# Patient Record
Sex: Male | Born: 1945 | Race: White | Hispanic: No | Smoking: Former smoker
Health system: Southern US, Community
[De-identification: ages and names within clinical notes are randomized; demographics above are authoritative.]

## PROBLEM LIST (undated history)

## (undated) DIAGNOSIS — I252 Old myocardial infarction: Secondary | ICD-10-CM

## (undated) DIAGNOSIS — M109 Gout, unspecified: Secondary | ICD-10-CM

## (undated) DIAGNOSIS — I639 Cerebral infarction, unspecified: Secondary | ICD-10-CM

## (undated) DIAGNOSIS — E785 Hyperlipidemia, unspecified: Secondary | ICD-10-CM

## (undated) DIAGNOSIS — G4730 Sleep apnea, unspecified: Secondary | ICD-10-CM

## (undated) DIAGNOSIS — N400 Enlarged prostate without lower urinary tract symptoms: Secondary | ICD-10-CM

## (undated) DIAGNOSIS — R0600 Dyspnea, unspecified: Secondary | ICD-10-CM

## (undated) DIAGNOSIS — F32A Depression, unspecified: Secondary | ICD-10-CM

## (undated) DIAGNOSIS — E538 Deficiency of other specified B group vitamins: Secondary | ICD-10-CM

## (undated) DIAGNOSIS — I509 Heart failure, unspecified: Secondary | ICD-10-CM

## (undated) DIAGNOSIS — F419 Anxiety disorder, unspecified: Secondary | ICD-10-CM

## (undated) DIAGNOSIS — I10 Essential (primary) hypertension: Secondary | ICD-10-CM

## (undated) DIAGNOSIS — C801 Malignant (primary) neoplasm, unspecified: Secondary | ICD-10-CM

## (undated) DIAGNOSIS — R361 Hematospermia: Secondary | ICD-10-CM

## (undated) DIAGNOSIS — N189 Chronic kidney disease, unspecified: Secondary | ICD-10-CM

## (undated) DIAGNOSIS — K219 Gastro-esophageal reflux disease without esophagitis: Secondary | ICD-10-CM

## (undated) DIAGNOSIS — I4891 Unspecified atrial fibrillation: Secondary | ICD-10-CM

## (undated) DIAGNOSIS — F329 Major depressive disorder, single episode, unspecified: Secondary | ICD-10-CM

## (undated) HISTORY — PX: ANGIOPLASTY: SHX39

## (undated) HISTORY — DX: Cerebral infarction, unspecified: I63.9

## (undated) HISTORY — DX: Malignant (primary) neoplasm, unspecified: C80.1

## (undated) HISTORY — DX: Hematospermia: R36.1

## (undated) HISTORY — PX: BACK SURGERY: SHX140

## (undated) HISTORY — DX: Chronic kidney disease, unspecified: N18.9

## (undated) HISTORY — PX: CHOLECYSTECTOMY: SHX55

## (undated) HISTORY — DX: Essential (primary) hypertension: I10

## (undated) HISTORY — DX: Anxiety disorder, unspecified: F41.9

## (undated) HISTORY — DX: Gout, unspecified: M10.9

## (undated) HISTORY — DX: Gastro-esophageal reflux disease without esophagitis: K21.9

## (undated) HISTORY — DX: Deficiency of other specified B group vitamins: E53.8

## (undated) HISTORY — DX: Benign prostatic hyperplasia without lower urinary tract symptoms: N40.0

## (undated) HISTORY — DX: Major depressive disorder, single episode, unspecified: F32.9

## (undated) HISTORY — DX: Depression, unspecified: F32.A

## (undated) HISTORY — DX: Unspecified atrial fibrillation: I48.91

## (undated) HISTORY — DX: Hyperlipidemia, unspecified: E78.5

## (undated) HISTORY — DX: Old myocardial infarction: I25.2

## (undated) HISTORY — DX: Sleep apnea, unspecified: G47.30

---

## 1991-12-21 DIAGNOSIS — I252 Old myocardial infarction: Secondary | ICD-10-CM

## 1991-12-21 HISTORY — DX: Old myocardial infarction: I25.2

## 2012-01-13 DIAGNOSIS — R361 Hematospermia: Secondary | ICD-10-CM

## 2012-01-13 HISTORY — DX: Hematospermia: R36.1

## 2015-02-05 HISTORY — PX: CORONARY ARTERY BYPASS GRAFT: SHX141

## 2015-02-05 HISTORY — PX: SAPHENOUS VEIN GRAFT RESECTION: SHX2374

## 2015-02-09 LAB — CBC AND DIFFERENTIAL
HCT: 24 % — AB (ref 41–53)
HEMOGLOBIN: 7.9 g/dL — AB (ref 13.5–17.5)
Platelets: 110 10*3/uL — AB (ref 150–399)
WBC: 4.4 10^3/mL

## 2015-02-09 LAB — BASIC METABOLIC PANEL
BUN: 53 mg/dL — AB (ref 4–21)
Creatinine: 1.8 mg/dL — AB (ref 0.6–1.3)
Potassium: 4.2 mmol/L (ref 3.4–5.3)
Sodium: 140 mmol/L (ref 137–147)

## 2015-02-12 LAB — BASIC METABOLIC PANEL
BUN: 40 mg/dL — AB (ref 4–21)
CREATININE: 1.7 mg/dL — AB (ref 0.6–1.3)
Potassium: 3.9 mmol/L (ref 3.4–5.3)
SODIUM: 140 mmol/L (ref 137–147)

## 2015-02-12 LAB — CBC AND DIFFERENTIAL
HCT: 28 % — AB (ref 41–53)
Hemoglobin: 9.1 g/dL — AB (ref 13.5–17.5)
Platelets: 139 10*3/uL — AB (ref 150–399)
WBC: 5.2 10*3/mL

## 2015-02-12 LAB — POCT INR: INR: 1.1 (ref <=1.1)

## 2015-02-13 ENCOUNTER — Non-Acute Institutional Stay (SKILLED_NURSING_FACILITY): Payer: Medicare Other | Admitting: Internal Medicine

## 2015-02-13 DIAGNOSIS — I25708 Atherosclerosis of coronary artery bypass graft(s), unspecified, with other forms of angina pectoris: Secondary | ICD-10-CM | POA: Diagnosis not present

## 2015-02-13 DIAGNOSIS — I10 Essential (primary) hypertension: Secondary | ICD-10-CM

## 2015-02-13 DIAGNOSIS — N401 Enlarged prostate with lower urinary tract symptoms: Secondary | ICD-10-CM

## 2015-02-13 DIAGNOSIS — I214 Non-ST elevation (NSTEMI) myocardial infarction: Secondary | ICD-10-CM

## 2015-02-13 DIAGNOSIS — R338 Other retention of urine: Secondary | ICD-10-CM

## 2015-02-13 DIAGNOSIS — M1A00X0 Idiopathic chronic gout, unspecified site, without tophus (tophi): Secondary | ICD-10-CM | POA: Diagnosis not present

## 2015-02-13 DIAGNOSIS — E538 Deficiency of other specified B group vitamins: Secondary | ICD-10-CM | POA: Diagnosis not present

## 2015-02-13 DIAGNOSIS — I480 Paroxysmal atrial fibrillation: Secondary | ICD-10-CM | POA: Diagnosis not present

## 2015-02-13 DIAGNOSIS — Z85528 Personal history of other malignant neoplasm of kidney: Secondary | ICD-10-CM

## 2015-02-13 DIAGNOSIS — Z8673 Personal history of transient ischemic attack (TIA), and cerebral infarction without residual deficits: Secondary | ICD-10-CM | POA: Diagnosis not present

## 2015-02-13 DIAGNOSIS — N2889 Other specified disorders of kidney and ureter: Secondary | ICD-10-CM

## 2015-02-13 DIAGNOSIS — K219 Gastro-esophageal reflux disease without esophagitis: Secondary | ICD-10-CM

## 2015-02-13 DIAGNOSIS — F3340 Major depressive disorder, recurrent, in remission, unspecified: Secondary | ICD-10-CM | POA: Diagnosis not present

## 2015-02-13 DIAGNOSIS — N189 Chronic kidney disease, unspecified: Secondary | ICD-10-CM

## 2015-02-13 NOTE — Progress Notes (Signed)
Patient ID: Alexander Austin, male   DOB: 1946-08-13, 69 y.o.   MRN: [id number]    HISTORY AND PHYSICAL  Location:  [place] of Service: SNF (31)   No emergency contact information on file.  Advanced Directive information  DNR; COMFORT  Chief Complaint  Patient presents with  . New Admit To SNF    CAD s/p 3 vessel CABG, NSTEMI, hx CVA, CKD, hx right RCC s/p partial nephrectomy, B12 deficiency, PAF, urinary retention    HPI:  69 yo male seen today as a new admission for above. hospital records reviewed. He had a NSTEMI and left heart cath done which showed occlusion at proximal LAD, OM lesion and occluded RCA with occluded SVG RCA graft. He underwent a 3 vessel CABG. He has a prior hx partial right nephrectomy due to [place] and experienced urinary retention this admission. Foley cath inserted and he was told that he needed to f/u with urology after d/c.  Today, he has no c/o. Denies CP, SOB or palpitations. No new swelling in extremities. No d/c from sternal incision. He has began PT/OT and is tolerating tx. Appetite is excellent and he is sleeping well.  No nursing issues.  He has a hx CVA  and afib and is taking warfarin along with amiodarone, lipitor, and lisinopril. Edema is controlled with furosemide.  He takes B12 for hx B12 deficiency.  Depression is controlled on sertraline.  BPH is controlled on flomax    Past Medical History  Diagnosis Date  . Depression   . Anxiety   . Hypertension   . Gout   . A-fib   . GERD (gastroesophageal reflux disease)   . Hyperlipidemia   . Chronic kidney disease   . Cancer     Kidney  . CVA (cerebral infarction)   . Stroke     Past Surgical History  Procedure Laterality Date  . Coronary artery bypass graft  02/05/2015  . Back surgery    . Angioplasty      X 8   . Saphenous vein graft resection  02/05/15   Family History  Problem Relation Age of Onset  . Hypertension Mother   . Heart disease Mother    . Stroke Mother   . Hypertension Father      No care team member to display  History   Social History  . Marital Status: Married    Spouse Name: N/A  . Number of Children: N/A  . Years of Education: N/A   Social History Main Topics  . Smoking status: Former Smoker    Quit date: 07/24/1982  . Smokeless tobacco: Not on file  . Alcohol Use: No  . Drug Use: No  . Sexual Activity: No   Other Topics Concern  . Not on file   Past medical, surgical, family and social history reviewed   There is no immunization history on file for this patient.  Allergies  Allergen Reactions  . Imdur [Isosorbide Dinitrate] Other (See Comments)    Cardiovascular Arrest    Medications: Patient's Medications  New Prescriptions   No medications on file  Previous Medications   ALLOPURINOL (ZYLOPRIM) 100 MG TABLET    Take 100 mg by mouth daily.   AMIODARONE (PACERONE) 400 MG TABLET    Take 200 mg by mouth 2 (two) times daily. [name]   AMITRIPTYLINE (ELAVIL) 25 MG TABLET    Take 25 mg by mouth at bedtime.   AMLODIPINE (NORVASC) 10 MG  TABLET    Take 10 mg by mouth daily.   ASPIRIN 325 MG EC TABLET    Take 325 mg by mouth daily.   ATORVASTATIN (LIPITOR) 80 MG TABLET    Take 80 mg by mouth daily.   COLCHICINE 0.6 MG TABLET    Take 2 tablets once. Take 1 tablet 1 hour later, do not take more than 3 tablets in 3 days.   FUROSEMIDE (LASIX) 20 MG TABLET    Take 20 mg by mouth daily.   LISINOPRIL (PRINIVIL,ZESTRIL) 2.5 MG TABLET    Take 2.5 mg by mouth daily.   LOPERAMIDE (IMODIUM) 2 MG CAPSULE    Take 2 mg by mouth 3 (three) times daily.   METOPROLOL TARTRATE (LOPRESSOR) 25 MG TABLET    Take 1.5 tablets(37.5 mg total) by mouth every 6 hours.   PRIMIDONE (MYSOLINE) 50 MG TABLET    Take 100 mg by mouth 2 (two) times daily.   RANITIDINE (ZANTAC) 150 MG TABLET    Take 150 mg by mouth daily.   SACCHAROMYCES BOULARDII (FLORASTOR) 250 MG CAPSULE    Take 250 mg by mouth 2 (two) times daily.   SENNA-DOCUSATE  (SENOKOT-S) 8.6-50 MG PER TABLET    Take 2 tablets by mouth at bedtime as needed for mild constipation.   SERTRALINE (ZOLOFT) 100 MG TABLET    Take 150 mg by mouth daily.   TAMSULOSIN (FLOMAX) 0.4 MG CAPS CAPSULE    Take 0.8 mg by mouth daily after supper.   VITAMIN B-12 (CYANOCOBALAMIN) 500 MCG TABLET    Take 500 mcg by mouth daily.   WARFARIN (COUMADIN) 5 MG TABLET    Take 5 mg by mouth daily at 6 PM. Adjust dose to as directed to keep INR 2-3.  Modified Medications   No medications on file  Discontinued Medications   No medications on file    Review of Systems  Constitutional: Negative for chills, activity change and fatigue.  HENT: Negative for sore throat and trouble swallowing.   Eyes: Negative for visual disturbance.  Respiratory: Negative for cough, chest tightness and shortness of breath.   Cardiovascular: Negative for chest pain, palpitations and leg swelling.  Gastrointestinal: Negative for nausea, vomiting, abdominal pain and blood in stool.  Genitourinary: Negative for urgency, frequency and difficulty urinating.  Musculoskeletal: Negative for arthralgias and gait problem.  Skin: Negative for rash.  Neurological: Negative for weakness and headaches.  Psychiatric/Behavioral: Positive for dysphoric mood. Negative for confusion and sleep disturbance. The patient is not nervous/anxious.     Filed Vitals:   02/13/15 1636  BP: 145/58  Pulse: 71  Temp: 97.8 F (36.6 C)  SpO2: 97%   There is no height or weight on file to calculate BMI.  Physical Exam  Constitutional: He is oriented to person, place, and time. He appears well-developed and well-nourished.  Awake and alert in NAD  HENT:  Mouth/Throat: Oropharynx is clear and moist.  Eyes: Pupils are equal, round, and reactive to light. No scleral icterus.  Neck: Neck supple.  Cardiovascular: Normal rate, regular rhythm, normal heart sounds and intact distal pulses.  Exam reveals no gallop and no friction rub.   No  murmur heard. No carotid bruit b/l; no distal LE swelling   Pulmonary/Chest: Effort normal and breath sounds normal. He has no wheezes. He has no rales. He exhibits no tenderness.  ACW incision intact without secondary signs of infection   Abdominal: Soft. Bowel sounds are normal. He exhibits no distension, no abdominal bruit,  no pulsatile midline mass and no mass. There is no tenderness. There is no rebound and no guarding.  Genitourinary:  Foley cath intact with min particulates but otherwise yellow and DTG. No gross blood  Lymphadenopathy:    He has no cervical adenopathy.  Neurological: He is alert and oriented to person, place, and time. He has normal reflexes.  Skin: Skin is warm and dry. No rash noted.  Psychiatric: He has a normal mood and affect. His behavior is normal. Judgment and thought content normal.     Labs reviewed: CBC Latest Ref Rng 02/12/2015 02/09/2015  WBC - 5.2 4.4  Hemoglobin 13.5 - 17.5 g/dL 9.1(A) 7.9(A)  Hematocrit 41 - 53 % 28(A) 24(A)  Platelets 150 - 399 K/L 139(A) 110(A)    BMP Latest Ref Rng 02/12/2015 02/09/2015  BUN 4 - 21 mg/dL 40(A) 53(A)  Creatinine 0.6 - 1.3 mg/dL 1.7(A) 1.8(A)  Sodium 137 - 147 mmol/L 140 140  Potassium 3.4 - 5.3 mmol/L 3.9 4.2    INR 1.09  Mg 2.5   Hospital records reviewed- on 2/17th he was taken to the OR for sternotomy redo with 3 vessel CABG. Post op complicated by afib with RVR which resolved after amiodarone bolus and lopressor. HR returned to NSR. Peak Cr 3.01 but was 1.69 at d/c. Urinary retention of >500cc by bladder scan and foley cath inserted. He was d/c'd on flomax  Assessment/Plan   ICD-9-CM ICD-10-CM   1. Urinary retention due to benign prostatic hyperplasia - foley intact; on flomax 600.01 N28.89    788.29 R33.8   2. 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 - on ASA, lisinopril, metoprolol, lipitor 414.05 I25.708    413.9     s/p CABG x  3 vessel  3. NSTEMI (non-ST elevated myocardial infarction) - resolved 410.70 I21.4   4. PAF (paroxysmal atrial fibrillation) - rate controlled on metoprolol and amiodarone, coumadin for anticoagulation 427.31 I48.0   5. CKD (chronic kidney disease), unspecified stage - stable 585.9 N18.9   6. History of renal cell cancer V10.52 Z85.528    s/p partial nephrectomy  7. Vitamin B12 deficiency on oral supplement 266.2 E53.8   8. Major depressive disorder, recurrent, in remission - stable on sertraline 296.35 F33.40   9. History of stroke - on statin, ASA and coumadin V12.54 Z86.73   10. Gastroesophageal reflux disease without esophagitis - stable on zantac 530.81 K21.9   11. Essential hypertension, benign - controlled on amlodipine, lisinopril, metoprolol, furosemide 401.1 I10   12. Idiopathic chronic gout without tophus, unspecified site - stable on allopurinol and prn cochicine 274.02 [id number].[id number]     --Urology eval for foley cath, BPH  --f/u with cardiology and CT sx  --continue current medications  --follow PT/INR. Next draw today. GOAL INR 2-3. --PT/OT/ST as indicated  --GOAL: short term rehab and d/c home when medically appropriate. Communicated with pt and nursing.   Corey Laski S. Perlie Gold  [facility] [address] [place], [place] [address] [phone] Office (Wednesdays and Fridays 8 AM - 5 PM) [phone] Cell (Monday-Friday 8 AM - 5 PM)

## 2015-02-14 ENCOUNTER — Encounter: Payer: Self-pay | Admitting: *Deleted

## 2015-03-06 DIAGNOSIS — M1A9XX0 Chronic gout, unspecified, without tophus (tophi): Secondary | ICD-10-CM | POA: Insufficient documentation

## 2015-03-06 DIAGNOSIS — E538 Deficiency of other specified B group vitamins: Secondary | ICD-10-CM | POA: Insufficient documentation

## 2015-03-06 DIAGNOSIS — M109 Gout, unspecified: Secondary | ICD-10-CM | POA: Insufficient documentation

## 2015-03-06 DIAGNOSIS — I25708 Atherosclerosis of coronary artery bypass graft(s), unspecified, with other forms of angina pectoris: Secondary | ICD-10-CM | POA: Insufficient documentation

## 2015-03-06 DIAGNOSIS — Z8673 Personal history of transient ischemic attack (TIA), and cerebral infarction without residual deficits: Secondary | ICD-10-CM | POA: Insufficient documentation

## 2015-03-06 DIAGNOSIS — Z85528 Personal history of other malignant neoplasm of kidney: Secondary | ICD-10-CM | POA: Insufficient documentation

## 2015-03-06 DIAGNOSIS — F3340 Major depressive disorder, recurrent, in remission, unspecified: Secondary | ICD-10-CM | POA: Insufficient documentation

## 2015-03-06 DIAGNOSIS — I10 Essential (primary) hypertension: Secondary | ICD-10-CM | POA: Insufficient documentation

## 2015-03-06 DIAGNOSIS — R339 Retention of urine, unspecified: Secondary | ICD-10-CM | POA: Insufficient documentation

## 2015-03-06 DIAGNOSIS — I214 Non-ST elevation (NSTEMI) myocardial infarction: Secondary | ICD-10-CM | POA: Insufficient documentation

## 2015-03-06 DIAGNOSIS — N189 Chronic kidney disease, unspecified: Secondary | ICD-10-CM | POA: Insufficient documentation

## 2015-03-06 DIAGNOSIS — K219 Gastro-esophageal reflux disease without esophagitis: Secondary | ICD-10-CM | POA: Insufficient documentation

## 2015-03-06 DIAGNOSIS — I480 Paroxysmal atrial fibrillation: Secondary | ICD-10-CM | POA: Insufficient documentation

## 2015-03-11 ENCOUNTER — Encounter: Payer: Self-pay | Admitting: *Deleted

## 2015-03-13 ENCOUNTER — Non-Acute Institutional Stay (SKILLED_NURSING_FACILITY): Payer: Medicare Other | Admitting: Adult Health

## 2015-03-13 DIAGNOSIS — M1A00X0 Idiopathic chronic gout, unspecified site, without tophus (tophi): Secondary | ICD-10-CM | POA: Diagnosis not present

## 2015-03-13 DIAGNOSIS — Z7901 Long term (current) use of anticoagulants: Secondary | ICD-10-CM | POA: Diagnosis not present

## 2015-03-13 DIAGNOSIS — I10 Essential (primary) hypertension: Secondary | ICD-10-CM

## 2015-03-13 DIAGNOSIS — I25708 Atherosclerosis of coronary artery bypass graft(s), unspecified, with other forms of angina pectoris: Secondary | ICD-10-CM | POA: Diagnosis not present

## 2015-03-13 DIAGNOSIS — R251 Tremor, unspecified: Secondary | ICD-10-CM

## 2015-03-13 DIAGNOSIS — N400 Enlarged prostate without lower urinary tract symptoms: Secondary | ICD-10-CM | POA: Diagnosis not present

## 2015-03-13 DIAGNOSIS — R609 Edema, unspecified: Secondary | ICD-10-CM | POA: Diagnosis not present

## 2015-03-13 DIAGNOSIS — F3340 Major depressive disorder, recurrent, in remission, unspecified: Secondary | ICD-10-CM | POA: Diagnosis not present

## 2015-03-13 DIAGNOSIS — I480 Paroxysmal atrial fibrillation: Secondary | ICD-10-CM

## 2015-03-13 DIAGNOSIS — K219 Gastro-esophageal reflux disease without esophagitis: Secondary | ICD-10-CM

## 2015-03-19 ENCOUNTER — Encounter: Payer: Self-pay | Admitting: Internal Medicine

## 2015-03-20 ENCOUNTER — Non-Acute Institutional Stay (SKILLED_NURSING_FACILITY): Payer: Medicare Other | Admitting: Adult Health

## 2015-03-20 DIAGNOSIS — I214 Non-ST elevation (NSTEMI) myocardial infarction: Secondary | ICD-10-CM

## 2015-03-20 DIAGNOSIS — I25708 Atherosclerosis of coronary artery bypass graft(s), unspecified, with other forms of angina pectoris: Secondary | ICD-10-CM

## 2015-03-20 DIAGNOSIS — I10 Essential (primary) hypertension: Secondary | ICD-10-CM | POA: Diagnosis not present

## 2015-03-20 DIAGNOSIS — N400 Enlarged prostate without lower urinary tract symptoms: Secondary | ICD-10-CM | POA: Diagnosis not present

## 2015-03-23 ENCOUNTER — Other Ambulatory Visit: Admit: 2015-03-23 | Disposition: A | Discharge status: Home / Self Care | Payer: Self-pay

## 2015-03-23 LAB — CLOSTRIDIUM DIFFICILE(ARMC)

## 2015-04-16 DIAGNOSIS — Z7901 Long term (current) use of anticoagulants: Secondary | ICD-10-CM | POA: Insufficient documentation

## 2015-04-16 DIAGNOSIS — R609 Edema, unspecified: Secondary | ICD-10-CM | POA: Insufficient documentation

## 2015-04-16 DIAGNOSIS — N400 Enlarged prostate without lower urinary tract symptoms: Secondary | ICD-10-CM | POA: Insufficient documentation

## 2015-04-16 DIAGNOSIS — R251 Tremor, unspecified: Secondary | ICD-10-CM | POA: Insufficient documentation

## 2015-04-16 NOTE — Progress Notes (Signed)
Patient ID: Alexander Austin, male   DOB: 06/01/1946, 69 y.o.   MRN: [id number]  [facility]     Allergies  Allergen Reactions  . Imdur [Isosorbide Dinitrate] Other (See Comments)    Cardiovascular Arrest       Chief Complaint  Patient presents with  . Medical Management of Chronic Issues    HPI:  He is a long term resident of this facility being seen for the management of his chronic illnesses. He status is stable overall he is not voicing any concerns or complaints at this time. There are no nursing concerns at this time.    Past Medical History  Diagnosis Date  . Depression   . Anxiety   . Hypertension   . Gout   . A-fib   . GERD (gastroesophageal reflux disease)   . Hyperlipidemia   . Chronic kidney disease   . Cancer     Kidney  . CVA (cerebral infarction)   . Stroke   . History of myocardial infarction 1993  . Benign prostate hyperplasia   . Vitamin B 12 deficiency   . Hematospermia 01/13/2012  . Sleep apnea     Past Surgical History  Procedure Laterality Date  . Coronary artery bypass graft  02/05/2015  . Back surgery    . Angioplasty      X 8   . Saphenous vein graft resection  02/05/15    VITAL SIGNS BP 144/75 mmHg  Pulse 62  Ht 6\' 1"  (1.854 m)  Wt 215 lb (97.523 kg)  BMI 28.37 kg/m2   Outpatient Encounter Prescriptions as of 03/13/2015  Medication Sig  . allopurinol (ZYLOPRIM) 100 MG tablet Take 100 mg by mouth daily.  [name] amiodarone (PACERONE) 400 MG tablet Take 200 mg by mouth 2 (two) times daily. .  . amitriptyline (ELAVIL) 25 MG tablet Take 25 mg by mouth at bedtime.  [name] amLODipine (NORVASC) 10 MG tablet Take 10 mg by mouth daily.  [name] aspirin 325 MG EC tablet Take 325 mg by mouth daily.  [name] atorvastatin (LIPITOR) 80 MG tablet Take 80 mg by mouth daily.  . colchicine 0.6 MG tablet Take 2 tablets once. Take 1 tablet 1 hour later, do not take more than 3 tablets in 3 days.  . furosemide (LASIX) 20 MG tablet Take 20 mg by mouth daily.  [name] lisinopril  (PRINIVIL,ZESTRIL) 2.5 MG tablet Take 2.5 mg by mouth daily.  [name] loperamide (IMODIUM) 2 MG capsule Take 2 mg by mouth 3 (three) times daily.  . metoprolol tartrate (LOPRESSOR) 25 MG tablet Take 1.5 tablets(37.5 mg total) by mouth every 6 hours.  . primidone (MYSOLINE) 50 MG tablet Take 100 mg by mouth 2 (two) times daily.  . ranitidine (ZANTAC) 150 MG tablet Take 150 mg by mouth daily.  [name] saccharomyces boulardii (FLORASTOR) 250 MG capsule Take 250 mg by mouth 2 (two) times daily.  [name] senna-docusate (SENOKOT-S) 8.6-50 MG per tablet Take 2 tablets by mouth at bedtime as needed for mild constipation.  . sertraline (ZOLOFT) 100 MG tablet Take 150 mg by mouth daily.  . tamsulosin (FLOMAX) 0.4 MG CAPS capsule Take 0.8 mg by mouth daily after supper.  . vitamin B-12 (CYANOCOBALAMIN) 500 MCG tablet Take 500 mcg by mouth daily.  [name] warfarin (COUMADIN) 5 MG tablet Take 6 mg by mouth daily at 6 PM. Adjust dose to as directed to keep INR 2-3.     SIGNIFICANT DIAGNOSTIC EXAMS    Review of Systems  Constitutional: Negative for  malaise/fatigue.  Respiratory: Negative for cough and shortness of breath.   Cardiovascular: Negative for chest pain, palpitations and leg swelling.  Gastrointestinal: Negative for heartburn, abdominal pain and constipation.  Genitourinary:       His foley has been removed   Musculoskeletal: Negative for myalgias and joint pain.  Skin: Negative.   Psychiatric/Behavioral: Negative for depression. The patient is not nervous/anxious.      Physical Exam  Constitutional: He is oriented to person, place, and time. He appears well-developed and well-nourished. No distress.  Neck: Neck supple. No JVD present. No thyromegaly present.  Cardiovascular: Normal rate, regular rhythm and intact distal pulses.   Respiratory: Effort normal and breath sounds normal. No respiratory distress.  GI: Soft. Bowel sounds are normal. He exhibits no distension. There is no tenderness.    Musculoskeletal: He exhibits no edema.  Is able to move extremities   Neurological: He is alert and oriented to person, place, and time.  Skin: Skin is warm and dry. He is not diaphoretic.       ASSESSMENT/ PLAN:  1. Afib: his heart rate is stable; is on amiodarone 200 mg daily for rate control; is on lopressor 37.5 mg every 6 hours for rate control;  And asa 325 mg daily   2. Anticoagulation management: for his inr of 3.7 will hold coumadin for 2 days then begin coumadin 5 mg daily and will check inr on 03-17-15.   3. Hypertension: will continue norvasc 10 mg daily; lopressor 37.5 mg every 6 hours; lisinopril 2.5 mg daily   4. Dyslipidemia: will continue lipitor 80 mg daily  5. Gout: no recent flares; will continue allopurinol 100 mg daily; and has colchicine 0.6 mg 2 tabs then 1 tab one hour later as needed  6. Gerd: will continue zantac 150 mg daily does take imodium 2 mg prior to meals   7. Depression: is stable at this time; will continue zoloft 150 mg daily   8. BPH: his foley has removed is voiding; will continue flomax 0.8 mg daily  9. Edema: will continue lasix 20 mg daily   10. Tremor: will continue primidone 100 mg twice daily   11. CAD: no complaints of chest pain present; is status post MI; will continue asa 325 mg daily     Time spent with patient 45 minutes.     Ok Edwards NP [facility]  Contact [phone] Monday through Friday 8am- 5pm  After hours call [phone]

## 2015-05-15 NOTE — Progress Notes (Signed)
Patient ID: YURIY BROSEY, male   DOB: 1945-12-30, 69 y.o.   MRN: [id number]  [facility]     Allergies  Allergen Reactions  . Imdur [Isosorbide Dinitrate] Other (See Comments)    Cardiovascular Arrest       Chief Complaint  Patient presents with  . Discharge Note    HPI:  He is being discharged to another snf. He will not need any home health; or dme. He will not require any prescriptions to be written. He will be followed medically by the medical staff at the facility.   Past Medical History  Diagnosis Date  . Depression   . Anxiety   . Hypertension   . Gout   . A-fib   . GERD (gastroesophageal reflux disease)   . Hyperlipidemia   . Chronic kidney disease   . Cancer     Kidney  . CVA (cerebral infarction)   . Stroke   . History of myocardial infarction 1993  . Benign prostate hyperplasia   . Vitamin B 12 deficiency   . Hematospermia 01/13/2012  . Sleep apnea     Past Surgical History  Procedure Laterality Date  . Coronary artery bypass graft  02/05/2015  . Back surgery    . Angioplasty      X 8   . Saphenous vein graft resection  02/05/15    VITAL SIGNS BP 130/72 mmHg  Pulse 60  Ht 6\' 1"  (1.854 m)  Wt 212 lb (96.163 kg)  BMI 27.98 kg/m2  SpO2 98%   Outpatient Encounter Prescriptions as of 03/20/2015  Medication Sig  . allopurinol (ZYLOPRIM) 100 MG tablet Take 100 mg by mouth daily.  [name] amiodarone (PACERONE) 400 MG tablet Take 200 mg by mouth 2 (two) times daily. .  . amitriptyline (ELAVIL) 25 MG tablet Take 25 mg by mouth at bedtime.  [name] amLODipine (NORVASC) 10 MG tablet Take 10 mg by mouth daily.  [name] aspirin 325 MG EC tablet Take 325 mg by mouth daily.  [name] atorvastatin (LIPITOR) 80 MG tablet Take 80 mg by mouth daily.  . colchicine 0.6 MG tablet Take 2 tablets once. Take 1 tablet 1 hour later, do not take more than 3 tablets in 3 days.  . furosemide (LASIX) 20 MG tablet Take 20 mg by mouth daily.  [name] lisinopril (PRINIVIL,ZESTRIL) 2.5 MG tablet Take 2.5  mg by mouth daily.  [name] loperamide (IMODIUM) 2 MG capsule Take 2 mg by mouth 3 (three) times daily.  . metoprolol tartrate (LOPRESSOR) 25 MG tablet Take 1.5 tablets(37.5 mg total) by mouth every 6 hours.  . primidone (MYSOLINE) 50 MG tablet Take 100 mg by mouth 2 (two) times daily.  . ranitidine (ZANTAC) 150 MG tablet Take 150 mg by mouth daily.  [name] saccharomyces boulardii (FLORASTOR) 250 MG capsule Take 250 mg by mouth 2 (two) times daily.  [name] senna-docusate (SENOKOT-S) 8.6-50 MG per tablet Take 2 tablets by mouth at bedtime as needed for mild constipation.  . sertraline (ZOLOFT) 100 MG tablet Take 150 mg by mouth daily.  . tamsulosin (FLOMAX) 0.4 MG CAPS capsule Take 0.8 mg by mouth daily after supper.  . vitamin B-12 (CYANOCOBALAMIN) 500 MCG tablet Take 500 mcg by mouth daily.  [name] warfarin (COUMADIN) 5 MG tablet Take 5 mg by mouth daily at 6 PM. Adjust dose to as directed to keep INR 2-3.      SIGNIFICANT DIAGNOSTIC EXAMS        ROS Constitutional: Negative for malaise/fatigue.  Respiratory: Negative  for cough and shortness of breath.   Cardiovascular: Negative for chest pain, palpitations and leg swelling.  Gastrointestinal: Negative for heartburn, abdominal pain and constipation.  Musculoskeletal: Negative for myalgias and joint pain.  Skin: Negative.   Psychiatric/Behavioral: Negative for depression. The patient is not nervous/anxious.      Physical Exam Constitutional: He is oriented to person, place, and time. He appears well-developed and well-nourished. No distress.  Neck: Neck supple. No JVD present. No thyromegaly present.  Cardiovascular: Normal rate, regular rhythm and intact distal pulses.   Respiratory: Effort normal and breath sounds normal. No respiratory distress.  GI: Soft. Bowel sounds are normal. He exhibits no distension. There is no tenderness.  Musculoskeletal: He exhibits no edema.  Is able to move extremities   Neurological: He is alert and oriented to  person, place, and time.  Skin: Skin is warm and dry. He is not diaphoretic.      ASSESSMENT/ PLAN:  Will discharge him to snf; he will not need home health; will not need dme. He will not require any prescriptions to be written.   Time spent with patient 15 minutes.    Ok Edwards NP [facility]  Contact [phone] Monday through Friday 8am- 5pm  After hours call [phone]

## 2018-06-28 ENCOUNTER — Telehealth: Payer: Self-pay

## 2018-06-28 NOTE — Telephone Encounter (Signed)
Pt appears on [facility] Quality Report for [facility].  I called the pt and left a message asking for a call back to [phone] confirm their PCP.  VDM (DD)

## 2018-10-23 ENCOUNTER — Telehealth: Payer: Self-pay

## 2018-10-23 NOTE — Telephone Encounter (Signed)
error 

## 2020-03-07 ENCOUNTER — Other Ambulatory Visit: Payer: Self-pay

## 2020-03-07 ENCOUNTER — Encounter (HOSPITAL_BASED_OUTPATIENT_CLINIC_OR_DEPARTMENT_OTHER): Payer: Self-pay | Admitting: Emergency Medicine

## 2020-03-07 ENCOUNTER — Emergency Department (HOSPITAL_BASED_OUTPATIENT_CLINIC_OR_DEPARTMENT_OTHER): Payer: No Typology Code available for payment source

## 2020-03-07 ENCOUNTER — Inpatient Hospital Stay (HOSPITAL_BASED_OUTPATIENT_CLINIC_OR_DEPARTMENT_OTHER)
Admission: EM | Admit: 2020-03-07 | Discharge: 2020-03-10 | DRG: 554 | Disposition: A | Discharge status: Home / Self Care | Payer: No Typology Code available for payment source | Attending: Internal Medicine | Admitting: Internal Medicine

## 2020-03-07 DIAGNOSIS — N289 Disorder of kidney and ureter, unspecified: Secondary | ICD-10-CM

## 2020-03-07 DIAGNOSIS — N400 Enlarged prostate without lower urinary tract symptoms: Secondary | ICD-10-CM | POA: Diagnosis present

## 2020-03-07 DIAGNOSIS — R251 Tremor, unspecified: Secondary | ICD-10-CM | POA: Diagnosis not present

## 2020-03-07 DIAGNOSIS — Z6830 Body mass index (BMI) 30.0-30.9, adult: Secondary | ICD-10-CM

## 2020-03-07 DIAGNOSIS — M10272 Drug-induced gout, left ankle and foot: Secondary | ICD-10-CM | POA: Diagnosis not present

## 2020-03-07 DIAGNOSIS — I255 Ischemic cardiomyopathy: Secondary | ICD-10-CM | POA: Diagnosis present

## 2020-03-07 DIAGNOSIS — N189 Chronic kidney disease, unspecified: Secondary | ICD-10-CM

## 2020-03-07 DIAGNOSIS — N179 Acute kidney failure, unspecified: Secondary | ICD-10-CM | POA: Diagnosis present

## 2020-03-07 DIAGNOSIS — R531 Weakness: Secondary | ICD-10-CM

## 2020-03-07 DIAGNOSIS — E538 Deficiency of other specified B group vitamins: Secondary | ICD-10-CM | POA: Diagnosis not present

## 2020-03-07 DIAGNOSIS — I25810 Atherosclerosis of coronary artery bypass graft(s) without angina pectoris: Secondary | ICD-10-CM | POA: Diagnosis not present

## 2020-03-07 DIAGNOSIS — I252 Old myocardial infarction: Secondary | ICD-10-CM

## 2020-03-07 DIAGNOSIS — E669 Obesity, unspecified: Secondary | ICD-10-CM | POA: Diagnosis not present

## 2020-03-07 DIAGNOSIS — G4733 Obstructive sleep apnea (adult) (pediatric): Secondary | ICD-10-CM | POA: Diagnosis not present

## 2020-03-07 DIAGNOSIS — N184 Chronic kidney disease, stage 4 (severe): Secondary | ICD-10-CM | POA: Diagnosis present

## 2020-03-07 DIAGNOSIS — Z7902 Long term (current) use of antithrombotics/antiplatelets: Secondary | ICD-10-CM

## 2020-03-07 DIAGNOSIS — M7732 Calcaneal spur, left foot: Secondary | ICD-10-CM | POA: Diagnosis present

## 2020-03-07 DIAGNOSIS — R0602 Shortness of breath: Secondary | ICD-10-CM | POA: Diagnosis not present

## 2020-03-07 DIAGNOSIS — N1831 Chronic kidney disease, stage 3a: Secondary | ICD-10-CM

## 2020-03-07 DIAGNOSIS — F3340 Major depressive disorder, recurrent, in remission, unspecified: Secondary | ICD-10-CM | POA: Diagnosis not present

## 2020-03-07 DIAGNOSIS — K219 Gastro-esophageal reflux disease without esophagitis: Secondary | ICD-10-CM | POA: Diagnosis present

## 2020-03-07 DIAGNOSIS — M10072 Idiopathic gout, left ankle and foot: Secondary | ICD-10-CM | POA: Diagnosis not present

## 2020-03-07 DIAGNOSIS — E869 Volume depletion, unspecified: Secondary | ICD-10-CM | POA: Diagnosis present

## 2020-03-07 DIAGNOSIS — I480 Paroxysmal atrial fibrillation: Secondary | ICD-10-CM | POA: Diagnosis not present

## 2020-03-07 DIAGNOSIS — I10 Essential (primary) hypertension: Secondary | ICD-10-CM | POA: Diagnosis present

## 2020-03-07 DIAGNOSIS — Z20822 Contact with and (suspected) exposure to covid-19: Secondary | ICD-10-CM | POA: Diagnosis present

## 2020-03-07 DIAGNOSIS — Z8673 Personal history of transient ischemic attack (TIA), and cerebral infarction without residual deficits: Secondary | ICD-10-CM

## 2020-03-07 DIAGNOSIS — E785 Hyperlipidemia, unspecified: Secondary | ICD-10-CM | POA: Diagnosis not present

## 2020-03-07 DIAGNOSIS — M109 Gout, unspecified: Principal | ICD-10-CM | POA: Diagnosis present

## 2020-03-07 DIAGNOSIS — I129 Hypertensive chronic kidney disease with stage 1 through stage 4 chronic kidney disease, or unspecified chronic kidney disease: Secondary | ICD-10-CM | POA: Diagnosis not present

## 2020-03-07 DIAGNOSIS — Z789 Other specified health status: Secondary | ICD-10-CM

## 2020-03-07 DIAGNOSIS — Z7901 Long term (current) use of anticoagulants: Secondary | ICD-10-CM

## 2020-03-07 DIAGNOSIS — Z8249 Family history of ischemic heart disease and other diseases of the circulatory system: Secondary | ICD-10-CM

## 2020-03-07 DIAGNOSIS — Z823 Family history of stroke: Secondary | ICD-10-CM

## 2020-03-07 DIAGNOSIS — Z85528 Personal history of other malignant neoplasm of kidney: Secondary | ICD-10-CM | POA: Diagnosis not present

## 2020-03-07 DIAGNOSIS — E66811 Obesity, class 1: Secondary | ICD-10-CM

## 2020-03-07 DIAGNOSIS — I2510 Atherosclerotic heart disease of native coronary artery without angina pectoris: Secondary | ICD-10-CM | POA: Diagnosis present

## 2020-03-07 DIAGNOSIS — M79673 Pain in unspecified foot: Secondary | ICD-10-CM

## 2020-03-07 DIAGNOSIS — Z888 Allergy status to other drugs, medicaments and biological substances status: Secondary | ICD-10-CM

## 2020-03-07 DIAGNOSIS — I25708 Atherosclerosis of coronary artery bypass graft(s), unspecified, with other forms of angina pectoris: Secondary | ICD-10-CM

## 2020-03-07 DIAGNOSIS — F419 Anxiety disorder, unspecified: Secondary | ICD-10-CM | POA: Diagnosis present

## 2020-03-07 DIAGNOSIS — Z87891 Personal history of nicotine dependence: Secondary | ICD-10-CM

## 2020-03-07 LAB — CBC WITH DIFFERENTIAL/PLATELET
Abs Immature Granulocytes: 0.02 10*3/uL (ref 0.00–0.07)
Basophils Absolute: 0 10*3/uL (ref 0.0–0.1)
Basophils Relative: 0 %
Eosinophils Absolute: 0.1 10*3/uL (ref 0.0–0.5)
Eosinophils Relative: 1 %
HCT: 47.3 % (ref 39.0–52.0)
Hemoglobin: 15.7 g/dL (ref 13.0–17.0)
Immature Granulocytes: 0 %
Lymphocytes Relative: 11 %
Lymphs Abs: 0.6 10*3/uL — ABNORMAL LOW (ref 0.7–4.0)
MCH: 29 pg (ref 26.0–34.0)
MCHC: 33.2 g/dL (ref 30.0–36.0)
MCV: 87.4 fL (ref 80.0–100.0)
Monocytes Absolute: 0.3 10*3/uL (ref 0.1–1.0)
Monocytes Relative: 6 %
Neutro Abs: 4.4 10*3/uL (ref 1.7–7.7)
Neutrophils Relative %: 82 %
Platelets: 150 10*3/uL (ref 150–400)
RBC: 5.41 MIL/uL (ref 4.22–5.81)
RDW: 15.8 % — ABNORMAL HIGH (ref 11.5–15.5)
WBC: 5.4 10*3/uL (ref 4.0–10.5)
nRBC: 0 % (ref 0.0–0.2)

## 2020-03-07 LAB — URINALYSIS, MICROSCOPIC (REFLEX)

## 2020-03-07 LAB — BASIC METABOLIC PANEL
Anion gap: 10 (ref 5–15)
BUN: 34 mg/dL — ABNORMAL HIGH (ref 8–23)
CO2: 23 mmol/L (ref 22–32)
Calcium: 8.9 mg/dL (ref 8.9–10.3)
Chloride: 104 mmol/L (ref 98–111)
Creatinine, Ser: 2.17 mg/dL — ABNORMAL HIGH (ref 0.61–1.24)
GFR calc Af Amer: 34 mL/min — ABNORMAL LOW (ref >60)
GFR calc non Af Amer: 29 mL/min — ABNORMAL LOW (ref >60)
Glucose, Bld: 118 mg/dL — ABNORMAL HIGH (ref 70–99)
Potassium: 4.3 mmol/L (ref 3.5–5.1)
Sodium: 137 mmol/L (ref 135–145)

## 2020-03-07 LAB — URINALYSIS, ROUTINE W REFLEX MICROSCOPIC
Bilirubin Urine: NEGATIVE
Glucose, UA: NEGATIVE mg/dL
Ketones, ur: NEGATIVE mg/dL
Leukocytes,Ua: NEGATIVE
Nitrite: NEGATIVE
Protein, ur: NEGATIVE mg/dL
Specific Gravity, Urine: 1.025 (ref 1.005–1.030)
pH: 5.5 (ref 5.0–8.0)

## 2020-03-07 LAB — SARS CORONAVIRUS 2 (TAT 6-24 HRS): SARS Coronavirus 2: NEGATIVE

## 2020-03-07 LAB — BRAIN NATRIURETIC PEPTIDE: B Natriuretic Peptide: 106.1 pg/mL — ABNORMAL HIGH (ref 0.0–100.0)

## 2020-03-07 MED ORDER — ENOXAPARIN SODIUM 40 MG/0.4ML ~~LOC~~ SOLN: 40.0000 mg | SUBCUTANEOUS | Status: DC

## 2020-03-07 MED ORDER — METHYLPREDNISOLONE SODIUM SUCC 125 MG IJ SOLR
125.0000 mg | Freq: Once | INTRAMUSCULAR | Status: AC
  Administered 2020-03-07: 125 mg via INTRAVENOUS
  Filled 2020-03-07: qty 2

## 2020-03-07 MED ORDER — PRIMIDONE 50 MG PO TABS
50.0000 mg | ORAL_TABLET | Freq: Two times a day (BID) | ORAL | Status: DC
  Administered 2020-03-08 – 2020-03-10 (×6): 50 mg via ORAL
  Filled 2020-03-07 (×6): qty 1

## 2020-03-07 MED ORDER — ACETAMINOPHEN 325 MG PO TABS
650.0000 mg | ORAL_TABLET | Freq: Four times a day (QID) | ORAL | Status: DC | PRN
  Administered 2020-03-08: 650 mg via ORAL
  Filled 2020-03-07: qty 2

## 2020-03-07 MED ORDER — CLOPIDOGREL BISULFATE 75 MG PO TABS
75.0000 mg | ORAL_TABLET | Freq: Every day | ORAL | Status: DC
  Administered 2020-03-07 – 2020-03-10 (×4): 75 mg via ORAL
  Filled 2020-03-07 (×4): qty 1

## 2020-03-07 MED ORDER — SERTRALINE HCL 25 MG PO TABS
25.0000 mg | ORAL_TABLET | Freq: Every day | ORAL | Status: DC
  Administered 2020-03-08 – 2020-03-09 (×3): 25 mg via ORAL
  Filled 2020-03-07 (×3): qty 1

## 2020-03-07 MED ORDER — CALCITRIOL 0.25 MCG PO CAPS
0.2500 ug | ORAL_CAPSULE | Freq: Every day | ORAL | Status: DC
  Administered 2020-03-08 – 2020-03-10 (×3): 0.25 ug via ORAL
  Filled 2020-03-07 (×3): qty 1

## 2020-03-07 MED ORDER — ATORVASTATIN CALCIUM 40 MG PO TABS
80.0000 mg | ORAL_TABLET | Freq: Every day | ORAL | Status: DC
Start: 2020-03-07 — End: 2020-03-10
  Administered 2020-03-07 – 2020-03-09 (×4): 80 mg via ORAL
  Filled 2020-03-07 (×4): qty 2

## 2020-03-07 MED ORDER — CARVEDILOL 25 MG PO TABS
25.0000 mg | ORAL_TABLET | Freq: Two times a day (BID) | ORAL | Status: DC
  Administered 2020-03-07 – 2020-03-10 (×7): 25 mg via ORAL
  Filled 2020-03-07 (×4): qty 1
  Filled 2020-03-07: qty 4
  Filled 2020-03-07 (×2): qty 1

## 2020-03-07 MED ORDER — HYDROCODONE-ACETAMINOPHEN 5-325 MG PO TABS: 1.0000 | ORAL_TABLET | ORAL | Status: DC | PRN

## 2020-03-07 MED ORDER — AMLODIPINE BESYLATE 10 MG PO TABS
10.0000 mg | ORAL_TABLET | Freq: Every day | ORAL | Status: DC
  Administered 2020-03-07 – 2020-03-10 (×4): 10 mg via ORAL
  Filled 2020-03-07: qty 1
  Filled 2020-03-07: qty 2
  Filled 2020-03-07 (×2): qty 1

## 2020-03-07 MED ORDER — HYDRALAZINE HCL 10 MG PO TABS
10.0000 mg | ORAL_TABLET | Freq: Three times a day (TID) | ORAL | Status: DC
  Administered 2020-03-07 – 2020-03-10 (×10): 10 mg via ORAL
  Filled 2020-03-07 (×14): qty 1

## 2020-03-07 MED ORDER — ONDANSETRON HCL 4 MG PO TABS: 4.0000 mg | ORAL_TABLET | Freq: Four times a day (QID) | ORAL | Status: DC | PRN

## 2020-03-07 MED ORDER — PANTOPRAZOLE SODIUM 40 MG PO TBEC
40.0000 mg | DELAYED_RELEASE_TABLET | Freq: Every day | ORAL | Status: DC
  Administered 2020-03-07 – 2020-03-10 (×4): 40 mg via ORAL
  Filled 2020-03-07 (×4): qty 1

## 2020-03-07 MED ORDER — ONDANSETRON HCL 4 MG/2ML IJ SOLN: 4.0000 mg | Freq: Four times a day (QID) | INTRAMUSCULAR | Status: DC | PRN

## 2020-03-07 MED ORDER — ACETAMINOPHEN 650 MG RE SUPP: 650.0000 mg | Freq: Four times a day (QID) | RECTAL | Status: DC | PRN

## 2020-03-07 MED ORDER — PRIMIDONE 50 MG PO TABS
50.0000 mg | ORAL_TABLET | Freq: Every day | ORAL | Status: DC
  Filled 2020-03-07: qty 1

## 2020-03-07 MED ORDER — APIXABAN 5 MG PO TABS
5.0000 mg | ORAL_TABLET | Freq: Every day | ORAL | Status: DC
  Administered 2020-03-07: 5 mg via ORAL
  Filled 2020-03-07: qty 2

## 2020-03-07 MED ORDER — APIXABAN 5 MG PO TABS
5.0000 mg | ORAL_TABLET | Freq: Two times a day (BID) | ORAL | Status: DC
  Administered 2020-03-08 – 2020-03-10 (×6): 5 mg via ORAL
  Filled 2020-03-07 (×7): qty 1

## 2020-03-07 NOTE — ED Triage Notes (Signed)
Patient arrived via EMS c/o shortness of breath, fatigue and gout in left foot. Patient endorses exertional dyspnea, fatigue since covid vaccination on 02/27/20. Patient is AO x 4, increased RR, unable to bear weight on left foot.

## 2020-03-07 NOTE — Progress Notes (Signed)
Alexander Hansen, MD paged regarding the pt's arrival to room 1318.

## 2020-03-07 NOTE — ED Provider Notes (Signed)
[facility] Provider Note: Georgena Spurling, MD, FACEP  CSN: [id number] MRN: [id number] ARRIVAL: 03/07/20 at [place]: [place] [facility]   HISTORY OF PRESENT ILLNESS  03/07/20 3:22 AM Alexander Austin is a 74 y.o. male who received his second Covid vaccine on 02/27/2020.  He had some expected post vaccination reactions (sore arm, body aches) that have resolved but he has had persistent generalized weakness and dyspnea since.  His dyspnea is worse with exertion and is moderate to severe at times.  He is also having a gout flare involving most of his left foot.  He rates his pain is a 10 out of 10 and he is unable to bear weight on it.  He describes the pain as sharp and throbbing and it is associated with swelling and mild erythema.  He denies fever or chills.   Past Medical History:  Diagnosis Date  . A-fib ([place])   . Anxiety   . Benign prostate hyperplasia   . Cancer ([place])    Kidney  . Chronic kidney disease   . CVA (cerebral infarction)   . Depression   . GERD (gastroesophageal reflux disease)   . Gout   . Hematospermia 01/13/2012  . History of myocardial infarction 1993  . Hyperlipidemia   . Hypertension   . Sleep apnea   . Stroke ([place])   . Vitamin B 12 deficiency     Past Surgical History:  Procedure Laterality Date  . ANGIOPLASTY     X 8   . BACK SURGERY    . 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  . SAPHENOUS VEIN GRAFT RESECTION  02/05/15    Family History  Problem Relation Age of Onset  . Hypertension Mother   . Heart disease Mother   . Stroke Mother   . Hypertension Father     Social History   Tobacco Use  . Smoking status: Former Smoker    Quit date: 07/24/1982    Years since quitting: 37.6  . Smokeless tobacco: Never Used  Substance Use Topics  . Alcohol use: No    Alcohol/week: 0.0 standard drinks  . Drug use: No    Prior to Admission medications   Medication Sig Start Date End Date Taking? Authorizing  Provider  allopurinol (ZYLOPRIM) 100 MG tablet Take 100 mg by mouth daily.    [provider]  amiodarone (PACERONE) 400 MG tablet Take 200 mg by mouth 2 (two) times daily. . 02/11/15 02/18/19  [provider]  amitriptyline (ELAVIL) 25 MG tablet Take 25 mg by mouth at bedtime.    [provider]  amLODipine (NORVASC) 10 MG tablet Take 10 mg by mouth daily.    [provider]  aspirin 325 MG EC tablet Take 325 mg by mouth daily.    [provider]  atorvastatin (LIPITOR) 80 MG tablet Take 80 mg by mouth daily.    [provider]  colchicine 0.6 MG tablet Take 2 tablets once. Take 1 tablet 1 hour later, do not take more than 3 tablets in 3 days.    [provider]  furosemide (LASIX) 20 MG tablet Take 20 mg by mouth daily.    [provider]  lisinopril (PRINIVIL,ZESTRIL) 2.5 MG tablet Take 2.5 mg by mouth daily.    [provider]  loperamide (IMODIUM) 2 MG capsule Take 2 mg by mouth 3 (three) times daily.    [provider]  metoprolol tartrate (LOPRESSOR)  25 MG tablet Take 1.5 tablets(37.5 mg total) by mouth every 6 hours.    [provider]  primidone (MYSOLINE) 50 MG tablet Take 100 mg by mouth 2 (two) times daily.    [provider]  ranitidine (ZANTAC) 150 MG tablet Take 150 mg by mouth daily.    [provider]  saccharomyces boulardii (FLORASTOR) 250 MG capsule Take 250 mg by mouth 2 (two) times daily.    [provider]  senna-docusate (SENOKOT-S) 8.6-50 MG per tablet Take 2 tablets by mouth at bedtime as needed for mild constipation.    [provider]  sertraline (ZOLOFT) 100 MG tablet Take 150 mg by mouth daily.    [provider]  tamsulosin (FLOMAX) 0.4 MG CAPS capsule Take 0.8 mg by mouth daily after supper.    [provider]  vitamin B-12 (CYANOCOBALAMIN) 500 MCG tablet Take 500 mcg by mouth daily.    [provider]   warfarin (COUMADIN) 5 MG tablet Take 5 mg by mouth daily at 6 PM. Adjust dose to as directed to keep INR 2-3.    [provider]    Allergies Imdur [isosorbide dinitrate]   REVIEW OF SYSTEMS  Negative except as noted here or in the History of Present Illness.   PHYSICAL EXAMINATION  Initial Vital Signs Blood pressure 114/65, pulse 71, temperature 98.2 F (36.8 C), temperature source Oral, resp. rate (!) 26, height 6\' 2"  (1.88 m), weight 108.9 kg, SpO2 96 %.  Examination General: Well-developed, well-nourished male in no acute distress; appearance consistent with age of record HENT: normocephalic; atraumatic Eyes: pupils equal, round and reactive to light; extraocular muscles intact Neck: supple Heart: regular rate and rhythm  Lungs: clear to auscultation bilaterally; tachypnea Abdomen: soft; nondistended; nontender; bowel sounds present Extremities: No deformity; pulses normal; tenderness, swelling and decreased mobility of left foot:    Neurologic: Awake, alert and oriented; motor function intact in all extremities and symmetric; no facial droop Skin: Warm and dry Psychiatric: Normal mood and affect   RESULTS  Summary of this visit's results, reviewed and interpreted by myself:   EKG Interpretation  Date/Time:  Friday March 07 2020 03:59:24 EDT Ventricular Rate:  72 PR Interval:    QRS Duration: 149 QT Interval:  464 QTC Calculation: 508 R Axis:   -36 Text Interpretation: ATRIAL PACED RHYTHM No previous ECGs available Confirmed by Shanon Rosser [phone]) on 03/07/2020 4:24:57 AM      Laboratory Studies: Results for orders placed or performed during the hospital encounter of 03/07/20 (from the past 24 hour(s))  CBC with Differential/Platelet     Status: Abnormal   Collection Time: 03/07/20  4:03 AM  Result Value Ref Range   WBC 5.4 4.0 - 10.5 K/uL   RBC 5.41 4.22 - 5.81 MIL/uL   Hemoglobin 15.7 13.0 - 17.0 g/dL   HCT 47.3 39.0 - 52.0 %   MCV 87.4 80.0  - 100.0 fL   MCH 29.0 26.0 - 34.0 pg   MCHC 33.2 30.0 - 36.0 g/dL   RDW 15.8 (H) 11.5 - 15.5 %   Platelets 150 150 - 400 K/uL   nRBC 0.0 0.0 - 0.2 %   Neutrophils Relative % 82 %   Neutro Abs 4.4 1.7 - 7.7 K/uL   Lymphocytes Relative 11 %   Lymphs Abs 0.6 (L) 0.7 - 4.0 K/uL   Monocytes Relative 6 %   Monocytes Absolute 0.3 0.1 - 1.0 K/uL   Eosinophils Relative 1 %  Eosinophils Absolute 0.1 0.0 - 0.5 K/uL   Basophils Relative 0 %   Basophils Absolute 0.0 0.0 - 0.1 K/uL   Immature Granulocytes 0 %   Abs Immature Granulocytes 0.02 0.00 - 0.07 K/uL  Basic metabolic panel     Status: Abnormal   Collection Time: 03/07/20  4:03 AM  Result Value Ref Range   Sodium 137 135 - 145 mmol/L   Potassium 4.3 3.5 - 5.1 mmol/L   Chloride 104 98 - 111 mmol/L   CO2 23 22 - 32 mmol/L   Glucose, Bld 118 (H) 70 - 99 mg/dL   BUN 34 (H) 8 - 23 mg/dL   Creatinine, Ser 2.17 (H) 0.61 - 1.24 mg/dL   Calcium 8.9 8.9 - 10.3 mg/dL   GFR calc non Af Amer 29 (L) >60 mL/min   GFR calc Af Amer 34 (L) >60 mL/min   Anion gap 10 5 - 15  Brain natriuretic peptide     Status: Abnormal   Collection Time: 03/07/20  4:03 AM  Result Value Ref Range   B Natriuretic Peptide 106.1 (H) 0.0 - 100.0 pg/mL   Imaging Studies: DG Chest Port 1 View  Result Date: 03/07/2020 CLINICAL DATA:  Shortness of breath, fatigue, gout in the left foot EXAM: PORTABLE CHEST 1 VIEW COMPARISON:  Radiograph May 10, 2019, January 26, 2019 FINDINGS: Stable appearance of the scattered calcified granulomata including a larger granuloma in the left lower lung measuring up to 1.4 cm in size. Some streaky opacities in the lung bases favor atelectasis given low volumes. No consolidation, features of edema, pneumothorax, or effusion. Cardiomegaly is similar to prior. Postsurgical changes related to prior CABG including intact and aligned sternotomy wires and multiple surgical clips projecting over the mediastinum. Pacer/defibrillator pack overlies the  left chest wall with leads at the right atrium, apex and coronary sinus. No acute osseous or soft tissue abnormality. IMPRESSION: 1. Low lung volumes with streaky opacities in the lung bases favoring atelectasis. 2. Stable cardiomegaly. 3. Stable appearance of calcified granulomata. Electronically Signed   By: Lovena Le M.D.   On: 03/07/2020 03:48    ED COURSE and MDM  Nursing notes, initial and subsequent vitals signs, including pulse oximetry, reviewed and interpreted by myself.  Vitals:   03/07/20 0310 03/07/20 0318 03/07/20 0319  BP:  114/65   Pulse:  71   Resp:  (!) 26   Temp:  98.2 F (36.8 C)   TempSrc:  Oral   SpO2: 92% 96%   Weight:   108.9 kg  Height:   6\' 2"  (1.88 m)   Medications  methylPREDNISolone sodium succinate (SOLU-MEDROL) 125 mg/2 mL injection 125 mg (125 mg Intravenous Given 03/07/20 0414)   5:06 AM The cause of the patient's dyspnea and weakness is not immediately evident.  He is not hypoxic even on room air.  He is not in overt pulmonary edema.  His BNP is within normal limits.  He is also unable to perform activities of daily living due to his acute gout flare.  For example, he arrived soiled because he was unable to get to the bathroom.  We will have him admitted for further evaluation.  His renal function has worsened from 01/30/2020 when his BUN and creatinine were 22/1.74.  Dr. Myna Hidalgo accepts for admission to hospitalist service.   PROCEDURES  Procedures   ED DIAGNOSES     ICD-10-CM   1. Generalized weakness  R53.1   2. SOB (shortness of breath)  R06.02 DG  Chest Port 1 View    DG Chest Port 1 View  3. Acute on chronic renal insufficiency  N28.9    N18.9   4. Acute idiopathic gout of left foot  M10.072   5. Inability to perform activities of daily living  Z78.9        Shanon Rosser, MD 03/07/20 [phone]

## 2020-03-07 NOTE — H&P (Addendum)
History and Physical    Alexander Austin  LOV:[id number]  DOB: 23-Jun-1946  DOA: 03/07/2020 PCP: Patient, No Pcp Per   Patient coming from: home alone  Chief Complaint: pain in left foot- direct admit from [facility]  HPI: Alexander Austin is a 74 y.o. male with medical history of A-fib, renal cell cancer, 9/12 s/p right partial nephrectomy, CKD 4 with Cr between 1.5-1.75, MI, ischemic cardiomyopathy with an AICD (4 stents, 8 angioplasties, CABG re-do), MI, HTN, OSA, CVA, BPH, GERD, anxiety, low B12.  He states his left foot has hurt for 2 wks now. He was [facility] on a [facility] but it has not helped. He has not been able to ambulate for 3 days due to the ongoing pain and swelling and was found covered in his own excrement and bought to the ED. He further complains of exertional dyspnea and fatigue since obtaining his COVID vaccine on 02/27/20. No cough and no fevers. He has been taking all of his medications appropriately.  His has not eaten much solid food in 3 days but his landlord has been bringing him water so he has drank some water.   ED Course:  At [facility] center> RR in 20s, BP114/65,  BUN/ Cr 34/2.17  Review of Systems:  All other systems reviewed and apart from HPI, are negative.  Past Medical History:  Diagnosis Date  . A-fib ([place])   . Anxiety   . Benign prostate hyperplasia   . Cancer ([place])    Kidney  . Chronic kidney disease   . CVA (cerebral infarction)   . Depression   . GERD (gastroesophageal reflux disease)   . Gout   . Hematospermia 01/13/2012  . History of myocardial infarction 1993  . Hyperlipidemia   . Hypertension   . Sleep apnea   . Stroke ([place])   . Vitamin B 12 deficiency     Past Surgical History:  Procedure Laterality Date  . ANGIOPLASTY     X 8   . BACK SURGERY    . 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  . SAPHENOUS VEIN GRAFT RESECTION  02/05/15    Social History:   reports that he quit smoking about 37 years ago. He has never  used smokeless tobacco. He reports that he does not drink alcohol or use drugs.  Allergies  Allergen Reactions  . Imdur [Isosorbide Dinitrate] Other (See Comments)    Cardiovascular Arrest    Family History  Problem Relation Age of Onset  . Hypertension Mother   . Heart disease Mother   . Stroke Mother   . Hypertension Father      Prior to Admission medications   Medication Sig Start Date End Date Taking? Authorizing Provider  carvedilol (COREG) 25 MG tablet Take by mouth in the morning and at bedtime.  05/05/15  Yes [provider]  ferrous sulfate 325 (65 FE) MG tablet Take 325 mg by mouth every Monday, Wednesday, and Friday.  10/18/18  Yes [provider]  furosemide (LASIX) 40 MG tablet Take by mouth 2 (two) times daily.  04/11/17  Yes [provider]  Vitamin D, Ergocalciferol, (DRISDOL) 1.25 MG (50000 UNIT) CAPS capsule Take 50,000 Units by mouth every 7 (seven) days.   Yes [provider]  allopurinol (ZYLOPRIM) 100 MG tablet Take 250 mg by mouth daily.     [provider]  amLODipine (NORVASC) 10 MG tablet Take 10 mg by mouth daily.     [provider]  apixaban (ELIQUIS) 5 MG TABS tablet Take by mouth 2 (two) times daily.     [provider]  atorvastatin (LIPITOR) 80 MG tablet Take by mouth daily at 6 PM.     [provider]  calcitRIOL (ROCALTROL) 0.25 MCG capsule Take by mouth daily.     [provider]  clopidogrel (PLAVIX) 75 MG tablet Take by mouth daily.     [provider]  Docusate Sodium (DSS) 100 MG CAPS Take 100 mg by mouth daily.     [provider]  hydrALAZINE (APRESOLINE) 10 MG tablet Take 10 mg by mouth 3 (three) times daily.     [provider]  ketotifen (ZADITOR) 0.025 % ophthalmic solution Place 1 drop into both eyes 2 times daily.    [provider]  nitroGLYCERIN (NITROSTAT) 0.4 MG SL tablet Place 0.4 mg under the tongue every 5 (five)  minutes as needed.     [provider]  pantoprazole (PROTONIX) 40 MG tablet Take 40 mg by mouth daily. Take before morning meds to protect stomach    [provider]  potassium chloride SA (KLOR-CON) 20 MEQ tablet Take by mouth with breakfast, with lunch, and with evening meal.     [provider]  primidone (MYSOLINE) 50 MG tablet Take 100 mg by mouth in the morning and at bedtime.     [provider]  sertraline (ZOLOFT) 50 MG tablet Take 25 mg by mouth at bedtime.     [provider]    Physical Exam: Wt Readings from Last 3 Encounters:  03/07/20 108.9 kg  03/20/15 96.2 kg  03/13/15 97.5 kg   Vitals:   03/07/20 0902 03/07/20 1100 03/07/20 1216 03/07/20 1338  BP: (!) 168/80 (!) 151/97 (!) 148/88 (!) 147/72  Pulse: 71 80 82 73  Resp: 19 (!) 22 (!) 24 17  Temp:  97.7 F (36.5 C)  (!) 97.5 F (36.4 C)  TempSrc:  Oral  Oral  SpO2: 94% 96% 98% 95%  Weight:      Height:          Constitutional:  Calm & comfortable Eyes: PERRLA, lids and conjunctivae normal ENT:  Mucous membranes are moist.  Pharynx clear of exudate   Normal dentition.  Neck: Supple, no masses  Respiratory:  Clear to auscultation bilaterally  Normal respiratory effort.  Cardiovascular:  S1 & S2 heard, regular rate and rhythm No Murmurs Abdomen:  Non distended, morbidly obese No tenderness, No masses Bowel sounds normal Extremities:  No clubbing / cyanosis Left foot is erythematous swollen and tender specifically in the dorsum of the foot and the ankle   Skin:  No rashes, lesions or ulcers Neurologic:  AAO x 3 CN 2-12 grossly intact Sensation intact Strength 5/5 in all 4 extremities Psychiatric:  Normal Mood and affect    Labs on Admission: I have personally reviewed following labs and imaging studies  CBC: Recent Labs  Lab 03/07/20 0403  WBC 5.4  NEUTROABS 4.4  HGB 15.7  HCT 47.3  MCV 87.4  PLT 696   Basic Metabolic Panel: Recent  Labs  Lab 03/07/20 0403  NA 137  K 4.3  CL 104  CO2 23  GLUCOSE 118*  BUN 34*  CREATININE 2.17*  CALCIUM 8.9   GFR: Estimated Creatinine Clearance: 39.2 mL/min (A) (by C-G formula based on SCr of 2.17 mg/dL (H)). Liver Function Tests: No results for input(s): AST, ALT, ALKPHOS, BILITOT, PROT, ALBUMIN in the last  168 hours. No results for input(s): LIPASE, AMYLASE in the last 168 hours. No results for input(s): AMMONIA in the last 168 hours. Coagulation Profile: No results for input(s): INR, PROTIME in the last 168 hours. Cardiac Enzymes: No results for input(s): CKTOTAL, CKMB, CKMBINDEX, TROPONINI in the last 168 hours. BNP (last 3 results) No results for input(s): PROBNP in the last 8760 hours. HbA1C: No results for input(s): HGBA1C in the last 72 hours. CBG: No results for input(s): GLUCAP in the last 168 hours. Lipid Profile: No results for input(s): CHOL, HDL, LDLCALC, TRIG, CHOLHDL, LDLDIRECT in the last 72 hours. Thyroid Function Tests: No results for input(s): TSH, T4TOTAL, FREET4, T3FREE, THYROIDAB in the last 72 hours. Anemia Panel: No results for input(s): VITAMINB12, FOLATE, FERRITIN, TIBC, IRON, RETICCTPCT in the last 72 hours. Urine analysis:    Component Value Date/Time   COLORURINE YELLOW 03/07/2020 0845   APPEARANCEUR CLEAR 03/07/2020 0845   LABSPEC 1.025 03/07/2020 0845   PHURINE 5.5 03/07/2020 0845   GLUCOSEU NEGATIVE 03/07/2020 0845   HGBUR SMALL (A) 03/07/2020 0845   BILIRUBINUR NEGATIVE 03/07/2020 0845   KETONESUR NEGATIVE 03/07/2020 0845   PROTEINUR NEGATIVE 03/07/2020 0845   NITRITE NEGATIVE 03/07/2020 0845   LEUKOCYTESUR NEGATIVE 03/07/2020 0845   Sepsis Labs: @LABRCNTIP (procalcitonin:4,lacticidven:4) )No results found for this or any previous visit (from the past 240 hour(s)).   Radiological Exams on Admission: DG Chest Port 1 View  Result Date: 03/07/2020 CLINICAL DATA:  Shortness of breath, fatigue, gout in the left foot EXAM:  PORTABLE CHEST 1 VIEW COMPARISON:  Radiograph May 10, 2019, January 26, 2019 FINDINGS: Stable appearance of the scattered calcified granulomata including a larger granuloma in the left lower lung measuring up to 1.4 cm in size. Some streaky opacities in the lung bases favor atelectasis given low volumes. No consolidation, features of edema, pneumothorax, or effusion. Cardiomegaly is similar to prior. Postsurgical changes related to prior CABG including intact and aligned sternotomy wires and multiple surgical clips projecting over the mediastinum. Pacer/defibrillator pack overlies the left chest wall with leads at the right atrium, apex and coronary sinus. No acute osseous or soft tissue abnormality. IMPRESSION: 1. Low lung volumes with streaky opacities in the lung bases favoring atelectasis. 2. Stable cardiomegaly. 3. Stable appearance of calcified granulomata. Electronically Signed   By: Lovena Le M.D.   On: 03/07/2020 03:48    EKG: Independently reviewed. 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   DVT prophylaxis: Eliquis  Code Status: Do not intubate  Family Communication:    Disposition Plan: from home- will need a PT eval before sending home  Consults called: none  Admission status: observation    Debbe Odea MD [facility] Pager: [email or web address] Password [email or web address] 7PM-7AM, please contact night-coverage   03/07/2020, 2:53 PM

## 2020-03-08 ENCOUNTER — Observation Stay (HOSPITAL_COMMUNITY): Payer: No Typology Code available for payment source

## 2020-03-08 DIAGNOSIS — I25810 Atherosclerosis of coronary artery bypass graft(s) without angina pectoris: Secondary | ICD-10-CM | POA: Diagnosis present

## 2020-03-08 DIAGNOSIS — N184 Chronic kidney disease, stage 4 (severe): Secondary | ICD-10-CM | POA: Diagnosis present

## 2020-03-08 DIAGNOSIS — R251 Tremor, unspecified: Secondary | ICD-10-CM | POA: Diagnosis present

## 2020-03-08 DIAGNOSIS — E669 Obesity, unspecified: Secondary | ICD-10-CM | POA: Diagnosis present

## 2020-03-08 DIAGNOSIS — E869 Volume depletion, unspecified: Secondary | ICD-10-CM | POA: Diagnosis present

## 2020-03-08 DIAGNOSIS — M7732 Calcaneal spur, left foot: Secondary | ICD-10-CM | POA: Diagnosis present

## 2020-03-08 DIAGNOSIS — E538 Deficiency of other specified B group vitamins: Secondary | ICD-10-CM | POA: Diagnosis present

## 2020-03-08 DIAGNOSIS — I255 Ischemic cardiomyopathy: Secondary | ICD-10-CM | POA: Diagnosis present

## 2020-03-08 DIAGNOSIS — K219 Gastro-esophageal reflux disease without esophagitis: Secondary | ICD-10-CM | POA: Diagnosis present

## 2020-03-08 DIAGNOSIS — N179 Acute kidney failure, unspecified: Secondary | ICD-10-CM | POA: Diagnosis present

## 2020-03-08 DIAGNOSIS — I2510 Atherosclerotic heart disease of native coronary artery without angina pectoris: Secondary | ICD-10-CM | POA: Diagnosis present

## 2020-03-08 DIAGNOSIS — I480 Paroxysmal atrial fibrillation: Secondary | ICD-10-CM | POA: Diagnosis present

## 2020-03-08 DIAGNOSIS — M10272 Drug-induced gout, left ankle and foot: Secondary | ICD-10-CM | POA: Diagnosis not present

## 2020-03-08 DIAGNOSIS — R0602 Shortness of breath: Secondary | ICD-10-CM | POA: Diagnosis present

## 2020-03-08 DIAGNOSIS — E785 Hyperlipidemia, unspecified: Secondary | ICD-10-CM | POA: Diagnosis present

## 2020-03-08 DIAGNOSIS — F419 Anxiety disorder, unspecified: Secondary | ICD-10-CM | POA: Diagnosis present

## 2020-03-08 DIAGNOSIS — Z8673 Personal history of transient ischemic attack (TIA), and cerebral infarction without residual deficits: Secondary | ICD-10-CM | POA: Diagnosis not present

## 2020-03-08 DIAGNOSIS — N400 Enlarged prostate without lower urinary tract symptoms: Secondary | ICD-10-CM | POA: Diagnosis present

## 2020-03-08 DIAGNOSIS — I129 Hypertensive chronic kidney disease with stage 1 through stage 4 chronic kidney disease, or unspecified chronic kidney disease: Secondary | ICD-10-CM | POA: Diagnosis present

## 2020-03-08 DIAGNOSIS — Z20822 Contact with and (suspected) exposure to covid-19: Secondary | ICD-10-CM | POA: Diagnosis present

## 2020-03-08 DIAGNOSIS — Z6830 Body mass index (BMI) 30.0-30.9, adult: Secondary | ICD-10-CM | POA: Diagnosis not present

## 2020-03-08 DIAGNOSIS — F3340 Major depressive disorder, recurrent, in remission, unspecified: Secondary | ICD-10-CM | POA: Diagnosis present

## 2020-03-08 DIAGNOSIS — M109 Gout, unspecified: Secondary | ICD-10-CM | POA: Diagnosis present

## 2020-03-08 DIAGNOSIS — Z85528 Personal history of other malignant neoplasm of kidney: Secondary | ICD-10-CM | POA: Diagnosis not present

## 2020-03-08 DIAGNOSIS — G4733 Obstructive sleep apnea (adult) (pediatric): Secondary | ICD-10-CM | POA: Diagnosis present

## 2020-03-08 DIAGNOSIS — I252 Old myocardial infarction: Secondary | ICD-10-CM | POA: Diagnosis not present

## 2020-03-08 LAB — CBC
HCT: 36.3 % — ABNORMAL LOW (ref 39.0–52.0)
Hemoglobin: 11.9 g/dL — ABNORMAL LOW (ref 13.0–17.0)
MCH: 29 pg (ref 26.0–34.0)
MCHC: 32.8 g/dL (ref 30.0–36.0)
MCV: 88.3 fL (ref 80.0–100.0)
Platelets: 185 10*3/uL (ref 150–400)
RBC: 4.11 MIL/uL — ABNORMAL LOW (ref 4.22–5.81)
RDW: 15.3 % (ref 11.5–15.5)
WBC: 7.2 10*3/uL (ref 4.0–10.5)
nRBC: 0 % (ref 0.0–0.2)

## 2020-03-08 LAB — BASIC METABOLIC PANEL
Anion gap: 12 (ref 5–15)
BUN: 49 mg/dL — ABNORMAL HIGH (ref 8–23)
CO2: 21 mmol/L — ABNORMAL LOW (ref 22–32)
Calcium: 9 mg/dL (ref 8.9–10.3)
Chloride: 107 mmol/L (ref 98–111)
Creatinine, Ser: 2.18 mg/dL — ABNORMAL HIGH (ref 0.61–1.24)
GFR calc Af Amer: 33 mL/min — ABNORMAL LOW (ref >60)
GFR calc non Af Amer: 29 mL/min — ABNORMAL LOW (ref >60)
Glucose, Bld: 141 mg/dL — ABNORMAL HIGH (ref 70–99)
Potassium: 3.9 mmol/L (ref 3.5–5.1)
Sodium: 140 mmol/L (ref 135–145)

## 2020-03-08 LAB — URIC ACID: Uric Acid, Serum: 9.9 mg/dL — ABNORMAL HIGH (ref 3.7–8.6)

## 2020-03-08 MED ORDER — METHYLPREDNISOLONE SODIUM SUCC 40 MG IJ SOLR
40.0000 mg | INTRAMUSCULAR | Status: DC
  Administered 2020-03-08 – 2020-03-10 (×3): 40 mg via INTRAVENOUS
  Filled 2020-03-08 (×3): qty 1

## 2020-03-08 NOTE — Progress Notes (Signed)
PROGRESS NOTE  [name] XNT:[id number] DOB: [birth year] DOA: 03/07/2020 PCP: Patient, No Pcp Per  HPI/Recap of past 24 hours:   Alexander Austin is a 74 y.o. male with medical history of paroxysmal A-fib on Eliquis, renal cell cancer, 9/12 s/p right partial nephrectomy, CKD 4 with Cr between 1.5-1.75, MI, ischemic cardiomyopathy with an AICD (4 stents, 8 angioplasties, CABG re-do), MI, HTN, OSA, CVA, BPH, GERD, anxiety, low B12.  Presented with complaints of left foot pain of 2 weeks duration.  States he knows it is gout.- had plan for injection of left foot at the [facility].  He is on allopurinol and reports compliance with his medications.  Has significant pain in his left foot making it difficult to bear weight.  TRH was asked to admit.  Was started on IV Solu-Medrol.  On exam left dorsal portion of his left foot is edematous and very tender to palpation.  No erythema or warmth noted to suggest cellulitis.  Will obtain a uric acid level and continue to treat with IV steroids.  03/08/20: Seen and examined.  Reports significant pain in his left foot.  Pain is 9 out of 10.  Pain management in place.  Will obtain an x-ray and uric acid level.   Assessment/Plan: Principal Problem:   Acute gout Active Problems:   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 (HCC)   PAF (paroxysmal atrial fibrillation) (HCC)   History of renal cell cancer   Vitamin B12 deficiency   Major depressive disorder, recurrent, in remission ([place])   Essential hypertension, benign   Tremor   BPH (benign prostatic hyperplasia)   CKD (chronic kidney disease) stage 4, GFR 15-29 ml/min (HCC)   Obesity, Class III, BMI 40-49.9 (morbid obesity) ([place])  Severe left foot pain with suspected gout Patient had plans for steroid injection to his left foot at the [facility] Will obtain a uric acid level and x-ray of his left foot to rule out other etiologies like  cellulitis prior to possible joint aspiration by ortho. Continue IV Solu-Medrol Continue pain management We will consult orthopedic surgery for possible joint aspiration to look for crystals or other possible etiologies  Possible AKI on CKD 3B Per medical record last creatinine was 1.7 (2016) Presented with creatinine 2.9 with GFR of 29. Avoid nephrotoxins, dehydration or hypotension Monitor urine output and repeat BMP in the morning  History of renal cancer Follows with oncology  Paroxysmal A. fib, rate controlled Continue Coreg for rate control Continue Eliquis for secondary CVA prevention  Coronary artery disease status post CABG Continue home cardiac medication  History of gout Reports compliance with his allopurinol  Essential hypertension Blood pressure is at goal Continue home medications  Chronic depression/anxiety Continue Zoloft  Hyperlipidemia Continue Lipitor  History of CVA Continue Plavix and statin   DVT prophylaxis: Eliquis  Code Status: Do not intubate  Family Communication:   Will call family if okay with the patient.   Disposition Plan:  Patient is from home.  Anticipate discharge to home in the next 24 to 48 hours once orthopedic surgery signs off and symptoms have improved.  Barrier to discharge: Persistent symptomatology and possible procedure.  Consults called:  Orthopedic surgery    Objective: Vitals:   03/07/20 1735 03/07/20 2207 03/08/20 0434 03/08/20 1007  BP: (!) 150/53 (!) 153/67 (!) 131/51 (!) 148/70  Pulse: 73 71 (!) 55 61  Resp:  18 18 16  Temp:  98.1 F (36.7 C) 97.7 F (36.5 C) 97.8 F (36.6 C)  TempSrc:  Oral Oral Oral  SpO2:  93% 95% 95%  Weight:      Height:        Intake/Output Summary (Last 24 hours) at 03/08/2020 1210 Last data filed at 03/08/2020 1000 Gross per 24 hour  Intake 720 ml  Output 250 ml  Net 470 ml   Filed Weights   03/07/20 0319  Weight: 108.9 kg    Exam:  . General: 74 y.o. year-old  male well developed well nourished in no acute distress.  Alert and oriented x3. . Cardiovascular: Regular rate and rhythm with no rubs or gallops.  No thyromegaly or JVD noted.   [name] Respiratory: Clear to auscultation with no wheezes or rales. Good inspiratory effort. . Abdomen: Soft nontender nondistended with normal bowel sounds x4 quadrants. . Musculoskeletal: Left metatarsal dorsal portion of foot is edematous and tender with minimal touch. . Psychiatry: Mood is appropriate for condition and setting   Data Reviewed: CBC: Recent Labs  Lab 03/07/20 0403 03/08/20 0359  WBC 5.4 7.2  NEUTROABS 4.4  --   HGB 15.7 11.9*  HCT 47.3 36.3*  MCV 87.4 88.3  PLT 150 762   Basic Metabolic Panel: Recent Labs  Lab 03/07/20 0403 03/08/20 0359  NA 137 140  K 4.3 3.9  CL 104 107  CO2 23 21*  GLUCOSE 118* 141*  BUN 34* 49*  CREATININE 2.17* 2.18*  CALCIUM 8.9 9.0   GFR: Estimated Creatinine Clearance: 39.1 mL/min (A) (by C-G formula based on SCr of 2.18 mg/dL (H)). Liver Function Tests: No results for input(s): AST, ALT, ALKPHOS, BILITOT, PROT, ALBUMIN in the last 168 hours. No results for input(s): LIPASE, AMYLASE in the last 168 hours. No results for input(s): AMMONIA in the last 168 hours. Coagulation Profile: No results for input(s): INR, PROTIME in the last 168 hours. Cardiac Enzymes: No results for input(s): CKTOTAL, CKMB, CKMBINDEX, TROPONINI in the last 168 hours. BNP (last 3 results) No results for input(s): PROBNP in the last 8760 hours. HbA1C: No results for input(s): HGBA1C in the last 72 hours. CBG: No results for input(s): GLUCAP in the last 168 hours. Lipid Profile: No results for input(s): CHOL, HDL, LDLCALC, TRIG, CHOLHDL, LDLDIRECT in the last 72 hours. Thyroid Function Tests: No results for input(s): TSH, T4TOTAL, FREET4, T3FREE, THYROIDAB in the last 72 hours. Anemia Panel: No results for input(s): VITAMINB12, FOLATE, FERRITIN, TIBC, IRON, RETICCTPCT in the  last 72 hours. Urine analysis:    Component Value Date/Time   COLORURINE YELLOW 03/07/2020 0845   APPEARANCEUR CLEAR 03/07/2020 0845   LABSPEC 1.025 03/07/2020 0845   PHURINE 5.5 03/07/2020 0845   GLUCOSEU NEGATIVE 03/07/2020 0845   HGBUR SMALL (A) 03/07/2020 0845   BILIRUBINUR NEGATIVE 03/07/2020 0845   KETONESUR NEGATIVE 03/07/2020 0845   PROTEINUR NEGATIVE 03/07/2020 0845   NITRITE NEGATIVE 03/07/2020 0845   LEUKOCYTESUR NEGATIVE 03/07/2020 0845   Sepsis Labs: @LABRCNTIP (procalcitonin:4,lacticidven:4)  ) Recent Results (from the past 240 hour(s))  SARS CORONAVIRUS 2 (TAT 6-24 HRS) Nasopharyngeal Nasopharyngeal Swab     Status: None   Collection Time: 03/07/20  4:03 AM   Specimen: Nasopharyngeal Swab  Result Value Ref Range Status   SARS Coronavirus 2 NEGATIVE NEGATIVE Final    Comment: (NOTE) SARS-CoV-2 target nucleic acids are NOT DETECTED. The SARS-CoV-2 RNA is generally detectable in upper and lower respiratory specimens during the acute phase of infection. Negative results do not preclude  SARS-CoV-2 infection, do not rule out co-infections with other pathogens, and should not be used as the sole basis for treatment or other patient management decisions. Negative results must be combined with clinical observations, patient history, and epidemiological information. The expected result is Negative. Fact Sheet for Patients: SugarRoll.be Fact Sheet for Healthcare Providers: https://www.woods-mathews.com/ This test is not yet approved or cleared by the [place] FDA and  has been authorized for detection and/or diagnosis of SARS-CoV-2 by FDA under an Emergency Use Authorization (EUA). This EUA will remain  in effect (meaning this test can be used) for the duration of the COVID-19 declaration under Section 56 4(b)(1) of the Act, 21 U.S.C. section 360bbb-3(b)(1), unless the authorization is terminated or revoked  sooner. Performed at [facility], [place] [address] [place], [place] [address]       Studies: No results found.  Scheduled Meds: . amLODipine  10 mg Oral Daily  . apixaban  5 mg Oral BID  . atorvastatin  80 mg Oral q1800  . calcitRIOL  0.25 mcg Oral Daily  . carvedilol  25 mg Oral BID WC  . clopidogrel  75 mg Oral Daily  . hydrALAZINE  10 mg Oral TID  . methylPREDNISolone (SOLU-MEDROL) injection  40 mg Intravenous Q24H  . pantoprazole  40 mg Oral Daily  . primidone  50 mg Oral BID  . sertraline  25 mg Oral QHS    Continuous Infusions:   LOS: 0 days     Kayleen Memos, MD [facility] Pager [phone]  If 7PM-7AM, please contact night-coverage [email or web address] Password [email or web address] 03/08/2020, 12:10 PM

## 2020-03-09 LAB — BASIC METABOLIC PANEL
Anion gap: 10 (ref 5–15)
BUN: 54 mg/dL — ABNORMAL HIGH (ref 8–23)
CO2: 24 mmol/L (ref 22–32)
Calcium: 8.7 mg/dL — ABNORMAL LOW (ref 8.9–10.3)
Chloride: 110 mmol/L (ref 98–111)
Creatinine, Ser: 2.12 mg/dL — ABNORMAL HIGH (ref 0.61–1.24)
GFR calc Af Amer: 34 mL/min — ABNORMAL LOW (ref >60)
GFR calc non Af Amer: 30 mL/min — ABNORMAL LOW (ref >60)
Glucose, Bld: 104 mg/dL — ABNORMAL HIGH (ref 70–99)
Potassium: 4.2 mmol/L (ref 3.5–5.1)
Sodium: 144 mmol/L (ref 135–145)

## 2020-03-09 MED ORDER — KETOTIFEN FUMARATE 0.025 % OP SOLN
1.0000 [drp] | Freq: Two times a day (BID) | OPHTHALMIC | Status: DC
  Filled 2020-03-09: qty 5

## 2020-03-09 MED ORDER — LACTATED RINGERS IV SOLN: INTRAVENOUS | Status: DC

## 2020-03-09 MED ORDER — ALLOPURINOL 300 MG PO TABS
300.0000 mg | ORAL_TABLET | Freq: Every day | ORAL | Status: DC
  Administered 2020-03-09 – 2020-03-10 (×2): 300 mg via ORAL
  Filled 2020-03-09 (×2): qty 1

## 2020-03-09 MED ORDER — FERROUS SULFATE 325 (65 FE) MG PO TABS
325.0000 mg | ORAL_TABLET | ORAL | Status: DC
  Administered 2020-03-10: 325 mg via ORAL
  Filled 2020-03-09: qty 1

## 2020-03-09 NOTE — Plan of Care (Signed)

## 2020-03-09 NOTE — Progress Notes (Signed)
PROGRESS NOTE  Alexander Austin HLK:[id number] DOB: 05/21/1946 DOA: 03/07/2020 PCP: Patient, No Pcp Per  HPI/Recap of past 24 hours:   Alexander Austin is a 74 y.o. male with medical history of paroxysmal A-fib on Eliquis, renal cell cancer, 9/12 s/p right partial nephrectomy, CKD 4 with Cr between 1.5-1.75, MI, ischemic cardiomyopathy with an AICD (4 stents, 8 angioplasties, CABG re-do), MI, HTN, OSA, CVA, BPH, GERD, anxiety, low B12.  Presented with complaints of left foot pain of 2 weeks duration.  States he knows it is gout.- had plan for injection of left foot at the [facility].  He is on allopurinol and reports compliance with his medications.  Has significant pain in his left foot making it difficult to bear weight.  TRH was asked to admit.  Was started on IV Solu-Medrol.  On exam left dorsal portion of his left foot is edematous and very tender to palpation.  No erythema or warmth noted to suggest cellulitis.  Xray Left foot no evidence of cellulitis.  Uric acid 9.9.  3/21: Left foot pain improved after starting IV solumedrol.  PT OT to assess to see if can safely bare weight.     Assessment/Plan: Principal Problem:   Acute gout Active Problems:   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 (HCC)   PAF (paroxysmal atrial fibrillation) (HCC)   History of renal cell cancer   Vitamin B12 deficiency   Major depressive disorder, recurrent, in remission ([place])   Essential hypertension, benign   Tremor   BPH (benign prostatic hyperplasia)   CKD (chronic kidney disease) stage 4, GFR 15-29 ml/min (HCC)   Obesity, Class III, BMI 40-49.9 (morbid obesity) ([place])  Severe left foot pain Likely acute gout flare Patient missed appt; had plans for steroid injection to his left foot at the [facility] No evidence of cellulitis on left foot x-ray. Uric acid 9.9 Responded well to IV fluid Medrol. Will work with PT OT today and plan for DC  tomorrow  AKI on CKD 3B Per medical record last creatinine was 1.7 (2016) Presented with creatinine 2.17 with GFR of 29. Mild improvement.  Not taking enough oral intake.   Will hydrate with IV fluid, LR 75 cc/h. Continue to avoid nephrotoxins, dehydration and hypotension. Repeat BMP in the morning and continue to monitor urine output  History of renal cancer Follows with oncology  Paroxysmal A. fib, rate controlled Continue Coreg for rate control Continue Eliquis for secondary CVA prevention  Coronary artery disease status post CABG Continue home cardiac medication  History of gout Reports compliance with his allopurinol  Essential hypertension Blood pressure is at goal Continue home medications  Chronic depression/anxiety Continue Zoloft  Hyperlipidemia Continue Lipitor  History of CVA Continue Plavix and statin   DVT prophylaxis: Eliquis  Code Status: Do not intubate  Family Communication:   Will call family if okay with the patient.   Disposition Plan:  Patient is from home.  Anticipate discharge to home likely tomorrow 03/10/2020 once symptoms have improved.  Barrier to discharge: Persistent symptomatology.  Consults called:  curbsided with orthopedic surgery    Objective: Vitals:   03/08/20 2055 03/09/20 0639 03/09/20 1110 03/09/20 1112  BP: (!) 147/72 (!) 151/78 126/70 126/70  Pulse: 60 62    Resp: 18 18    Temp: 98 F (36.7 C) 97.6 F (36.4 C)    TempSrc: Oral Oral    SpO2: 98% 96%  Weight:      Height:        Intake/Output Summary (Last 24 hours) at 03/09/2020 1326 Last data filed at 03/09/2020 0930 Gross per 24 hour  Intake 630 ml  Output 1350 ml  Net -720 ml   Filed Weights   03/07/20 0319  Weight: 108.9 kg    Exam:  . General: 74 y.o. year-old male obese in no acute distress.  Alert and oriented x3.   . Cardiovascular: Regular rate and rhythm no rubs or gallops.   [name] Respiratory: Clear auscultation no wheezes no rales.   .   Abdomen: Soft nontender normal bowel sounds present.   . Musculoskeletal: Left metatarsal dorsal portion of the foot is edematous and tender with minimal touch. . Psychiatry: Mood is appropriate for condition and setting.  Data Reviewed: CBC: Recent Labs  Lab 03/07/20 0403 03/08/20 0359  WBC 5.4 7.2  NEUTROABS 4.4  --   HGB 15.7 11.9*  HCT 47.3 36.3*  MCV 87.4 88.3  PLT 150 409   Basic Metabolic Panel: Recent Labs  Lab 03/07/20 0403 03/08/20 0359 03/09/20 0657  NA 137 140 144  K 4.3 3.9 4.2  CL 104 107 110  CO2 23 21* 24  GLUCOSE 118* 141* 104*  BUN 34* 49* 54*  CREATININE 2.17* 2.18* 2.12*  CALCIUM 8.9 9.0 8.7*   GFR: Estimated Creatinine Clearance: 40.2 mL/min (A) (by C-G formula based on SCr of 2.12 mg/dL (H)). Liver Function Tests: No results for input(s): AST, ALT, ALKPHOS, BILITOT, PROT, ALBUMIN in the last 168 hours. No results for input(s): LIPASE, AMYLASE in the last 168 hours. No results for input(s): AMMONIA in the last 168 hours. Coagulation Profile: No results for input(s): INR, PROTIME in the last 168 hours. Cardiac Enzymes: No results for input(s): CKTOTAL, CKMB, CKMBINDEX, TROPONINI in the last 168 hours. BNP (last 3 results) No results for input(s): PROBNP in the last 8760 hours. HbA1C: No results for input(s): HGBA1C in the last 72 hours. CBG: No results for input(s): GLUCAP in the last 168 hours. Lipid Profile: No results for input(s): CHOL, HDL, LDLCALC, TRIG, CHOLHDL, LDLDIRECT in the last 72 hours. Thyroid Function Tests: No results for input(s): TSH, T4TOTAL, FREET4, T3FREE, THYROIDAB in the last 72 hours. Anemia Panel: No results for input(s): VITAMINB12, FOLATE, FERRITIN, TIBC, IRON, RETICCTPCT in the last 72 hours. Urine analysis:    Component Value Date/Time   COLORURINE YELLOW 03/07/2020 0845   APPEARANCEUR CLEAR 03/07/2020 0845   LABSPEC 1.025 03/07/2020 0845   PHURINE 5.5 03/07/2020 0845   GLUCOSEU NEGATIVE 03/07/2020 0845    HGBUR SMALL (A) 03/07/2020 0845   BILIRUBINUR NEGATIVE 03/07/2020 0845   KETONESUR NEGATIVE 03/07/2020 0845   PROTEINUR NEGATIVE 03/07/2020 0845   NITRITE NEGATIVE 03/07/2020 0845   LEUKOCYTESUR NEGATIVE 03/07/2020 0845   Sepsis Labs: @LABRCNTIP (procalcitonin:4,lacticidven:4)  ) Recent Results (from the past 240 hour(s))  SARS CORONAVIRUS 2 (TAT 6-24 HRS) Nasopharyngeal Nasopharyngeal Swab     Status: None   Collection Time: 03/07/20  4:03 AM   Specimen: Nasopharyngeal Swab  Result Value Ref Range Status   SARS Coronavirus 2 NEGATIVE NEGATIVE Final    Comment: (NOTE) SARS-CoV-2 target nucleic acids are NOT DETECTED. The SARS-CoV-2 RNA is generally detectable in upper and lower respiratory specimens during the acute phase of infection. Negative results do not preclude SARS-CoV-2 infection, do not rule out co-infections with other pathogens, and should not be used as the sole basis for treatment or other patient management decisions. Negative results  must be combined with clinical observations, patient history, and epidemiological information. The expected result is Negative. Fact Sheet for Patients: SugarRoll.be Fact Sheet for Healthcare Providers: https://www.[place]-mathews.com/ This test is not yet approved or cleared by the [place] FDA and  has been authorized for detection and/or diagnosis of SARS-CoV-2 by FDA under an Emergency Use Authorization (EUA). This EUA will remain  in effect (meaning this test can be used) for the duration of the COVID-19 declaration under Section 56 4(b)(1) of the Act, 21 U.S.C. section 360bbb-3(b)(1), unless the authorization is terminated or revoked sooner. Performed at [facility], [place] [address] [place], [place] [address]       Studies: No results found.  Scheduled Meds: . amLODipine  10 mg Oral Daily  . apixaban  5 mg Oral BID  . atorvastatin  80 mg Oral q1800  . calcitRIOL   0.25 mcg Oral Daily  . carvedilol  25 mg Oral BID WC  . clopidogrel  75 mg Oral Daily  . hydrALAZINE  10 mg Oral TID  . methylPREDNISolone (SOLU-MEDROL) injection  40 mg Intravenous Q24H  . pantoprazole  40 mg Oral Daily  . primidone  50 mg Oral BID  . sertraline  25 mg Oral QHS    Continuous Infusions:   LOS: 1 day     Kayleen Memos, MD [facility] Pager [phone]  If 7PM-7AM, please contact night-coverage [email or web address] Password [facility] 03/09/2020, 1:26 PM

## 2020-03-10 LAB — BASIC METABOLIC PANEL
Anion gap: 11 (ref 5–15)
BUN: 51 mg/dL — ABNORMAL HIGH (ref 8–23)
CO2: 22 mmol/L (ref 22–32)
Calcium: 8.9 mg/dL (ref 8.9–10.3)
Chloride: 110 mmol/L (ref 98–111)
Creatinine, Ser: 1.78 mg/dL — ABNORMAL HIGH (ref 0.61–1.24)
GFR calc Af Amer: 43 mL/min — ABNORMAL LOW (ref >60)
GFR calc non Af Amer: 37 mL/min — ABNORMAL LOW (ref >60)
Glucose, Bld: 126 mg/dL — ABNORMAL HIGH (ref 70–99)
Potassium: 4.5 mmol/L (ref 3.5–5.1)
Sodium: 143 mmol/L (ref 135–145)

## 2020-03-10 MED ORDER — PREDNISONE 5 MG PO TABS: 10.0000 mg | ORAL_TABLET | Freq: Two times a day (BID) | ORAL | 0 refills | Status: DC

## 2020-03-10 MED ORDER — PREDNISONE 5 MG PO TABS: 10.0000 mg | ORAL_TABLET | Freq: Two times a day (BID) | ORAL | Status: DC

## 2020-03-10 NOTE — Discharge Summary (Signed)
Discharge Summary  Alexander Austin TZG:[id number] DOB: 07-Sep-1946  PCP: Patient, No Pcp Per  Admit date: 03/07/2020 Discharge date: 03/10/2020  Time spent: 35 minutes  Recommendations for Outpatient Follow-up:  1. Follow-up with your primary care provider in 1 week 2. Follow-up with your cardiologist in 1 week 3. Take your medications as prescribed.  Discharge Diagnoses:  Active Hospital Problems   Diagnosis Date Noted  . Acute gout 03/07/2020  . CKD (chronic kidney disease) stage 4, GFR 15-29 ml/min (HCC) 03/07/2020  . Obesity, Class III, BMI 40-49.9 (morbid obesity) ([place]) 03/07/2020  . BPH (benign prostatic hyperplasia) 04/16/2015  . Tremor 04/16/2015  . PAF (paroxysmal atrial fibrillation) ([place]) 03/06/2015  . History of renal cell cancer 03/06/2015  . 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 Vitamin B12 deficiency 03/06/2015  . Essential hypertension, benign 03/06/2015  . Major depressive disorder, recurrent, in remission ([place]) 03/06/2015    Resolved Hospital Problems  No resolved problems to display.    Discharge Condition: Stable  Diet recommendation: Resume previous diet.  Vitals:   03/10/20 0857 03/10/20 1007  BP: (!) 166/84 (!) 145/81  Pulse: 60 62  Resp:    Temp:    SpO2:      History of present illness:  Alexander Austin a 73 y.o.malewith medical history ofparoxysmal A-fib on Eliquis, renal cell cancer s/p right partial nephrectomy, CKD IIIB, MI, CAD status post CABG, ischemic cardiomyopathy with an AICD, HTN, OSA, CVA, BPH, GERD, chronic anxiety/depression.  Presented with complaints of left foot pain of 2 weeks duration.  States he knows it is gout.- had plan for steroid injection of left foot at the [facility].  He is on allopurinol and reports compliance with his medications.  Significant pain in his left foot making it difficult to bear weight.  TRH was asked to admit.   Was started on IV Solu-Medrol with improvement of symptomatology.    X-ray of the left foot done on 03/08/2020 showed no evidence of cellulitis.  No acute abnormality seen in the left foot.  Moderate posterior calcaneal spurring.   Uric acid 9.9 on 03/08/2020.  Vital signs unremarkable.  Lab studies significant for elevated creatinine with AKI likely prerenal secondary to intravascular volume depletion.  Prior to admission p.o. Lasix was held and patient was given gentle IV fluid hydration with improvement of creatinine level from 2.18 to 1.78.    03/10/20: Seen and examined at his bedside.  No acute events overnight.  States he feels much better today.  Left foot pain is improved 3 out of 10 from nearly 9 out of 10, two days ago.  Ambulated with occupational therapy.  On the day of discharge, the patient was hemodynamically stable.  He will need to follow-up with his primary care provider and his cardiologist posthospitalization.  His Lasix is being held due to hyperuricemia.  Defer to his cardiologist to restart.  Patient states he gets his medical treatments at the [facility]:  Principal Problem:   Acute gout Active Problems:   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 (HCC)   PAF (paroxysmal atrial fibrillation) (HCC)   History of renal cell cancer   Vitamin B12 deficiency   Major depressive disorder, recurrent, in remission ([place])   Essential hypertension, benign   Tremor   BPH (benign prostatic hyperplasia)  CKD (chronic kidney disease) stage 4, GFR 15-29 ml/min (HCC)   Obesity, Class III, BMI 40-49.9 (morbid obesity) (HCC)  Acute gout flare No evidence of cellulitis on left foot x-ray. Moderate posterior calcaneal spurring on x-ray. Uric acid 9.9 Hold off Lasix due to hyperuricemia Received gentle IV fluid hydration LR at 50 cc/hr Received IV solumedrol x 3 days with good response Continue  Prednisone with taper X 9 days Continue GI prophylaxis with PPI Follow up with your PCP in 1 week Follow up with your cardiologist in 1 week to decide when to restart your po Lasix that is being held.  AKI on CKD 3B Per medical record last creatinine was 1.7 (2016) Presented with creatinine 2.18 with GFR of 29. Creatinine improved down to 1.78 with gentle IV fluid hydration LR at 50 cc/h. 1.3 L urine output recorded in the last 24 hours. Continue to avoid nephrotoxins P.o. Lasix on hold Follow-up with your primary care provider.  History of renal cancer Follow-up with your oncologist  Paroxysmal A. fib, rate controlled Continue your cardiac medications P.o. Lasix on hold due to hyperuricemia Follow-up with your cardiologist in 1 week. On Eliquis for secondary CVA prevention.  Coronary artery disease status post CABG Continue home cardiac medications  History of CVA Continue Plavix and Lipitor  History of gout Reports compliance with his allopurinol Follow-up with your PCP  Essential hypertension Blood pressure is at goal Continue home medications  Chronic depression/anxiety Continue Zoloft  Hyperlipidemia Continue Lipitor    DVT prophylaxis:Eliquis Code Status:Do not intubate   Consults called: curbsided with orthopedic surgery    Discharge Exam: BP (!) 145/81   Pulse 62   Temp (!) 97.4 F (36.3 C) (Oral)   Resp 18   Ht 6\' 2"  (1.88 m)   Wt 108.9 kg   SpO2 93%   BMI 30.81 kg/m  . General: 74 y.o. year-old male well developed well nourished in no acute distress.  Alert and oriented x3. . Cardiovascular: Regular rate and rhythm with no rubs or gallops.  No thyromegaly or JVD noted.   [name] Respiratory: Clear to auscultation with no wheezes or rales. Good inspiratory effort. . Abdomen: Soft nontender nondistended with normal bowel sounds x4 quadrants. . Musculoskeletal: No lower extremity edema bilaterally. [name] Psychiatry: Mood is  appropriate for condition and setting  Discharge Instructions You were cared for by a hospitalist during your hospital stay. If you have any questions about your discharge medications or the care you received while you were in the hospital after you are discharged, you can call the unit and asked to speak with the hospitalist on call if the hospitalist that took care of you is not available. Once you are discharged, your primary care physician will handle any further medical issues. Please note that NO REFILLS for any discharge medications will be authorized once you are discharged, as it is imperative that you return to your primary care physician (or establish a relationship with a primary care physician if you do not have one) for your aftercare needs so that they can reassess your need for medications and monitor your lab values.   Allergies as of 03/10/2020      Reactions   Isosorbide Dinitrate Other (See Comments), Anaphylaxis   Cardiovascular Arrest Cardiovascular Arrest Can take Sublingual Nitro   Isosorbide Nitrate Anaphylaxis   Other reaction(s): Cardiovascular Arrest (ALLERGY/intolerance) Cardiovascular Arrest Can take Sublingual Nitro. Takes sublingual nitroglycerin at home with no problems Cardiovascular Arrest Can take Sublingual Nitro.  Shellfish-derived Products Other (See Comments)   Patient states shellfish triggers his gout      Medication List    STOP taking these medications   furosemide 40 MG tablet Commonly known as: LASIX   potassium chloride SA 20 MEQ tablet Commonly known as: KLOR-CON     TAKE these medications   allopurinol 100 MG tablet Commonly known as: ZYLOPRIM Take 250 mg by mouth daily.   amLODipine 10 MG tablet Commonly known as: NORVASC Take 10 mg by mouth daily.   apixaban 5 MG Tabs tablet Commonly known as: ELIQUIS Take by mouth 2 (two) times daily.   atorvastatin 80 MG tablet Commonly known as: LIPITOR Take by mouth daily at 6  PM.   calcitRIOL 0.25 MCG capsule Commonly known as: ROCALTROL Take 0.25 mcg by mouth daily.   carvedilol 25 MG tablet Commonly known as: COREG Take 25 mg by mouth in the morning and at bedtime.   clopidogrel 75 MG tablet Commonly known as: PLAVIX Take 75 mg by mouth daily.   DSS 100 MG Caps Take 200 mg by mouth daily.   ferrous sulfate 325 (65 FE) MG tablet Take 325 mg by mouth every Monday, Wednesday, and Friday.   hydrALAZINE 10 MG tablet Commonly known as: APRESOLINE Take 10 mg by mouth 3 (three) times daily.   ketotifen 0.025 % ophthalmic solution Commonly known as: ZADITOR Place 1 drop into both eyes 2 times daily.   nitroGLYCERIN 0.4 MG SL tablet Commonly known as: NITROSTAT Place 0.4 mg under the tongue every 5 (five) minutes as needed for chest pain.   pantoprazole 40 MG tablet Commonly known as: PROTONIX Take 40 mg by mouth daily. Take before morning meds to protect stomach   predniSONE 5 MG tablet Commonly known as: DELTASONE Take 2 tablets (10 mg total) by mouth 2 (two) times daily with a meal. Take 10 mg BID x 3 days Take 10 mg once a day x 3 days Take  5 mg once a day x 3 days then stop   primidone 50 MG tablet Commonly known as: MYSOLINE Take 100 mg by mouth in the morning and at bedtime.   sertraline 50 MG tablet Commonly known as: ZOLOFT Take 25 mg by mouth at bedtime.   Vitamin D (Ergocalciferol) 1.25 MG (50000 UNIT) Caps capsule Commonly known as: DRISDOL Take 50,000 Units by mouth every 7 (seven) days.      Allergies  Allergen Reactions  . Isosorbide Dinitrate Other (See Comments) and Anaphylaxis    Cardiovascular Arrest Cardiovascular Arrest Can take Sublingual Nitro  . Isosorbide Nitrate Anaphylaxis    Other reaction(s): Cardiovascular Arrest (ALLERGY/intolerance) Cardiovascular Arrest Can take Sublingual Nitro. Takes sublingual nitroglycerin at home with no problems Cardiovascular Arrest Can take Sublingual Nitro.   .  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      The results of significant diagnostics from this hospitalization (including imaging, microbiology, ancillary and laboratory) are listed below for reference.    Significant Diagnostic Studies: DG Chest Port 1 View  Result Date: 03/07/2020 CLINICAL DATA:  Shortness of breath, fatigue, gout in the left foot EXAM: PORTABLE CHEST 1 VIEW COMPARISON:  Radiograph May 10, 2019, January 26, 2019 FINDINGS: Stable appearance of the scattered calcified granulomata including a larger granuloma in the left lower lung measuring up to 1.4 cm in size. Some streaky opacities in the lung bases favor atelectasis given low volumes. No consolidation, features of edema, pneumothorax, or effusion.  Cardiomegaly is similar to prior. Postsurgical changes related to prior CABG including intact and aligned sternotomy wires and multiple surgical clips projecting over the mediastinum. Pacer/defibrillator pack overlies the left chest wall with leads at the right atrium, apex and coronary sinus. No acute osseous or soft tissue abnormality. IMPRESSION: 1. Low lung volumes with streaky opacities in the lung bases favoring atelectasis. 2. Stable cardiomegaly. 3. Stable appearance of calcified granulomata. Electronically Signed   By: Lovena Le M.D.   On: 03/07/2020 03:48   DG Foot 2 Views Left  Result Date: 03/08/2020 CLINICAL DATA:  Acute left foot pain. EXAM: LEFT FOOT - 2 VIEW COMPARISON:  None. FINDINGS: There is no evidence of fracture or dislocation. There is no evidence of arthropathy. Moderate posterior calcaneal spurring is noted. Soft tissues are unremarkable. IMPRESSION: Moderate posterior calcaneal spurring. No acute abnormality seen in the left foot. Electronically Signed   By: Marijo Conception M.D.   On: 03/08/2020 12:53    Microbiology: Recent Results (from the past 240 hour(s))  SARS CORONAVIRUS 2 (TAT 6-24 HRS)  Nasopharyngeal Nasopharyngeal Swab     Status: None   Collection Time: 03/07/20  4:03 AM   Specimen: Nasopharyngeal Swab  Result Value Ref Range Status   SARS Coronavirus 2 NEGATIVE NEGATIVE Final    Comment: (NOTE) SARS-CoV-2 target nucleic acids are NOT DETECTED. The SARS-CoV-2 RNA is generally detectable in upper and lower respiratory specimens during the acute phase of infection. Negative results do not preclude SARS-CoV-2 infection, do not rule out co-infections with other pathogens, and should not be used as the sole basis for treatment or other patient management decisions. Negative results must be combined with clinical observations, patient history, and epidemiological information. The expected result is Negative. Fact Sheet for Patients: SugarRoll.be Fact Sheet for Healthcare Providers: https://www.woods-mathews.com/ This test is not yet approved or cleared by the [place] FDA and  has been authorized for detection and/or diagnosis of SARS-CoV-2 by FDA under an Emergency Use Authorization (EUA). This EUA will remain  in effect (meaning this test can be used) for the duration of the COVID-19 declaration under Section 56 4(b)(1) of the Act, 21 U.S.C. section 360bbb-3(b)(1), unless the authorization is terminated or revoked sooner. Performed at [facility], [place] [address] [place], [place] [address]      Labs: Basic Metabolic Panel: Recent Labs  Lab 03/07/20 0403 03/08/20 0359 03/09/20 0657 03/10/20 0434  NA 137 140 144 143  K 4.3 3.9 4.2 4.5  CL 104 107 110 110  CO2 23 21* 24 22  GLUCOSE 118* 141* 104* 126*  BUN 34* 49* 54* 51*  CREATININE 2.17* 2.18* 2.12* 1.78*  CALCIUM 8.9 9.0 8.7* 8.9   Liver Function Tests: No results for input(s): AST, ALT, ALKPHOS, BILITOT, PROT, ALBUMIN in the last 168 hours. No results for input(s): LIPASE, AMYLASE in the last 168 hours. No results for input(s): AMMONIA in the last  168 hours. CBC: Recent Labs  Lab 03/07/20 0403 03/08/20 0359  WBC 5.4 7.2  NEUTROABS 4.4  --   HGB 15.7 11.9*  HCT 47.3 36.3*  MCV 87.4 88.3  PLT 150 185   Cardiac Enzymes: No results for input(s): CKTOTAL, CKMB, CKMBINDEX, TROPONINI in the last 168 hours. BNP: BNP (last 3 results) Recent Labs    03/07/20 0403  BNP 106.1*    ProBNP (last 3 results) No results for input(s): PROBNP in the last 8760 hours.  CBG: No results for input(s): GLUCAP in the last  168 hours.     Signed:  Kayleen Memos, MD [facility] 03/10/2020, 10:08 AM

## 2020-03-10 NOTE — Evaluation (Signed)
Physical Therapy Evaluation Patient Details Name: Alexander Austin MRN: [id number] DOB: 1946/06/03 Today's Date: 03/10/2020   History of Present Illness  74 year old male admitted with acute GOUT; PMH includes paroxysmal A FIB, renal cell cancer, s/p partial nephrectomy 9/12- CKD 4, AICD, CABG redo, MI, HTN, CVA, BPH, GERD, and anxiety.  Clinical Impression  Patient was in [facility] chair when PT arrived. He was receptive to PT but presents as anxious and "guarded"- kept arms crossed throughout the major part of the session. He has an aide 5 days a week, 3 hours each day. He plans to have her assist him to a local part to practice more ambulation as he generally has been household ambulator. Lives alone in apt with 2 "small outside steps" with no handrails. Walk in shower with grab bars, and standard height commode. Has 3 SPCs, 2 RW and a rollator, as well as a scooter. Landlord and friends help with transport to appts- and he orders his groceries on line. Can drive short distances, but is limited. General weakness, but close to baseline. Instructed in HEP and printed sheet was issued following good return demo.    Follow Up Recommendations No PT follow up    Equipment Recommendations       Recommendations for Other Services       Precautions / Restrictions Precautions Precautions: Fall Precaution Comments: He is being discharged today and has several ADs at home, has been in [facility] in the past- esp post CVA several years ago Restrictions Weight Bearing Restrictions: No      Mobility  Bed Mobility Overal bed mobility: Modified Independent             General bed mobility comments: no physical assist for bed mobility  Transfers Overall transfer level: Needs assistance Equipment used: Rolling walker (2 wheeled) Transfers: Sit to/from Stand Sit to Stand: Supervision;Min guard         General transfer comment: Supervision for initial part of sit<>stand - min guard if more than one  attempt  Ambulation/Gait Ambulation/Gait assistance: Supervision;Min guard Gait Distance (Feet): 12 Feet Assistive device: Rolling walker (2 wheeled) Gait Pattern/deviations: Shuffle;Wide base of support   Gait velocity interpretation: 1.31 - 2.62 ft/sec, indicative of limited community ambulator General Gait Details: knees slightly flexed  Stairs            Wheelchair Mobility    Modified Rankin (Stroke Patients Only)       Balance Overall balance assessment: Needs assistance Sitting-balance support: Feet unsupported;Bilateral upper extremity supported Sitting balance-Leahy Scale: Good     Standing balance support: Single extremity supported Standing balance-Leahy Scale: Fair Standing balance comment: No loss of balance in standing with RW                             Pertinent Vitals/Pain Pain Assessment: 0-10 Pain Score: 2  Pain Location: L foot- improving overall- from GOUT Pain Descriptors / Indicators: Aching;Pressure Pain Intervention(s): Monitored during session    Home Living Family/patient expects to be discharged to:: Private residence Living Arrangements: Alone Available Help at Discharge: Friend(s);Personal care attendant;Available PRN/intermittently Type of Home: Apartment Home Access: Level entry     Home Layout: One level Home Equipment: Walker - 4 wheels;Grab bars - tub/shower;Shower seat;Cane - single point Additional Comments: Has 3  canes, 2 RWs and a scooter    Prior Function Level of Independence: Needs assistance   Gait / Transfers Assistance Needed: Ambulates  primarily in the apt, but plans to ask aide to take him across the street to a part for additional walking on good weather days  ADL's / Homemaking Assistance Needed: Has an aide 5 days per week, 3 hours daily  Comments: He states that he orders his groceries and the landlord and others are helpful re taking him to appts     Hand Dominance   Dominant Hand:  Right    Extremity/Trunk Assessment   Upper Extremity Assessment Upper Extremity Assessment: Generalized weakness    Lower Extremity Assessment Lower Extremity Assessment: Generalized weakness    Cervical / Trunk Assessment Cervical / Trunk Assessment: Normal  Communication   Communication: No difficulties  Cognition Arousal/Alertness: Awake/alert Behavior During Therapy: WFL for tasks assessed/performed Overall Cognitive Status: Within Functional Limits for tasks assessed                                 General Comments: Pleasant and cooperative- receptive to PT- does present as anxious. Kept arms crossed the majority of the session.      General Comments General comments (skin integrity, edema, etc.): Remind of proper use of RW as he is a falls risk    Exercises General Exercises - Lower Extremity Ankle Circles/Pumps: Seated;AROM;10 reps;Both Quad Sets: AROM;Seated;10 reps;Both Gluteal Sets: AROM;Seated;10 reps Short Arc Quad: AROM;Seated;10 reps Heel Slides: AROM;Seated;10 reps Hip ABduction/ADduction: AROM;Seated;10 reps Other Exercises Other Exercises: Instructed UE/LE ex for HEP and was issued printed sheet for reference- commentary and pictures - UE included shoulder flexion and abduction.   Assessment/Plan    PT Assessment Patent does not need any further PT services  PT Problem List         PT Treatment Interventions      PT Goals (Current goals can be found in the Care Plan section)  Acute Rehab PT Goals Patient Stated Goal: Want to get home as soon as possible- am being discharged today PT Goal Formulation: All assessment and education complete, DC therapy    Frequency  One time visit   Barriers to discharge        Co-evaluation               AM-PAC PT "6 Clicks" Mobility  Outcome Measure Help needed turning from your back to your side while in a flat bed without using bedrails?: None Help needed moving from lying on your  back to sitting on the side of a flat bed without using bedrails?: None Help needed moving to and from a bed to a chair (including a wheelchair)?: None Help needed standing up from a chair using your arms (e.g., wheelchair or bedside chair)?: A Little Help needed to walk in hospital room?: A Little Help needed climbing 3-5 steps with a railing? : A Little 6 Click Score: 21    End of Session   Activity Tolerance: Patient tolerated treatment well Patient left: in chair;with call bell/phone within reach   PT Visit Diagnosis: Muscle weakness (generalized) (M62.81)    Time: 1010-1040 PT Time Calculation (min) (ACUTE ONLY): 30 min   Charges:   PT Evaluation $PT Eval Moderate Complexity: 1 Mod PT Treatments $Therapeutic Exercise: 8-22 mins        Rollen Sox, PT # [phone] CGV cell  Casandra Doffing 03/10/2020, 10:54 AM

## 2020-03-10 NOTE — Evaluation (Signed)
Occupational Therapy Evaluation Patient Details Name: Alexander Austin MRN: [id number] DOB: Apr 25, 1946 Today's Date: 03/10/2020    History of Present Illness 74 year old male admitted with acute GOUT; PMH includes paroxysmal A FIB, renal cell cancer, s/p partial nephrectomy 9/12- CKD 4, AICD, CABG redo, MI, HTN, CVA, BPH, GERD, and anxiety.   Clinical Impression   Patient is a 74 year old male that lives in apartment alone, no steps to enter. Patient reports his ex-wife and her husband live upstairs, they along with his landlord check on him regularly. Patient also has personal care attendant 5x week for 3 hrs to assist with bathing, IADLs, transportation. Patient states he is modified independent with dressing, toilet transfers, ambulation using cane or rollator depending on the day. Currently patient is min guard for functional transfers with x1 posterior loss of balance in standing during LB dressing. Min cues for safety navigating with walker in room, patient states he isn't familiar with this type, is used to his rollator. Will continue to follow acutely to maximize patient safety and independence with self care, patient declines [facility] services as he has had them in the past and does not feel it necessary at this time.    Follow Up Recommendations  No OT follow up    Equipment Recommendations  None recommended by OT       Precautions / Restrictions Precautions Precautions: Fall Restrictions Weight Bearing Restrictions: No      Mobility Bed Mobility Overal bed mobility: Modified Independent             General bed mobility comments: increased time and HOB elevated, no physical assist  Transfers Overall transfer level: Needs assistance Equipment used: Rolling walker (2 wheeled) Transfers: Sit to/from Stand Sit to Stand: Min guard         General transfer comment: min guard for steadying assist, patient require x2 attempts for first sit to stand during dressing. demonstrates  adequate body mechanics when sitting into recliner    Balance Overall balance assessment: Needs assistance Sitting-balance support: No upper extremity supported;Feet supported Sitting balance-Leahy Scale: Good     Standing balance support: Bilateral upper extremity supported;During functional activity Standing balance-Leahy Scale: Poor Standing balance comment: loss of balance in standing without external support                           ADL either performed or assessed with clinical judgement   ADL Overall ADL's : Needs assistance/impaired Eating/Feeding: Independent   Grooming: Wash/dry face;Wash/dry hands;Set up;Sitting   Upper Body Bathing: Set up;Sitting   Lower Body Bathing: Minimal assistance;Sit to/from stand;Sitting/lateral leans   Upper Body Dressing : Set up;Sitting   Lower Body Dressing: Min guard;Sit to/from stand Lower Body Dressing Details (indicate cue type and reason): pt able to don brief and pajama bottoms in sitting supervision, min guard in standing due to x1 posterior loss of balance pulling up over hips. provide walker for increased stability Toilet Transfer: Min guard;Ambulation;Regular Toilet;RW;Cueing for safety Toilet Transfer Details (indicate cue type and reason): simulated with ambulation to recliner, min guard for mild unsteadiness and min cues for safety with managing walker around obstacles in the room Toileting- Clothing Manipulation and Hygiene: Min guard;Sitting/lateral lean;Sit to/from stand       Functional mobility during ADLs: Min guard;Rolling walker;Cueing for safety                    Pertinent Vitals/Pain Pain Assessment: 0-10  Pain Score: 3  Pain Location: L foot Pain Descriptors / Indicators: Aching Pain Intervention(s): Monitored during session     Hand Dominance Right   Extremity/Trunk Assessment Upper Extremity Assessment Upper Extremity Assessment: Generalized weakness   Lower Extremity  Assessment Lower Extremity Assessment: Defer to PT evaluation       Communication Communication Communication: No difficulties   Cognition Arousal/Alertness: Awake/alert Behavior During Therapy: WFL for tasks assessed/performed Overall Cognitive Status: Within Functional Limits for tasks assessed                                     General Comments  educate patient on using walker upon returning home to maximize safety/minimize fall risk as he hasn't been as mobile past few weeks, patient verbalize understanding.            Home Living Family/patient expects to be discharged to:: Private residence Living Arrangements: Alone Available Help at Discharge: Friend(s);Personal care attendant;Available PRN/intermittently Type of Home: Apartment Home Access: Level entry     Home Layout: One level     Bathroom Shower/Tub: Occupational psychologist: Standard Bathroom Accessibility: Yes How Accessible: Accessible via walker Home Equipment: Walker - 4 wheels;Grab bars - tub/shower;Shower seat;Cane - single point          Prior Functioning/Environment Level of Independence: Needs assistance  Gait / Transfers Assistance Needed: ambulates with cane of rollator depending on the day ADL's / Homemaking Assistance Needed: has care attendent 5x week for 3 hrs to assist with bathing, IADLs   Comments: patient reports his land lord and his ex-wife and her husband all check on him frequently        OT Problem List: Decreased activity tolerance;Impaired balance (sitting and/or standing);Decreased safety awareness;Pain      OT Treatment/Interventions: Self-care/ADL training;DME and/or AE instruction;Therapeutic activities;Patient/family education;Balance training    OT Goals(Current goals can be found in the care plan section) Acute Rehab OT Goals Patient Stated Goal: "to see my dogs" OT Goal Formulation: With patient Time For Goal Achievement:  03/24/20 Potential to Achieve Goals: Good  OT Frequency: Min 2X/week    AM-PAC OT "6 Clicks" Daily Activity     Outcome Measure Help from another person eating meals?: None Help from another person taking care of personal grooming?: A Little Help from another person toileting, which includes using toliet, bedpan, or urinal?: A Little Help from another person bathing (including washing, rinsing, drying)?: A Lot Help from another person to put on and taking off regular upper body clothing?: A Little Help from another person to put on and taking off regular lower body clothing?: A Little 6 Click Score: 18   End of Session Equipment Utilized During Treatment: Rolling walker  Activity Tolerance: Patient tolerated treatment well Patient left: in chair;with call bell/phone within reach  OT Visit Diagnosis: Unsteadiness on feet (R26.81);Pain Pain - Right/Left: Left Pain - part of body: Ankle and joints of foot                Time: 7858-8502 OT Time Calculation (min): 23 min Charges:  OT General Charges $OT Visit: 1 Visit OT Evaluation $OT Eval Moderate Complexity: 1 Mod OT Treatments $Self Care/Home Management : 8-22 mins  Shon Millet OT OT office: [place] 03/10/2020, 10:42 AM

## 2020-03-10 NOTE — Progress Notes (Signed)
Instructions were reviewed with patient. All questions were answered. Patient was transported to main entrance by wheelchair. ° °

## 2020-03-10 NOTE — Discharge Instructions (Signed)
Low-Purine Eating Plan A low-purine eating plan involves making food choices to limit your intake of purine. Purine is a kind of uric acid. Too much uric acid in your blood can cause certain conditions, such as gout and kidney stones. Eating a low-purine diet can help control these conditions. What are tips for following this plan? Reading food labels   Avoid foods with saturated or Trans fat.  Check the ingredient list of grains-based foods, such as bread and cereal, to make sure that they contain whole grains.  Check the ingredient list of sauces or soups to make sure they do not contain meat or fish.  When choosing soft drinks, check the ingredient list to make sure they do not contain high-fructose corn syrup. Shopping  Buy plenty of fresh fruits and vegetables.  Avoid buying canned or fresh fish.  Buy dairy products labeled as low-fat or nonfat.  Avoid buying premade or processed foods. These foods are often high in fat, salt (sodium), and added sugar. Cooking  Use olive oil instead of butter when cooking. Oils like olive oil, canola oil, and sunflower oil contain healthy fats. Meal planning  Learn which foods do or do not affect you. If you find out that a food tends to cause your gout symptoms to flare up, avoid eating that food. You can enjoy foods that do not cause problems. If you have any questions about a food item, talk with your dietitian or health care provider.  Limit foods high in fat, especially saturated fat. Fat makes it harder for your body to get rid of uric acid.  Choose foods that are lower in fat and are lean sources of protein. General guidelines  Limit alcohol intake to no more than 1 drink a day for nonpregnant women and 2 drinks a day for men. One drink equals 12 oz of beer, 5 oz of wine, or 1 oz of hard liquor. Alcohol can affect the way your body gets rid of uric acid.  Drink plenty of water to keep your urine clear or pale yellow. Fluids can help  remove uric acid from your body.  If directed by your health care provider, take a vitamin C supplement.  Work with your health care provider and dietitian to develop a plan to achieve or maintain a healthy weight. Losing weight can help reduce uric acid in your blood. What foods are recommended? The items listed may not be a complete list. Talk with your dietitian about what dietary choices are best for you. Foods low in purines Foods low in purines do not need to be limited. These include:  All fruits.  All low-purine vegetables, pickles, and olives.  Breads, pasta, rice, cornbread, and popcorn. Cake and other baked goods.  All dairy foods.  Eggs, nuts, and nut butters.  Spices and condiments, such as salt, herbs, and vinegar.  Plant oils, butter, and margarine.  Water, sugar-free soft drinks, tea, coffee, and cocoa.  Vegetable-based soups, broths, sauces, and gravies. Foods moderate in purines Foods moderate in purines should be limited to the amounts listed.   cup of asparagus, cauliflower, spinach, mushrooms, or green peas, each day.  2/3 cup uncooked oatmeal, each day.   cup dry wheat bran or wheat germ, each day.  2-3 ounces of meat or poultry, each day.  4-6 ounces of shellfish, such as crab, lobster, oysters, or shrimp, each day.  1 cup cooked beans, peas, or lentils, each day.  Soup, broths, or bouillon made from meat or   fish. Limit these foods as much as possible. What foods are not recommended? The items listed may not be a complete list. Talk with your dietitian about what dietary choices are best for you. Limit your intake of foods high in purines, including:  Beer and other alcohol.  Meat-based gravy or sauce.  Canned or fresh fish, such as: ? Anchovies, sardines, herring, and tuna. ? Mussels and scallops. ? Codfish, trout, and haddock.  Berniece Salines.  Organ meats, such as: ? Liver or kidney. ? Tripe. ? Sweetbreads (thymus gland or  pancreas).  Wild Clinical biochemist.  Yeast or yeast extract supplements.  Drinks sweetened with high-fructose corn syrup. Summary  Eating a low-purine diet can help control conditions caused by too much uric acid in the body, such as gout or kidney stones.  Choose low-purine foods, limit alcohol, and limit foods high in fat.  You will learn over time which foods do or do not affect you. If you find out that a food tends to cause your gout symptoms to flare up, avoid eating that food. This information is not intended to replace advice given to you by your health care provider. Make sure you discuss any questions you have with your health care provider. Document Revised: 11/18/2017 Document Reviewed: 01/19/2017 Elsevier Patient Education  [place]. Gout  Gout is painful swelling of your joints. Gout is a type of arthritis. It is caused by having too much uric acid in your body. Uric acid is a chemical that is made when your body breaks down substances called purines. If your body has too much uric acid, sharp crystals can form and build up in your joints. This causes pain and swelling. Gout attacks can happen quickly and be very painful (acute gout). Over time, the attacks can affect more joints and happen more often (chronic gout). What are the causes?  Too much uric acid in your blood. This can happen because: ? Your kidneys do not remove enough uric acid from your blood. ? Your body makes too much uric acid. ? You eat too many foods that are high in purines. These foods include organ meats, some seafood, and beer.  Trauma or stress. What increases the risk?  Having a family history of gout.  Being male and middle-aged.  Being male and having gone through menopause.  Being very overweight (obese).  Drinking alcohol, especially beer.  Not having enough water in the body (being dehydrated).  Losing weight too quickly.  Having an organ transplant.  Having lead  poisoning.  Taking certain medicines.  Having kidney disease.  Having a skin condition called psoriasis. What are the signs or symptoms? An attack of acute gout usually happens in just one joint. The most common place is the big toe. Attacks often start at night. Other joints that may be affected include joints of the feet, ankle, knee, fingers, wrist, or elbow. Symptoms of an attack may include:  Very bad pain.  Warmth.  Swelling.  Stiffness.  Shiny, red, or purple skin.  Tenderness. The affected joint may be very painful to touch.  Chills and fever. Chronic gout may cause symptoms more often. More joints may be involved. You may also have white or yellow lumps (tophi) on your hands or feet or in other areas near your joints. How is this treated?  Treatment for this condition has two phases: treating an acute attack and preventing future attacks.  Acute gout treatment may include: ? NSAIDs. ? Steroids. These are  taken by mouth or injected into a joint. ? Colchicine. This medicine relieves pain and swelling. It can be given by mouth or through an IV tube.  Preventive treatment may include: ? Taking small doses of NSAIDs or colchicine daily. ? Using a medicine that reduces uric acid levels in your blood. ? Making changes to your diet. You may need to see a food expert (dietitian) about what to eat and drink to prevent gout. Follow these instructions at home: During a gout attack   If told, put ice on the painful area: ? Put ice in a plastic bag. ? Place a towel between your skin and the bag. ? Leave the ice on for 20 minutes, 2-3 times a day.  Raise (elevate) the painful joint above the level of your heart as often as you can.  Rest the joint as much as possible. If the joint is in your leg, you may be given crutches.  Follow instructions from your doctor about what you cannot eat or drink. Avoiding future gout attacks  Eat a low-purine diet. Avoid foods and drinks  such as: ? Liver. ? Kidney. ? Anchovies. ? Asparagus. ? Herring. ? Mushrooms. ? Mussels. ? Beer.  Stay at a healthy weight. If you want to lose weight, talk with your doctor. Do not lose weight too fast.  Start or continue an exercise plan as told by your doctor. Eating and drinking  Drink enough fluids to keep your pee (urine) pale yellow.  If you drink alcohol: ? Limit how much you use to:  0-1 drink a day for women.  0-2 drinks a day for men. ? Be aware of how much alcohol is in your drink. In the [place] one drink equals one 12 oz bottle of beer (355 mL), one 5 oz glass of wine (148 mL), or one 1 oz glass of hard liquor (44 mL). General instructions  Take over-the-counter and prescription medicines only as told by your doctor.  Do not drive or use heavy machinery while taking prescription pain medicine.  Return to your normal activities as told by your doctor. Ask your doctor what activities are safe for you.  Keep all follow-up visits as told by your doctor. This is important. Contact a doctor if:  You have another gout attack.  You still have symptoms of a gout attack after 10 days of treatment.  You have problems (side effects) because of your medicines.  You have chills or a fever.  You have burning pain when you pee (urinate).  You have pain in your lower back or belly. Get help right away if:  You have very bad pain.  Your pain cannot be controlled.  You cannot pee. Summary  Gout is painful swelling of the joints.  The most common site of pain is the big toe, but it can affect other joints.  Medicines and avoiding some foods can help to prevent and treat gout attacks. This information is not intended to replace advice given to you by your health care provider. Make sure you discuss any questions you have with your health care provider. Document Revised: 06/28/2018 Document Reviewed: 06/28/2018 Elsevier Patient Education  [place].

## 2020-03-28 ENCOUNTER — Encounter (HOSPITAL_COMMUNITY): Payer: Self-pay | Admitting: Internal Medicine

## 2020-03-28 ENCOUNTER — Emergency Department (HOSPITAL_COMMUNITY): Payer: Medicare Other

## 2020-03-28 ENCOUNTER — Other Ambulatory Visit: Payer: Self-pay

## 2020-03-28 ENCOUNTER — Inpatient Hospital Stay (HOSPITAL_COMMUNITY)
Admission: EM | Admit: 2020-03-28 | Discharge: 2020-04-04 | DRG: 291 | Disposition: A | Discharge status: Home Health | Payer: Medicare Other | Attending: Internal Medicine | Admitting: Internal Medicine

## 2020-03-28 DIAGNOSIS — E538 Deficiency of other specified B group vitamins: Secondary | ICD-10-CM | POA: Diagnosis present

## 2020-03-28 DIAGNOSIS — Z951 Presence of aortocoronary bypass graft: Secondary | ICD-10-CM | POA: Diagnosis not present

## 2020-03-28 DIAGNOSIS — J9601 Acute respiratory failure with hypoxia: Secondary | ICD-10-CM | POA: Diagnosis not present

## 2020-03-28 DIAGNOSIS — I252 Old myocardial infarction: Secondary | ICD-10-CM

## 2020-03-28 DIAGNOSIS — G4730 Sleep apnea, unspecified: Secondary | ICD-10-CM | POA: Diagnosis present

## 2020-03-28 DIAGNOSIS — Z95810 Presence of automatic (implantable) cardiac defibrillator: Secondary | ICD-10-CM

## 2020-03-28 DIAGNOSIS — Z87891 Personal history of nicotine dependence: Secondary | ICD-10-CM

## 2020-03-28 DIAGNOSIS — M10072 Idiopathic gout, left ankle and foot: Secondary | ICD-10-CM | POA: Diagnosis not present

## 2020-03-28 DIAGNOSIS — I255 Ischemic cardiomyopathy: Secondary | ICD-10-CM | POA: Diagnosis present

## 2020-03-28 DIAGNOSIS — Z955 Presence of coronary angioplasty implant and graft: Secondary | ICD-10-CM

## 2020-03-28 DIAGNOSIS — I480 Paroxysmal atrial fibrillation: Secondary | ICD-10-CM | POA: Diagnosis present

## 2020-03-28 DIAGNOSIS — Z7902 Long term (current) use of antithrombotics/antiplatelets: Secondary | ICD-10-CM

## 2020-03-28 DIAGNOSIS — Z91013 Allergy to seafood: Secondary | ICD-10-CM

## 2020-03-28 DIAGNOSIS — Z8249 Family history of ischemic heart disease and other diseases of the circulatory system: Secondary | ICD-10-CM

## 2020-03-28 DIAGNOSIS — K219 Gastro-esophageal reflux disease without esophagitis: Secondary | ICD-10-CM | POA: Diagnosis present

## 2020-03-28 DIAGNOSIS — M109 Gout, unspecified: Secondary | ICD-10-CM | POA: Diagnosis present

## 2020-03-28 DIAGNOSIS — Z823 Family history of stroke: Secondary | ICD-10-CM

## 2020-03-28 DIAGNOSIS — I10 Essential (primary) hypertension: Secondary | ICD-10-CM | POA: Diagnosis not present

## 2020-03-28 DIAGNOSIS — R29898 Other symptoms and signs involving the musculoskeletal system: Secondary | ICD-10-CM

## 2020-03-28 DIAGNOSIS — I2510 Atherosclerotic heart disease of native coronary artery without angina pectoris: Secondary | ICD-10-CM | POA: Diagnosis present

## 2020-03-28 DIAGNOSIS — F329 Major depressive disorder, single episode, unspecified: Secondary | ICD-10-CM | POA: Diagnosis present

## 2020-03-28 DIAGNOSIS — Z8673 Personal history of transient ischemic attack (TIA), and cerebral infarction without residual deficits: Secondary | ICD-10-CM

## 2020-03-28 DIAGNOSIS — Z6831 Body mass index (BMI) 31.0-31.9, adult: Secondary | ICD-10-CM

## 2020-03-28 DIAGNOSIS — I5023 Acute on chronic systolic (congestive) heart failure: Secondary | ICD-10-CM | POA: Diagnosis present

## 2020-03-28 DIAGNOSIS — R0602 Shortness of breath: Secondary | ICD-10-CM | POA: Diagnosis not present

## 2020-03-28 DIAGNOSIS — I5043 Acute on chronic combined systolic (congestive) and diastolic (congestive) heart failure: Secondary | ICD-10-CM | POA: Diagnosis present

## 2020-03-28 DIAGNOSIS — M1A9XX0 Chronic gout, unspecified, without tophus (tophi): Secondary | ICD-10-CM | POA: Diagnosis present

## 2020-03-28 DIAGNOSIS — N400 Enlarged prostate without lower urinary tract symptoms: Secondary | ICD-10-CM | POA: Diagnosis present

## 2020-03-28 DIAGNOSIS — N1831 Chronic kidney disease, stage 3a: Secondary | ICD-10-CM | POA: Diagnosis present

## 2020-03-28 DIAGNOSIS — Z7901 Long term (current) use of anticoagulants: Secondary | ICD-10-CM | POA: Diagnosis not present

## 2020-03-28 DIAGNOSIS — F419 Anxiety disorder, unspecified: Secondary | ICD-10-CM | POA: Diagnosis present

## 2020-03-28 DIAGNOSIS — Z85528 Personal history of other malignant neoplasm of kidney: Secondary | ICD-10-CM | POA: Diagnosis not present

## 2020-03-28 DIAGNOSIS — Z20822 Contact with and (suspected) exposure to covid-19: Secondary | ICD-10-CM | POA: Diagnosis present

## 2020-03-28 DIAGNOSIS — Z888 Allergy status to other drugs, medicaments and biological substances status: Secondary | ICD-10-CM

## 2020-03-28 DIAGNOSIS — N184 Chronic kidney disease, stage 4 (severe): Secondary | ICD-10-CM | POA: Diagnosis present

## 2020-03-28 DIAGNOSIS — I472 Ventricular tachycardia: Secondary | ICD-10-CM | POA: Diagnosis not present

## 2020-03-28 DIAGNOSIS — E669 Obesity, unspecified: Secondary | ICD-10-CM | POA: Diagnosis present

## 2020-03-28 DIAGNOSIS — I5032 Chronic diastolic (congestive) heart failure: Secondary | ICD-10-CM | POA: Diagnosis present

## 2020-03-28 DIAGNOSIS — E785 Hyperlipidemia, unspecified: Secondary | ICD-10-CM | POA: Diagnosis present

## 2020-03-28 DIAGNOSIS — M79672 Pain in left foot: Secondary | ICD-10-CM

## 2020-03-28 DIAGNOSIS — I130 Hypertensive heart and chronic kidney disease with heart failure and stage 1 through stage 4 chronic kidney disease, or unspecified chronic kidney disease: Principal | ICD-10-CM | POA: Diagnosis present

## 2020-03-28 HISTORY — DX: Dyspnea, unspecified: R06.00

## 2020-03-28 HISTORY — DX: Other symptoms and signs involving the musculoskeletal system: R29.898

## 2020-03-28 HISTORY — DX: Heart failure, unspecified: I50.9

## 2020-03-28 LAB — CBC WITH DIFFERENTIAL/PLATELET
Abs Immature Granulocytes: 0.02 10*3/uL (ref 0.00–0.07)
Basophils Absolute: 0 10*3/uL (ref 0.0–0.1)
Basophils Relative: 0 %
Eosinophils Absolute: 0.1 10*3/uL (ref 0.0–0.5)
Eosinophils Relative: 2 %
HCT: 40.9 % (ref 39.0–52.0)
Hemoglobin: 13.2 g/dL (ref 13.0–17.0)
Immature Granulocytes: 0 %
Lymphocytes Relative: 10 %
Lymphs Abs: 0.5 10*3/uL — ABNORMAL LOW (ref 0.7–4.0)
MCH: 28.5 pg (ref 26.0–34.0)
MCHC: 32.3 g/dL (ref 30.0–36.0)
MCV: 88.3 fL (ref 80.0–100.0)
Monocytes Absolute: 0.3 10*3/uL (ref 0.1–1.0)
Monocytes Relative: 6 %
Neutro Abs: 3.7 10*3/uL (ref 1.7–7.7)
Neutrophils Relative %: 82 %
Platelets: 127 10*3/uL — ABNORMAL LOW (ref 150–400)
RBC: 4.63 MIL/uL (ref 4.22–5.81)
RDW: 16.1 % — ABNORMAL HIGH (ref 11.5–15.5)
WBC: 4.5 10*3/uL (ref 4.0–10.5)
nRBC: 0 % (ref 0.0–0.2)

## 2020-03-28 LAB — BRAIN NATRIURETIC PEPTIDE: B Natriuretic Peptide: 274 pg/mL — ABNORMAL HIGH (ref 0.0–100.0)

## 2020-03-28 LAB — BASIC METABOLIC PANEL
Anion gap: 12 (ref 5–15)
BUN: 11 mg/dL (ref 8–23)
CO2: 22 mmol/L (ref 22–32)
Calcium: 9.1 mg/dL (ref 8.9–10.3)
Chloride: 106 mmol/L (ref 98–111)
Creatinine, Ser: 1.43 mg/dL — ABNORMAL HIGH (ref 0.61–1.24)
GFR calc Af Amer: 56 mL/min — ABNORMAL LOW (ref >60)
GFR calc non Af Amer: 48 mL/min — ABNORMAL LOW (ref >60)
Glucose, Bld: 95 mg/dL (ref 70–99)
Potassium: 3.9 mmol/L (ref 3.5–5.1)
Sodium: 140 mmol/L (ref 135–145)

## 2020-03-28 LAB — TROPONIN I (HIGH SENSITIVITY)
Troponin I (High Sensitivity): 19 ng/L — ABNORMAL HIGH (ref <18)
Troponin I (High Sensitivity): 20 ng/L — ABNORMAL HIGH (ref <18)

## 2020-03-28 LAB — D-DIMER, QUANTITATIVE: D-Dimer, Quant: 0.49 ug/mL-FEU (ref 0.00–0.50)

## 2020-03-28 LAB — SARS CORONAVIRUS 2 (TAT 6-24 HRS): SARS Coronavirus 2: NEGATIVE

## 2020-03-28 MED ORDER — SODIUM CHLORIDE 0.9 % IV SOLN: 250.0000 mL | INTRAVENOUS | Status: DC | PRN

## 2020-03-28 MED ORDER — PANTOPRAZOLE SODIUM 40 MG PO TBEC
40.0000 mg | DELAYED_RELEASE_TABLET | Freq: Every day | ORAL | Status: DC
  Administered 2020-03-28 – 2020-04-04 (×8): 40 mg via ORAL
  Filled 2020-03-28 (×8): qty 1

## 2020-03-28 MED ORDER — POLYETHYLENE GLYCOL 3350 17 G PO PACK: 17.0000 g | PACK | Freq: Every day | ORAL | Status: DC | PRN

## 2020-03-28 MED ORDER — PRIMIDONE 50 MG PO TABS
100.0000 mg | ORAL_TABLET | Freq: Two times a day (BID) | ORAL | Status: DC
  Administered 2020-03-28 – 2020-04-04 (×14): 100 mg via ORAL
  Filled 2020-03-28 (×14): qty 2

## 2020-03-28 MED ORDER — ALLOPURINOL 300 MG PO TABS
250.0000 mg | ORAL_TABLET | Freq: Every day | ORAL | Status: DC
  Administered 2020-03-29 – 2020-04-04 (×7): 250 mg via ORAL
  Filled 2020-03-28 (×8): qty 1

## 2020-03-28 MED ORDER — FUROSEMIDE 10 MG/ML IJ SOLN
40.0000 mg | Freq: Two times a day (BID) | INTRAMUSCULAR | Status: DC
  Administered 2020-03-28 – 2020-03-31 (×6): 40 mg via INTRAVENOUS
  Filled 2020-03-28 (×6): qty 4

## 2020-03-28 MED ORDER — SODIUM CHLORIDE 0.9% FLUSH: 3.0000 mL | INTRAVENOUS | Status: DC | PRN

## 2020-03-28 MED ORDER — CARVEDILOL 25 MG PO TABS
25.0000 mg | ORAL_TABLET | Freq: Two times a day (BID) | ORAL | Status: DC
  Administered 2020-03-28 – 2020-04-04 (×14): 25 mg via ORAL
  Filled 2020-03-28 (×14): qty 1

## 2020-03-28 MED ORDER — ACETAMINOPHEN 650 MG RE SUPP: 650.0000 mg | Freq: Four times a day (QID) | RECTAL | Status: DC | PRN

## 2020-03-28 MED ORDER — BISACODYL 5 MG PO TBEC: 5.0000 mg | DELAYED_RELEASE_TABLET | Freq: Every day | ORAL | Status: DC | PRN

## 2020-03-28 MED ORDER — SODIUM CHLORIDE 0.9 % IV SOLN: INTRAVENOUS | Status: DC

## 2020-03-28 MED ORDER — ZOLPIDEM TARTRATE 5 MG PO TABS
5.0000 mg | ORAL_TABLET | Freq: Every evening | ORAL | Status: DC | PRN
  Administered 2020-04-02: 21:00:00 5 mg via ORAL
  Filled 2020-03-28: qty 1

## 2020-03-28 MED ORDER — FUROSEMIDE 10 MG/ML IJ SOLN
40.0000 mg | Freq: Once | INTRAMUSCULAR | Status: AC
  Administered 2020-03-28: 40 mg via INTRAVENOUS
  Filled 2020-03-28: qty 4

## 2020-03-28 MED ORDER — CLOPIDOGREL BISULFATE 75 MG PO TABS
75.0000 mg | ORAL_TABLET | Freq: Every day | ORAL | Status: DC
  Administered 2020-03-28 – 2020-04-04 (×8): 75 mg via ORAL
  Filled 2020-03-28 (×8): qty 1

## 2020-03-28 MED ORDER — HYDRALAZINE HCL 10 MG PO TABS
10.0000 mg | ORAL_TABLET | Freq: Three times a day (TID) | ORAL | Status: DC
  Administered 2020-03-28 – 2020-04-02 (×17): 10 mg via ORAL
  Filled 2020-03-28 (×19): qty 1

## 2020-03-28 MED ORDER — ONDANSETRON HCL 4 MG PO TABS: 4.0000 mg | ORAL_TABLET | Freq: Four times a day (QID) | ORAL | Status: DC | PRN

## 2020-03-28 MED ORDER — ACETAMINOPHEN 325 MG PO TABS
650.0000 mg | ORAL_TABLET | Freq: Four times a day (QID) | ORAL | Status: DC | PRN
  Filled 2020-03-28: qty 2

## 2020-03-28 MED ORDER — ASPIRIN EC 81 MG PO TBEC
81.0000 mg | DELAYED_RELEASE_TABLET | Freq: Every day | ORAL | Status: DC
  Administered 2020-03-28 – 2020-04-04 (×8): 81 mg via ORAL
  Filled 2020-03-28 (×8): qty 1

## 2020-03-28 MED ORDER — CALCITRIOL 0.25 MCG PO CAPS
0.2500 ug | ORAL_CAPSULE | Freq: Every day | ORAL | Status: DC
  Administered 2020-03-28 – 2020-04-04 (×8): 0.25 ug via ORAL
  Filled 2020-03-28 (×9): qty 1

## 2020-03-28 MED ORDER — DOCUSATE SODIUM 100 MG PO CAPS
100.0000 mg | ORAL_CAPSULE | Freq: Two times a day (BID) | ORAL | Status: DC
  Administered 2020-03-28 – 2020-04-03 (×12): 100 mg via ORAL
  Filled 2020-03-28 (×14): qty 1

## 2020-03-28 MED ORDER — ONDANSETRON HCL 4 MG/2ML IJ SOLN: 4.0000 mg | Freq: Four times a day (QID) | INTRAMUSCULAR | Status: DC | PRN

## 2020-03-28 MED ORDER — HYDRALAZINE HCL 20 MG/ML IJ SOLN: 5.0000 mg | INTRAMUSCULAR | Status: DC | PRN

## 2020-03-28 MED ORDER — SERTRALINE HCL 50 MG PO TABS
25.0000 mg | ORAL_TABLET | Freq: Every day | ORAL | Status: DC
  Administered 2020-03-28 – 2020-04-03 (×7): 25 mg via ORAL
  Filled 2020-03-28 (×8): qty 1

## 2020-03-28 MED ORDER — KETOTIFEN FUMARATE 0.025 % OP SOLN
1.0000 [drp] | Freq: Two times a day (BID) | OPHTHALMIC | Status: DC
  Administered 2020-03-28 – 2020-04-02 (×7): 1 [drp] via OPHTHALMIC
  Filled 2020-03-28 (×2): qty 5

## 2020-03-28 MED ORDER — SODIUM CHLORIDE 0.9% FLUSH
3.0000 mL | Freq: Two times a day (BID) | INTRAVENOUS | Status: DC
  Administered 2020-03-28 – 2020-04-04 (×13): 3 mL via INTRAVENOUS

## 2020-03-28 MED ORDER — APIXABAN 5 MG PO TABS
5.0000 mg | ORAL_TABLET | Freq: Two times a day (BID) | ORAL | Status: DC
  Administered 2020-03-28 – 2020-04-04 (×14): 5 mg via ORAL
  Filled 2020-03-28 (×15): qty 1

## 2020-03-28 MED ORDER — MORPHINE SULFATE (PF) 4 MG/ML IV SOLN
4.0000 mg | Freq: Once | INTRAVENOUS | Status: AC
  Administered 2020-03-28: 15:00:00 4 mg via INTRAVENOUS
  Filled 2020-03-28: qty 1

## 2020-03-28 MED ORDER — ATORVASTATIN CALCIUM 80 MG PO TABS
80.0000 mg | ORAL_TABLET | Freq: Every day | ORAL | Status: DC
  Administered 2020-03-28 – 2020-04-03 (×7): 80 mg via ORAL
  Filled 2020-03-28 (×7): qty 1

## 2020-03-28 MED ORDER — HYDROCODONE-ACETAMINOPHEN 5-325 MG PO TABS
1.0000 | ORAL_TABLET | ORAL | Status: DC | PRN
  Administered 2020-03-28: 17:00:00 2 via ORAL
  Administered 2020-03-29: 1 via ORAL
  Administered 2020-03-30 – 2020-04-02 (×4): 2 via ORAL
  Filled 2020-03-28 (×2): qty 2
  Filled 2020-03-28: qty 1
  Filled 2020-03-28 (×3): qty 2

## 2020-03-28 MED ORDER — MORPHINE SULFATE (PF) 2 MG/ML IV SOLN: 2.0000 mg | INTRAVENOUS | Status: DC | PRN

## 2020-03-28 NOTE — Plan of Care (Signed)
  Problem: Education: Goal: Knowledge of General Education information will improve Description: Including pain rating scale, medication(s)/side effects and non-pharmacologic comfort measures Outcome: Progressing  Patient verbalizes relationship between swelling in legs and shortness of breath.  Problem: Activity: Goal: Risk for activity intolerance will decrease Outcome: Progressing  Patient states no dyspnea at rest.

## 2020-03-28 NOTE — ED Provider Notes (Signed)
[facility] EMERGENCY DEPARTMENT Provider Note   CSN: [id number] Arrival date & time: 03/28/20  1213     History Chief Complaint  Patient presents with  . Shortness of Breath    Alexander Austin is a 74 y.o. male.  74 year old male with history of multiple medical problems including ischemic cardiomyopathy, A. fib, CKD who presents with increased shortness of breath x2 days.  Does have a history of ischemic cardiomyopathy and has had some intermittent chest discomfort as well but seems to be worse with coughing.  Cough has been productive of white sputum.  Denies any fever or chills.  Some increased lower extremity edema.  Some pain to his right knee which is atraumatic and not associate with fever or chills.  No vomiting or diarrhea.  States compliance with his home medications.  Has had similar symptoms in the past associated with CHF.  Called EMS and was transported here and placed on 2 L of oxygen for his comfort        Past Medical History:  Diagnosis Date  . A-fib ([place])   . Anxiety   . Benign prostate hyperplasia   . Cancer ([place])    Kidney  . Chronic kidney disease   . CVA (cerebral infarction)   . Depression   . GERD (gastroesophageal reflux disease)   . Gout   . Hematospermia 01/13/2012  . History of myocardial infarction 1993  . Hyperlipidemia   . Hypertension   . Sleep apnea   . Stroke ([place])   . Vitamin B 12 deficiency     Patient Active Problem List   Diagnosis Date Noted  . Acute gout 03/07/2020  . CKD (chronic kidney disease) stage 4, GFR 15-29 ml/min (HCC) 03/07/2020  . Obesity, Class III, BMI 40-49.9 (morbid obesity) ([place]) 03/07/2020  . Tremor 04/16/2015  . BPH (benign prostatic hyperplasia) 04/16/2015  . Chronic anticoagulation 04/16/2015  . Edema 04/16/2015  . Urinary retention 03/06/2015  . 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 ([place]) 03/06/2015  . NSTEMI (non-ST  elevated myocardial infarction) ([place]) 03/06/2015  . PAF (paroxysmal atrial fibrillation) ([place]) 03/06/2015  . History of renal cell cancer 03/06/2015  . Vitamin B12 deficiency 03/06/2015  . Major depressive disorder, recurrent, in remission ([place]) 03/06/2015  . History of stroke 03/06/2015  . GERD (gastroesophageal reflux disease) 03/06/2015  . Essential hypertension, benign 03/06/2015  . Chronic gout 03/06/2015    Past Surgical History:  Procedure Laterality Date  . ANGIOPLASTY     X 8   . BACK SURGERY    . 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  . SAPHENOUS VEIN GRAFT RESECTION  02/05/15       Family History  Problem Relation Age of Onset  . Hypertension Mother   . Heart disease Mother   . Stroke Mother   . Hypertension Father     Social History   Tobacco Use  . Smoking status: Former Smoker    Quit date: 07/24/1982    Years since quitting: 37.7  . Smokeless tobacco: Never Used  Substance Use Topics  . Alcohol use: No    Alcohol/week: 0.0 standard drinks  . Drug use: No    Home Medications Prior to Admission medications   Medication Sig Start Date End Date Taking? Authorizing Provider  allopurinol (ZYLOPRIM) 100 MG tablet Take 250 mg by mouth daily.     [provider]  amLODipine (NORVASC) 10 MG tablet Take  10 mg by mouth daily.     [provider]  apixaban (ELIQUIS) 5 MG TABS tablet Take by mouth 2 (two) times daily.     [provider]  atorvastatin (LIPITOR) 80 MG tablet Take by mouth daily at 6 PM.     [provider]  calcitRIOL (ROCALTROL) 0.25 MCG capsule Take 0.25 mcg by mouth daily.     [provider]  carvedilol (COREG) 25 MG tablet Take 25 mg by mouth in the morning and at bedtime.  05/05/15   [provider]  clopidogrel (PLAVIX) 75 MG tablet Take 75 mg by mouth daily.     [provider]  Docusate Sodium (DSS) 100 MG CAPS Take 200 mg by mouth daily.     [provider]  ferrous  sulfate 325 (65 FE) MG tablet Take 325 mg by mouth every Monday, Wednesday, and Friday.  10/18/18   [provider]  hydrALAZINE (APRESOLINE) 10 MG tablet Take 10 mg by mouth 3 (three) times daily.     [provider]  ketotifen (ZADITOR) 0.025 % ophthalmic solution Place 1 drop into both eyes 2 times daily.    [provider]  nitroGLYCERIN (NITROSTAT) 0.4 MG SL tablet Place 0.4 mg under the tongue every 5 (five) minutes as needed for chest pain.     [provider]  pantoprazole (PROTONIX) 40 MG tablet Take 40 mg by mouth daily. Take before morning meds to protect stomach    [provider]  predniSONE (DELTASONE) 5 MG tablet Take 2 tablets (10 mg total) by mouth 2 (two) times daily with a meal. Take 10 mg BID x 3 days. Then take 10 mg once a day x 3 days. Then take  5 mg once a day x 3 days then stop 03/10/20   Kayleen Memos, DO  primidone (MYSOLINE) 50 MG tablet Take 100 mg by mouth in the morning and at bedtime.     [provider]  sertraline (ZOLOFT) 50 MG tablet Take 25 mg by mouth at bedtime.     [provider]  Vitamin D, Ergocalciferol, (DRISDOL) 1.25 MG (50000 UNIT) CAPS capsule Take 50,000 Units by mouth every 7 (seven) days.    [provider]    Allergies    Isosorbide dinitrate, Isosorbide nitrate, and Shellfish-derived products  Review of Systems   Review of Systems  All other systems reviewed and are negative.   Physical Exam Updated Vital Signs BP (!) 194/88 (BP Location: Left Arm)   Pulse 77   Temp 98.1 F (36.7 C) (Oral)   Resp (!) 23   SpO2 99%   Physical Exam Vitals and nursing note reviewed.  Constitutional:      General: He is not in acute distress.    Appearance: Normal appearance. He is well-developed. He is not toxic-appearing.  HENT:     Head: Normocephalic and atraumatic.  Eyes:     General: Lids are normal.     Conjunctiva/sclera: Conjunctivae normal.     Pupils: Pupils are  equal, round, and reactive to light.  Neck:     Thyroid: No thyroid mass.     Trachea: No tracheal deviation.  Cardiovascular:     Rate and Rhythm: Normal rate and regular rhythm.     Heart sounds: Normal heart sounds. No murmur. No gallop.   Pulmonary:     Effort: Pulmonary effort is normal. No respiratory distress.     Breath sounds: No stridor. Examination of  the right-lower field reveals decreased breath sounds and rales. Examination of the left-lower field reveals decreased breath sounds and rales. Decreased breath sounds and rales present. No wheezing or rhonchi.  Abdominal:     General: Bowel sounds are normal. There is no distension.     Palpations: Abdomen is soft.     Tenderness: There is no abdominal tenderness. There is no rebound.  Musculoskeletal:        General: No tenderness.     Cervical back: Normal range of motion and neck supple.     Left knee: No deformity, effusion or erythema. Decreased range of motion.     Comments: 3+ bilateral lower extremity pitting edema  Skin:    General: Skin is warm and dry.     Findings: No abrasion or rash.  Neurological:     Mental Status: He is alert and oriented to person, place, and time.     GCS: GCS eye subscore is 4. GCS verbal subscore is 5. GCS motor subscore is 6.     Cranial Nerves: No cranial nerve deficit.     Sensory: No sensory deficit.  Psychiatric:        Speech: Speech normal.        Behavior: Behavior normal.     ED Results / Procedures / Treatments   Labs (all labs ordered are listed, but only abnormal results are displayed) Labs Reviewed  CBC WITH DIFFERENTIAL/PLATELET  BRAIN NATRIURETIC PEPTIDE  BASIC METABOLIC PANEL    EKG EKG Interpretation  Date/Time:  Friday March 28 2020 12:23:03 EDT Ventricular Rate:  80 PR Interval:    QRS Duration: 141 QT Interval:  452 QTC Calculation: 522 R Axis:   2 Text Interpretation: Sinus rhythm Short PR interval Nonspecific intraventricular conduction delay  Probable inferior infarct, old Confirmed by Lacretia Leigh ([id number]) on 03/28/2020 12:31:53 PM   Radiology No results found.  Procedures Procedures (including critical care time)  Medications Ordered in ED Medications  0.9 %  sodium chloride infusion (has no administration in time range)    ED Course  I have reviewed the triage vital signs and the nursing notes.  Pertinent labs & imaging results that were available during my care of the patient were reviewed by me and considered in my medical decision making (see chart for details).    MDM Rules/Calculators/A&P                      Patient to be admitted for treatment of ongoing dyspnea Final Clinical Impression(s) / ED Diagnoses Final diagnoses:  None    Rx / DC Orders ED Discharge Orders    None       Lacretia Leigh, MD 04/01/20 [phone]

## 2020-03-28 NOTE — ED Triage Notes (Signed)
Pt arrives by EMS with c/o of SOB since this morining. Pt reports some chest pain earlier but has since resolved. Pt reports pace maker by [employer].   200/88 HR 75 RR 20 96% 2 liters for comfort

## 2020-03-28 NOTE — H&P (Addendum)
History and Physical    Alexander Austin DGU:[id number] DOB: [birth year] DOA: 03/28/2020  PCP: [facility] Consultants:  Ola Spurr - cardiology; [place] - nephrology Patient coming from:  Home - lives alone; NOK: Alexander Austin and Alexander Austin, landlords, [phone]  Chief Complaint: SOB  HPI: Alexander Austin is a 74 y.o. male with medical history significant of afib, on Eliquis; renal cell cancer, 9/12 s/p right partial nephrectomy; stage 4 CKD; CAD s/p CABG;  ischemic cardiomyopathy with an AICD; HTN; OSA; and CVA presenting with SOB.  He was last admitted from 3/20-22 for acute gout.  He reports that there was something wrong with his left knee, he couldn't put pressure on it.  He also couldn't breathe.  So he called 911.  He is not on O2 at home but he does have chronic SOB.  SOB started a couple of days ago and has been worsening.  +cough, slightly worse than baseline.  Has L foot gout with chronic swelling.  +substernal CP this AM, resolved spontaneously.  No orthopnea.  +PND.    ED Course: Likely CHF - increasing SOB for a few days.  Not regularly on Lasix.  Some increased LE edema.  BNP mildly elevated.  Normal D-dimer.  Review of Systems: As per HPI; otherwise review of systems reviewed and negative.   Ambulatory Status:  Ambulates with a cane  COVID Vaccine Status:   Complete  Past Medical History:  Diagnosis Date  . A-fib ([place])   . Anxiety   . Benign prostate hyperplasia   . Cancer ([place])    Kidney  . Chronic kidney disease   . Congestive heart failure (CHF) ([place])   . CVA (cerebral infarction)   . Depression   . GERD (gastroesophageal reflux disease)   . Gout   . Hematospermia 01/13/2012  . History of myocardial infarction 1993  . Hyperlipidemia   . Hypertension   . Sleep apnea   . Stroke ([place])   . Vitamin B 12 deficiency     Past Surgical History:  Procedure Laterality Date  . ANGIOPLASTY     X 8   . BACK SURGERY    . 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  . SAPHENOUS  VEIN GRAFT RESECTION  02/05/15    Social History   Socioeconomic History  . Marital status: Married    Spouse name: Not on file  . Number of children: Not on file  . Years of education: Not on file  . Highest education level: Not on file  Occupational History  . Occupation: retired  Tobacco Use  . Smoking status: Former Smoker    Quit date: 07/24/1982    Years since quitting: 37.7  . Smokeless tobacco: Never Used  Substance and Sexual Activity  . Alcohol use: No    Alcohol/week: 0.0 standard drinks  . Drug use: No  . Sexual activity: Never  Other Topics Concern  . Not on file  Social History Narrative  . Not on file   Social Determinants of Health   Financial Resource Strain:   . Difficulty of Paying Living Expenses:   Food Insecurity:   . Worried About Charity fundraiser in the Last Year:   . Arboriculturist in the Last Year:   Transportation Needs:   . Film/video editor (Medical):   [name] Lack of Transportation (Non-Medical):   Physical Activity:   . Days of Exercise per Week:   . Minutes of Exercise per Session:   Stress:   .  Feeling of Stress :   Social Connections:   . Frequency of Communication with Friends and Family:   . Frequency of Social Gatherings with Friends and Family:   . Attends Religious Services:   . Active Member of Clubs or Organizations:   . Attends Archivist Meetings:   [name] Marital Status:   Intimate Partner Violence:   . Fear of Current or Ex-Partner:   . Emotionally Abused:   [name] Physically Abused:   . Sexually Abused:     Allergies  Allergen Reactions  . Isosorbide Dinitrate Other (See Comments) and Anaphylaxis    Cardiovascular Arrest Cardiovascular Arrest Can take Sublingual Nitro  . Isosorbide Nitrate Anaphylaxis    Other reaction(s): Cardiovascular Arrest (ALLERGY/intolerance) Cardiovascular Arrest Can take Sublingual Nitro. Takes sublingual nitroglycerin at home with no problems Cardiovascular Arrest Can take  Sublingual Nitro.   . 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    Family History  Problem Relation Age of Onset  . Hypertension Mother   . Heart disease Mother   . Stroke Mother   . Hypertension Father     Prior to Admission medications   Medication Sig Start Date End Date Taking? Authorizing Provider  allopurinol (ZYLOPRIM) 100 MG tablet Take 250 mg by mouth daily.    Yes [provider]  amLODipine (NORVASC) 10 MG tablet Take 10 mg by mouth daily.    Yes [provider]  apixaban (ELIQUIS) 5 MG TABS tablet Take by mouth 2 (two) times daily.    Yes [provider]  atorvastatin (LIPITOR) 80 MG tablet Take by mouth daily at 6 PM.    Yes [provider]  calcitRIOL (ROCALTROL) 0.25 MCG capsule Take 0.25 mcg by mouth daily.    Yes [provider]  carvedilol (COREG) 25 MG tablet Take 25 mg by mouth in the morning and at bedtime.  05/05/15  Yes [provider]  clopidogrel (PLAVIX) 75 MG tablet Take 75 mg by mouth daily.    Yes [provider]  Docusate Sodium (DSS) 100 MG CAPS Take 200 mg by mouth daily as needed (constipation).    Yes [provider]  ferrous sulfate 325 (65 FE) MG tablet Take 325 mg by mouth every Monday, Wednesday, and Friday.  10/18/18  Yes [provider]  hydrALAZINE (APRESOLINE) 10 MG tablet Take 10 mg by mouth 3 (three) times daily.    Yes [provider]  ketotifen (ZADITOR) 0.025 % ophthalmic solution Place 1 drop into both eyes 2 (two) times daily.    Yes [provider]  nitroGLYCERIN (NITROSTAT) 0.4 MG SL tablet Place 0.4 mg under the tongue every 5 (five) minutes as needed for chest pain.    Yes [provider]  pantoprazole (PROTONIX) 40 MG tablet Take 40 mg by mouth daily. Take before morning meds to protect stomach   Yes [provider]  primidone (MYSOLINE) 50 MG tablet Take 100 mg by  mouth in the morning and at bedtime.    Yes [provider]  sertraline (ZOLOFT) 50 MG tablet Take 25 mg by mouth at bedtime.    Yes [provider]  Vitamin D, Ergocalciferol, (DRISDOL) 1.25 MG (50000 UNIT) CAPS capsule Take 50,000 Units by mouth every 7 (seven) days. Sunday   Yes [provider]  predniSONE (DELTASONE) 5 MG tablet Take 2 tablets (10 mg total) by mouth 2 (two) times daily with a meal. Take 10 mg BID  x 3 days. Then take 10 mg once a day x 3 days. Then take  5 mg once a day x 3 days then stop Patient not taking: Reported on 03/28/2020 03/10/20   Kayleen Memos, DO    Physical Exam: Vitals:   03/28/20 1220 03/28/20 1223 03/28/20 1443 03/28/20 1553  BP:  (!) 194/88 (!) 196/86 (!) 182/60  Pulse:  77 72 68  Resp:  (!) 23 (!) 29 (!) 28  Temp:  98.1 F (36.7 C)    TempSrc:  Oral    SpO2: 96% 99% 98% 93%     . General:  Appears calm and comfortable and is NAD, on [place] O2 . Eyes:  PERRL, EOMI, normal lids, iris . ENT:  grossly normal hearing, lips & tongue, mmm; edentulous . Neck:  no LAD, masses or thyromegaly . Cardiovascular:  RRR, no m/r/g. 2+ LE edema.  [name] Respiratory:   CTA bilaterally with no wheezes/rales/rhonchi.  Mildly increased respiratory effort. . Abdomen:  soft, NT, ND, NABS . Back:   normal alignment, no CVAT . Skin:  no rash or induration seen on limited exam . Musculoskeletal:  grossly normal tone BUE/BLE, good ROM, no bony abnormality . Psychiatric:  grossly normal mood and affect, speech fluent and appropriate, AOx3 . Neurologic:  CN 2-12 grossly intact, moves all extremities in coordinated fashion    Radiological Exams on Admission: DG Chest Port 1 View  Result Date: 03/28/2020 CLINICAL DATA:  Shortness of breath since this morning. EXAM: PORTABLE CHEST 1 VIEW COMPARISON:  Radiographs 03/07/2020 and 05/10/2019. CT 08/13/2016. FINDINGS: 1243 hours. Mild patient rotation to the right. The heart size and mediastinal contours are  stable status post median sternotomy and CABG. Left subclavian AICD leads are unchanged. The lungs are clear. There is no pleural effusion or pneumothorax. No acute osseous findings are evident. Telemetry leads overlie the chest. IMPRESSION: Stable postoperative chest. No active cardiopulmonary process. Electronically Signed   By: Richardean Sale M.D.   On: 03/28/2020 12:55    EKG: Independently reviewed.  NSR with rate 80; IVCD; nonspecific ST changes with no evidence of acute ischemia   Labs on Admission: I have personally reviewed the available labs and imaging studies at the time of the admission.  Pertinent labs:   BUN 11/Creatinine 1.43/GFR 48 - improved from prior BNP 274.0; 106.1 on 3/19 Platelets 127 D-dimer 0.49 COVID pending   Assessment/Plan Principal Problem:   Acute on chronic systolic CHF (congestive heart failure) (HCC) Active Problems:   PAF (paroxysmal atrial fibrillation) (HCC)   Essential hypertension, benign   Chronic gout   CKD (chronic kidney disease) stage 4, GFR 15-29 ml/min (HCC)   Obesity (BMI 30.0-34.9)   Acute on chronic CHF exacerbation with h/o CAD -Patient with known h/o chronic systolic CHF presenting with worsening SOB and edema -Last echo appears to have been in 2019 with EF 40-45%, improved from prior -CXR without obvious pulmonary edema -Mildly elevated BNP, doubled from a month ago -He appears to have acute decompensated CHF -This is likely related to his recent discontinuation of Lasix (due to acute gout admission) -Will admit to telemetry for reintroduction of Lasix and ongoing monitoring -Will request echocardiogram -Continue Plavix -Hold ACE due to renal dysfunction -Continue Coreg -CHF order set utilized; may need CHF team consult but will hold until Echo results are available -Was given Lasix 40 mg x 1 in ER and will repeat with 40 mg IV BID -Continue [place] O2 for now -Normal kidney function at  this time, will follow -Repeat EKG in  AM -He did have substernal, nonexertional CP that resolved spontaneously earlier today; will trend troponin  HTN -Hold Norvasc in the setting of edema -Continue Coreg, hydralazine -Will also add prn hydralazine  HLD -Continue Lipitor -Check lipids  Obesity -BMI 30.8 -Weight loss should be encouraged  Chronic gout with recent flare -Recent Uric acid 9.9 -Off Lasix due to hyperuricemia -Continue Allopurinol -Will need close monitoring since restarting Lasix  CKD 3B -Baseline creatinine is 1.5-1.7 -Current creatinine is better than usual baseline -Continue to avoid nephrotoxins -Follow BMP while on Lasix  Paroxysmal A. fib -Rate controlled with AICD in place -Continue Coreg -Continue Eliquis  History of CVA -Continue Plavix and Lipitor -Continue primidone for tremor  Chronic depression/anxiety -Continue Zoloft   Note: This patient has been tested and is pending for the novel coronavirus COVID-19.  DVT prophylaxis: Eliquis Code Status:  DNI - confirmed with patient Family Communication: None present; I attempted to reach his landlord at the time of admission but had to leave a message Disposition Plan:  Home once clinically improved Consults called: TOC team/PT Admission status: Admit - It is my clinical opinion that admission to INPATIENT is reasonable and necessary because this patient will require at least 2 midnights in the hospital to treat this condition based on the medical complexity of the problems presented.  Given the aforementioned information, the predictability of an adverse outcome is felt to be significant.    Karmen Bongo MD [facility]   How to contact the [facility] Attending or Consulting provider [place] or covering provider during after hours [place], for this patient?  1. Check the care team in [facility] and look for a) attending/consulting TR[facility] provider listed and b) the [facility] team listed 2. Log into [email or web address] and use [place]'s  universal password to access. If you do not have the password, please contact the hospital operator. 3. Locate the [facility] provider you are looking for under [facility] and page to a number that you can be directly reached. 4. If you still have difficulty reaching the provider, please page the [facility] (Director on Call) for the Hospitalists listed on amion for assistance.   03/28/2020, 4:24 PM

## 2020-03-29 ENCOUNTER — Inpatient Hospital Stay (HOSPITAL_COMMUNITY): Payer: Medicare Other

## 2020-03-29 DIAGNOSIS — I5023 Acute on chronic systolic (congestive) heart failure: Secondary | ICD-10-CM

## 2020-03-29 LAB — CBC WITH DIFFERENTIAL/PLATELET
Abs Immature Granulocytes: 0.01 10*3/uL (ref 0.00–0.07)
Basophils Absolute: 0 10*3/uL (ref 0.0–0.1)
Basophils Relative: 0 %
Eosinophils Absolute: 0.1 10*3/uL (ref 0.0–0.5)
Eosinophils Relative: 2 %
HCT: 35.5 % — ABNORMAL LOW (ref 39.0–52.0)
Hemoglobin: 11.5 g/dL — ABNORMAL LOW (ref 13.0–17.0)
Immature Granulocytes: 0 %
Lymphocytes Relative: 12 %
Lymphs Abs: 0.5 10*3/uL — ABNORMAL LOW (ref 0.7–4.0)
MCH: 28.5 pg (ref 26.0–34.0)
MCHC: 32.4 g/dL (ref 30.0–36.0)
MCV: 87.9 fL (ref 80.0–100.0)
Monocytes Absolute: 0.2 10*3/uL (ref 0.1–1.0)
Monocytes Relative: 5 %
Neutro Abs: 3.4 10*3/uL (ref 1.7–7.7)
Neutrophils Relative %: 81 %
Platelets: 111 10*3/uL — ABNORMAL LOW (ref 150–400)
RBC: 4.04 MIL/uL — ABNORMAL LOW (ref 4.22–5.81)
RDW: 16.3 % — ABNORMAL HIGH (ref 11.5–15.5)
WBC: 4.2 10*3/uL (ref 4.0–10.5)
nRBC: 0 % (ref 0.0–0.2)

## 2020-03-29 LAB — LIPID PANEL
Cholesterol: 119 mg/dL (ref 0–200)
HDL: 32 mg/dL — ABNORMAL LOW (ref >40)
LDL Cholesterol: 68 mg/dL (ref 0–99)
Total CHOL/HDL Ratio: 3.7 RATIO
Triglycerides: 94 mg/dL (ref <150)
VLDL: 19 mg/dL (ref 0–40)

## 2020-03-29 LAB — BASIC METABOLIC PANEL
Anion gap: 11 (ref 5–15)
BUN: 16 mg/dL (ref 8–23)
CO2: 25 mmol/L (ref 22–32)
Calcium: 8.6 mg/dL — ABNORMAL LOW (ref 8.9–10.3)
Chloride: 104 mmol/L (ref 98–111)
Creatinine, Ser: 1.69 mg/dL — ABNORMAL HIGH (ref 0.61–1.24)
GFR calc Af Amer: 45 mL/min — ABNORMAL LOW (ref >60)
GFR calc non Af Amer: 39 mL/min — ABNORMAL LOW (ref >60)
Glucose, Bld: 98 mg/dL (ref 70–99)
Potassium: 3.4 mmol/L — ABNORMAL LOW (ref 3.5–5.1)
Sodium: 140 mmol/L (ref 135–145)

## 2020-03-29 LAB — ECHOCARDIOGRAM COMPLETE
Height: 74 in
Weight: 3912 oz

## 2020-03-29 MED ORDER — POTASSIUM CHLORIDE CRYS ER 20 MEQ PO TBCR
40.0000 meq | EXTENDED_RELEASE_TABLET | Freq: Once | ORAL | Status: AC
  Administered 2020-03-29: 15:00:00 40 meq via ORAL
  Filled 2020-03-29: qty 2

## 2020-03-29 NOTE — Progress Notes (Signed)
PT Cancellation Note  Patient Details Name: Alexander Austin MRN: [id number] DOB: 05/13/1946   Cancelled Treatment:    Reason Eval/Treat Not Completed: Patient declined, no reason specified.  I went to see pt - introduced myself as PT and pt laughed.  He said he doesn't need PT. He lives alone - has 3 canes and 2 RW. He has landlord to check on him /help if needed. He said no history of falls.  He said once he gets fluid off then he will be back to normal. He said left knee not hurting.  He asked me not to check back on him.  Will check MD note but otherwise sign off per pt request.   Loyal Buba 03/29/2020, 5:36 PM

## 2020-03-29 NOTE — Progress Notes (Signed)
PROGRESS NOTE  Alexander Austin  OEV:[id number] DOB: 10-03-1946  DOA: 03/28/2020 PCP: Patient, No Pcp Per   Brief Narrative:  Alexander Austin is a 74 y.o. male with medical history significant of afib, on Eliquis; renal cell cancer, 9/12 s/p right partial nephrectomy; stage 4 CKD; CAD s/p CABG;  ischemic cardiomyopathy with an AICD; HTN; OSA; and CVA presenting with progressively worsening SOB associated with cough, PND  and substernal chest pain. ED evaluation revealed normal D-dimer with mild elevated B-type natriuretic peptide acute on chronic CHF   Assessment & Plan:   Principal Problem:   Acute on chronic systolic CHF (congestive heart failure) (HCC) Active Problems:   PAF (paroxysmal atrial fibrillation) (HCC)   Essential hypertension, benign   Chronic gout   CKD (chronic kidney disease) stage 4, GFR 15-29 ml/min (HCC)   Obesity (BMI 30.0-34.9)   Acute on chronic CHF Recent discontinuation of diuretic due to acute gout No acute airspace disease BNP, mildly elevated, doubled from a month ago 2D echo 03/29/2020-EF 82-99%, grade 2 diastolic dysfunction ACE inhibitor held on admission due to acute renal dysfunction-monitor and resume when able Continue Coreg Continue diuretics with monitor electrolytes and renal function Trend troponin Continue O2 supplementation  Chronic kidney disease type III Baseline creatinine 1.5-1.7 Avoid nephrotoxins and monitor creatinine for renal function  Paroxysmal A. fib -Rate controlled with AICD in place -Continue Coreg -Continue Eliquis  History of CVA -Continue Plavix and Lipitor -Continue primidone for tremor  Chronic depression/anxiety -Continue Zoloft   HTN -Held Norvasc in the setting of edema -Continue Coreg, hydralazine -Continue prn hydralazine    Obesity -BMI 30.8 -Weight loss should be encouraged  Chronic gout with recent flare -Recent Uric acid 9.9 -Off Lasix due to hyperuricemia -Continue Allopurinol -Will  need close monitoring since restarting Lasix   DVT prophylaxis: Eliquis Code Status: DNI Family Communication:  Disposition Plan: Home   Consultants:     Procedures:     Antimicrobials:     Subjective: Denies chest pain, breathing better.  Earlier episode of 5 beats of nonsustained V. tach noted  Objective:  Vitals:   03/29/20 0505 03/29/20 0739 03/29/20 0832 03/29/20 1130  BP: (!) 151/67 (!) 154/64 (!) 137/54 140/71  Pulse: 67 74 61 62  Resp: 18 20  20   Temp: 98.6 F (37 C) 98.8 F (37.1 C)  99.3 F (37.4 C)  TempSrc: Oral Oral  Oral  SpO2: 92% 93%  98%  Weight: 110.9 kg     Height:        Intake/Output Summary (Last 24 hours) at 03/29/2020 1421 Last data filed at 03/29/2020 1247 Gross per 24 hour  Intake 510 ml  Output 1300 ml  Net -790 ml   Filed Weights   03/28/20 1739 03/29/20 0505  Weight: 112.1 kg 110.9 kg    Examination:  General exam: Comfortable, no acute distress Respiratory system: Mildly diminished at the bases. Respiratory effort normal. Cardiovascular system: S1 & S2 heard, RRR. No JVD, murmurs, rubs, gallops or clicks. 2+ bipedal  edema. Gastrointestinal system: Abdomen is nondistended, soft and nontender. No organomegaly or masses felt. Normal bowel sounds heard. Central nervous system: Alert and oriented. No focal neurological deficits. Extremities: Symmetric 5 x 5 power. Skin: No rashes, lesions or ulcers Psychiatry: Judgement and insight appear normal. Mood & affect appropriate.     Data Reviewed: I have personally reviewed following labs and imaging studies  CBC: Recent Labs  Lab 03/28/20 1235 03/29/20 0413  WBC 4.5 4.2  NEUTROABS 3.7 3.4  HGB 13.2 11.5*  HCT 40.9 35.5*  MCV 88.3 87.9  PLT 127* 974*   Basic Metabolic Panel: Recent Labs  Lab 03/28/20 1235 03/29/20 0413  NA 140 140  K 3.9 3.4*  CL 106 104  CO2 22 25  GLUCOSE 95 98  BUN 11 16  CREATININE 1.43* 1.69*  CALCIUM 9.1 8.6*   GFR: Estimated  Creatinine Clearance: 50.8 mL/min (A) (by C-G formula based on SCr of 1.69 mg/dL (H)). Liver Function Tests: No results for input(s): AST, ALT, ALKPHOS, BILITOT, PROT, ALBUMIN in the last 168 hours. No results for input(s): LIPASE, AMYLASE in the last 168 hours. No results for input(s): AMMONIA in the last 168 hours. Coagulation Profile: No results for input(s): INR, PROTIME in the last 168 hours. Cardiac Enzymes: No results for input(s): CKTOTAL, CKMB, CKMBINDEX, TROPONINI in the last 168 hours. BNP (last 3 results) No results for input(s): PROBNP in the last 8760 hours. HbA1C: No results for input(s): HGBA1C in the last 72 hours. CBG: No results for input(s): GLUCAP in the last 168 hours. Lipid Profile: Recent Labs    03/29/20 0413  CHOL 119  HDL 32*  LDLCALC 68  TRIG 94  CHOLHDL 3.7   Thyroid Function Tests: No results for input(s): TSH, T4TOTAL, FREET4, T3FREE, THYROIDAB in the last 72 hours. Anemia Panel: No results for input(s): VITAMINB12, FOLATE, FERRITIN, TIBC, IRON, RETICCTPCT in the last 72 hours.  Sepsis Labs: Recent Labs  Lab 03/28/20 1235 03/29/20 0413  WBC 4.5 4.2    Recent Results (from the past 240 hour(s))  SARS CORONAVIRUS 2 (TAT 6-24 HRS) Nasopharyngeal Nasopharyngeal Swab     Status: None   Collection Time: 03/28/20 12:34 PM   Specimen: Nasopharyngeal Swab  Result Value Ref Range Status   SARS Coronavirus 2 NEGATIVE NEGATIVE Final    Comment: (NOTE) SARS-CoV-2 target nucleic acids are NOT DETECTED. The SARS-CoV-2 RNA is generally detectable in upper and lower respiratory specimens during the acute phase of infection. Negative results do not preclude SARS-CoV-2 infection, do not rule out co-infections with other pathogens, and should not be used as the sole basis for treatment or other patient management decisions. Negative results must be combined with clinical observations, patient history, and epidemiological information. The expected result  is Negative. Fact Sheet for Patients: SugarRoll.be Fact Sheet for Healthcare Providers: https://www.woods-mathews.com/ This test is not yet approved or cleared by the [place] FDA and  has been authorized for detection and/or diagnosis of SARS-CoV-2 by FDA under an Emergency Use Authorization (EUA). This EUA will remain  in effect (meaning this test can be used) for the duration of the COVID-19 declaration under Section 56 4(b)(1) of the Act, 21 U.S.C. section 360bbb-3(b)(1), unless the authorization is terminated or revoked sooner. Performed at [facility], [place] [address], [place], [place] [address]          Radiology Studies: DG Chest Port 1 View  Result Date: 03/28/2020 CLINICAL DATA:  Shortness of breath since this morning. EXAM: PORTABLE CHEST 1 VIEW COMPARISON:  Radiographs 03/07/2020 and 05/10/2019. CT 08/13/2016. FINDINGS: 1243 hours. Mild patient rotation to the right. The heart size and mediastinal contours are stable status post median sternotomy and CABG. Left subclavian AICD leads are unchanged. The lungs are clear. There is no pleural effusion or pneumothorax. No acute osseous findings are evident. Telemetry leads overlie the chest. IMPRESSION: Stable postoperative chest. No active cardiopulmonary process. Electronically Signed   By: Caryl Comes.D.  On: 03/28/2020 12:55   ECHOCARDIOGRAM COMPLETE  Result Date: 03/29/2020    ECHOCARDIOGRAM REPORT   Patient Name:   Alexander Austin Date of Exam: 03/29/2020 Medical Rec #:  [id number]    Height:       74.0 in Accession #:    [id number]   Weight:       244.5 lb Date of Birth:  [birth year]    BSA:          2.367 m Patient Age:    31 years     BP:           154/64 mmHg Patient Gender: M            HR:           65 bpm. Exam Location:  Inpatient Procedure: 2D Echo, Cardiac Doppler and Color Doppler Indications:    I50.23 Acute on chronic systolic (congestive) heart failure  History:         Patient has no prior history of Echocardiogram examinations.                 Previous Myocardial Infarction, Pacemaker, Stroke,                 Arrythmias:Atrial Fibrillation, Signs/Symptoms:Dyspnea; Risk                 Factors:Hypertension, Dyslipidemia, Sleep Apnea and GERD. CKD.  Sonographer:    Jonelle Sidle Dance Referring Phys: [place]  1. Left ventricular ejection fraction, by estimation, is 50 to 55%. The left ventricle has low normal function. Left ventricular endocardial border not optimally defined to evaluate regional wall motion. There is mild concentric left ventricular hypertrophy. Left ventricular diastolic parameters are consistent with Grade II diastolic dysfunction (pseudonormalization). Elevated left ventricular end-diastolic pressure.  2. Right ventricular systolic function is normal. The right ventricular size is normal. There is normal pulmonary artery systolic pressure.  3. Left atrial size was moderately dilated.  4. The mitral valve is normal in structure. Trivial mitral valve regurgitation. No evidence of mitral stenosis.  5. The aortic valve is normal in structure. Aortic valve regurgitation is mild. No aortic stenosis is present.  6. The inferior vena cava is normal in size with greater than 50% respiratory variability, suggesting right atrial pressure of 3 mmHg. FINDINGS  Left Ventricle: Left ventricular ejection fraction, by estimation, is 50 to 55%. The left ventricle has low normal function. Left ventricular endocardial border not optimally defined to evaluate regional wall motion. The left ventricular internal cavity  size was normal in size. There is mild concentric left ventricular hypertrophy. Left ventricular diastolic parameters are consistent with Grade II diastolic dysfunction (pseudonormalization). Elevated left ventricular end-diastolic pressure. Right Ventricle: The right ventricular size is normal. No increase in right ventricular wall thickness.  Right ventricular systolic function is normal. There is normal pulmonary artery systolic pressure. The tricuspid regurgitant velocity is 2.06 m/s, and  with an assumed right atrial pressure of 3 mmHg, the estimated right ventricular systolic pressure is 25.6 mmHg. Left Atrium: Left atrial size was moderately dilated. Right Atrium: Right atrial size was normal in size. Pericardium: There is no evidence of pericardial effusion. Mitral Valve: The mitral valve is normal in structure. Normal mobility of the mitral valve leaflets. Trivial mitral valve regurgitation. No evidence of mitral valve stenosis. Tricuspid Valve: The tricuspid valve is normal in structure. Tricuspid valve regurgitation is not demonstrated. No evidence of tricuspid stenosis. Aortic Valve: The aortic valve is normal in  structure. Aortic valve regurgitation is mild. Aortic regurgitation PHT measures 546 msec. No aortic stenosis is present. Pulmonic Valve: The pulmonic valve was normal in structure. Pulmonic valve regurgitation is trivial. No evidence of pulmonic stenosis. Aorta: The aortic root is normal in size and structure. Venous: The inferior vena cava is normal in size with greater than 50% respiratory variability, suggesting right atrial pressure of 3 mmHg. IAS/Shunts: No atrial level shunt detected by color flow Doppler.  LEFT VENTRICLE PLAX 2D LVIDd:         5.06 cm  Diastology LVIDs:         3.96 cm  LV e' lateral:   5.98 cm/s LV PW:         1.28 cm  LV E/e' lateral: 14.8 LV IVS:        1.26 cm  LV e' medial:    4.90 cm/s LVOT diam:     2.10 cm  LV E/e' medial:  18.1 LV SV:         80 LV SV Index:   34 LVOT Area:     3.46 cm  RIGHT VENTRICLE            IVC RV Basal diam:  2.92 cm    IVC diam: 1.64 cm RV S prime:     5.98 cm/s TAPSE (M-mode): 2.2 cm LEFT ATRIUM              Index       RIGHT ATRIUM           Index LA diam:        4.00 cm  1.69 cm/m  RA Area:     17.80 cm LA Vol (A2C):   89.7 ml  37.89 ml/m RA Volume:   48.10 ml  20.32  ml/m LA Vol (A4C):   101.0 ml 42.67 ml/m LA Biplane Vol: 102.0 ml 43.09 ml/m  AORTIC VALVE LVOT Vmax:   111.00 cm/s LVOT Vmean:  69.000 cm/s LVOT VTI:    0.232 m AI PHT:      546 msec  AORTA Ao Root diam: 4.00 cm Ao Asc diam:  3.50 cm MITRAL VALVE               TRICUSPID VALVE MV Area (PHT): 3.12 cm    TR Peak grad:   17.0 mmHg MV Decel Time: 243 msec    TR Vmax:        206.00 cm/s MV E velocity: 88.80 cm/s MV A velocity: 75.70 cm/s  SHUNTS MV E/A ratio:  1.17        Systemic VTI:  0.23 m                            Systemic Diam: 2.10 cm Fransico Him MD Electronically signed by Fransico Him MD Signature Date/Time: 03/29/2020/1:05:18 PM    Final         Scheduled Meds: . allopurinol  250 mg Oral Daily  . apixaban  5 mg Oral BID  . aspirin EC  81 mg Oral Daily  . atorvastatin  80 mg Oral q1800  . calcitRIOL  0.25 mcg Oral Daily  . carvedilol  25 mg Oral BID WC  . clopidogrel  75 mg Oral Daily  . docusate sodium  100 mg Oral BID  . furosemide  40 mg Intravenous Q12H  . hydrALAZINE  10 mg Oral TID  . ketotifen  1 drop Both Eyes BID  .  pantoprazole  40 mg Oral Daily  . primidone  100 mg Oral BID  . sertraline  25 mg Oral QHS  . sodium chloride flush  3 mL Intravenous Q12H   Continuous Infusions: . sodium chloride       LOS: 1 day    Time spent: [place], MD [facility] Pager [phone]  If 7PM-7AM, please contact night-coverage [email or web address] Password [email or web address] 03/29/2020, 2:21 PM

## 2020-03-29 NOTE — Progress Notes (Signed)
  Echocardiogram 2D Echocardiogram has been performed.  Ike Maragh G Dinh [place] 03/29/2020, 11:24 AM

## 2020-03-29 NOTE — Plan of Care (Signed)

## 2020-03-30 DIAGNOSIS — I5023 Acute on chronic systolic (congestive) heart failure: Secondary | ICD-10-CM | POA: Diagnosis not present

## 2020-03-30 LAB — BASIC METABOLIC PANEL
Anion gap: 9 (ref 5–15)
BUN: 21 mg/dL (ref 8–23)
CO2: 28 mmol/L (ref 22–32)
Calcium: 8.4 mg/dL — ABNORMAL LOW (ref 8.9–10.3)
Chloride: 104 mmol/L (ref 98–111)
Creatinine, Ser: 1.98 mg/dL — ABNORMAL HIGH (ref 0.61–1.24)
GFR calc Af Amer: 37 mL/min — ABNORMAL LOW (ref >60)
GFR calc non Af Amer: 32 mL/min — ABNORMAL LOW (ref >60)
Glucose, Bld: 95 mg/dL (ref 70–99)
Potassium: 3.5 mmol/L (ref 3.5–5.1)
Sodium: 141 mmol/L (ref 135–145)

## 2020-03-30 NOTE — Progress Notes (Signed)
PROGRESS NOTE  BRACK SHADDOCK  WIO:[id number] DOB: 03-11-1946  DOA: 03/28/2020 PCP: Patient, No Pcp Per   Brief Narrative:  ADNAN VANVOORHIS is a 74 y.o. male with medical history significant of afib, on Eliquis; renal cell cancer, 9/12 s/p right partial nephrectomy; stage 4 CKD; CAD s/p CABG;  ischemic cardiomyopathy with an AICD; HTN; OSA; and CVA presenting with progressively worsening SOB associated with cough, PND  and substernal chest pain. ED evaluation revealed normal D-dimer with mild elevated B-type natriuretic peptide acute on chronic CHF   Assessment & Plan:   Principal Problem:   Acute on chronic systolic CHF (congestive heart failure) (HCC) Active Problems:   PAF (paroxysmal atrial fibrillation) (HCC)   Essential hypertension, benign   Chronic gout   CKD (chronic kidney disease) stage 4, GFR 15-29 ml/min (HCC)   Obesity (BMI 30.0-34.9)   Acute on chronic CHF Recent discontinuation of diuretic due to acute gout No acute airspace disease BNP, mildly elevated, doubled from a month ago 2D echo 03/29/2020-EF 42-68%, grade 2 diastolic dysfunction ACE inhibitor held on admission due to acute renal dysfunction-monitor and resume when able Continue Coreg Continue diuretics with monitor electrolytes and renal function Trend troponin Continue O2 supplementation  Chronic kidney disease type III Baseline creatinine 1.5-1.7 Avoid nephrotoxins and monitor creatinine for renal function  Paroxysmal A. fib -Rate controlled with AICD in place -Continue Coreg -Continue Eliquis  History of CVA -Continue Plavix and Lipitor -Continue primidone for tremor  Chronic depression/anxiety -Continue Zoloft   HTN -Held Norvasc in the setting of edema -Continue Coreg, hydralazine -Continue prn hydralazine    Obesity -BMI 30.8 -Weight loss should be encouraged  Chronic gout with recent flare -Recent Uric acid 9.9 -Off Lasix due to hyperuricemia -Continue Allopurinol -Will  need close monitoring since restarting Lasix   DVT prophylaxis: Eliquis Code Status: DNI Family Communication:  Disposition Plan: Home   Consultants:     Procedures:     Antimicrobials:     Subjective: Denies any fever or chills.  No acute events reported overnight  Objective:  Vitals:   03/29/20 2121 03/30/20 0508 03/30/20 1021 03/30/20 1312  BP: (!) 143/65 139/75 (!) 145/62 (!) 161/64  Pulse:  (!) 58  62  Resp:  18  20  Temp:  98.1 F (36.7 C)    TempSrc:  Oral    SpO2:  99%  98%  Weight:  106.5 kg    Height:        Intake/Output Summary (Last 24 hours) at 03/30/2020 1335 Last data filed at 03/30/2020 1100 Gross per 24 hour  Intake 220 ml  Output 1290 ml  Net -1070 ml   Filed Weights   03/28/20 1739 03/29/20 0505 03/30/20 0508  Weight: 112.1 kg 110.9 kg 106.5 kg    Examination:  General exam: Comfortable, no acute distress Respiratory system: Mildly diminished at the bases. Respiratory effort normal. Cardiovascular system: S1 & S2 heard, RRR. No JVD, murmurs, rubs, gallops or clicks. 2+ bipedal  edema. Gastrointestinal system: Abdomen is nondistended, soft and nontender. No organomegaly or masses felt. Normal bowel sounds heard. Central nervous system: Alert and oriented. No focal neurological deficits. Extremities: Symmetric 5 x 5 power. Skin: No rashes, lesions or ulcers Psychiatry: Judgement and insight appear normal. Mood & affect appropriate.     Data Reviewed: I have personally reviewed following labs and imaging studies  CBC: Recent Labs  Lab 03/28/20 1235 03/29/20 0413  WBC 4.5 4.2  NEUTROABS 3.7 3.4  HGB 13.2  11.5*  HCT 40.9 35.5*  MCV 88.3 87.9  PLT 127* 233*   Basic Metabolic Panel: Recent Labs  Lab 03/28/20 1235 03/29/20 0413 03/30/20 0423  NA 140 140 141  K 3.9 3.4* 3.5  CL 106 104 104  CO2 22 25 28   GLUCOSE 95 98 95  BUN 11 16 21   CREATININE 1.43* 1.69* 1.98*  CALCIUM 9.1 8.6* 8.4*   GFR: Estimated Creatinine  Clearance: 42.6 mL/min (A) (by C-G formula based on SCr of 1.98 mg/dL (H)). Liver Function Tests: No results for input(s): AST, ALT, ALKPHOS, BILITOT, PROT, ALBUMIN in the last 168 hours. No results for input(s): LIPASE, AMYLASE in the last 168 hours. No results for input(s): AMMONIA in the last 168 hours. Coagulation Profile: No results for input(s): INR, PROTIME in the last 168 hours. Cardiac Enzymes: No results for input(s): CKTOTAL, CKMB, CKMBINDEX, TROPONINI in the last 168 hours. BNP (last 3 results) No results for input(s): PROBNP in the last 8760 hours. HbA1C: No results for input(s): HGBA1C in the last 72 hours. CBG: No results for input(s): GLUCAP in the last 168 hours. Lipid Profile: Recent Labs    03/29/20 0413  CHOL 119  HDL 32*  LDLCALC 68  TRIG 94  CHOLHDL 3.7   Thyroid Function Tests: No results for input(s): TSH, T4TOTAL, FREET4, T3FREE, THYROIDAB in the last 72 hours. Anemia Panel: No results for input(s): VITAMINB12, FOLATE, FERRITIN, TIBC, IRON, RETICCTPCT in the last 72 hours.  Sepsis Labs: Recent Labs  Lab 03/28/20 1235 03/29/20 0413  WBC 4.5 4.2    Recent Results (from the past 240 hour(s))  SARS CORONAVIRUS 2 (TAT 6-24 HRS) Nasopharyngeal Nasopharyngeal Swab     Status: None   Collection Time: 03/28/20 12:34 PM   Specimen: Nasopharyngeal Swab  Result Value Ref Range Status   SARS Coronavirus 2 NEGATIVE NEGATIVE Final    Comment: (NOTE) SARS-CoV-2 target nucleic acids are NOT DETECTED. The SARS-CoV-2 RNA is generally detectable in upper and lower respiratory specimens during the acute phase of infection. Negative results do not preclude SARS-CoV-2 infection, do not rule out co-infections with other pathogens, and should not be used as the sole basis for treatment or other patient management decisions. Negative results must be combined with clinical observations, patient history, and epidemiological information. The expected result is  Negative. Fact Sheet for Patients: SugarRoll.be Fact Sheet for Healthcare Providers: https://www.woods-mathews.com/ This test is not yet approved or cleared by the [place] FDA and  has been authorized for detection and/or diagnosis of SARS-CoV-2 by FDA under an Emergency Use Authorization (EUA). This EUA will remain  in effect (meaning this test can be used) for the duration of the COVID-19 declaration under Section 56 4(b)(1) of the Act, 21 U.S.C. section 360bbb-3(b)(1), unless the authorization is terminated or revoked sooner. Performed at [facility], [place] [address] [place], [place] [address]          Radiology Studies: ECHOCARDIOGRAM COMPLETE  Result Date: 03/29/2020    ECHOCARDIOGRAM REPORT   Patient Name:   TERREL NESHEIWAT Date of Exam: 03/29/2020 Medical Rec #:  [id number]    Height:       74.0 in Accession #:    [id number]   Weight:       244.5 lb Date of Birth:  1946/05/31    BSA:          2.367 m Patient Age:    17 years     BP:  154/64 mmHg Patient Gender: M            HR:           65 bpm. Exam Location:  Inpatient Procedure: 2D Echo, Cardiac Doppler and Color Doppler Indications:    I50.23 Acute on chronic systolic (congestive) heart failure  History:        Patient has no prior history of Echocardiogram examinations.                 Previous Myocardial Infarction, Pacemaker, Stroke,                 Arrythmias:Atrial Fibrillation, Signs/Symptoms:Dyspnea; Risk                 Factors:Hypertension, Dyslipidemia, Sleep Apnea and GERD. CKD.  Sonographer:    Jonelle Sidle Dance Referring Phys: [place]  1. Left ventricular ejection fraction, by estimation, is 50 to 55%. The left ventricle has low normal function. Left ventricular endocardial border not optimally defined to evaluate regional wall motion. There is mild concentric left ventricular hypertrophy. Left ventricular diastolic parameters are consistent  with Grade II diastolic dysfunction (pseudonormalization). Elevated left ventricular end-diastolic pressure.  2. Right ventricular systolic function is normal. The right ventricular size is normal. There is normal pulmonary artery systolic pressure.  3. Left atrial size was moderately dilated.  4. The mitral valve is normal in structure. Trivial mitral valve regurgitation. No evidence of mitral stenosis.  5. The aortic valve is normal in structure. Aortic valve regurgitation is mild. No aortic stenosis is present.  6. The inferior vena cava is normal in size with greater than 50% respiratory variability, suggesting right atrial pressure of 3 mmHg. FINDINGS  Left Ventricle: Left ventricular ejection fraction, by estimation, is 50 to 55%. The left ventricle has low normal function. Left ventricular endocardial border not optimally defined to evaluate regional wall motion. The left ventricular internal cavity  size was normal in size. There is mild concentric left ventricular hypertrophy. Left ventricular diastolic parameters are consistent with Grade II diastolic dysfunction (pseudonormalization). Elevated left ventricular end-diastolic pressure. Right Ventricle: The right ventricular size is normal. No increase in right ventricular wall thickness. Right ventricular systolic function is normal. There is normal pulmonary artery systolic pressure. The tricuspid regurgitant velocity is 2.06 m/s, and  with an assumed right atrial pressure of 3 mmHg, the estimated right ventricular systolic pressure is 85.6 mmHg. Left Atrium: Left atrial size was moderately dilated. Right Atrium: Right atrial size was normal in size. Pericardium: There is no evidence of pericardial effusion. Mitral Valve: The mitral valve is normal in structure. Normal mobility of the mitral valve leaflets. Trivial mitral valve regurgitation. No evidence of mitral valve stenosis. Tricuspid Valve: The tricuspid valve is normal in structure. Tricuspid valve  regurgitation is not demonstrated. No evidence of tricuspid stenosis. Aortic Valve: The aortic valve is normal in structure. Aortic valve regurgitation is mild. Aortic regurgitation PHT measures 546 msec. No aortic stenosis is present. Pulmonic Valve: The pulmonic valve was normal in structure. Pulmonic valve regurgitation is trivial. No evidence of pulmonic stenosis. Aorta: The aortic root is normal in size and structure. Venous: The inferior vena cava is normal in size with greater than 50% respiratory variability, suggesting right atrial pressure of 3 mmHg. IAS/Shunts: No atrial level shunt detected by color flow Doppler.  LEFT VENTRICLE PLAX 2D LVIDd:         5.06 cm  Diastology LVIDs:  3.96 cm  LV e' lateral:   5.98 cm/s LV PW:         1.28 cm  LV E/e' lateral: 14.8 LV IVS:        1.26 cm  LV e' medial:    4.90 cm/s LVOT diam:     2.10 cm  LV E/e' medial:  18.1 LV SV:         80 LV SV Index:   34 LVOT Area:     3.46 cm  RIGHT VENTRICLE            IVC RV Basal diam:  2.92 cm    IVC diam: 1.64 cm RV S prime:     5.98 cm/s TAPSE (M-mode): 2.2 cm LEFT ATRIUM              Index       RIGHT ATRIUM           Index LA diam:        4.00 cm  1.69 cm/m  RA Area:     17.80 cm LA Vol (A2C):   89.7 ml  37.89 ml/m RA Volume:   48.10 ml  20.32 ml/m LA Vol (A4C):   101.0 ml 42.67 ml/m LA Biplane Vol: 102.0 ml 43.09 ml/m  AORTIC VALVE LVOT Vmax:   111.00 cm/s LVOT Vmean:  69.000 cm/s LVOT VTI:    0.232 m AI PHT:      546 msec  AORTA Ao Root diam: 4.00 cm Ao Asc diam:  3.50 cm MITRAL VALVE               TRICUSPID VALVE MV Area (PHT): 3.12 cm    TR Peak grad:   17.0 mmHg MV Decel Time: 243 msec    TR Vmax:        206.00 cm/s MV E velocity: 88.80 cm/s MV A velocity: 75.70 cm/s  SHUNTS MV E/A ratio:  1.17        Systemic VTI:  0.23 m                            Systemic Diam: 2.10 cm Fransico Him MD Electronically signed by Fransico Him MD Signature Date/Time: 03/29/2020/1:05:18 PM    Final         Scheduled  Meds: . allopurinol  250 mg Oral Daily  . apixaban  5 mg Oral BID  . aspirin EC  81 mg Oral Daily  . atorvastatin  80 mg Oral q1800  . calcitRIOL  0.25 mcg Oral Daily  . carvedilol  25 mg Oral BID WC  . clopidogrel  75 mg Oral Daily  . docusate sodium  100 mg Oral BID  . furosemide  40 mg Intravenous Q12H  . hydrALAZINE  10 mg Oral TID  . ketotifen  1 drop Both Eyes BID  . pantoprazole  40 mg Oral Daily  . primidone  100 mg Oral BID  . sertraline  25 mg Oral QHS  . sodium chloride flush  3 mL Intravenous Q12H   Continuous Infusions: . sodium chloride       LOS: 2 days    Time spent: [place], MD [facility] Pager [phone]  If 7PM-7AM, please contact night-coverage [email or web address] Password [facility] 03/30/2020, 1:35 PM

## 2020-03-31 DIAGNOSIS — I10 Essential (primary) hypertension: Secondary | ICD-10-CM | POA: Diagnosis not present

## 2020-03-31 DIAGNOSIS — J9601 Acute respiratory failure with hypoxia: Secondary | ICD-10-CM | POA: Diagnosis not present

## 2020-03-31 DIAGNOSIS — I5023 Acute on chronic systolic (congestive) heart failure: Secondary | ICD-10-CM | POA: Diagnosis not present

## 2020-03-31 DIAGNOSIS — N184 Chronic kidney disease, stage 4 (severe): Secondary | ICD-10-CM | POA: Diagnosis not present

## 2020-03-31 LAB — BASIC METABOLIC PANEL
Anion gap: 8 (ref 5–15)
BUN: 22 mg/dL (ref 8–23)
CO2: 28 mmol/L (ref 22–32)
Calcium: 8.3 mg/dL — ABNORMAL LOW (ref 8.9–10.3)
Chloride: 104 mmol/L (ref 98–111)
Creatinine, Ser: 1.9 mg/dL — ABNORMAL HIGH (ref 0.61–1.24)
GFR calc Af Amer: 39 mL/min — ABNORMAL LOW (ref >60)
GFR calc non Af Amer: 34 mL/min — ABNORMAL LOW (ref >60)
Glucose, Bld: 91 mg/dL (ref 70–99)
Potassium: 3.2 mmol/L — ABNORMAL LOW (ref 3.5–5.1)
Sodium: 140 mmol/L (ref 135–145)

## 2020-03-31 MED ORDER — POTASSIUM CHLORIDE CRYS ER 20 MEQ PO TBCR
20.0000 meq | EXTENDED_RELEASE_TABLET | Freq: Two times a day (BID) | ORAL | Status: DC
  Administered 2020-03-31 – 2020-04-02 (×5): 20 meq via ORAL
  Filled 2020-03-31 (×6): qty 1

## 2020-03-31 MED ORDER — FUROSEMIDE 10 MG/ML IJ SOLN
40.0000 mg | Freq: Once | INTRAMUSCULAR | Status: AC
  Administered 2020-03-31: 12:00:00 40 mg via INTRAVENOUS
  Filled 2020-03-31: qty 4

## 2020-03-31 MED ORDER — PREDNISONE 50 MG PO TABS
50.0000 mg | ORAL_TABLET | Freq: Every day | ORAL | Status: DC
  Administered 2020-03-31 – 2020-04-04 (×5): 50 mg via ORAL
  Filled 2020-03-31 (×5): qty 1

## 2020-03-31 MED ORDER — FUROSEMIDE 10 MG/ML IJ SOLN
40.0000 mg | Freq: Two times a day (BID) | INTRAMUSCULAR | Status: AC
  Administered 2020-03-31 (×2): 40 mg via INTRAVENOUS
  Filled 2020-03-31 (×2): qty 4

## 2020-03-31 MED ORDER — FUROSEMIDE 20 MG PO TABS
20.0000 mg | ORAL_TABLET | Freq: Every day | ORAL | Status: DC
  Administered 2020-04-01 – 2020-04-04 (×4): 20 mg via ORAL
  Filled 2020-03-31 (×4): qty 1

## 2020-03-31 NOTE — TOC Progression Note (Addendum)
Transition of Care [facility]) - Progression Note    Patient Details  Name: Alexander Austin MRN: [id number] Date of Birth: 18-May-1946  Transition of [facility] CM/SW Contact  Zenon Mayo, RN Phone Number: 03/31/2020, 10:23 AM  Clinical Narrative:    NCM spoke with patient, he states he lives alone in an apartment, in the basement area of his landlord's home, he has an aide 5 days /week for 3 hrs who cleans, fixes his meals, does his laundry and helps him with bathing.  He states he also has an [facility] that helps him with medications. He said the RN is with [facility] for HHRN,HHPT, will need resumption orders.  He has 3 walkers, 2 rollators, and a scooter at home.  NCM wii check with [facility] to see if he is active.  He states he is a [place] patient and goes to the [facility].        Expected Discharge Plan and Services                                                 Social Determinants of Health (SDOH) Interventions    Readmission Risk Interventions No flowsheet data found.

## 2020-03-31 NOTE — Progress Notes (Addendum)
[facility] PROGRESS NOTE    Progress Note  JLON BETKER  XLK:[id number] DOB: [birth year] DOA: 03/28/2020 PCP: Patient, No Pcp Per     Brief Narrative:   JAMARCUS LADUKE is an 74 y.o. male significant for atrial fibrillation on Eliquis, renal cell carcinoma, status post right partial nephrectomy on 08/22/2019, chronic kidney disease stage III ischemic cardiomyopathy with an AICD obstructive sleep apnea CVA comes in with progressively worsening shortness of breath cough and substernal chest pain.  Assessment/Plan:   Acute respiratory failure with hypoxia due to acute on chronic diastolic CHF (congestive heart failure) ([place]): Initially was requiring 2 L of oxygen to keep saturations greater than 92%. His diuretics was recently discontinued due to an acute gout flare. Cardiac biomarkers have remained basically flat.   Continue Coreg we will start her on IV diuretic therapy, he appears fluid overloaded on physical exam we will continue IV diuretic therapy for 1 additional day. At home he is currently on Lasix 20 mg daily.    Chronic kidney disease stage IIIa: With a baseline creatinine around 1.7-2.0, today 1.9.  Paroxysmal atrial fibrillation: Rate control with an AICD in place, continue Coreg and Eliquis.  History of CVA: Continue Lipitor and Plavix.  Chronic depression/anxiety: Continue Zoloft.  Essential hypertension: Held due to lower extremity edema, she was continued on Coreg and hydralazine continue IV hydralazine.  Acute gout flare: With a recent uric acid of 9.9, he was started on allopurinol we will start him on steroids   DVT prophylaxis: Eliquis Family Communication:none Status is: Inpatient  Remains inpatient appropriate because:Hemodynamically unstable   Dispo: The patient is from: Home              Anticipated d/c is to: SNF              Anticipated d/c date is: 2 days              Patient currently is not medically stable to d/c.         Code  Status:     Code Status Orders  (From admission, onward)           Start     Ordered   03/28/20 1559  Limited resuscitation (code)  Continuous    Question Answer Comment  In the event of cardiac or respiratory ARREST: Initiate Code Blue, Call Rapid Response Yes   In the event of cardiac or respiratory ARREST: Perform CPR Yes   In the event of cardiac or respiratory ARREST: Perform Intubation/Mechanical Ventilation No   In the event of cardiac or respiratory ARREST: Use NIPPV/BiPAp only if indicated Yes   In the event of cardiac or respiratory ARREST: Administer ACLS medications if indicated Yes   In the event of cardiac or respiratory ARREST: Perform Defibrillation or Cardioversion if indicated Yes      03/28/20 1559           Code Status History     Date Active Date Inactive Code Status Order ID Comments User Context   03/07/2020 1420 03/10/2020 1701 Partial Code [id number]  Debbe Odea, MD Inpatient   Advance Care Planning Activity      Advance Directive Documentation      Most Recent Value  Type of Advance Directive  Healthcare Power of Attorney, Living will  Pre-existing out of facility DNR order (yellow form or pink MOST form)    "MOST" Form in Place?           IV Access:  Peripheral IV   Procedures and diagnostic studies:   ECHOCARDIOGRAM COMPLETE  Result Date: 03/29/2020    ECHOCARDIOGRAM REPORT   Patient Name:   CHARLTON BOULE Date of Exam: 03/29/2020 Medical Rec #:  [id number]    Height:       74.0 in Accession #:    [id number]   Weight:       244.5 lb Date of Birth:  22-Jul-1946    BSA:          2.367 m Patient Age:    76 years     BP:           154/64 mmHg Patient Gender: M            HR:           65 bpm. Exam Location:  Inpatient Procedure: 2D Echo, Cardiac Doppler and Color Doppler Indications:    I50.23 Acute on chronic systolic (congestive) heart failure  History:        Patient has no prior history of Echocardiogram examinations.                  Previous Myocardial Infarction, Pacemaker, Stroke,                 Arrythmias:Atrial Fibrillation, Signs/Symptoms:Dyspnea; Risk                 Factors:Hypertension, Dyslipidemia, Sleep Apnea and GERD. CKD.  Sonographer:    Jonelle Sidle Dance Referring Phys: [place]  1. Left ventricular ejection fraction, by estimation, is 50 to 55%. The left ventricle has low normal function. Left ventricular endocardial border not optimally defined to evaluate regional wall motion. There is mild concentric left ventricular hypertrophy. Left ventricular diastolic parameters are consistent with Grade II diastolic dysfunction (pseudonormalization). Elevated left ventricular end-diastolic pressure.  2. Right ventricular systolic function is normal. The right ventricular size is normal. There is normal pulmonary artery systolic pressure.  3. Left atrial size was moderately dilated.  4. The mitral valve is normal in structure. Trivial mitral valve regurgitation. No evidence of mitral stenosis.  5. The aortic valve is normal in structure. Aortic valve regurgitation is mild. No aortic stenosis is present.  6. The inferior vena cava is normal in size with greater than 50% respiratory variability, suggesting right atrial pressure of 3 mmHg. FINDINGS  Left Ventricle: Left ventricular ejection fraction, by estimation, is 50 to 55%. The left ventricle has low normal function. Left ventricular endocardial border not optimally defined to evaluate regional wall motion. The left ventricular internal cavity  size was normal in size. There is mild concentric left ventricular hypertrophy. Left ventricular diastolic parameters are consistent with Grade II diastolic dysfunction (pseudonormalization). Elevated left ventricular end-diastolic pressure. Right Ventricle: The right ventricular size is normal. No increase in right ventricular wall thickness. Right ventricular systolic function is normal. There is normal pulmonary artery  systolic pressure. The tricuspid regurgitant velocity is 2.06 m/s, and  with an assumed right atrial pressure of 3 mmHg, the estimated right ventricular systolic pressure is 95.2 mmHg. Left Atrium: Left atrial size was moderately dilated. Right Atrium: Right atrial size was normal in size. Pericardium: There is no evidence of pericardial effusion. Mitral Valve: The mitral valve is normal in structure. Normal mobility of the mitral valve leaflets. Trivial mitral valve regurgitation. No evidence of mitral valve stenosis. Tricuspid Valve: The tricuspid valve is normal in structure. Tricuspid valve regurgitation is not demonstrated. No evidence of tricuspid stenosis. Aortic Valve: The  aortic valve is normal in structure. Aortic valve regurgitation is mild. Aortic regurgitation PHT measures 546 msec. No aortic stenosis is present. Pulmonic Valve: The pulmonic valve was normal in structure. Pulmonic valve regurgitation is trivial. No evidence of pulmonic stenosis. Aorta: The aortic root is normal in size and structure. Venous: The inferior vena cava is normal in size with greater than 50% respiratory variability, suggesting right atrial pressure of 3 mmHg. IAS/Shunts: No atrial level shunt detected by color flow Doppler.  LEFT VENTRICLE PLAX 2D LVIDd:         5.06 cm  Diastology LVIDs:         3.96 cm  LV e' lateral:   5.98 cm/s LV PW:         1.28 cm  LV E/e' lateral: 14.8 LV IVS:        1.26 cm  LV e' medial:    4.90 cm/s LVOT diam:     2.10 cm  LV E/e' medial:  18.1 LV SV:         80 LV SV Index:   34 LVOT Area:     3.46 cm  RIGHT VENTRICLE            IVC RV Basal diam:  2.92 cm    IVC diam: 1.64 cm RV S prime:     5.98 cm/s TAPSE (M-mode): 2.2 cm LEFT ATRIUM              Index       RIGHT ATRIUM           Index LA diam:        4.00 cm  1.69 cm/m  RA Area:     17.80 cm LA Vol (A2C):   89.7 ml  37.89 ml/m RA Volume:   48.10 ml  20.32 ml/m LA Vol (A4C):   101.0 ml 42.67 ml/m LA Biplane Vol: 102.0 ml 43.09 ml/m   AORTIC VALVE LVOT Vmax:   111.00 cm/s LVOT Vmean:  69.000 cm/s LVOT VTI:    0.232 m AI PHT:      546 msec  AORTA Ao Root diam: 4.00 cm Ao Asc diam:  3.50 cm MITRAL VALVE               TRICUSPID VALVE MV Area (PHT): 3.12 cm    TR Peak grad:   17.0 mmHg MV Decel Time: 243 msec    TR Vmax:        206.00 cm/s MV E velocity: 88.80 cm/s MV A velocity: 75.70 cm/s  SHUNTS MV E/A ratio:  1.17        Systemic VTI:  0.23 m                            Systemic Diam: 2.10 cm Fransico Him MD Electronically signed by Fransico Him MD Signature Date/Time: 03/29/2020/1:05:18 PM    Final      Medical Consultants:   None.  Anti-Infectives:   none  Subjective:    Nance Pear he relates his breathing is better but he continues to have left knee and ankle pain, he can relates he cannot lay flat to sleep as he gets short of breath.  Objective:    Vitals:   03/30/20 1312 03/30/20 2021 03/31/20 0607 03/31/20 0826  BP: (!) 161/64 (!) 182/70 (!) 138/57 (!) 156/90  Pulse: 62 78 (!) 57   Resp: 20 19 19    Temp: 98 F (  36.7 C) 98.4 F (36.9 C) 98.1 F (36.7 C)   TempSrc: Oral Oral Oral   SpO2: 98% 96% 97%   Weight:      Height:       SpO2: 97 % O2 Flow Rate (L/min): 2 L/min   Intake/Output Summary (Last 24 hours) at 03/31/2020 0958 Last data filed at 03/30/2020 2100 Gross per 24 hour  Intake 480 ml  Output 1050 ml  Net -570 ml   Filed Weights   03/28/20 1739 03/29/20 0505 03/30/20 0508  Weight: 112.1 kg 110.9 kg 106.5 kg    Exam: General exam: In no acute distress. Respiratory system: Good air movement and clear to auscultation. Cardiovascular system: S1 & S2 heard, RRR.  Positive hepatojugular reflux on physical exam Gastrointestinal system: Abdomen is nondistended, soft and nontender.  Central nervous system: Alert and oriented. No focal neurological deficits. Extremities: No pedal edema. Skin: No rashes, lesions or ulcers  Data Reviewed:    Labs: Basic Metabolic Panel: Recent Labs   Lab 03/28/20 1235 03/28/20 1235 03/29/20 0413 03/30/20 0423  NA 140  --  140 141  K 3.9   < > 3.4* 3.5  CL 106  --  104 104  CO2 22  --  25 28  GLUCOSE 95  --  98 95  BUN 11  --  16 21  CREATININE 1.43*  --  1.69* 1.98*  CALCIUM 9.1  --  8.6* 8.4*   < > = values in this interval not displayed.   GFR Estimated Creatinine Clearance: 42.6 mL/min (A) (by C-G formula based on SCr of 1.98 mg/dL (H)). Liver Function Tests: No results for input(s): AST, ALT, ALKPHOS, BILITOT, PROT, ALBUMIN in the last 168 hours. No results for input(s): LIPASE, AMYLASE in the last 168 hours. No results for input(s): AMMONIA in the last 168 hours. Coagulation profile No results for input(s): INR, PROTIME in the last 168 hours. COVID-19 Labs  Recent Labs    03/28/20 1415  DDIMER 0.49    Lab Results  Component Value Date   SARSCOV2NAA NEGATIVE 03/28/2020   [place] NEGATIVE 03/07/2020    CBC: Recent Labs  Lab 03/28/20 1235 03/29/20 0413  WBC 4.5 4.2  NEUTROABS 3.7 3.4  HGB 13.2 11.5*  HCT 40.9 35.5*  MCV 88.3 87.9  PLT 127* 111*   Cardiac Enzymes: No results for input(s): CKTOTAL, CKMB, CKMBINDEX, TROPONINI in the last 168 hours. BNP (last 3 results) No results for input(s): PROBNP in the last 8760 hours. CBG: No results for input(s): GLUCAP in the last 168 hours. D-Dimer: Recent Labs    03/28/20 1415  DDIMER 0.49   Hgb A1c: No results for input(s): HGBA1C in the last 72 hours. Lipid Profile: Recent Labs    03/29/20 0413  CHOL 119  HDL 32*  LDLCALC 68  TRIG 94  CHOLHDL 3.7   Thyroid function studies: No results for input(s): TSH, T4TOTAL, T3FREE, THYROIDAB in the last 72 hours.  Invalid input(s): FREET3 Anemia work up: No results for input(s): VITAMINB12, FOLATE, FERRITIN, TIBC, IRON, RETICCTPCT in the last 72 hours. Sepsis Labs: Recent Labs  Lab 03/28/20 1235 03/29/20 0413  WBC 4.5 4.2   Microbiology Recent Results (from the past 240 hour(s))  SARS  CORONAVIRUS 2 (TAT 6-24 HRS) Nasopharyngeal Nasopharyngeal Swab     Status: None   Collection Time: 03/28/20 12:34 PM   Specimen: Nasopharyngeal Swab  Result Value Ref Range Status   SARS Coronavirus 2 NEGATIVE NEGATIVE Final    Comment: (  NOTE) SARS-CoV-2 target nucleic acids are NOT DETECTED. The SARS-CoV-2 RNA is generally detectable in upper and lower respiratory specimens during the acute phase of infection. Negative results do not preclude SARS-CoV-2 infection, do not rule out co-infections with other pathogens, and should not be used as the sole basis for treatment or other patient management decisions. Negative results must be combined with clinical observations, patient history, and epidemiological information. The expected result is Negative. Fact Sheet for Patients: SugarRoll.be Fact Sheet for Healthcare Providers: https://www.woods-mathews.com/ This test is not yet approved or cleared by the [place] FDA and  has been authorized for detection and/or diagnosis of SARS-CoV-2 by FDA under an Emergency Use Authorization (EUA). This EUA will remain  in effect (meaning this test can be used) for the duration of the COVID-19 declaration under Section 56 4(b)(1) of the Act, 21 U.S.C. section 360bbb-3(b)(1), unless the authorization is terminated or revoked sooner. Performed at [facility], [place] [address] [place], [place] [address]      Medications:    allopurinol  250 mg Oral Daily   apixaban  5 mg Oral BID   aspirin EC  81 mg Oral Daily   atorvastatin  80 mg Oral q1800   calcitRIOL  0.25 mcg Oral Daily   carvedilol  25 mg Oral BID WC   clopidogrel  75 mg Oral Daily   docusate sodium  100 mg Oral BID   furosemide  40 mg Intravenous Q12H   hydrALAZINE  10 mg Oral TID   ketotifen  1 drop Both Eyes BID   pantoprazole  40 mg Oral Daily   primidone  100 mg Oral BID   sertraline  25 mg Oral QHS   sodium chloride flush   3 mL Intravenous Q12H   Continuous Infusions:  sodium chloride        LOS: 3 days   Charlynne Cousins  [facility]  03/31/2020, 9:58 AM

## 2020-04-01 DIAGNOSIS — J9601 Acute respiratory failure with hypoxia: Secondary | ICD-10-CM | POA: Diagnosis not present

## 2020-04-01 DIAGNOSIS — I10 Essential (primary) hypertension: Secondary | ICD-10-CM | POA: Diagnosis not present

## 2020-04-01 DIAGNOSIS — I5023 Acute on chronic systolic (congestive) heart failure: Secondary | ICD-10-CM | POA: Diagnosis not present

## 2020-04-01 DIAGNOSIS — N184 Chronic kidney disease, stage 4 (severe): Secondary | ICD-10-CM | POA: Diagnosis not present

## 2020-04-01 MED ORDER — POTASSIUM CHLORIDE CRYS ER 20 MEQ PO TBCR
20.0000 meq | EXTENDED_RELEASE_TABLET | Freq: Two times a day (BID) | ORAL | Status: DC
  Administered 2020-04-01 (×2): 20 meq via ORAL
  Filled 2020-04-01: qty 1

## 2020-04-01 MED ORDER — FUROSEMIDE 10 MG/ML IJ SOLN
20.0000 mg | Freq: Once | INTRAMUSCULAR | Status: AC
  Administered 2020-04-01: 11:00:00 20 mg via INTRAVENOUS
  Filled 2020-04-01: qty 2

## 2020-04-01 NOTE — Progress Notes (Signed)
[facility] PROGRESS NOTE    Progress Note  Alexander Austin  IRC:[id number] DOB: 1946-12-15 DOA: 03/28/2020 PCP: Patient, No Pcp Per     Brief Narrative:   Alexander Austin is an 74 y.o. male significant for atrial fibrillation on Eliquis, renal cell carcinoma, status post right partial nephrectomy on 08/22/2019, chronic kidney disease stage III ischemic cardiomyopathy with an AICD obstructive sleep apnea CVA comes in with progressively worsening shortness of breath cough and substernal chest pain.  Assessment/Plan:   Acute respiratory failure with hypoxia due to acute on chronic diastolic CHF (congestive heart failure) ([place]): Initially was requiring 2 L of oxygen to keep saturations greater than 92%. His diuretics was recently discontinued due to an acute gout flare. Cardiac biomarkers have remained basically flat.   Continue Coreg, will continue 1 additional day of IV diuretic therapy appears to be euvolemic on physical exam. We will restart his home dose of Lasix, monitor electrolytes and replete as needed.    Chronic kidney disease stage IIIa: With a baseline creatinine around 1.7-2.0. Contact creatinine continues to remain stable in the setting of IV diuresis and being negative.  Paroxysmal atrial fibrillation: Rate control with an AICD in place, continue Coreg and Eliquis.  History of CVA: Continue Lipitor and Plavix.  Chronic depression/anxiety: Continue Zoloft.  Essential hypertension: Held due to lower extremity edema, she was continued on Coreg and hydralazine continue IV hydralazine.  Acute gout flare: With a recent uric acid of 9.9, continue allopurinol and steroids he relates his ankle pain is better.   DVT prophylaxis: Eliquis Family Communication:none Status is: Inpatient  Remains inpatient appropriate because:Hemodynamically unstable   Dispo: The patient is from: Home              Anticipated d/c is to: SNF              Anticipated d/c date is: 1 days               Patient currently is not medically stable to d/c.     Code Status:     Code Status Orders  (From admission, onward)           Start     Ordered   03/28/20 1559  Limited resuscitation (code)  Continuous    Question Answer Comment  In the event of cardiac or respiratory ARREST: Initiate Code Blue, Call Rapid Response Yes   In the event of cardiac or respiratory ARREST: Perform CPR Yes   In the event of cardiac or respiratory ARREST: Perform Intubation/Mechanical Ventilation No   In the event of cardiac or respiratory ARREST: Use NIPPV/BiPAp only if indicated Yes   In the event of cardiac or respiratory ARREST: Administer ACLS medications if indicated Yes   In the event of cardiac or respiratory ARREST: Perform Defibrillation or Cardioversion if indicated Yes      03/28/20 1559           Code Status History     Date Active Date Inactive Code Status Order ID Comments User Context   03/07/2020 1420 03/10/2020 1701 Partial Code [id number]  Debbe Odea, MD Inpatient   Advance Care Planning Activity      Advance Directive Documentation      Most Recent Value  Type of Advance Directive  Healthcare Power of Attorney, Living will  Pre-existing out of facility DNR order (yellow form or pink MOST form)  --  "MOST" Form in Place?  --  IV Access:    Peripheral IV   Procedures and diagnostic studies:   No results found.   Medical Consultants:    None.  Anti-Infectives:   none  Subjective:    Alexander Austin he relates his left ankle pain is significantly improved compared to yesterday.  He can now bear weight on it.  Objective:    Vitals:   03/31/20 2102 04/01/20 0540 04/01/20 0558 04/01/20 0741  BP: (!) 161/75 (!) 163/76  (!) 147/74  Pulse: 87 60    Resp: 18 18    Temp: 99.1 F (37.3 C) 97.9 F (36.6 C)    TempSrc: Oral Oral    SpO2: 96% 94%  94%  Weight:   108.4 kg   Height:       SpO2: 94 % O2 Flow Rate (L/min): 2  L/min   Intake/Output Summary (Last 24 hours) at 04/01/2020 0951 Last data filed at 04/01/2020 0600 Gross per 24 hour  Intake 600 ml  Output 1750 ml  Net -1150 ml   Filed Weights   03/29/20 0505 03/30/20 0508 04/01/20 0558  Weight: 110.9 kg 106.5 kg 108.4 kg    Exam: General exam: In no acute distress. Respiratory system: Good air movement and clear to auscultation. Cardiovascular system: S1 & S2 heard, RRR.  No JVD. Gastrointestinal system: Abdomen is nondistended, soft and nontender.  Central nervous system: Alert and oriented. No focal neurological deficits. Extremities: No pedal edema. Skin: No rashes, lesions or ulcers  Data Reviewed:    Labs: Basic Metabolic Panel: Recent Labs  Lab 03/28/20 1235 03/28/20 1235 03/29/20 0413 03/29/20 0413 03/30/20 0423 03/31/20 0508  NA 140  --  140  --  141 140  K 3.9   < > 3.4*   < > 3.5 3.2*  CL 106  --  104  --  104 104  CO2 22  --  25  --  28 28  GLUCOSE 95  --  98  --  95 91  BUN 11  --  16  --  21 22  CREATININE 1.43*  --  1.69*  --  1.98* 1.90*  CALCIUM 9.1  --  8.6*  --  8.4* 8.3*   < > = values in this interval not displayed.   GFR Estimated Creatinine Clearance: 44.7 mL/min (A) (by C-G formula based on SCr of 1.9 mg/dL (H)). Liver Function Tests: No results for input(s): AST, ALT, ALKPHOS, BILITOT, PROT, ALBUMIN in the last 168 hours. No results for input(s): LIPASE, AMYLASE in the last 168 hours. No results for input(s): AMMONIA in the last 168 hours. Coagulation profile No results for input(s): INR, PROTIME in the last 168 hours. COVID-19 Labs  No results for input(s): DDIMER, FERRITIN, LDH, CRP in the last 72 hours.  Lab Results  Component Value Date   SARSCOV2NAA NEGATIVE 03/28/2020   [place] NEGATIVE 03/07/2020    CBC: Recent Labs  Lab 03/28/20 1235 03/29/20 0413  WBC 4.5 4.2  NEUTROABS 3.7 3.4  HGB 13.2 11.5*  HCT 40.9 35.5*  MCV 88.3 87.9  PLT 127* 111*   Cardiac Enzymes: No results  for input(s): CKTOTAL, CKMB, CKMBINDEX, TROPONINI in the last 168 hours. BNP (last 3 results) No results for input(s): PROBNP in the last 8760 hours. CBG: No results for input(s): GLUCAP in the last 168 hours. D-Dimer: No results for input(s): DDIMER in the last 72 hours. Hgb A1c: No results for input(s): HGBA1C in the last 72 hours. Lipid Profile: No results  for input(s): CHOL, HDL, LDLCALC, TRIG, CHOLHDL, LDLDIRECT in the last 72 hours. Thyroid function studies: No results for input(s): TSH, T4TOTAL, T3FREE, THYROIDAB in the last 72 hours.  Invalid input(s): FREET3 Anemia work up: No results for input(s): VITAMINB12, FOLATE, FERRITIN, TIBC, IRON, RETICCTPCT in the last 72 hours. Sepsis Labs: Recent Labs  Lab 03/28/20 1235 03/29/20 0413  WBC 4.5 4.2   Microbiology Recent Results (from the past 240 hour(s))  SARS CORONAVIRUS 2 (TAT 6-24 HRS) Nasopharyngeal Nasopharyngeal Swab     Status: None   Collection Time: 03/28/20 12:34 PM   Specimen: Nasopharyngeal Swab  Result Value Ref Range Status   SARS Coronavirus 2 NEGATIVE NEGATIVE Final    Comment: (NOTE) SARS-CoV-2 target nucleic acids are NOT DETECTED. The SARS-CoV-2 RNA is generally detectable in upper and lower respiratory specimens during the acute phase of infection. Negative results do not preclude SARS-CoV-2 infection, do not rule out co-infections with other pathogens, and should not be used as the sole basis for treatment or other patient management decisions. Negative results must be combined with clinical observations, patient history, and epidemiological information. The expected result is Negative. Fact Sheet for Patients: SugarRoll.be Fact Sheet for Healthcare Providers: https://www.woods-mathews.com/ This test is not yet approved or cleared by the [place] FDA and  has been authorized for detection and/or diagnosis of SARS-CoV-2 by FDA under an Emergency Use  Authorization (EUA). This EUA will remain  in effect (meaning this test can be used) for the duration of the COVID-19 declaration under Section 56 4(b)(1) of the Act, 21 U.S.C. section 360bbb-3(b)(1), unless the authorization is terminated or revoked sooner. Performed at [facility], [place] [address] [place], [place] [address]      Medications:   . allopurinol  250 mg Oral Daily  . apixaban  5 mg Oral BID  . aspirin EC  81 mg Oral Daily  . atorvastatin  80 mg Oral q1800  . calcitRIOL  0.25 mcg Oral Daily  . carvedilol  25 mg Oral BID WC  . clopidogrel  75 mg Oral Daily  . docusate sodium  100 mg Oral BID  . furosemide  20 mg Intravenous Once  . furosemide  20 mg Oral Daily  . hydrALAZINE  10 mg Oral TID  . ketotifen  1 drop Both Eyes BID  . pantoprazole  40 mg Oral Daily  . potassium chloride  20 mEq Oral BID  . potassium chloride  20 mEq Oral BID  . predniSONE  50 mg Oral Q breakfast  . primidone  100 mg Oral BID  . sertraline  25 mg Oral QHS  . sodium chloride flush  3 mL Intravenous Q12H   Continuous Infusions: . sodium chloride        LOS: 4 days   Charlynne Cousins  [facility]  04/01/2020, 9:51 AM

## 2020-04-01 NOTE — Care Management Important Message (Signed)
Important Message  Patient Details  Name: TOR TSUDA MRN: [id number] Date of Birth: 12-05-1946   Medicare Important Message Given:  Yes     Shelda Altes 04/01/2020, 10:02 AM

## 2020-04-01 NOTE — Evaluation (Signed)
Physical Therapy Evaluation Patient Details Name: Alexander Austin MRN: [id number] DOB: Dec 23, 1945 Today's Date: 04/01/2020   History of Present Illness  74 y.o. male significant for atrial fibrillation on Eliquis, renal cell carcinoma, status post right partial nephrectomy on 08/22/2019, chronic kidney disease stage III ischemic cardiomyopathy with an AICD obstructive sleep apnea CVA comes in with progressively worsening shortness of breath cough and substernal chest pain. Admitted 03/28/20 for Acute respiratory failure with hypoxia due to acute on chronic diastolic CHF and acute gout flare in L LE  Clinical Impression  Pt states " I knew you were coming.", however is very cooperative with Evaluation. PTA pt living in lower level apartment with 1 step to enter. Pt reports ambulation with RW or cane depending on how he is feeling. Pt has an aide 3 hrs/day 5 days/wk and she assists with bathing and dressing as well as iADLs. Pt landlord assists with transportation. Pt is currently limited in safe mobility by pain in his L LE from gout flare, in presence of decreased ROM, strength and endurance. While working with therapy today pt requested to continue to work with therapy. Pt agreeable to HHPT recommendation. PT will continue to follow acutely.     Follow Up Recommendations Home health PT;Supervision - Intermittent    Equipment Recommendations  None recommended by PT       Precautions / Restrictions Precautions Precautions: Fall Restrictions Weight Bearing Restrictions: No      Mobility  Bed Mobility Overal bed mobility: Modified Independent             General bed mobility comments: increased time and effort, HoB slightly elevated  Transfers Overall transfer level: Needs assistance Equipment used: Rolling walker (2 wheeled) Transfers: Sit to/from Stand Sit to Stand: Min guard         General transfer comment: min guard for safety, with mild unsteadiness coming into standing, no  overt LoB  Ambulation/Gait Ambulation/Gait assistance: Min guard;Supervision Gait Distance (Feet): 180 Feet Assistive device: Rolling walker (2 wheeled) Gait Pattern/deviations: Shuffle;Wide base of support Gait velocity: slowed Gait velocity interpretation: 1.31 - 2.62 ft/sec, indicative of limited community ambulator General Gait Details: min guard progressing to supervision for safety, decreased L knee ROM causing slightly crouched posture with gait        Balance Overall balance assessment: Needs assistance Sitting-balance support: Feet unsupported;Bilateral upper extremity supported Sitting balance-Leahy Scale: Good     Standing balance support: No upper extremity supported;During functional activity;Single extremity supported Standing balance-Leahy Scale: Fair Standing balance comment: able to static stand without support                             Pertinent Vitals/Pain Pain Assessment: 0-10 Pain Score: 3  Pain Location: L foot Pain Descriptors / Indicators: Aching;Pressure Pain Intervention(s): Limited activity within patient's tolerance;Monitored during session;Repositioned    Home Living Family/patient expects to be discharged to:: Private residence Living Arrangements: Alone Available Help at Discharge: Friend(s);Personal care attendant;Available PRN/intermittently Type of Home: Apartment Home Access: Level entry     Home Layout: One level Home Equipment: Walker - 4 wheels;Grab bars - tub/shower;Shower seat;Cane - single point Additional Comments: Has 3  canes, 2 RWs and a scooter    Prior Function Level of Independence: Needs assistance   Gait / Transfers Assistance Needed: Ambulates primarily in the apt, but plans to ask aide to take him across the street to a part for additional walking on good weather  days  ADL's / Homemaking Assistance Needed: Has an aide 5 days per week, 3 hours daily  Comments: He states that he orders his groceries and  the landlord and others are helpful re taking him to appts     Hand Dominance   Dominant Hand: Right    Extremity/Trunk Assessment   Upper Extremity Assessment Upper Extremity Assessment: Overall WFL for tasks assessed    Lower Extremity Assessment Lower Extremity Assessment: LLE deficits/detail;Generalized weakness LLE Deficits / Details: L knee and ankle lacking full ROM and strength grossly 3/5        Communication   Communication: No difficulties  Cognition Arousal/Alertness: Awake/alert Behavior During Therapy: WFL for tasks assessed/performed Overall Cognitive Status: Within Functional Limits for tasks assessed                                        General Comments General comments (skin integrity, edema, etc.): VSS on RA        Assessment/Plan    PT Assessment Patient needs continued PT services  PT Problem List Decreased range of motion;Decreased activity tolerance;Decreased balance;Decreased mobility;Decreased safety awareness;Pain       PT Treatment Interventions DME instruction;Gait training;Stair training;Functional mobility training;Therapeutic activities;Therapeutic exercise;Balance training;Cognitive remediation;Patient/family education    PT Goals (Current goals can be found in the Care Plan section)  Acute Rehab PT Goals Patient Stated Goal: improve his mobility  PT Goal Formulation: With patient Time For Goal Achievement: 04/15/20 Potential to Achieve Goals: Good    Frequency Min 3X/week    AM-PAC PT "6 Clicks" Mobility  Outcome Measure Help needed turning from your back to your side while in a flat bed without using bedrails?: None Help needed moving from lying on your back to sitting on the side of a flat bed without using bedrails?: A Little Help needed moving to and from a bed to a chair (including a wheelchair)?: None Help needed standing up from a chair using your arms (e.g., wheelchair or bedside chair)?: None Help  needed to walk in hospital room?: A Little Help needed climbing 3-5 steps with a railing? : A Little 6 Click Score: 21    End of Session Equipment Utilized During Treatment: Gait belt Activity Tolerance: Patient tolerated treatment well Patient left: in chair;with call bell/phone within reach Nurse Communication: Mobility status PT Visit Diagnosis: Muscle weakness (generalized) (M62.81);Other abnormalities of gait and mobility (R26.89);Difficulty in walking, not elsewhere classified (R26.2)    Time: 4665-9935 PT Time Calculation (min) (ACUTE ONLY): 24 min   Charges:   PT Evaluation $PT Eval Moderate Complexity: 1 Mod PT Treatments $Gait Training: 8-22 mins        Bradyn Vassey B. Migdalia Dk PT, DPT Acute Rehabilitation Services Pager [phone] Office [phone]   [place] 04/01/2020, 1:58 PM

## 2020-04-02 ENCOUNTER — Inpatient Hospital Stay (HOSPITAL_COMMUNITY): Payer: Medicare Other

## 2020-04-02 DIAGNOSIS — Z95810 Presence of automatic (implantable) cardiac defibrillator: Secondary | ICD-10-CM | POA: Diagnosis not present

## 2020-04-02 DIAGNOSIS — M10072 Idiopathic gout, left ankle and foot: Secondary | ICD-10-CM

## 2020-04-02 DIAGNOSIS — I5023 Acute on chronic systolic (congestive) heart failure: Secondary | ICD-10-CM | POA: Diagnosis not present

## 2020-04-02 DIAGNOSIS — J9601 Acute respiratory failure with hypoxia: Secondary | ICD-10-CM

## 2020-04-02 DIAGNOSIS — I480 Paroxysmal atrial fibrillation: Secondary | ICD-10-CM

## 2020-04-02 LAB — BASIC METABOLIC PANEL
Anion gap: 12 (ref 5–15)
BUN: 34 mg/dL — ABNORMAL HIGH (ref 8–23)
CO2: 26 mmol/L (ref 22–32)
Calcium: 8.8 mg/dL — ABNORMAL LOW (ref 8.9–10.3)
Chloride: 102 mmol/L (ref 98–111)
Creatinine, Ser: 2.02 mg/dL — ABNORMAL HIGH (ref 0.61–1.24)
GFR calc Af Amer: 37 mL/min — ABNORMAL LOW (ref >60)
GFR calc non Af Amer: 32 mL/min — ABNORMAL LOW (ref >60)
Glucose, Bld: 102 mg/dL — ABNORMAL HIGH (ref 70–99)
Potassium: 3.5 mmol/L (ref 3.5–5.1)
Sodium: 140 mmol/L (ref 135–145)

## 2020-04-02 MED ORDER — HYDRALAZINE HCL 25 MG PO TABS
25.0000 mg | ORAL_TABLET | Freq: Three times a day (TID) | ORAL | Status: DC
  Administered 2020-04-03 – 2020-04-04 (×4): 25 mg via ORAL
  Filled 2020-04-02 (×4): qty 1

## 2020-04-02 MED ORDER — AMLODIPINE BESYLATE 10 MG PO TABS: 10.0000 mg | ORAL_TABLET | Freq: Every day | ORAL | Status: DC

## 2020-04-02 MED ORDER — POTASSIUM CHLORIDE CRYS ER 20 MEQ PO TBCR
40.0000 meq | EXTENDED_RELEASE_TABLET | Freq: Every day | ORAL | Status: DC
  Administered 2020-04-02: 11:00:00 20 meq via ORAL
  Administered 2020-04-03 – 2020-04-04 (×2): 40 meq via ORAL
  Filled 2020-04-02 (×3): qty 2

## 2020-04-02 NOTE — Progress Notes (Signed)
PROGRESS NOTE  Alexander Austin KTG:[id number] DOB: 1946/09/15 DOA: 03/28/2020 PCP: Patient, No Pcp Per   Alexander Austin is an 74 y.o. male significant for atrial fibrillation on Eliquis, renal cell carcinoma, status post right partial nephrectomy on 08/22/2019, chronic kidney disease stage III ischemic cardiomyopathy with an AICD obstructive sleep apnea CVA comes in with progressively worsening shortness of breath cough and substernal chest pain.   HPI/Recap of past 24 hours:  Complaint of left foot pain, not able to bear weight on left foot, he refused to be discharged until his left foot pain improves Report breathing is better, no cough, no hypoxia, lower extremity edema has improved Cr got worse  Assessment/Plan: Principal Problem:   Acute on chronic systolic CHF (congestive heart failure) (HCC) Active Problems:   PAF (paroxysmal atrial fibrillation) (HCC)   Essential hypertension, benign   Chronic gout   CKD (chronic kidney disease) stage 4, GFR 15-29 ml/min (HCC)   Obesity (BMI 30.0-34.9)   Acute respiratory failure with hypoxia (HCC)  Acute respiratory failure with hypoxia due to acute on chronic diastolic CHF (congestive heart failure) ([place]): lvef 50-55%, +grade II diastolic dysfunction on echo Initially was requiring 2 L of oxygen to keep saturations greater than 92%. His diuretics was recently discontinued due to an acute gout flare. Cardiac biomarkers have remained basically flat.   ddimer 0.49 He received iv lasix, now on oral lasix and coreg norvasc held on admission due to edema Cr increasing  Paroxysmal atrial fibrillation: Rate control with an AICD in place, paced rhytym continue Coreg and Eliquis  History of CVA: Continue Lipitor, Plavix, eliquis  HTN:  bp elevated continue coreg,  Started lasix Increase hydralazine Hold norvasc due to lower extremity edema  Hypokalema, replace k, repeat in am  Chronic kidney disease stage IIIa with right partial  nephrectomy on 08/22/2019 Cr 1.4 on presentation, cr today is 2.02 Repeat bmp in am, Monitor renal function, renal dosing meds   Left foot pain Left foot x ray He does has elevated lactic acid at 9.9,  no significant  Erythema , does has generalized foot tenderness, ankle slightly edematous Continue allopurinol, prednisone    .Body mass index is 30.98 kg/m.   DVT Prophylaxis:elquis  Code Status: partial   Family Communication: patient   Disposition Plan:    Patient came from:                 home                                                                                         Anticipated d/c place: home with home health  Barriers to d/c OR conditions which need to be met to effect a safe d/c: left foot pain, not able to bear weight, monitor cr and bp control   Consultants:  none  Procedures:  none  Antibiotics:  none   Objective: BP (!) 152/62 (BP Location: Left Arm)   Pulse (!) 51   Temp 97.7 F (36.5 C) (Oral)   Resp 17   Ht 6' 2"  (1.88 m)   Wt 109.5 kg   SpO2 98%  BMI 30.98 kg/m   Intake/Output Summary (Last 24 hours) at 04/02/2020 0903 Last data filed at 04/02/2020 0817 Gross per 24 hour  Intake 1180 ml  Output 476 ml  Net 704 ml   Filed Weights   03/30/20 0508 04/01/20 0558 04/02/20 0532  Weight: 106.5 kg 108.4 kg 109.5 kg    Exam: Patient is examined daily including today on 04/02/2020, exams remain the same as of yesterday except that has changed    General:  NAD  Cardiovascular: paced rhythm  Respiratory: CTABL  Abdomen: Soft/ND/NT, positive BS  Musculoskeletal: left ankle trace  Edema  Neuro: alert, oriented   Data Reviewed: Basic Metabolic Panel: Recent Labs  Lab 03/28/20 1235 03/29/20 0413 03/30/20 0423 03/31/20 0508 04/02/20 0737  NA 140 140 141 140 140  K 3.9 3.4* 3.5 3.2* 3.5  CL 106 104 104 104 102  CO2 22 25 28 28 26   GLUCOSE 95 98 95 91 102*  BUN 11 16 21 22  34*  CREATININE 1.43* 1.69* 1.98* 1.90*  2.02*  CALCIUM 9.1 8.6* 8.4* 8.3* 8.8*   Liver Function Tests: No results for input(s): AST, ALT, ALKPHOS, BILITOT, PROT, ALBUMIN in the last 168 hours. No results for input(s): LIPASE, AMYLASE in the last 168 hours. No results for input(s): AMMONIA in the last 168 hours. CBC: Recent Labs  Lab 03/28/20 1235 03/29/20 0413  WBC 4.5 4.2  NEUTROABS 3.7 3.4  HGB 13.2 11.5*  HCT 40.9 35.5*  MCV 88.3 87.9  PLT 127* 111*   Cardiac Enzymes:   No results for input(s): CKTOTAL, CKMB, CKMBINDEX, TROPONINI in the last 168 hours. BNP (last 3 results) Recent Labs    03/07/20 0403 03/28/20 1235  BNP 106.1* 274.0*    ProBNP (last 3 results) No results for input(s): PROBNP in the last 8760 hours.  CBG: No results for input(s): GLUCAP in the last 168 hours.  Recent Results (from the past 240 hour(s))  SARS CORONAVIRUS 2 (TAT 6-24 HRS) Nasopharyngeal Nasopharyngeal Swab     Status: None   Collection Time: 03/28/20 12:34 PM   Specimen: Nasopharyngeal Swab  Result Value Ref Range Status   SARS Coronavirus 2 NEGATIVE NEGATIVE Final    Comment: (NOTE) SARS-CoV-2 target nucleic acids are NOT DETECTED. The SARS-CoV-2 RNA is generally detectable in upper and lower respiratory specimens during the acute phase of infection. Negative results do not preclude SARS-CoV-2 infection, do not rule out co-infections with other pathogens, and should not be used as the sole basis for treatment or other patient management decisions. Negative results must be combined with clinical observations, patient history, and epidemiological information. The expected result is Negative. Fact Sheet for Patients: SugarRoll.be Fact Sheet for Healthcare Providers: https://www.woods-mathews.com/ This test is not yet approved or cleared by the [place] FDA and  has been authorized for detection and/or diagnosis of SARS-CoV-2 by FDA under an Emergency Use Authorization  (EUA). This EUA will remain  in effect (meaning this test can be used) for the duration of the COVID-19 declaration under Section 56 4(b)(1) of the Act, 21 U.S.C. section 360bbb-3(b)(1), unless the authorization is terminated or revoked sooner. Performed at [facility], [place] [address], [place], [place] [address]      Studies: No results found.  Scheduled Meds: . allopurinol  250 mg Oral Daily  . apixaban  5 mg Oral BID  . aspirin EC  81 mg Oral Daily  . atorvastatin  80 mg Oral q1800  . calcitRIOL  0.25 mcg Oral  Daily  . carvedilol  25 mg Oral BID WC  . clopidogrel  75 mg Oral Daily  . docusate sodium  100 mg Oral BID  . furosemide  20 mg Oral Daily  . hydrALAZINE  10 mg Oral TID  . ketotifen  1 drop Both Eyes BID  . pantoprazole  40 mg Oral Daily  . potassium chloride  20 mEq Oral BID  . potassium chloride  20 mEq Oral BID  . predniSONE  50 mg Oral Q breakfast  . primidone  100 mg Oral BID  . sertraline  25 mg Oral QHS  . sodium chloride flush  3 mL Intravenous Q12H    Continuous Infusions: . sodium chloride       Time spent: 85mns I have personally reviewed and interpreted on  04/02/2020 daily labs, tele strips, imagings as discussed above under date review session and assessment and plans.  I reviewed all nursing notes, pharmacy notes,  vitals, pertinent old records  I have discussed plan of care as described above with RN , patient  on 04/02/2020   FFlorencia ReasonsMD, PhD, FACP  [facility]  Available via Epic secure chat 7am-7pm for nonurgent issues Please page for urgent issues, pager number available through [email or web address] .   04/02/2020, 9:03 AM  LOS: 5 days

## 2020-04-02 NOTE — Discharge Instructions (Signed)

## 2020-04-02 NOTE — Progress Notes (Signed)
Physical Therapy Treatment Patient Details Name: Alexander Austin MRN: [id number] DOB: [birth year] Today's Date: 04/02/2020    History of Present Illness 74 y.o. male significant for atrial fibrillation on Eliquis, renal cell carcinoma, status post right partial nephrectomy on 08/22/2019, chronic kidney disease stage III ischemic cardiomyopathy with an AICD obstructive sleep apnea CVA comes in with progressively worsening shortness of breath cough and substernal chest pain. Admitted 03/28/20 for Acute respiratory failure with hypoxia due to acute on chronic diastolic CHF and acute gout flare in L LE    PT Comments    Pt supine in bed in dark on entry. Pt agreeable to therapy but becomes tearful as he relates he is becoming overwhelmed by his medical condition. PT listened and asked if he would like to talk to Palliative Care. Pt refuses at this time. PT opened windows to let light in and started session with gentle LE exercise and soft tissue mobilization. Pt reports he is ready for ambulation. Pt is mod I for bed mobility, min guard for transfers and supervision for ambulation of 250 feet with RW. D/c plans remain appropriate. PT will continue to follow acutely.    Follow Up Recommendations  Home health PT;Supervision - Intermittent     Equipment Recommendations  None recommended by PT       Precautions / Restrictions Precautions Precautions: Fall Restrictions Weight Bearing Restrictions: No    Mobility  Bed Mobility Overal bed mobility: Modified Independent             General bed mobility comments: use of bed rail for support  Transfers Overall transfer level: Needs assistance Equipment used: Rolling walker (2 wheeled) Transfers: Sit to/from Stand Sit to Stand: Min guard         General transfer comment: min guard for safety, good power up and steadying  Ambulation/Gait Ambulation/Gait assistance: Supervision Gait Distance (Feet): 250 Feet Assistive device: Rolling walker  (2 wheeled) Gait Pattern/deviations: Shuffle;Wide base of support Gait velocity: slowed Gait velocity interpretation: 1.31 - 2.62 ft/sec, indicative of limited community ambulator General Gait Details: supervision for initially strong gait, increasing forward flexed posture and decreased cadence with progression, however safe       Balance Overall balance assessment: Needs assistance Sitting-balance support: Feet unsupported;Bilateral upper extremity supported Sitting balance-Leahy Scale: Good     Standing balance support: No upper extremity supported;During functional activity;Single extremity supported Standing balance-Leahy Scale: Fair Standing balance comment: able to static stand without support                            Cognition Arousal/Alertness: Awake/alert Behavior During Therapy: WFL for tasks assessed/performed Overall Cognitive Status: Within Functional Limits for tasks assessed                                        Exercises General Exercises - Lower Extremity Ankle Circles/Pumps: AROM;AAROM;Both;10 reps;Supine    General Comments General comments (skin integrity, edema, etc.): VSS on RA      Pertinent Vitals/Pain Pain Assessment: 0-10 Pain Score: 4  Pain Location: L foot Pain Descriptors / Indicators: Aching;Pressure Pain Intervention(s): Limited activity within patient's tolerance;Monitored during session;Repositioned           PT Goals (current goals can now be found in the care plan section) Acute Rehab PT Goals Patient Stated Goal: improve his mobility  PT Goal Formulation:  With patient Time For Goal Achievement: 04/15/20 Potential to Achieve Goals: Good Progress towards PT goals: Progressing toward goals    Frequency    Min 3X/week      PT Plan Current plan remains appropriate       AM-PAC PT "6 Clicks" Mobility   Outcome Measure  Help needed turning from your back to your side while in a flat bed  without using bedrails?: None Help needed moving from lying on your back to sitting on the side of a flat bed without using bedrails?: A Little Help needed moving to and from a bed to a chair (including a wheelchair)?: None Help needed standing up from a chair using your arms (e.g., wheelchair or bedside chair)?: None Help needed to walk in hospital room?: A Little Help needed climbing 3-5 steps with a railing? : A Little 6 Click Score: 21    End of Session Equipment Utilized During Treatment: Gait belt Activity Tolerance: Patient tolerated treatment well Patient left: in chair;with call bell/phone within reach Nurse Communication: Mobility status PT Visit Diagnosis: Muscle weakness (generalized) (M62.81);Other abnormalities of gait and mobility (R26.89);Difficulty in walking, not elsewhere classified (R26.2)     Time: 3354-5625 PT Time Calculation (min) (ACUTE ONLY): 29 min  Charges:  $Gait Training: 8-22 mins $Therapeutic Exercise: 8-22 mins                     Jaimie Redditt B. Migdalia Dk PT, DPT Acute Rehabilitation Services Pager [phone] Office [phone]    [place] 04/02/2020, 6:06 PM

## 2020-04-03 DIAGNOSIS — I5023 Acute on chronic systolic (congestive) heart failure: Secondary | ICD-10-CM | POA: Diagnosis not present

## 2020-04-03 DIAGNOSIS — I480 Paroxysmal atrial fibrillation: Secondary | ICD-10-CM | POA: Diagnosis not present

## 2020-04-03 DIAGNOSIS — J9601 Acute respiratory failure with hypoxia: Secondary | ICD-10-CM | POA: Diagnosis not present

## 2020-04-03 DIAGNOSIS — Z95810 Presence of automatic (implantable) cardiac defibrillator: Secondary | ICD-10-CM | POA: Diagnosis not present

## 2020-04-03 LAB — CBC WITH DIFFERENTIAL/PLATELET
Abs Immature Granulocytes: 0.01 10*3/uL (ref 0.00–0.07)
Basophils Absolute: 0 10*3/uL (ref 0.0–0.1)
Basophils Relative: 0 %
Eosinophils Absolute: 0 10*3/uL (ref 0.0–0.5)
Eosinophils Relative: 1 %
HCT: 36.3 % — ABNORMAL LOW (ref 39.0–52.0)
Hemoglobin: 11.6 g/dL — ABNORMAL LOW (ref 13.0–17.0)
Immature Granulocytes: 0 %
Lymphocytes Relative: 20 %
Lymphs Abs: 0.9 10*3/uL (ref 0.7–4.0)
MCH: 28 pg (ref 26.0–34.0)
MCHC: 32 g/dL (ref 30.0–36.0)
MCV: 87.5 fL (ref 80.0–100.0)
Monocytes Absolute: 0.3 10*3/uL (ref 0.1–1.0)
Monocytes Relative: 6 %
Neutro Abs: 3.2 10*3/uL (ref 1.7–7.7)
Neutrophils Relative %: 73 %
Platelets: 134 10*3/uL — ABNORMAL LOW (ref 150–400)
RBC: 4.15 MIL/uL — ABNORMAL LOW (ref 4.22–5.81)
RDW: 15.5 % (ref 11.5–15.5)
WBC: 4.4 10*3/uL (ref 4.0–10.5)
nRBC: 0 % (ref 0.0–0.2)

## 2020-04-03 LAB — BASIC METABOLIC PANEL
Anion gap: 10 (ref 5–15)
BUN: 34 mg/dL — ABNORMAL HIGH (ref 8–23)
CO2: 25 mmol/L (ref 22–32)
Calcium: 8.8 mg/dL — ABNORMAL LOW (ref 8.9–10.3)
Chloride: 104 mmol/L (ref 98–111)
Creatinine, Ser: 1.97 mg/dL — ABNORMAL HIGH (ref 0.61–1.24)
GFR calc Af Amer: 38 mL/min — ABNORMAL LOW (ref >60)
GFR calc non Af Amer: 33 mL/min — ABNORMAL LOW (ref >60)
Glucose, Bld: 92 mg/dL (ref 70–99)
Potassium: 3.4 mmol/L — ABNORMAL LOW (ref 3.5–5.1)
Sodium: 139 mmol/L (ref 135–145)

## 2020-04-03 MED ORDER — COLCHICINE 0.3 MG HALF TABLET
0.3000 mg | ORAL_TABLET | Freq: Every day | ORAL | Status: DC
  Administered 2020-04-03 – 2020-04-04 (×2): 0.3 mg via ORAL
  Filled 2020-04-03 (×2): qty 1

## 2020-04-03 MED ORDER — POTASSIUM CHLORIDE CRYS ER 20 MEQ PO TBCR
40.0000 meq | EXTENDED_RELEASE_TABLET | Freq: Once | ORAL | Status: AC
  Administered 2020-04-03: 40 meq via ORAL
  Filled 2020-04-03: qty 2

## 2020-04-03 NOTE — Progress Notes (Signed)
PROGRESS NOTE  UNIQUE SEARFOSS XVQ:[id number] DOB: 1946/11/10 DOA: 03/28/2020 PCP: Patient, No Pcp Per   ALBY SCHWABE is an 74 y.o. male significant for atrial fibrillation on Eliquis, renal cell carcinoma, status post right partial nephrectomy on 08/22/2019, chronic kidney disease stage III ischemic cardiomyopathy with an AICD obstructive sleep apnea CVA comes in with progressively worsening shortness of breath cough and substernal chest pain.   HPI/Recap of past 24 hours:  Continue to Complaint of left foot pain, not able to bear weight on left foot, he refused to be discharged until his left foot pain improves Report breathing is better, no cough, no hypoxia, lower extremity edema has improved Cr hover around 2 Landlord at bedside  Assessment/Plan: Principal Problem:   Acute on chronic systolic CHF (congestive heart failure) (HCC) Active Problems:   PAF (paroxysmal atrial fibrillation) (HCC)   Essential hypertension, benign   Chronic gout   CKD (chronic kidney disease) stage 4, GFR 15-29 ml/min (HCC)   Obesity (BMI 30.0-34.9)   Acute respiratory failure with hypoxia (HCC)  Acute respiratory failure with hypoxia due to acute on chronic diastolic CHF (congestive heart failure) ([place]): lvef 50-55%, +grade II diastolic dysfunction on echo Initially was requiring 2 L of oxygen to keep saturations greater than 92%. His diuretics was recently discontinued due to an acute gout flare. Cardiac biomarkers have remained basically flat.   ddimer 0.49 He received iv lasix, now on oral lasix and coreg norvasc held on admission due to edema Cr hover around 2, monitor cr  Paroxysmal atrial fibrillation: Rate control with an AICD in place, paced rhytym continue Coreg and Eliquis  NSVT asymptomatic Continue coreg, keep K .4, check mag  History of CVA: Continue Lipitor, Plavix, eliquis  HTN:  continue coreg,  Started lasix bp better controlled after Increasing hydralazine Hold norvasc  due to lower extremity edema  Hypokalema, remain low, continue  replace k, repeat in am  Chronic kidney disease stage IIIa with right partial nephrectomy on 08/22/2019 Cr 1.4 on presentation, cr today is 2.02 Repeat bmp in am, Monitor renal function, renal dosing meds   Left foot pain Left foot x ray "1. No acute findings. 2. Tibiotalar joint, midfoot and first MTP joint osteoarthritis. 3. Prominent calcaneal spurs." He does has elevated lactic acid at 9.9,  no significant  Erythema , does has generalized foot tenderness, ankle slightly edematous Continue allopurinol, prednisone , add cholcicine, monitor cr  Pt eval   .Body mass index is 31.03 kg/m.   DVT Prophylaxis:elquis  Code Status: partial   Family Communication: patient and landlord  with permission  Disposition Plan:    Patient came from:                 home                                                                                         Anticipated d/c place: home with home health  Barriers to d/c OR conditions which need to be met to effect a safe d/c: left foot pain, not able to bear weight, monitor cr and bp control   Consultants:  none  Procedures:  none  Antibiotics:  none   Objective: BP 138/80 (BP Location: Right Arm)   Pulse 61   Temp (!) 97.4 F (36.3 C) (Oral)   Resp 17   Ht 6' 2"  (1.88 m)   Wt 109.6 kg   SpO2 97%   BMI 31.03 kg/m   Intake/Output Summary (Last 24 hours) at 04/03/2020 1150 Last data filed at 04/03/2020 0901 Gross per 24 hour  Intake 820 ml  Output 700 ml  Net 120 ml   Filed Weights   04/01/20 0558 04/02/20 0532 04/03/20 0343  Weight: 108.4 kg 109.5 kg 109.6 kg    Exam: Patient is examined daily including today on 04/03/2020, exams remain the same as of yesterday except that has changed    General:  NAD  Cardiovascular: paced rhythm  Respiratory: CTABL  Abdomen: Soft/ND/NT, positive BS  Musculoskeletal: left ankle trace  Edema  Neuro: alert,  oriented   Data Reviewed: Basic Metabolic Panel: Recent Labs  Lab 03/29/20 0413 03/30/20 0423 03/31/20 0508 04/02/20 0737 04/03/20 0514  NA 140 141 140 140 139  K 3.4* 3.5 3.2* 3.5 3.4*  CL 104 104 104 102 104  CO2 25 28 28 26 25   GLUCOSE 98 95 91 102* 92  BUN 16 21 22  34* 34*  CREATININE 1.69* 1.98* 1.90* 2.02* 1.97*  CALCIUM 8.6* 8.4* 8.3* 8.8* 8.8*   Liver Function Tests: No results for input(s): AST, ALT, ALKPHOS, BILITOT, PROT, ALBUMIN in the last 168 hours. No results for input(s): LIPASE, AMYLASE in the last 168 hours. No results for input(s): AMMONIA in the last 168 hours. CBC: Recent Labs  Lab 03/28/20 1235 03/29/20 0413 04/03/20 0514  WBC 4.5 4.2 4.4  NEUTROABS 3.7 3.4 3.2  HGB 13.2 11.5* 11.6*  HCT 40.9 35.5* 36.3*  MCV 88.3 87.9 87.5  PLT 127* 111* 134*   Cardiac Enzymes:   No results for input(s): CKTOTAL, CKMB, CKMBINDEX, TROPONINI in the last 168 hours. BNP (last 3 results) Recent Labs    03/07/20 0403 03/28/20 1235  BNP 106.1* 274.0*    ProBNP (last 3 results) No results for input(s): PROBNP in the last 8760 hours.  CBG: No results for input(s): GLUCAP in the last 168 hours.  Recent Results (from the past 240 hour(s))  SARS CORONAVIRUS 2 (TAT 6-24 HRS) Nasopharyngeal Nasopharyngeal Swab     Status: None   Collection Time: 03/28/20 12:34 PM   Specimen: Nasopharyngeal Swab  Result Value Ref Range Status   SARS Coronavirus 2 NEGATIVE NEGATIVE Final    Comment: (NOTE) SARS-CoV-2 target nucleic acids are NOT DETECTED. The SARS-CoV-2 RNA is generally detectable in upper and lower respiratory specimens during the acute phase of infection. Negative results do not preclude SARS-CoV-2 infection, do not rule out co-infections with other pathogens, and should not be used as the sole basis for treatment or other patient management decisions. Negative results must be combined with clinical observations, patient history, and epidemiological  information. The expected result is Negative. Fact Sheet for Patients: SugarRoll.be Fact Sheet for Healthcare Providers: https://www.woods-mathews.com/ This test is not yet approved or cleared by the [place] FDA and  has been authorized for detection and/or diagnosis of SARS-CoV-2 by FDA under an Emergency Use Authorization (EUA). This EUA will remain  in effect (meaning this test can be used) for the duration of the COVID-19 declaration under Section 56 4(b)(1) of the Act, 21 U.S.C. section 360bbb-3(b)(1), unless the authorization is terminated or revoked sooner. Performed at [facility]  [facility], [place] [address], [place], [place] [address]      Studies: No results found.  Scheduled Meds: . allopurinol  250 mg Oral Daily  . apixaban  5 mg Oral BID  . aspirin EC  81 mg Oral Daily  . atorvastatin  80 mg Oral q1800  . calcitRIOL  0.25 mcg Oral Daily  . carvedilol  25 mg Oral BID WC  . clopidogrel  75 mg Oral Daily  . colchicine  0.3 mg Oral Daily  . docusate sodium  100 mg Oral BID  . furosemide  20 mg Oral Daily  . hydrALAZINE  25 mg Oral TID  . ketotifen  1 drop Both Eyes BID  . pantoprazole  40 mg Oral Daily  . potassium chloride  40 mEq Oral Daily  . potassium chloride  40 mEq Oral Once  . predniSONE  50 mg Oral Q breakfast  . primidone  100 mg Oral BID  . sertraline  25 mg Oral QHS  . sodium chloride flush  3 mL Intravenous Q12H    Continuous Infusions: . sodium chloride       Time spent: 34mns I have personally reviewed and interpreted on  04/03/2020 daily labs, tele strips, imagings as discussed above under date review session and assessment and plans.  I reviewed all nursing notes, pharmacy notes,  vitals, pertinent old records  I have discussed plan of care as described above with RN , patient and friend with permission  on 04/03/2020   FFlorencia ReasonsMD, PhD, FACP  [facility]  Available via Epic secure chat  7am-7pm for nonurgent issues Please page for urgent issues, pager number available through [email or web address] .   04/03/2020, 11:50 AM  LOS: 6 days

## 2020-04-03 NOTE — TOC Initial Note (Signed)
Transition of Care [facility]) - Initial/Assessment Note    Patient Details  Name: Alexander Austin MRN: [id number] Date of Birth: 1946/01/01  [facility] CM/SW Contact:    Zenon Mayo, RN Phone Number: 04/03/2020, 12:14 PM  Clinical Narrative:                 NCM spoke with patient, he states he lives alone in an apartment, in the basement area of his landlord's home, he has an aide 5 days /week for 3 hrs who cleans, fixes his meals, does his laundry and helps him with bathing.  He states he also has an [facility] that helps him with medications. He said the RN is with [facility] for HHRN,HHPT, will need resumption orders.  He has 3 walkers, 2 rollators, and a scooter at home.  NCM wii check with [facility] to see if he is active.  He states he is a [place] patient and goes to the [place].  Expected Discharge Plan: [place] Barriers to Discharge: Continued Medical Work up   Patient Goals and CMS Choice Patient states their goals for this hospitalization and ongoing recovery are:: get better CMS Medicare.gov Compare Post Acute Care list provided to:: Patient Choice offered to / list presented to : Patient  Expected Discharge Plan and Services Expected Discharge Plan: [place]   Discharge Planning Services: CM Consult Post Acute Care Choice: Resumption of Svcs/PTA Provider Living arrangements for the past 2 months: Single Family Home                   DME Agency: NA       HH Arranged: RN, Disease Management, PT HH Agency: [place] Date [facility] Agency Contacted: 03/31/20 Time HH Agency Contacted: 18 Representative spoke with at [place]: [name]  Prior Living Arrangements/Services Living arrangements for the past 2 months: [place] with:: Self Patient language and need for interpreter reviewed:: Yes Do you feel safe going back to the place where you live?: Yes      Need for Family Participation  in Patient Care: Yes (Comment)   Current home services: DME, Home RN, Home PT(has 3 walkers, 2 rollators and a scooter, he also has home aide) Criminal Activity/Legal Involvement Pertinent to Current Situation/Hospitalization: No - Comment as needed  Activities of Daily Living Home Assistive Devices/Equipment: Grab bars in shower ADL Screening (condition at time of admission) Patient's cognitive ability adequate to safely complete daily activities?: Yes Is the patient deaf or have difficulty hearing?: No Does the patient have difficulty seeing, even when wearing glasses/contacts?: No Does the patient have difficulty concentrating, remembering, or making decisions?: No Patient able to express need for assistance with ADLs?: Yes Does the patient have difficulty dressing or bathing?: No Independently performs ADLs?: Yes (appropriate for developmental age) Does the patient have difficulty walking or climbing stairs?: Yes Weakness of Legs: Left Weakness of Arms/Hands: None  Permission Sought/Granted                  Emotional Assessment Appearance:: Appears stated age Attitude/Demeanor/Rapport: Engaged Affect (typically observed): Appropriate Orientation: : Oriented to  Time, Oriented to Situation, Oriented to Place, Oriented to Self Alcohol / Substance Use: Not Applicable Psych Involvement: No (comment)  Admission diagnosis:  Acute on chronic diastolic CHF (congestive heart failure) ([place]) [I50.33] Patient Active Problem List   Diagnosis Date Noted  . Acute respiratory failure with hypoxia ([place]) 03/31/2020  .  Acute on chronic systolic CHF (congestive heart failure) ([place]) 03/28/2020  . Acute gout 03/07/2020  . CKD (chronic kidney disease) stage 4, GFR 15-29 ml/min (HCC) 03/07/2020  . Obesity (BMI 30.0-34.9) 03/07/2020  . Tremor 04/16/2015  . BPH (benign prostatic hyperplasia) 04/16/2015  . Chronic anticoagulation 04/16/2015  . Edema 04/16/2015  . Urinary retention 03/06/2015   . 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 NSTEMI (non-ST elevated myocardial infarction) ([place]) 03/06/2015  . PAF (paroxysmal atrial fibrillation) ([place]) 03/06/2015  . History of renal cell cancer 03/06/2015  . Vitamin B12 deficiency 03/06/2015  . Major depressive disorder, recurrent, in remission ([place]) 03/06/2015  . History of stroke 03/06/2015  . GERD (gastroesophageal reflux disease) 03/06/2015  . Essential hypertension, benign 03/06/2015  . Chronic gout 03/06/2015   PCP:  Patient, No Pcp Per Pharmacy:   [id number] [place], [address] [address] Phone: [phone] Fax: [phone]     Social Determinants of Health (SDOH) Interventions    Readmission Risk Interventions Readmission Risk Prevention Plan 04/03/2020  Transportation Screening Complete  PCP or Specialist Appt within 3-5 Days Complete  HRI or [place] Complete  Social Work Consult for [place] Planning/Counseling Complete  Palliative Care Screening Not Applicable  Medication Review Press photographer) Complete  Some recent data might be hidden

## 2020-04-04 DIAGNOSIS — I480 Paroxysmal atrial fibrillation: Secondary | ICD-10-CM | POA: Diagnosis not present

## 2020-04-04 DIAGNOSIS — N1831 Chronic kidney disease, stage 3a: Secondary | ICD-10-CM

## 2020-04-04 DIAGNOSIS — J9601 Acute respiratory failure with hypoxia: Secondary | ICD-10-CM | POA: Diagnosis not present

## 2020-04-04 DIAGNOSIS — I5023 Acute on chronic systolic (congestive) heart failure: Secondary | ICD-10-CM | POA: Diagnosis not present

## 2020-04-04 DIAGNOSIS — Z951 Presence of aortocoronary bypass graft: Secondary | ICD-10-CM

## 2020-04-04 LAB — BASIC METABOLIC PANEL
Anion gap: 13 (ref 5–15)
BUN: 33 mg/dL — ABNORMAL HIGH (ref 8–23)
CO2: 20 mmol/L — ABNORMAL LOW (ref 22–32)
Calcium: 9.3 mg/dL (ref 8.9–10.3)
Chloride: 109 mmol/L (ref 98–111)
Creatinine, Ser: 1.92 mg/dL — ABNORMAL HIGH (ref 0.61–1.24)
GFR calc Af Amer: 39 mL/min — ABNORMAL LOW (ref >60)
GFR calc non Af Amer: 34 mL/min — ABNORMAL LOW (ref >60)
Glucose, Bld: 98 mg/dL (ref 70–99)
Potassium: 5 mmol/L (ref 3.5–5.1)
Sodium: 142 mmol/L (ref 135–145)

## 2020-04-04 LAB — TSH: TSH: 1.345 u[IU]/mL (ref 0.350–4.500)

## 2020-04-04 LAB — MAGNESIUM: Magnesium: 2.2 mg/dL (ref 1.7–2.4)

## 2020-04-04 MED ORDER — FUROSEMIDE 40 MG PO TABS: 40.0000 mg | ORAL_TABLET | Freq: Every day | ORAL | 0 refills | Status: DC

## 2020-04-04 MED ORDER — COLCHICINE 0.6 MG PO TABS: 0.3000 mg | ORAL_TABLET | Freq: Every day | ORAL | 0 refills | Status: DC

## 2020-04-04 MED ORDER — HYDRALAZINE HCL 50 MG PO TABS: 50.0000 mg | ORAL_TABLET | Freq: Three times a day (TID) | ORAL | Status: DC

## 2020-04-04 MED ORDER — SENNOSIDES-DOCUSATE SODIUM 8.6-50 MG PO TABS: 1.0000 | ORAL_TABLET | Freq: Every day | ORAL | 0 refills | Status: DC

## 2020-04-04 MED ORDER — PREDNISONE 10 MG PO TABS: ORAL_TABLET | ORAL | 0 refills | Status: DC

## 2020-04-04 MED ORDER — HYDRALAZINE HCL 50 MG PO TABS: 50.0000 mg | ORAL_TABLET | Freq: Three times a day (TID) | ORAL | 0 refills | Status: DC

## 2020-04-04 MED ORDER — FUROSEMIDE 40 MG PO TABS: 40.0000 mg | ORAL_TABLET | Freq: Every day | ORAL | Status: DC

## 2020-04-04 NOTE — Plan of Care (Signed)
  Problem: Nutrition: Goal: Adequate nutrition will be maintained Outcome: Completed/Met   Problem: Safety: Goal: Ability to remain free from injury will improve Outcome: Completed/Met   Problem: Skin Integrity: Goal: Risk for impaired skin integrity will decrease Outcome: Completed/Met   

## 2020-04-04 NOTE — Discharge Summary (Signed)
Discharge Summary  Alexander Austin FTD:[id number] DOB: Aug 24, 1946  PCP: Donald Prose, PA-C  Admit date: 03/28/2020 Discharge date: 04/04/2020  Time spent: 50mins, more than 50% time spent on coordination of care.  Recommendations for Outpatient Follow-up:  1. F/u with PCP within a week at [facility] clinic for hospital discharge follow up, repeat cbc/bmp at follow up 2. Follow-up with cardiology for heart failure management 3. Home health ordered  New medication at discharge Lasix 40 mg daily Colchicine 0.3 mg daily Prednisone taper  Increase hydralazine dose Stop Norvasc due to lower extremity edema  Discharge Diagnoses:  Active Hospital Problems   Diagnosis Date Noted  . Acute on chronic systolic CHF (congestive heart failure) ([place]) 03/28/2020  . Acute respiratory failure with hypoxia ([place]) 03/31/2020  . CKD (chronic kidney disease) stage 4, GFR 15-29 ml/min ([facility]) 03/07/2020  . Obesity (BMI 30.0-34.9) 03/07/2020  . Chronic gout 03/06/2015  . Essential hypertension, benign 03/06/2015  . PAF (paroxysmal atrial fibrillation) ([place]) 03/06/2015    Resolved Hospital Problems  No resolved problems to display.    Discharge Condition: stable  Diet recommendation: heart healthy  Filed Weights   04/02/20 0532 04/03/20 0343 04/04/20 0422  Weight: 109.5 kg 109.6 kg 110.2 kg    History of present illness: ( per admitting MD Dr Lorin Mercy) Chief Complaint: SOB  HPI: Alexander Austin is a 74 y.o. male with medical history significant of afib, on Eliquis; renal cell cancer, 9/12 s/p right partial nephrectomy; stage 4 CKD; CAD s/p CABG;  ischemic cardiomyopathy with an AICD; HTN; OSA; and CVA presenting with SOB.  He was last admitted from 3/20-22 for acute gout.  He reports that there was something wrong with his left knee, he couldn't put pressure on it.  He also couldn't breathe.  So he called 911.  He is not on O2 at home but he does have chronic SOB.  SOB started a couple of days  ago and has been worsening.  +cough, slightly worse than baseline.  Has L foot gout with chronic swelling.  +substernal CP this AM, resolved spontaneously.  No orthopnea.  +PND.    ED Course: Likely CHF - increasing SOB for a few days.  Not regularly on Lasix.  Some increased LE edema.  BNP mildly elevated.  Normal D-dimer.  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 His diuretics was recently discontinued due to an acute gout flare. Cardiac biomarkers have remained basically flat, he denies chest pain ddimer 0.49 He received iv lasix, now on oral lasix and coreg norvasc held on admission due to edema Cr hover around 2, monitor cr F/u with cardiology  Paroxysmal atrial fibrillation: Rate control with an AICD in place, paced rhytym continue Coreg and Eliquis  NSVT asymptomatic Continue coreg, keep K >4,  Mag>2 tsh 1.3   History of CVA: Continue Lipitor, Plavix, eliquis  HTN:  continue coreg,  Started lasix  Increased  hydralazine He is allergic to imdur, not able to use arb or acei due to ckd Hold norvasc due to lower extremity edema  Hypokalema,  replaced and normalized.  Chronic kidney disease stage IIIa with right partial nephrectomy on 08/22/2019 Cr 1.4 on presentation, cr today is 2.02 Repeat bmp in am, Monitor renal function, renal  dosing meds   Left foot pain, likely gout flare Left foot x ray "1. No acute findings. 2. Tibiotalar joint, midfoot and first MTP joint osteoarthritis. 3. Prominent calcaneal spurs." He does has elevated lactic acid at 9.9,  no significant  Erythema , does has generalized foot tenderness, ankle slightly edematous Treated with  allopurinol, prednisone  and cholcicine, monitor cr  Pt eval improved   .Body mass index is 31.03 kg/m.   DVT Prophylaxis:elquis  Code Status: partial   Family Communication: patient and landlord  with permission  Disposition Plan:    Patient came from:home  Anticipated d/c place:home with home health   Consultants:  none  Procedures:  none  Antibiotics:  none   Discharge Exam: BP (!) 184/87   Pulse (!) 51   Temp (!) 97.4 F (36.3 C) (Oral)   Resp 15   Ht 6\' 2"  (1.88 m)   Wt 110.2 kg   SpO2 100%   BMI 31.19 kg/m    General:  NAD  Cardiovascular: paced rhythm  Respiratory: CTABL  Abdomen: Soft/ND/NT, positive BS  Musculoskeletal: left ankle  Edema/pain almost resolved  Neuro: alert, oriented    Discharge Instructions You were cared for by a hospitalist during your hospital stay. If you have any questions about your discharge medications or the care you received while you were in the hospital after you are discharged, you can call the unit and asked to speak with the hospitalist on call if the hospitalist that took care of you is not available. Once you are discharged, your primary care physician will handle any further medical issues. Please note that NO REFILLS for any discharge medications will be authorized once you are discharged, as it is imperative that you return to your primary care physician (or establish a relationship with a primary care physician if you do not have one) for your aftercare needs  so that they can reassess your need for medications and monitor your lab values.  Discharge Instructions    Diet - low sodium heart healthy   Complete by: As directed    Discharge instructions   Complete by: As directed    Please check blood pressure at home 1-2 times a day bring record to your doctor at hospital discharge follow-up appointment.   Increase activity slowly   Complete by: As directed      Allergies as of 04/04/2020      Reactions   Isosorbide Dinitrate Other (See Comments), Anaphylaxis   Cardiovascular Arrest Cardiovascular Arrest Can take Sublingual Nitro   Isosorbide Nitrate Anaphylaxis   Other reaction(s): Cardiovascular Arrest (ALLERGY/intolerance) Cardiovascular Arrest Can take Sublingual Nitro. Takes sublingual nitroglycerin at home with no problems Cardiovascular Arrest Can take Sublingual Nitro.   Shellfish-derived Products Other (See Comments)   Patient states shellfish triggers his gout      Medication List    STOP taking these medications   amLODipine 10 MG tablet Commonly known as: NORVASC   DSS 100 MG Caps     TAKE these medications   allopurinol 100 MG tablet Commonly known as: ZYLOPRIM Take 250 mg by mouth daily.  apixaban 5 MG Tabs tablet Commonly known as: ELIQUIS Take by mouth 2 (two) times daily.   atorvastatin 80 MG tablet Commonly known as: LIPITOR Take by mouth daily at 6 PM.   calcitRIOL 0.25 MCG capsule Commonly known as: ROCALTROL Take 0.25 mcg by mouth daily.   carvedilol 25 MG tablet Commonly known as: COREG Take 25 mg by mouth in the morning and at bedtime.   clopidogrel 75 MG tablet Commonly known as: PLAVIX Take 75 mg by mouth daily.   colchicine 0.6 MG tablet Take 0.5 tablets (0.3 mg total) by mouth daily. Start taking on: April 05, 2020   ferrous sulfate 325 (65 FE) MG tablet Take 325 mg by mouth every Monday, Wednesday, and Friday.   furosemide 40 MG tablet Commonly known as: LASIX Take 1 tablet  (40 mg total) by mouth daily. Start taking on: April 05, 2020   hydrALAZINE 50 MG tablet Commonly known as: APRESOLINE Take 1 tablet (50 mg total) by mouth 3 (three) times daily. What changed:   medication strength  how much to take   ketotifen 0.025 % ophthalmic solution Commonly known as: ZADITOR Place 1 drop into both eyes 2 (two) times daily.   nitroGLYCERIN 0.4 MG SL tablet Commonly known as: NITROSTAT Place 0.4 mg under the tongue every 5 (five) minutes as needed for chest pain.   pantoprazole 40 MG tablet Commonly known as: PROTONIX Take 40 mg by mouth daily. Take before morning meds to protect stomach   predniSONE 10 MG tablet Commonly known as: DELTASONE Take 4 tablets on day one, 3 tablets on day two, 2 tablets on day three, 1 tablet on day four, then stop.   primidone 50 MG tablet Commonly known as: MYSOLINE Take 100 mg by mouth in the morning and at bedtime.   senna-docusate 8.6-50 MG tablet Commonly known as: Senokot-S Take 1 tablet by mouth at bedtime.   sertraline 50 MG tablet Commonly known as: ZOLOFT Take 25 mg by mouth at bedtime.   Vitamin D (Ergocalciferol) 1.25 MG (50000 UNIT) Caps capsule Commonly known as: DRISDOL Take 50,000 Units by mouth every 7 (seven) days. Sunday      Allergies  Allergen Reactions  . Isosorbide Dinitrate Other (See Comments) and Anaphylaxis    Cardiovascular Arrest Cardiovascular Arrest Can take Sublingual Nitro  . Isosorbide Nitrate Anaphylaxis    Other reaction(s): Cardiovascular Arrest (ALLERGY/intolerance) Cardiovascular Arrest Can take Sublingual Nitro. Takes sublingual nitroglycerin at home with no problems Cardiovascular Arrest Can take Sublingual Nitro.   Gard[employer] Products Other (See Comments)    Patient states shellfish triggers his gout   Follow-up Information    Donald Prose, PA-C On 04/17/2020.   Specialty: Cardiology Why: for hear failure management, cardiology to adjust lasix  dose please bring your blood pressure record for your doctor to review  @ 2:30 pm  Contact information: [address] [place] [address] [facility], [place] Follow up in 1 week(s).   Why: hospital discharge follow up, repeat cbc/bmp at follow up please bring your blood pressure record for your doctor to review Contact information: [place] [address] [phone]            The results of significant diagnostics from this hospitalization (including imaging, microbiology, ancillary and laboratory) are listed below for reference.    Significant Diagnostic Studies: DG Chest Port 1 View  Result Date: 03/28/2020 CLINICAL DATA:  Shortness  of breath since this morning. EXAM: PORTABLE CHEST 1 VIEW COMPARISON:  Radiographs 03/07/2020 and 05/10/2019. CT 08/13/2016. FINDINGS: 1243 hours. Mild patient rotation to the right. The heart size and mediastinal contours are stable status post median sternotomy and CABG. Left subclavian AICD leads are unchanged. The lungs are clear. There is no pleural effusion or pneumothorax. No acute osseous findings are evident. Telemetry leads overlie the chest. IMPRESSION: Stable postoperative chest. No active cardiopulmonary process. Electronically Signed   By: Richardean Sale M.D.   On: 03/28/2020 12:55   DG Chest Port 1 View  Result Date: 03/07/2020 CLINICAL DATA:  Shortness of breath, fatigue, gout in the left foot EXAM: PORTABLE CHEST 1 VIEW COMPARISON:  Radiograph May 10, 2019, January 26, 2019 FINDINGS: Stable appearance of the scattered calcified granulomata including a larger granuloma in the left lower lung measuring up to 1.4 cm in size. Some streaky opacities in the lung bases favor atelectasis given low volumes. No consolidation, features of edema, pneumothorax, or effusion. Cardiomegaly is similar to prior. Postsurgical changes related to prior CABG including intact and  aligned sternotomy wires and multiple surgical clips projecting over the mediastinum. Pacer/defibrillator pack overlies the left chest wall with leads at the right atrium, apex and coronary sinus. No acute osseous or soft tissue abnormality. IMPRESSION: 1. Low lung volumes with streaky opacities in the lung bases favoring atelectasis. 2. Stable cardiomegaly. 3. Stable appearance of calcified granulomata. Electronically Signed   By: Lovena Le M.D.   On: 03/07/2020 03:48   DG Foot 2 Views Left  Result Date: 04/02/2020 CLINICAL DATA:  Left foot pain with weight-bearing for over 1 year. No injury. EXAM: LEFT FOOT - 2 VIEW COMPARISON:  03/08/2020 and 10/15/2018. FINDINGS: Minimal degenerative change in the first metatarsophalangeal joint. Remote trauma involving the base of the fifth proximal phalanx and possibly base of the fifth metatarsal, unchanged from 03/08/2020 and likely also 10/15/2018. Midfoot degenerative changes. Additional degenerative changes in the tibiotalar joint. Prominent calcaneal spurs. IMPRESSION: 1. No acute findings. 2. Tibiotalar joint, midfoot and first MTP joint osteoarthritis. 3. Prominent calcaneal spurs. Electronically Signed   By: Lorin Picket M.D.   On: 04/02/2020 10:41   DG Foot 2 Views Left  Result Date: 03/08/2020 CLINICAL DATA:  Acute left foot pain. EXAM: LEFT FOOT - 2 VIEW COMPARISON:  None. FINDINGS: There is no evidence of fracture or dislocation. There is no evidence of arthropathy. Moderate posterior calcaneal spurring is noted. Soft tissues are unremarkable. IMPRESSION: Moderate posterior calcaneal spurring. No acute abnormality seen in the left foot. Electronically Signed   By: Marijo Conception M.D.   On: 03/08/2020 12:53   ECHOCARDIOGRAM COMPLETE  Result Date: 03/29/2020    ECHOCARDIOGRAM REPORT   Patient Name:   Alexander Austin Date of Exam: 03/29/2020 Medical Rec #:  [id number]    Height:       74.0 in Accession #:    [id number]   Weight:       244.5 lb Date of  Birth:  25-Mar-1946    BSA:          2.367 m Patient Age:    47 years     BP:           154/64 mmHg Patient Gender: M            HR:           65 bpm. Exam Location:  Inpatient Procedure: 2D Echo, Cardiac Doppler and Color Doppler Indications:  I50.23 Acute on chronic systolic (congestive) heart failure  History:        Patient has no prior history of Echocardiogram examinations.                 Previous Myocardial Infarction, Pacemaker, Stroke,                 Arrythmias:Atrial Fibrillation, Signs/Symptoms:Dyspnea; Risk                 Factors:Hypertension, Dyslipidemia, Sleep Apnea and GERD. CKD.  Sonographer:    Jonelle Sidle Dance Referring Phys: [place]  1. Left ventricular ejection fraction, by estimation, is 50 to 55%. The left ventricle has low normal function. Left ventricular endocardial border not optimally defined to evaluate regional wall motion. There is mild concentric left ventricular hypertrophy. Left ventricular diastolic parameters are consistent with Grade II diastolic dysfunction (pseudonormalization). Elevated left ventricular end-diastolic pressure.  2. Right ventricular systolic function is normal. The right ventricular size is normal. There is normal pulmonary artery systolic pressure.  3. Left atrial size was moderately dilated.  4. The mitral valve is normal in structure. Trivial mitral valve regurgitation. No evidence of mitral stenosis.  5. The aortic valve is normal in structure. Aortic valve regurgitation is mild. No aortic stenosis is present.  6. The inferior vena cava is normal in size with greater than 50% respiratory variability, suggesting right atrial pressure of 3 mmHg. FINDINGS  Left Ventricle: Left ventricular ejection fraction, by estimation, is 50 to 55%. The left ventricle has low normal function. Left ventricular endocardial border not optimally defined to evaluate regional wall motion. The left ventricular internal cavity  size was normal in size.  There is mild concentric left ventricular hypertrophy. Left ventricular diastolic parameters are consistent with Grade II diastolic dysfunction (pseudonormalization). Elevated left ventricular end-diastolic pressure. Right Ventricle: The right ventricular size is normal. No increase in right ventricular wall thickness. Right ventricular systolic function is normal. There is normal pulmonary artery systolic pressure. The tricuspid regurgitant velocity is 2.06 m/s, and  with an assumed right atrial pressure of 3 mmHg, the estimated right ventricular systolic pressure is 29.5 mmHg. Left Atrium: Left atrial size was moderately dilated. Right Atrium: Right atrial size was normal in size. Pericardium: There is no evidence of pericardial effusion. Mitral Valve: The mitral valve is normal in structure. Normal mobility of the mitral valve leaflets. Trivial mitral valve regurgitation. No evidence of mitral valve stenosis. Tricuspid Valve: The tricuspid valve is normal in structure. Tricuspid valve regurgitation is not demonstrated. No evidence of tricuspid stenosis. Aortic Valve: The aortic valve is normal in structure. Aortic valve regurgitation is mild. Aortic regurgitation PHT measures 546 msec. No aortic stenosis is present. Pulmonic Valve: The pulmonic valve was normal in structure. Pulmonic valve regurgitation is trivial. No evidence of pulmonic stenosis. Aorta: The aortic root is normal in size and structure. Venous: The inferior vena cava is normal in size with greater than 50% respiratory variability, suggesting right atrial pressure of 3 mmHg. IAS/Shunts: No atrial level shunt detected by color flow Doppler.  LEFT VENTRICLE PLAX 2D LVIDd:         5.06 cm  Diastology LVIDs:         3.96 cm  LV e' lateral:   5.98 cm/s LV PW:         1.28 cm  LV E/e' lateral: 14.8 LV IVS:        1.26 cm  LV e' medial:    4.90  cm/s LVOT diam:     2.10 cm  LV E/e' medial:  18.1 LV SV:         80 LV SV Index:   34 LVOT Area:     3.46 cm   RIGHT VENTRICLE            IVC RV Basal diam:  2.92 cm    IVC diam: 1.64 cm RV S prime:     5.98 cm/s TAPSE (M-mode): 2.2 cm LEFT ATRIUM              Index       RIGHT ATRIUM           Index LA diam:        4.00 cm  1.69 cm/m  RA Area:     17.80 cm LA Vol (A2C):   89.7 ml  37.89 ml/m RA Volume:   48.10 ml  20.32 ml/m LA Vol (A4C):   101.0 ml 42.67 ml/m LA Biplane Vol: 102.0 ml 43.09 ml/m  AORTIC VALVE LVOT Vmax:   111.00 cm/s LVOT Vmean:  69.000 cm/s LVOT VTI:    0.232 m AI PHT:      546 msec  AORTA Ao Root diam: 4.00 cm Ao Asc diam:  3.50 cm MITRAL VALVE               TRICUSPID VALVE MV Area (PHT): 3.12 cm    TR Peak grad:   17.0 mmHg MV Decel Time: 243 msec    TR Vmax:        206.00 cm/s MV E velocity: 88.80 cm/s MV A velocity: 75.70 cm/s  SHUNTS MV E/A ratio:  1.17        Systemic VTI:  0.23 m                            Systemic Diam: 2.10 cm Fransico Him MD Electronically signed by Fransico Him MD Signature Date/Time: 03/29/2020/1:05:18 PM    Final     Microbiology: Recent Results (from the past 240 hour(s))  SARS CORONAVIRUS 2 (TAT 6-24 HRS) Nasopharyngeal Nasopharyngeal Swab     Status: None   Collection Time: 03/28/20 12:34 PM   Specimen: Nasopharyngeal Swab  Result Value Ref Range Status   SARS Coronavirus 2 NEGATIVE NEGATIVE Final    Comment: (NOTE) SARS-CoV-2 target nucleic acids are NOT DETECTED. The SARS-CoV-2 RNA is generally detectable in upper and lower respiratory specimens during the acute phase of infection. Negative results do not preclude SARS-CoV-2 infection, do not rule out co-infections with other pathogens, and should not be used as the sole basis for treatment or other patient management decisions. Negative results must be combined with clinical observations, patient history, and epidemiological information. The expected result is Negative. Fact Sheet for Patients: SugarRoll.be Fact Sheet for Healthcare  Providers: https://www.woods-mathews.com/ This test is not yet approved or cleared by the [place] FDA and  has been authorized for detection and/or diagnosis of SARS-CoV-2 by FDA under an Emergency Use Authorization (EUA). This EUA will remain  in effect (meaning this test can be used) for the duration of the COVID-19 declaration under Section 56 4(b)(1) of the Act, 21 U.S.C. section 360bbb-3(b)(1), unless the authorization is terminated or revoked sooner. Performed at [facility], [place] [address] [place], [place] [address]      Labs: Basic Metabolic Panel: Recent Labs  Lab 03/30/20 0423 03/31/20 8127 04/02/20 5170 04/03/20 0174 04/04/20 9449  NA 141 140 140 139 142  K 3.5 3.2* 3.5 3.4* 5.0  CL 104 104 102 104 109  CO2 28 28 26 25  20*  GLUCOSE 95 91 102* 92 98  BUN 21 22 34* 34* 33*  CREATININE 1.98* 1.90* 2.02* 1.97* 1.92*  CALCIUM 8.4* 8.3* 8.8* 8.8* 9.3  MG  --   --   --   --  2.2   Liver Function Tests: No results for input(s): AST, ALT, ALKPHOS, BILITOT, PROT, ALBUMIN in the last 168 hours. No results for input(s): LIPASE, AMYLASE in the last 168 hours. No results for input(s): AMMONIA in the last 168 hours. CBC: Recent Labs  Lab 03/28/20 1235 03/29/20 0413 04/03/20 0514  WBC 4.5 4.2 4.4  NEUTROABS 3.7 3.4 3.2  HGB 13.2 11.5* 11.6*  HCT 40.9 35.5* 36.3*  MCV 88.3 87.9 87.5  PLT 127* 111* 134*   Cardiac Enzymes: No results for input(s): CKTOTAL, CKMB, CKMBINDEX, TROPONINI in the last 168 hours. BNP: BNP (last 3 results) Recent Labs    03/07/20 0403 03/28/20 1235  BNP 106.1* 274.0*    ProBNP (last 3 results) No results for input(s): PROBNP in the last 8760 hours.  CBG: No results for input(s): GLUCAP in the last 168 hours.     Signed:  Florencia Reasons MD, PhD, FACP  [facility] 04/04/2020, 9:42 AM

## 2020-04-04 NOTE — Progress Notes (Signed)
Physical Therapy Treatment Patient Details Name: Alexander Austin MRN: [id number] DOB: 1946-02-28 Today's Date: 04/04/2020    History of Present Illness 74 y.o. male significant for atrial fibrillation on Eliquis, renal cell carcinoma, status post right partial nephrectomy on 08/22/2019, chronic kidney disease stage III ischemic cardiomyopathy with an AICD obstructive sleep apnea CVA comes in with progressively worsening shortness of breath cough and substernal chest pain. Admitted 03/28/20 for Acute respiratory failure with hypoxia due to acute on chronic diastolic CHF and acute gout flare in L LE    PT Comments    Pt supine in bed on entry, agreeable to therapy today. Pt reports he had a rough night as he had an accident and that staff took an hour to get to him to clean him up.  Pt is mod I to come to EoB, and min guard for standing with RW. Once in standing request to use BSC, able to stand for pericare with support of RW. Afterward pt able to ambulate 250 feet in hallway with RW, pt with 2/4 DoE however quickly recovers once seated in recliner. Pt is eager to discharge home today.    Follow Up Recommendations  Home health PT;Supervision - Intermittent     Equipment Recommendations  None recommended by PT       Precautions / Restrictions Precautions Precautions: Fall Restrictions Weight Bearing Restrictions: No    Mobility  Bed Mobility Overal bed mobility: Modified Independent             General bed mobility comments: use of bed rail for support  Transfers Overall transfer level: Needs assistance Equipment used: Rolling walker (2 wheeled) Transfers: Sit to/from Stand Sit to Stand: Min guard         General transfer comment: min guard for safety, good power up and steadying, able to steady for pericare  Ambulation/Gait Ambulation/Gait assistance: Supervision Gait Distance (Feet): 250 Feet Assistive device: Rolling walker (2 wheeled) Gait Pattern/deviations:  Shuffle;Wide base of support Gait velocity: slowed Gait velocity interpretation: 1.31 - 2.62 ft/sec, indicative of limited community ambulator General Gait Details: supervision for safety, strong, steady gait          Balance Overall balance assessment: Needs assistance Sitting-balance support: Feet unsupported;Bilateral upper extremity supported Sitting balance-Leahy Scale: Good     Standing balance support: No upper extremity supported;During functional activity;Single extremity supported Standing balance-Leahy Scale: Fair Standing balance comment: able to static stand without support                            Cognition Arousal/Alertness: Awake/alert Behavior During Therapy: WFL for tasks assessed/performed Overall Cognitive Status: Within Functional Limits for tasks assessed                                           General Comments General comments (skin integrity, edema, etc.): VSS on RA      Pertinent Vitals/Pain Pain Assessment: No/denies pain           PT Goals (current goals can now be found in the care plan section) Acute Rehab PT Goals Patient Stated Goal: improve his mobility  PT Goal Formulation: With patient Time For Goal Achievement: 04/15/20 Potential to Achieve Goals: Good Progress towards PT goals: Progressing toward goals    Frequency    Min 3X/week      PT Plan  Current plan remains appropriate       AM-PAC PT "6 Clicks" Mobility   Outcome Measure  Help needed turning from your back to your side while in a flat bed without using bedrails?: None Help needed moving from lying on your back to sitting on the side of a flat bed without using bedrails?: A Little Help needed moving to and from a bed to a chair (including a wheelchair)?: None Help needed standing up from a chair using your arms (e.g., wheelchair or bedside chair)?: None Help needed to walk in hospital room?: A Little Help needed climbing 3-5  steps with a railing? : A Little 6 Click Score: 21    End of Session Equipment Utilized During Treatment: Gait belt Activity Tolerance: Patient tolerated treatment well Patient left: in chair;with call bell/phone within reach Nurse Communication: Mobility status PT Visit Diagnosis: Muscle weakness (generalized) (M62.81);Other abnormalities of gait and mobility (R26.89);Difficulty in walking, not elsewhere classified (R26.2)     Time: 0931-1216 PT Time Calculation (min) (ACUTE ONLY): 25 min  Charges:  $Gait Training: 8-22 mins $Therapeutic Activity: 8-22 mins                     Nandika Stetzer B. Migdalia Dk PT, DPT Acute Rehabilitation Services Pager [phone] Office [phone]    [place] 04/04/2020, 1:26 PM

## 2020-04-04 NOTE — TOC Transition Note (Signed)
Transition of Care [facility]) - CM/SW Discharge Note   Patient Details  Name: Alexander Austin MRN: [id number] Date of Birth: January 12, 1946  Transition of Care [facility]) CM/SW Contact:  Zenon Mayo, RN Phone Number: 04/04/2020, 10:16 AM   Clinical Narrative:    Patient is for dc today, NCM notified [name] with [facility].     Final next level of care: [place] Barriers to Discharge: No Barriers Identified   Patient Goals and CMS Choice Patient states their goals for this hospitalization and ongoing recovery are:: get better CMS Medicare.gov Compare Post Acute Care list provided to:: Patient Choice offered to / list presented to : Patient  Discharge Placement                       Discharge Plan and Services   Discharge Planning Services: CM Consult Post Acute Care Choice: Resumption of Svcs/PTA Provider            DME Agency: NA       HH Arranged: RN, Disease Management, PT [place] Agency: [place] Date [place]: 03/31/20 Time HH Agency Contacted: 1400 Representative spoke with at [place]: [place] ([place]) Interventions     Readmission Risk Interventions Readmission Risk Prevention Plan 04/03/2020  Transportation Screening Complete  PCP or Specialist Appt within 3-5 Days Complete  HRI or [place] Complete  Social Work Consult for [place] Planning/Counseling Complete  Palliative Care Screening Not Applicable  Medication Review Press photographer) Complete  Some recent data might be hidden

## 2020-05-21 ENCOUNTER — Emergency Department (HOSPITAL_COMMUNITY): Payer: No Typology Code available for payment source

## 2020-05-21 ENCOUNTER — Encounter (HOSPITAL_COMMUNITY): Payer: Self-pay | Admitting: Emergency Medicine

## 2020-05-21 ENCOUNTER — Inpatient Hospital Stay (HOSPITAL_COMMUNITY)
Admission: EM | Admit: 2020-05-21 | Discharge: 2020-05-26 | DRG: 554 | Disposition: A | Discharge status: Home Health | Payer: No Typology Code available for payment source | Attending: Internal Medicine | Admitting: Internal Medicine

## 2020-05-21 DIAGNOSIS — I480 Paroxysmal atrial fibrillation: Secondary | ICD-10-CM | POA: Diagnosis present

## 2020-05-21 DIAGNOSIS — Z95810 Presence of automatic (implantable) cardiac defibrillator: Secondary | ICD-10-CM | POA: Diagnosis not present

## 2020-05-21 DIAGNOSIS — Z6830 Body mass index (BMI) 30.0-30.9, adult: Secondary | ICD-10-CM | POA: Diagnosis not present

## 2020-05-21 DIAGNOSIS — K219 Gastro-esophageal reflux disease without esophagitis: Secondary | ICD-10-CM | POA: Diagnosis present

## 2020-05-21 DIAGNOSIS — Z951 Presence of aortocoronary bypass graft: Secondary | ICD-10-CM

## 2020-05-21 DIAGNOSIS — Z79899 Other long term (current) drug therapy: Secondary | ICD-10-CM

## 2020-05-21 DIAGNOSIS — E669 Obesity, unspecified: Secondary | ICD-10-CM | POA: Diagnosis present

## 2020-05-21 DIAGNOSIS — Z8249 Family history of ischemic heart disease and other diseases of the circulatory system: Secondary | ICD-10-CM

## 2020-05-21 DIAGNOSIS — Z955 Presence of coronary angioplasty implant and graft: Secondary | ICD-10-CM | POA: Diagnosis not present

## 2020-05-21 DIAGNOSIS — Z7901 Long term (current) use of anticoagulants: Secondary | ICD-10-CM

## 2020-05-21 DIAGNOSIS — Z91013 Allergy to seafood: Secondary | ICD-10-CM

## 2020-05-21 DIAGNOSIS — I252 Old myocardial infarction: Secondary | ICD-10-CM | POA: Diagnosis not present

## 2020-05-21 DIAGNOSIS — F419 Anxiety disorder, unspecified: Secondary | ICD-10-CM | POA: Diagnosis present

## 2020-05-21 DIAGNOSIS — I25810 Atherosclerosis of coronary artery bypass graft(s) without angina pectoris: Secondary | ICD-10-CM | POA: Diagnosis present

## 2020-05-21 DIAGNOSIS — F329 Major depressive disorder, single episode, unspecified: Secondary | ICD-10-CM | POA: Diagnosis present

## 2020-05-21 DIAGNOSIS — G4733 Obstructive sleep apnea (adult) (pediatric): Secondary | ICD-10-CM | POA: Diagnosis present

## 2020-05-21 DIAGNOSIS — N400 Enlarged prostate without lower urinary tract symptoms: Secondary | ICD-10-CM | POA: Diagnosis present

## 2020-05-21 DIAGNOSIS — Z20822 Contact with and (suspected) exposure to covid-19: Secondary | ICD-10-CM | POA: Diagnosis present

## 2020-05-21 DIAGNOSIS — M1009 Idiopathic gout, multiple sites: Secondary | ICD-10-CM | POA: Diagnosis present

## 2020-05-21 DIAGNOSIS — M109 Gout, unspecified: Secondary | ICD-10-CM | POA: Diagnosis present

## 2020-05-21 DIAGNOSIS — I5042 Chronic combined systolic (congestive) and diastolic (congestive) heart failure: Secondary | ICD-10-CM | POA: Diagnosis present

## 2020-05-21 DIAGNOSIS — M7989 Other specified soft tissue disorders: Secondary | ICD-10-CM | POA: Diagnosis not present

## 2020-05-21 DIAGNOSIS — N184 Chronic kidney disease, stage 4 (severe): Secondary | ICD-10-CM | POA: Diagnosis present

## 2020-05-21 DIAGNOSIS — Z8673 Personal history of transient ischemic attack (TIA), and cerebral infarction without residual deficits: Secondary | ICD-10-CM | POA: Diagnosis not present

## 2020-05-21 DIAGNOSIS — I130 Hypertensive heart and chronic kidney disease with heart failure and stage 1 through stage 4 chronic kidney disease, or unspecified chronic kidney disease: Secondary | ICD-10-CM | POA: Diagnosis present

## 2020-05-21 DIAGNOSIS — Z87891 Personal history of nicotine dependence: Secondary | ICD-10-CM

## 2020-05-21 DIAGNOSIS — E876 Hypokalemia: Secondary | ICD-10-CM | POA: Diagnosis present

## 2020-05-21 DIAGNOSIS — Z85528 Personal history of other malignant neoplasm of kidney: Secondary | ICD-10-CM

## 2020-05-21 DIAGNOSIS — M79673 Pain in unspecified foot: Secondary | ICD-10-CM

## 2020-05-21 DIAGNOSIS — R251 Tremor, unspecified: Secondary | ICD-10-CM | POA: Diagnosis present

## 2020-05-21 DIAGNOSIS — E785 Hyperlipidemia, unspecified: Secondary | ICD-10-CM | POA: Diagnosis present

## 2020-05-21 DIAGNOSIS — Z888 Allergy status to other drugs, medicaments and biological substances status: Secondary | ICD-10-CM

## 2020-05-21 DIAGNOSIS — Z905 Acquired absence of kidney: Secondary | ICD-10-CM

## 2020-05-21 DIAGNOSIS — Z7902 Long term (current) use of antithrombotics/antiplatelets: Secondary | ICD-10-CM

## 2020-05-21 DIAGNOSIS — I255 Ischemic cardiomyopathy: Secondary | ICD-10-CM | POA: Diagnosis present

## 2020-05-21 LAB — COMPREHENSIVE METABOLIC PANEL
ALT: 17 U/L (ref 0–44)
AST: 15 U/L (ref 15–41)
Albumin: 3 g/dL — ABNORMAL LOW (ref 3.5–5.0)
Alkaline Phosphatase: 79 U/L (ref 38–126)
Anion gap: 13 (ref 5–15)
BUN: 17 mg/dL (ref 8–23)
CO2: 24 mmol/L (ref 22–32)
Calcium: 8.5 mg/dL — ABNORMAL LOW (ref 8.9–10.3)
Chloride: 104 mmol/L (ref 98–111)
Creatinine, Ser: 1.64 mg/dL — ABNORMAL HIGH (ref 0.61–1.24)
GFR calc Af Amer: 47 mL/min — ABNORMAL LOW (ref >60)
GFR calc non Af Amer: 41 mL/min — ABNORMAL LOW (ref >60)
Glucose, Bld: 106 mg/dL — ABNORMAL HIGH (ref 70–99)
Potassium: 2.6 mmol/L — CL (ref 3.5–5.1)
Sodium: 141 mmol/L (ref 135–145)
Total Bilirubin: 1.4 mg/dL — ABNORMAL HIGH (ref 0.3–1.2)
Total Protein: 5.8 g/dL — ABNORMAL LOW (ref 6.5–8.1)

## 2020-05-21 LAB — CBC
HCT: 35.1 % — ABNORMAL LOW (ref 39.0–52.0)
Hemoglobin: 11.3 g/dL — ABNORMAL LOW (ref 13.0–17.0)
MCH: 28.5 pg (ref 26.0–34.0)
MCHC: 32.2 g/dL (ref 30.0–36.0)
MCV: 88.4 fL (ref 80.0–100.0)
Platelets: 151 10*3/uL (ref 150–400)
RBC: 3.97 MIL/uL — ABNORMAL LOW (ref 4.22–5.81)
RDW: 15.8 % — ABNORMAL HIGH (ref 11.5–15.5)
WBC: 6.2 10*3/uL (ref 4.0–10.5)
nRBC: 0 % (ref 0.0–0.2)

## 2020-05-21 LAB — LACTIC ACID, PLASMA: Lactic Acid, Venous: 1.3 mmol/L (ref 0.5–1.9)

## 2020-05-21 LAB — SARS CORONAVIRUS 2 BY RT PCR (HOSPITAL ORDER, PERFORMED IN ~~LOC~~ HOSPITAL LAB): SARS Coronavirus 2: NEGATIVE

## 2020-05-21 LAB — BRAIN NATRIURETIC PEPTIDE: B Natriuretic Peptide: 198.6 pg/mL — ABNORMAL HIGH (ref 0.0–100.0)

## 2020-05-21 LAB — POTASSIUM: Potassium: 3.6 mmol/L (ref 3.5–5.1)

## 2020-05-21 LAB — CK: Total CK: 28 U/L — ABNORMAL LOW (ref 49–397)

## 2020-05-21 MED ORDER — HYDROMORPHONE HCL 1 MG/ML IJ SOLN
1.0000 mg | Freq: Once | INTRAMUSCULAR | Status: AC
  Administered 2020-05-21: 1 mg via INTRAVENOUS
  Filled 2020-05-21: qty 1

## 2020-05-21 MED ORDER — APIXABAN 5 MG PO TABS
5.0000 mg | ORAL_TABLET | Freq: Two times a day (BID) | ORAL | Status: DC
  Administered 2020-05-21 – 2020-05-26 (×10): 5 mg via ORAL
  Filled 2020-05-21 (×11): qty 1

## 2020-05-21 MED ORDER — KETOTIFEN FUMARATE 0.025 % OP SOLN
1.0000 [drp] | Freq: Two times a day (BID) | OPHTHALMIC | Status: DC
  Administered 2020-05-21 – 2020-05-26 (×10): 1 [drp] via OPHTHALMIC
  Filled 2020-05-21: qty 5

## 2020-05-21 MED ORDER — POTASSIUM CHLORIDE CRYS ER 20 MEQ PO TBCR
40.0000 meq | EXTENDED_RELEASE_TABLET | Freq: Once | ORAL | Status: AC
  Administered 2020-05-21: 40 meq via ORAL
  Filled 2020-05-21: qty 2

## 2020-05-21 MED ORDER — AMLODIPINE BESYLATE 5 MG PO TABS: 5.0000 mg | ORAL_TABLET | Freq: Every day | ORAL | Status: DC

## 2020-05-21 MED ORDER — FERROUS SULFATE 325 (65 FE) MG PO TABS
325.0000 mg | ORAL_TABLET | ORAL | Status: DC
  Administered 2020-05-21 – 2020-05-26 (×3): 325 mg via ORAL
  Filled 2020-05-21 (×3): qty 1

## 2020-05-21 MED ORDER — POTASSIUM CHLORIDE 10 MEQ/100ML IV SOLN
10.0000 meq | INTRAVENOUS | Status: AC
  Administered 2020-05-21 (×3): 10 meq via INTRAVENOUS
  Filled 2020-05-21 (×2): qty 100

## 2020-05-21 MED ORDER — HYDROMORPHONE HCL 1 MG/ML IJ SOLN: 0.5000 mg | INTRAMUSCULAR | Status: DC | PRN

## 2020-05-21 MED ORDER — PREDNISONE 20 MG PO TABS: 40.0000 mg | ORAL_TABLET | Freq: Every day | ORAL | Status: DC

## 2020-05-21 MED ORDER — SERTRALINE HCL 50 MG PO TABS
25.0000 mg | ORAL_TABLET | Freq: Every day | ORAL | Status: DC
  Administered 2020-05-22 – 2020-05-25 (×4): 25 mg via ORAL
  Filled 2020-05-21 (×4): qty 1

## 2020-05-21 MED ORDER — FUROSEMIDE 40 MG PO TABS
40.0000 mg | ORAL_TABLET | Freq: Every day | ORAL | Status: DC
  Administered 2020-05-22 – 2020-05-26 (×5): 40 mg via ORAL
  Filled 2020-05-21 (×6): qty 1

## 2020-05-21 MED ORDER — OXYCODONE HCL 5 MG PO TABS
5.0000 mg | ORAL_TABLET | Freq: Four times a day (QID) | ORAL | Status: DC | PRN
  Administered 2020-05-21 – 2020-05-23 (×3): 5 mg via ORAL
  Filled 2020-05-21 (×3): qty 1

## 2020-05-21 MED ORDER — PANTOPRAZOLE SODIUM 40 MG PO TBEC
40.0000 mg | DELAYED_RELEASE_TABLET | Freq: Every day | ORAL | Status: DC
  Administered 2020-05-21 – 2020-05-26 (×6): 40 mg via ORAL
  Filled 2020-05-21 (×6): qty 1

## 2020-05-21 MED ORDER — CARVEDILOL 25 MG PO TABS
25.0000 mg | ORAL_TABLET | Freq: Two times a day (BID) | ORAL | Status: DC
  Administered 2020-05-21 – 2020-05-26 (×10): 25 mg via ORAL
  Filled 2020-05-21 (×10): qty 1

## 2020-05-21 MED ORDER — ACETAMINOPHEN 325 MG PO TABS: 650.0000 mg | ORAL_TABLET | Freq: Four times a day (QID) | ORAL | Status: DC | PRN

## 2020-05-21 MED ORDER — MAGNESIUM OXIDE 400 (241.3 MG) MG PO TABS
800.0000 mg | ORAL_TABLET | Freq: Once | ORAL | Status: AC
  Administered 2020-05-21: 800 mg via ORAL
  Filled 2020-05-21: qty 2

## 2020-05-21 MED ORDER — PRIMIDONE 50 MG PO TABS
100.0000 mg | ORAL_TABLET | Freq: Two times a day (BID) | ORAL | Status: DC
  Administered 2020-05-21 – 2020-05-26 (×10): 100 mg via ORAL
  Filled 2020-05-21 (×11): qty 2

## 2020-05-21 MED ORDER — CALCITRIOL 0.25 MCG PO CAPS
0.2500 ug | ORAL_CAPSULE | Freq: Every day | ORAL | Status: DC
  Administered 2020-05-22 – 2020-05-26 (×5): 0.25 ug via ORAL
  Filled 2020-05-21 (×6): qty 1

## 2020-05-21 MED ORDER — GUAIFENESIN 100 MG/5ML PO SOLN
5.0000 mL | ORAL | Status: DC | PRN
  Administered 2020-05-24: 100 mg via ORAL
  Filled 2020-05-21 (×2): qty 5

## 2020-05-21 MED ORDER — BENZONATATE 100 MG PO CAPS
200.0000 mg | ORAL_CAPSULE | Freq: Three times a day (TID) | ORAL | Status: DC | PRN
  Administered 2020-05-21 – 2020-05-22 (×3): 200 mg via ORAL
  Filled 2020-05-21 (×3): qty 2

## 2020-05-21 MED ORDER — VITAMIN D (ERGOCALCIFEROL) 1.25 MG (50000 UNIT) PO CAPS
50000.0000 [IU] | ORAL_CAPSULE | ORAL | Status: DC
  Administered 2020-05-25: 50000 [IU] via ORAL
  Filled 2020-05-21: qty 1

## 2020-05-21 MED ORDER — ATORVASTATIN CALCIUM 80 MG PO TABS
80.0000 mg | ORAL_TABLET | Freq: Every day | ORAL | Status: DC
  Administered 2020-05-22 – 2020-05-25 (×4): 80 mg via ORAL
  Filled 2020-05-21 (×4): qty 1

## 2020-05-21 MED ORDER — HYDRALAZINE HCL 50 MG PO TABS
75.0000 mg | ORAL_TABLET | Freq: Three times a day (TID) | ORAL | Status: DC
  Administered 2020-05-21 – 2020-05-26 (×15): 75 mg via ORAL
  Filled 2020-05-21 (×13): qty 1
  Filled 2020-05-21: qty 3
  Filled 2020-05-21: qty 1

## 2020-05-21 MED ORDER — ACETAMINOPHEN 650 MG RE SUPP: 650.0000 mg | Freq: Four times a day (QID) | RECTAL | Status: DC | PRN

## 2020-05-21 MED ORDER — HYDRALAZINE HCL 25 MG PO TABS: 50.0000 mg | ORAL_TABLET | Freq: Three times a day (TID) | ORAL | Status: DC

## 2020-05-21 MED ORDER — COLCHICINE 0.6 MG PO TABS
0.6000 mg | ORAL_TABLET | Freq: Two times a day (BID) | ORAL | Status: DC
  Administered 2020-05-21 – 2020-05-26 (×10): 0.6 mg via ORAL
  Filled 2020-05-21 (×11): qty 1

## 2020-05-21 MED ORDER — CLOPIDOGREL BISULFATE 75 MG PO TABS
75.0000 mg | ORAL_TABLET | Freq: Every day | ORAL | Status: DC
  Administered 2020-05-21 – 2020-05-26 (×6): 75 mg via ORAL
  Filled 2020-05-21 (×6): qty 1

## 2020-05-21 MED ORDER — SENNOSIDES-DOCUSATE SODIUM 8.6-50 MG PO TABS
1.0000 | ORAL_TABLET | Freq: Every day | ORAL | Status: DC
  Administered 2020-05-21 – 2020-05-25 (×5): 1 via ORAL
  Filled 2020-05-21 (×6): qty 1

## 2020-05-21 NOTE — Progress Notes (Signed)
Patient decided to wear CPAP. RT set up CPAP and placed on patient with 2L O2 bled into circuit. Patient is tolerating CPAP settings well at this time. No respiratory distress noted. RT will monitor as needed.

## 2020-05-21 NOTE — Progress Notes (Signed)
Bilateral lower extremity venous duplex completed. Refer to "CV Proc" under chart review to view preliminary results.  05/21/2020 1:28 PM Kelby Aline., MHA, RVT, RDCS, RDMS

## 2020-05-21 NOTE — ED Triage Notes (Signed)
Pt arrives to ED with c/o of bilateral knee pain and leg swelling. Pt states in April was diagnosed with CHF and Gout.

## 2020-05-21 NOTE — H&P (Addendum)
History and Physical    [name]:[id number] DOB: 10-04-1946 DOA: 05/21/2020  PCP: Donald Prose, PA-C (Confirm with patient/family/NH records and if not entered, this has to be entered at [facility] point of entry) Patient coming from: Home  I have personally briefly reviewed patient's old medical records in [place]  Chief Complaint: multiple joints pain  HPI: Alexander Austin is a 74 y.o. male with medical history significant of afib, on Eliquis; renal cell cancer, 9/12 s/p right partial nephrectomy; stage 4 CKD; gout, CAD s/p CABG; chronic combined systolic and diastolic CHF,  ischemic cardiomyopathy with an AICD; HTN; OSA; and CVA presenting with multiple joint pain and swelling.    Symptoms started 2 days ago, initially was left knee then soon progressed to right knee bilateral ankles right big toe and both feet.  Unable to stand on his feet because of severe pain involving bilateral knees.  Denies any fever chills.  Try to take some OTC without any effect.   ED Course: Of right lower extremity knee ankle foot did not show significant bony changes.  Review of Systems: As per HPI otherwise 10 point review of systems negative.    Past Medical History:  Diagnosis Date  . A-fib ([place])   . Anxiety   . Benign prostate hyperplasia   . Cancer ([place])    Kidney  . Chronic kidney disease   . Congestive heart failure (CHF) ([place])   . CVA (cerebral infarction)   . Depression   . Dyspnea   . GERD (gastroesophageal reflux disease)   . Gout   . Hematospermia 01/13/2012  . History of myocardial infarction 1993  . Hyperlipidemia   . Hypertension   . Sleep apnea   . Stroke ([place])   . Vitamin B 12 deficiency   . Weakness of left leg 03/28/2020    Past Surgical History:  Procedure Laterality Date  . ANGIOPLASTY     X 8   . BACK SURGERY    . 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  . SAPHENOUS VEIN GRAFT RESECTION  02/05/15     reports that he quit smoking about 37 years ago. He  has never used smokeless tobacco. He reports that he does not drink alcohol or use drugs.  Allergies  Allergen Reactions  . Isosorbide Dinitrate Other (See Comments) and Anaphylaxis    Cardiovascular Arrest Cardiovascular Arrest Can take Sublingual Nitro  . Isosorbide Nitrate Anaphylaxis    Other reaction(s): Cardiovascular Arrest (ALLERGY/intolerance) Cardiovascular Arrest Can take Sublingual Nitro. Takes sublingual nitroglycerin at home with no problems Cardiovascular Arrest Can take Sublingual Nitro.   . 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    Family History  Problem Relation Age of Onset  . Hypertension Mother   . Heart disease Mother   . Stroke Mother   . Hypertension Father     Prior to Admission medications   Medication Sig Start Date End Date Taking? Authorizing Provider  allopurinol (ZYLOPRIM) 100 MG tablet Take 250 mg by mouth daily.    Yes [provider]  atorvastatin (LIPITOR) 80 MG tablet Take by mouth daily at 6 PM.    Yes [provider]  calcitRIOL (ROCALTROL) 0.25 MCG capsule Take 0.25 mcg by mouth daily.    Yes [provider]  carvedilol (COREG) 25 MG tablet Take 25 mg by mouth in the morning and at bedtime.  05/05/15  Yes [provider]  colchicine 0.6  MG tablet Take 0.5 tablets (0.3 mg total) by mouth daily. 04/05/20  Yes Florencia Reasons, MD  furosemide (LASIX) 40 MG tablet Take 1 tablet (40 mg total) by mouth daily. 04/05/20  Yes Florencia Reasons, MD  hydrALAZINE (APRESOLINE) 50 MG tablet Take 1 tablet (50 mg total) by mouth 3 (three) times daily. 04/04/20  Yes Florencia Reasons, MD  nitroGLYCERIN (NITROSTAT) 0.4 MG SL tablet Place 0.4 mg under the tongue every 5 (five) minutes as needed for chest pain.    Yes [provider]  sertraline (ZOLOFT) 50 MG tablet Take 25 mg by mouth at bedtime.    Yes [provider]  apixaban (ELIQUIS) 5 MG TABS tablet Take by mouth 2 (two)  times daily.     [provider]  clopidogrel (PLAVIX) 75 MG tablet Take 75 mg by mouth daily.     [provider]  ferrous sulfate 325 (65 FE) MG tablet Take 325 mg by mouth every Monday, Wednesday, and Friday.  10/18/18   [provider]  ketotifen (ZADITOR) 0.025 % ophthalmic solution Place 1 drop into both eyes 2 (two) times daily.     [provider]  pantoprazole (PROTONIX) 40 MG tablet Take 40 mg by mouth daily. Take before morning meds to protect stomach    [provider]  predniSONE (DELTASONE) 10 MG tablet Take 4 tablets on day one, 3 tablets on day two, 2 tablets on day three, 1 tablet on day four, then stop. 04/04/20   Florencia Reasons, MD  primidone (MYSOLINE) 50 MG tablet Take 100 mg by mouth in the morning and at bedtime.     [provider]  senna-docusate (SENOKOT-S) 8.6-50 MG tablet Take 1 tablet by mouth at bedtime. 04/04/20   Florencia Reasons, MD  Vitamin D, Ergocalciferol, (DRISDOL) 1.25 MG (50000 UNIT) CAPS capsule Take 50,000 Units by mouth every 7 (seven) days. Sunday    [provider]    Physical Exam: Vitals:   05/21/20 1230 05/21/20 1245 05/21/20 1300 05/21/20 1315  BP: (!) 144/53 (!) 152/78 (!) 159/71 (!) 151/52  Pulse: 71 66 68 73  Resp: (!) 24 (!) 21 (!) 29 (!) 29  Temp:      TempSrc:      SpO2: 96% 96% 98% 96%  Weight:      Height:        Constitutional: NAD, calm, comfortable Vitals:   05/21/20 1230 05/21/20 1245 05/21/20 1300 05/21/20 1315  BP: (!) 144/53 (!) 152/78 (!) 159/71 (!) 151/52  Pulse: 71 66 68 73  Resp: (!) 24 (!) 21 (!) 29 (!) 29  Temp:      TempSrc:      SpO2: 96% 96% 98% 96%  Weight:      Height:       Eyes: PERRL, lids and conjunctivae normal ENMT: Mucous membranes are moist. Posterior pharynx clear of any exudate or lesions.Normal dentition.  Neck: normal, supple, no masses, no thyromegaly Respiratory: clear to auscultation bilaterally, no wheezing, scattered crackles on B/L bases.  Normal respiratory effort. No accessory muscle use.  Cardiovascular: Regular rate and rhythm, no murmurs / rubs / gallops. 1+ extremity edema. 2+ pedal pulses. No carotid bruits.  Abdomen: no tenderness, no masses palpated. No hepatosplenomegaly. Bowel sounds positive.  Musculoskeletal: no clubbing / cyanosis.  Severe tenderness and decreased range of motion of bilateral knees, swelling involving bilateral feet and right big toe with warmth to touch Skin: no rashes, lesions, ulcers. No induration Neurologic: CN 2-12 grossly  intact. Sensation intact, DTR normal. Strength 5/5 in all 4.  Psychiatric: Normal judgment and insight. Alert and oriented x 3. Normal mood.     Labs on Admission: I have personally reviewed following labs and imaging studies  CBC: Recent Labs  Lab 05/21/20 1135  WBC 6.2  HGB 11.3*  HCT 35.1*  MCV 88.4  PLT 342   Basic Metabolic Panel: Recent Labs  Lab 05/21/20 1135  NA 141  K 2.6*  CL 104  CO2 24  GLUCOSE 106*  BUN 17  CREATININE 1.64*  CALCIUM 8.5*   GFR: Estimated Creatinine Clearance: 51.5 mL/min (A) (by C-G formula based on SCr of 1.64 mg/dL (H)). Liver Function Tests: Recent Labs  Lab 05/21/20 1135  AST 15  ALT 17  ALKPHOS 79  BILITOT 1.4*  PROT 5.8*  ALBUMIN 3.0*   No results for input(s): LIPASE, AMYLASE in the last 168 hours. No results for input(s): AMMONIA in the last 168 hours. Coagulation Profile: No results for input(s): INR, PROTIME in the last 168 hours. Cardiac Enzymes: Recent Labs  Lab 05/21/20 1135  CKTOTAL 28*   BNP (last 3 results) No results for input(s): PROBNP in the last 8760 hours. HbA1C: No results for input(s): HGBA1C in the last 72 hours. CBG: No results for input(s): GLUCAP in the last 168 hours. Lipid Profile: No results for input(s): CHOL, HDL, LDLCALC, TRIG, CHOLHDL, LDLDIRECT in the last 72 hours. Thyroid Function Tests: No results for input(s): TSH, T4TOTAL, FREET4, T3FREE, THYROIDAB in the last  72 hours. Anemia Panel: No results for input(s): VITAMINB12, FOLATE, FERRITIN, TIBC, IRON, RETICCTPCT in the last 72 hours. Urine analysis:    Component Value Date/Time   COLORURINE YELLOW 03/07/2020 0845   APPEARANCEUR CLEAR 03/07/2020 0845   LABSPEC 1.025 03/07/2020 0845   PHURINE 5.5 03/07/2020 0845   GLUCOSEU NEGATIVE 03/07/2020 0845   HGBUR SMALL (A) 03/07/2020 0845   BILIRUBINUR NEGATIVE 03/07/2020 0845   KETONESUR NEGATIVE 03/07/2020 0845   PROTEINUR NEGATIVE 03/07/2020 0845   NITRITE NEGATIVE 03/07/2020 0845   LEUKOCYTESUR NEGATIVE 03/07/2020 0845    Radiological Exams on Admission: DG Chest 1 View  Result Date: 05/21/2020 CLINICAL DATA:  Pain, no trauma EXAM: CHEST  1 VIEW COMPARISON:  03/28/2020 FINDINGS: Cardiomegaly status post median sternotomy and CABG with left chest multi lead pacer defibrillator. Both lungs are clear. Large, densely calcified nodule of the left upper lobe. The visualized skeletal structures are unremarkable. IMPRESSION: Cardiomegaly without acute abnormality of the lungs in AP portable projection. Electronically Signed   By: Eddie Candle M.D.   On: 05/21/2020 13:08   DG Tibia/Fibula Right  Result Date: 05/21/2020 CLINICAL DATA:  Right leg pain. EXAM: RIGHT TIBIA AND FIBULA - 2 VIEW COMPARISON:  None. FINDINGS: The knee and ankle joints are maintained. Moderate degenerative changes. The tibia and fibula are intact. No stress fracture or bone lesion. Surgical changes noted from vein harvesting. IMPRESSION: No acute bony findings. Electronically Signed   By: Marijo Sanes M.D.   On: 05/21/2020 13:11   DG Knee Complete 4 Views Right  Result Date: 05/21/2020 CLINICAL DATA:  Right knee pain. EXAM: RIGHT KNEE - COMPLETE 4+ VIEW COMPARISON:  None. FINDINGS: Mild tricompartmental degenerative changes but no acute bony findings, osteochondral lesion or chondrocalcinosis. No definite joint effusion. IMPRESSION: Mild tricompartmental degenerative changes but no acute  bony findings or joint effusion. Electronically Signed   By: Marijo Sanes M.D.   On: 05/21/2020 13:10   DG Foot 2 Views Right  Result Date: 05/21/2020 CLINICAL DATA:  Right foot pain. EXAM: RIGHT FOOT - 2 VIEW COMPARISON:  None. FINDINGS: Moderate degenerative changes but no acute bony findings or erosions. Mild pes cavus. IMPRESSION: Moderate degenerative changes but no acute bony findings. Electronically Signed   By: Marijo Sanes M.D.   On: 05/21/2020 13:09   VAS [place] LOWER EXTREMITY VENOUS (DVT) (ONLY [facility] & [facility])  Result Date: 05/21/2020  Lower Venous DVTStudy Indications: Swelling.  Comparison Study: No prior study Performing Technologist: Maudry Mayhew MHA, RDMS, RVT, RDCS  Examination Guidelines: A complete evaluation includes B-mode imaging, spectral Doppler, color Doppler, and power Doppler as needed of all accessible portions of each vessel. Bilateral testing is considered an integral part of a complete examination. Limited examinations for reoccurring indications may be performed as noted. The reflux portion of the exam is performed with the patient in reverse Trendelenburg.  +---------+---------------+---------+-----------+----------+--------------+ RIGHT    CompressibilityPhasicitySpontaneityPropertiesThrombus Aging +---------+---------------+---------+-----------+----------+--------------+ CFV      Full           Yes      Yes                                 +---------+---------------+---------+-----------+----------+--------------+ FV Prox  Full                                                        +---------+---------------+---------+-----------+----------+--------------+ FV Mid   Full                                                        +---------+---------------+---------+-----------+----------+--------------+ FV DistalFull                                                        +---------+---------------+---------+-----------+----------+--------------+  PFV      Full                                                        +---------+---------------+---------+-----------+----------+--------------+ POP      Full           Yes      Yes                                 +---------+---------------+---------+-----------+----------+--------------+ PTV      Full                                                        +---------+---------------+---------+-----------+----------+--------------+ PERO     Full                                                        +---------+---------------+---------+-----------+----------+--------------+  Right Technical Findings: Not visualized segments include SFJ.  +---------+---------------+---------+-----------+----------+--------------+ LEFT     CompressibilityPhasicitySpontaneityPropertiesThrombus Aging +---------+---------------+---------+-----------+----------+--------------+ CFV      Full           Yes      Yes                                 +---------+---------------+---------+-----------+----------+--------------+ FV Prox  Full                                                        +---------+---------------+---------+-----------+----------+--------------+ FV Mid   Full                                                        +---------+---------------+---------+-----------+----------+--------------+ FV DistalFull                                                        +---------+---------------+---------+-----------+----------+--------------+ POP      Full           Yes      Yes                                 +---------+---------------+---------+-----------+----------+--------------+ PTV      Full                                                        +---------+---------------+---------+-----------+----------+--------------+ PERO     Full                                                         +---------+---------------+---------+-----------+----------+--------------+   Left Technical Findings: Not visualized segments include SFJ, PFV.   Summary: RIGHT: - There is no evidence of deep vein thrombosis in the lower extremity. However, portions of this examination were limited- see technologist comments above.  - No cystic structure found in the popliteal fossa.  LEFT: - There is no evidence of deep vein thrombosis in the lower extremity. However, portions of this examination were limited- see technologist comments above.  - No cystic structure found in the popliteal fossa.  *See table(s) above for measurements and observations.    Preliminary     EKG: Independently reviewed.  Paced rhythm  Assessment/Plan Active Problems:   Gout flare  Acute gouty flareup with ambulation dysfunction -failed outpatient regimen of allopurinol and colchicine as outpatient -Start prednisone 40 mg daily, start taper once acute flareup improved - will likely need a higher dose of Allopurinol to prevent further attacks -Increase colchicine to 0.6 mg twice daily, since  flareup started more than 48-hour -Pain control -Consult dietitian for recommendation of gout prevention  Hypokalemia -IV and p.o. replacement, recheck level tonight  Hypomagnesium -P.o. replacement  History of combined systolic and diastolic CHF -Clinically looks like mild fluid overload, will continue p.o. Lasix -BP high, increase hydralazine dose  CKD 4-  History of renal cell cancer -Continue p.o. Lasix   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 with 4 stents  Essential hypertension, benign -Blood pressure somewhat high, cont BP meds, increase hydralazine dosage -Continue Plavix and Lipitor   PAF (paroxysmal atrial fibrillation)  -cont Coreg and Eliquis   Major depressive disorder  - Zoloft  GERD - cont PPI  Tremor - cont Mysoline   Obesity, Class III, BMI 40-49.9 (morbid obesity)  ([place]) -Patient on a diet and lost about 20 pounds in 3 months   DVT prophylaxis: Eliquis  Code Status: Do not intubate, allow chest compression  Family Communication:   Disposition Plan: from home- will need a PT eval before sending home  Consults called: none  Admission status:  MedSurg admission   Lequita Halt MD [facility] Pager [phone]   05/21/2020, 2:44 PM

## 2020-05-21 NOTE — ED Provider Notes (Signed)
[facility] Emergency Department Provider Note MRN:  [id number]  Arrival date & time: 05/21/20     Chief Complaint   Gout and Leg Swelling   History of Present Illness   Alexander Austin is a 74 y.o. year-old male with a history of CHF, gout presenting to the ED with chief complaint of leg pain.  Worsening leg pain and swelling over the past one or 2 days. Cannot walk because of the pain. Thinks it is another gout flare. Pain worse on the right leg but both legs hurt, severe, sharp, worse with any motion. Cough but no chest pain or shortness of breath, denies fever, headache or vision change, no abdominal pain.  Review of Systems  A complete 10 system review of systems was obtained and all systems are negative except as noted in the HPI and PMH.   Patient's Health History    Past Medical History:  Diagnosis Date  . A-fib ([place])   . Anxiety   . Benign prostate hyperplasia   . Cancer ([place])    Kidney  . Chronic kidney disease   . Congestive heart failure (CHF) ([place])   . CVA (cerebral infarction)   . Depression   . Dyspnea   . GERD (gastroesophageal reflux disease)   . Gout   . Hematospermia 01/13/2012  . History of myocardial infarction 1993  . Hyperlipidemia   . Hypertension   . Sleep apnea   . Stroke ([place])   . Vitamin B 12 deficiency   . Weakness of left leg 03/28/2020    Past Surgical History:  Procedure Laterality Date  . ANGIOPLASTY     X 8   . BACK SURGERY    . 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  . SAPHENOUS VEIN GRAFT RESECTION  02/05/15    Family History  Problem Relation Age of Onset  . Hypertension Mother   . Heart disease Mother   . Stroke Mother   . Hypertension Father     Social History   Socioeconomic History  . Marital status: Married    Spouse name: Not on file  . Number of children: Not on file  . Years of education: Not on file  . Highest education level: Not on file  Occupational History  . Occupation: retired   Tobacco Use  . Smoking status: Former Smoker    Quit date: 07/24/1982    Years since quitting: 37.8  . Smokeless tobacco: Never Used  Substance and Sexual Activity  . Alcohol use: No    Alcohol/week: 0.0 standard drinks  . Drug use: No  . Sexual activity: Not Currently  Other Topics Concern  . Not on file  Social History Narrative  . Not on file   Social Determinants of Health   Financial Resource Strain:   . Difficulty of Paying Living Expenses:   Food Insecurity:   . Worried About Charity fundraiser in the Last Year:   . Arboriculturist in the Last Year:   Transportation Needs:   . Film/video editor (Medical):   [name] Lack of Transportation (Non-Medical):   Physical Activity:   . Days of Exercise per Week:   . Minutes of Exercise per Session:   Stress:   . Feeling of Stress :   Social Connections:   . Frequency of Communication with Friends and Family:   . Frequency of Social Gatherings with Friends and Family:   . Attends Religious Services:   . Active Member  of Clubs or Organizations:   . Attends Archivist Meetings:   [name] Marital Status:   Intimate Partner Violence:   . Fear of Current or Ex-Partner:   . Emotionally Abused:   [name] Physically Abused:   . Sexually Abused:      Physical Exam   Vitals:   05/21/20 1052 05/21/20 1115  BP: (!) 155/85 (!) 171/68  Pulse: 78 66  Resp: (!) 40 (!) 33  Temp:    SpO2: 97% 93%    CONSTITUTIONAL: Chronically ill-appearing, NAD NEURO:  Alert and oriented x 3, no focal deficits EYES:  eyes equal and reactive ENT/NECK:  no LAD, no JVD CARDIO: Regular rate, well-perfused, normal S1 and S2 PULM:  CTAB no wheezing or rhonchi GI/GU:  normal bowel sounds, non-distended, non-tender MSK/SPINE: Exquisite tenderness to palpation to the right MCPs, right midfoot, right ankle, right knee, mild tenderness to palpation to the same areas of the left leg, scant edema bilaterally, slightly worse on the right SKIN:  no rash,  atraumatic PSYCH:  Appropriate speech and behavior  *Additional and/or pertinent findings included in MDM below  Diagnostic and Interventional Summary    EKG Interpretation  Date/Time:  Wednesday May 21 2020 11:32:38 EDT Ventricular Rate:  77 PR Interval:    QRS Duration: 149 QT Interval:  449 QTC Calculation: 509 R Axis:   -28 Text Interpretation: Sinus rhythm Short PR interval Nonspecific intraventricular conduction delay Probable inferior infarct, old Anteroseptal infarct, age indeterminate Confirmed by Gerlene Fee [phone]) on 05/21/2020 11:37:06 AM      Labs Reviewed  BRAIN NATRIURETIC PEPTIDE - Abnormal; Notable for the following components:      Result Value   B Natriuretic Peptide 198.6 (*)    All other components within normal limits  CBC - Abnormal; Notable for the following components:   RBC 3.97 (*)    Hemoglobin 11.3 (*)    HCT 35.1 (*)    RDW 15.8 (*)    All other components within normal limits  COMPREHENSIVE METABOLIC PANEL - Abnormal; Notable for the following components:   Potassium 2.6 (*)    Glucose, Bld 106 (*)    Creatinine, Ser 1.64 (*)    Calcium 8.5 (*)    Total Protein 5.8 (*)    Albumin 3.0 (*)    Total Bilirubin 1.4 (*)    GFR calc non Af Amer 41 (*)    GFR calc Af Amer 47 (*)    All other components within normal limits  CK - Abnormal; Notable for the following components:   Total CK 28 (*)    All other components within normal limits  SARS CORONAVIRUS 2 BY RT PCR (HOSPITAL ORDER, [facility])  CULTURE, BLOOD (SINGLE)  LACTIC ACID, PLASMA    VAS [place] LOWER EXTREMITY VENOUS (DVT) (ONLY [facility] & [facility])      DG Foot 2 Views Right  Final Result    DG Tibia/Fibula Right  Final Result    DG Knee Complete 4 Views Right  Final Result    DG Chest 1 View  Final Result      Medications  potassium chloride 10 mEq in 100 mL IVPB (10 mEq Intravenous New Bag/Given 05/21/20 1305)  HYDROmorphone (DILAUDID) injection 1 mg (1  mg Intravenous Given 05/21/20 1133)  magnesium oxide (MAG-OX) tablet 800 mg (800 mg Oral Given 05/21/20 1307)  potassium chloride SA (KLOR-CON) CR tablet 40 mEq (40 mEq Oral Given 05/21/20 1307)  Procedures  /  Critical Care .Critical Care Performed by: Maudie Flakes, MD Authorized by: Maudie Flakes, MD   Critical care provider statement:    Critical care time (minutes):  35   Critical care was necessary to treat or prevent imminent or life-threatening deterioration of the following conditions: Hypokalemia.   Critical care was time spent personally by me on the following activities:  Discussions with consultants, evaluation of patient's response to treatment, examination of patient, ordering and performing treatments and interventions, ordering and review of laboratory studies, ordering and review of radiographic studies, pulse oximetry, re-evaluation of patient's condition, obtaining history from patient or surrogate and review of old charts    ED Course and Medical Decision Making  I have reviewed the triage vital signs, the nursing notes, and pertinent available records from the EMR.  Listed above are laboratory and imaging tests that I personally ordered, reviewed, and interpreted and then considered in my medical decision making (see below for details).      Gout versus CHF. Patient had a recent admission in April with a similar presentation. Also considering DVT, work-up pending.  Work-up revealing significant hypokalemia, suspect related to diuretics.  Continues to have significant leg pain and inability to ambulate.  Will admit to hospitalist service.  Barth Kirks. Sedonia Small, MD [place] [email or web address]  Final Clinical Impressions(s) / ED Diagnoses     ICD-10-CM   1. Hypokalemia  E87.6   2. Foot pain  M79.673 DG Chest 1 View    DG Chest 1 View  3. Gout, arthritis  M10.9     ED Discharge Orders    None        Discharge Instructions Discussed with and Provided to Patient:   Discharge Instructions   None       Maudie Flakes, MD 05/21/20 1400

## 2020-05-21 NOTE — Progress Notes (Signed)
Patient refused CPAP for tonight. RT instructed patient to have RT called if he changes his mind. RT will monitor as needed. 

## 2020-05-22 ENCOUNTER — Encounter (HOSPITAL_COMMUNITY): Payer: Self-pay | Admitting: Internal Medicine

## 2020-05-22 ENCOUNTER — Other Ambulatory Visit: Payer: Self-pay

## 2020-05-22 DIAGNOSIS — E876 Hypokalemia: Secondary | ICD-10-CM

## 2020-05-22 DIAGNOSIS — M109 Gout, unspecified: Secondary | ICD-10-CM

## 2020-05-22 LAB — BASIC METABOLIC PANEL
Anion gap: 9 (ref 5–15)
BUN: 21 mg/dL (ref 8–23)
CO2: 24 mmol/L (ref 22–32)
Calcium: 8.1 mg/dL — ABNORMAL LOW (ref 8.9–10.3)
Chloride: 106 mmol/L (ref 98–111)
Creatinine, Ser: 1.67 mg/dL — ABNORMAL HIGH (ref 0.61–1.24)
GFR calc Af Amer: 46 mL/min — ABNORMAL LOW (ref >60)
GFR calc non Af Amer: 40 mL/min — ABNORMAL LOW (ref >60)
Glucose, Bld: 111 mg/dL — ABNORMAL HIGH (ref 70–99)
Potassium: 3.1 mmol/L — ABNORMAL LOW (ref 3.5–5.1)
Sodium: 139 mmol/L (ref 135–145)

## 2020-05-22 MED ORDER — POTASSIUM CHLORIDE CRYS ER 20 MEQ PO TBCR
40.0000 meq | EXTENDED_RELEASE_TABLET | Freq: Once | ORAL | Status: AC
  Administered 2020-05-22: 40 meq via ORAL
  Filled 2020-05-22: qty 2

## 2020-05-22 MED ORDER — PREDNISONE 20 MG PO TABS
40.0000 mg | ORAL_TABLET | Freq: Every day | ORAL | Status: DC
  Administered 2020-05-22 – 2020-05-25 (×4): 40 mg via ORAL
  Filled 2020-05-22 (×4): qty 2

## 2020-05-22 MED ORDER — ORAL CARE MOUTH RINSE
15.0000 mL | Freq: Two times a day (BID) | OROMUCOSAL | Status: DC
  Administered 2020-05-22 – 2020-05-25 (×8): 15 mL via OROMUCOSAL

## 2020-05-22 NOTE — Progress Notes (Signed)
Progress Note    Alexander Austin  HER:[id number] DOB: 1946-11-01  DOA: 05/21/2020 PCP: Donald Prose, PA-C    Brief Narrative:     Medical records reviewed and are as summarized below:  Alexander Austin is an 74 y.o. male with medical history significant of afib, on Eliquis;renal cell cancer, 9/12 s/p right partial nephrectomy; stage 4 CKD; gout, CAD s/p CABG; chronic combined systolic and diastolic CHF,ischemic cardiomyopathy with an AICD;HTN;OSA; andCVApresenting with multiple joint pain and swelling.   Symptoms started 2 days ago, initially was left knee then soon progressed to right knee bilateral ankles right big toe and both feet.  Unable to stand on his feet because of severe pain involving bilateral knees.  Denies any fever chills.  Try to take some OTC without any effect.     Assessment/Plan:   Active Problems:   Gout flare   Acute gouty flareup with ambulation dysfunction -multiple joints -failed outpatient regimen of allopurinol and colchicineas outpatient -Start prednisone 40 mg daily on 6/3- monitor for improvement -Increase colchicine to 0.6 mg twice daily, since flareup started more than 48-hour -Pain control -Consult dietitian for recommendation of gout prevention  Hypokalemia -replace  Hypomagnesium -P.o. replacement  History of combined systolic and diastolic CHF -continue p.o. Lasix -BP high, increase hydralazine dose  CKD 4-History of renal cell cancer -Continue p.o. Lasix  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 with 4 stentsEssential hypertension, benign -Blood pressure somewhat high, cont BP meds, increase hydralazine dosage -ContinuePlavix and Lipitor  PAF (paroxysmal atrial fibrillation) -cont Coreg and Eliquis  Major depressive disorder  - Zoloft  GERD - cont PPI  Tremor - cont Mysoline  Obesity Body mass index is 30.3 kg/m.   Family Communication/Anticipated D/C  date and plan/Code Status   DVT prophylaxis: Lovenox ordered. Code Status: partial  Disposition Plan: Status is: Inpatient  Remains inpatient appropriate because:Unsafe d/c plan   Dispo: The patient is from: Home              Anticipated d/c is to: Home              Anticipated d/c date is: 2 days              Patient currently is not medically stable to d/c.          Medical Consultants:    None.     Subjective:   Still with severe pain in lower ext- knees/ankles  Objective:    Vitals:   05/21/20 2252 05/21/20 2308 05/22/20 0546 05/22/20 0754  BP:  (!) 152/63 (!) 153/66 (!) 150/65  Pulse: 70 63 75 72  Resp: 16 20 20 18   Temp:  98.6 F (37 C) 99.5 F (37.5 C) 99.7 F (37.6 C)  TempSrc:  Oral Oral Oral  SpO2: 96% 98% 96% 97%  Weight:      Height:        Intake/Output Summary (Last 24 hours) at 05/22/2020 1135 Last data filed at 05/21/2020 1628 Gross per 24 hour  Intake 300 ml  Output --  Net 300 ml   Filed Weights   05/21/20 1009  Weight: 107 kg    Exam:  General: Appearance:    Obese male in no acute distress  Eyes:    PERRL, conjunctiva/corneas clear, EOM's intact       Lungs:     Clear to auscultation bilaterally, respirations unlabored  Heart:    Normal heart rate. Irregularly irregular  rhythm. No murmurs, rubs, or gallops.   MS:   All extremities are intact.   Neurologic:   Awake, alert, oriented x 3. No apparent focal neurological           defect.     Data Reviewed:   I have personally reviewed following labs and imaging studies:  Labs: Labs show the following:   Basic Metabolic Panel: Recent Labs  Lab 05/21/20 1135 05/21/20 1135 05/21/20 2000 05/22/20 0454  NA 141  --   --  139  K 2.6*   < > 3.6 3.1*  CL 104  --   --  106  CO2 24  --   --  24  GLUCOSE 106*  --   --  111*  BUN 17  --   --  21  CREATININE 1.64*  --   --  1.67*  CALCIUM 8.5*  --   --  8.1*   < > = values in this interval not displayed.   GFR Estimated  Creatinine Clearance: 50.6 mL/min (A) (by C-G formula based on SCr of 1.67 mg/dL (H)). Liver Function Tests: Recent Labs  Lab 05/21/20 1135  AST 15  ALT 17  ALKPHOS 79  BILITOT 1.4*  PROT 5.8*  ALBUMIN 3.0*   No results for input(s): LIPASE, AMYLASE in the last 168 hours. No results for input(s): AMMONIA in the last 168 hours. Coagulation profile No results for input(s): INR, PROTIME in the last 168 hours.  CBC: Recent Labs  Lab 05/21/20 1135  WBC 6.2  HGB 11.3*  HCT 35.1*  MCV 88.4  PLT 151   Cardiac Enzymes: Recent Labs  Lab 05/21/20 1135  CKTOTAL 28*   BNP (last 3 results) No results for input(s): PROBNP in the last 8760 hours. CBG: No results for input(s): GLUCAP in the last 168 hours. D-Dimer: No results for input(s): DDIMER in the last 72 hours. Hgb A1c: No results for input(s): HGBA1C in the last 72 hours. Lipid Profile: No results for input(s): CHOL, HDL, LDLCALC, TRIG, CHOLHDL, LDLDIRECT in the last 72 hours. Thyroid function studies: No results for input(s): TSH, T4TOTAL, T3FREE, THYROIDAB in the last 72 hours.  Invalid input(s): FREET3 Anemia work up: No results for input(s): VITAMINB12, FOLATE, FERRITIN, TIBC, IRON, RETICCTPCT in the last 72 hours. Sepsis Labs: Recent Labs  Lab 05/21/20 1135  WBC 6.2  LATICACIDVEN 1.3    Microbiology Recent Results (from the past 240 hour(s))  Culture, blood (single) w Reflex to ID Panel     Status: None (Preliminary result)   Collection Time: 05/21/20 11:33 AM   Specimen: BLOOD LEFT ARM  Result Value Ref Range Status   Specimen Description BLOOD LEFT ARM  Final   Special Requests   Final    BOTTLES DRAWN AEROBIC AND ANAEROBIC Blood Culture adequate volume   Culture   Final    NO GROWTH < 24 HOURS Performed at [facility], [place] [address], [place], [place] [address]    Report Status PENDING  Incomplete  SARS Coronavirus 2 by RT PCR (hospital order, performed in [facility] hospital lab)  Nasopharyngeal Nasopharyngeal Swab     Status: None   Collection Time: 05/21/20 11:36 AM   Specimen: Nasopharyngeal Swab  Result Value Ref Range Status   SARS Coronavirus 2 NEGATIVE NEGATIVE Final    Comment: (NOTE) SARS-CoV-2 target nucleic acids are NOT DETECTED. The SARS-CoV-2 RNA is generally detectable in upper and lower respiratory specimens during the acute phase of infection. The  lowest concentration of SARS-CoV-2 viral copies this assay can detect is 250 copies / mL. A negative result does not preclude SARS-CoV-2 infection and should not be used as the sole basis for treatment or other patient management decisions.  A negative result may occur with improper specimen collection / handling, submission of specimen other than nasopharyngeal swab, presence of viral mutation(s) within the areas targeted by this assay, and inadequate number of viral copies (<250 copies / mL). A negative result must be combined with clinical observations, patient history, and epidemiological information. Fact Sheet for Patients:   StrictlyIdeas.no Fact Sheet for Healthcare Providers: BankingDealers.co.za This test is not yet approved or cleared  by the [place] FDA and has been authorized for detection and/or diagnosis of SARS-CoV-2 by FDA under an Emergency Use Authorization (EUA).  This EUA will remain in effect (meaning this test can be used) for the duration of the COVID-19 declaration under Section 564(b)(1) of the Act, 21 U.S.C. section 360bbb-3(b)(1), unless the authorization is terminated or revoked sooner. Performed at [facility], [place] [address], [place], [place] [address]     Procedures and diagnostic studies:  DG Chest 1 View  Result Date: 05/21/2020 CLINICAL DATA:  Pain, no trauma EXAM: CHEST  1 VIEW COMPARISON:  03/28/2020 FINDINGS: Cardiomegaly status post median sternotomy and CABG with left chest multi lead pacer  defibrillator. Both lungs are clear. Large, densely calcified nodule of the left upper lobe. The visualized skeletal structures are unremarkable. IMPRESSION: Cardiomegaly without acute abnormality of the lungs in AP portable projection. Electronically Signed   By: Eddie Candle M.D.   On: 05/21/2020 13:08   DG Tibia/Fibula Right  Result Date: 05/21/2020 CLINICAL DATA:  Right leg pain. EXAM: RIGHT TIBIA AND FIBULA - 2 VIEW COMPARISON:  None. FINDINGS: The knee and ankle joints are maintained. Moderate degenerative changes. The tibia and fibula are intact. No stress fracture or bone lesion. Surgical changes noted from vein harvesting. IMPRESSION: No acute bony findings. Electronically Signed   By: Marijo Sanes M.D.   On: 05/21/2020 13:11   DG Knee Complete 4 Views Right  Result Date: 05/21/2020 CLINICAL DATA:  Right knee pain. EXAM: RIGHT KNEE - COMPLETE 4+ VIEW COMPARISON:  None. FINDINGS: Mild tricompartmental degenerative changes but no acute bony findings, osteochondral lesion or chondrocalcinosis. No definite joint effusion. IMPRESSION: Mild tricompartmental degenerative changes but no acute bony findings or joint effusion. Electronically Signed   By: Marijo Sanes M.D.   On: 05/21/2020 13:10   DG Foot 2 Views Right  Result Date: 05/21/2020 CLINICAL DATA:  Right foot pain. EXAM: RIGHT FOOT - 2 VIEW COMPARISON:  None. FINDINGS: Moderate degenerative changes but no acute bony findings or erosions. Mild pes cavus. IMPRESSION: Moderate degenerative changes but no acute bony findings. Electronically Signed   By: Marijo Sanes M.D.   On: 05/21/2020 13:09   VAS [place] LOWER EXTREMITY VENOUS (DVT) (ONLY [facility] & [facility])  Result Date: 05/21/2020  Lower Venous DVTStudy Indications: Swelling.  Comparison Study: No prior study Performing Technologist: Maudry Mayhew MHA, RDMS, RVT, RDCS  Examination Guidelines: A complete evaluation includes B-mode imaging, spectral Doppler, color Doppler, and power Doppler as needed of  all accessible portions of each vessel. Bilateral testing is considered an integral part of a complete examination. Limited examinations for reoccurring indications may be performed as noted. The reflux portion of the exam is performed with the patient in reverse Trendelenburg.  +---------+---------------+---------+-----------+----------+--------------+ RIGHT    CompressibilityPhasicitySpontaneityPropertiesThrombus Aging +---------+---------------+---------+-----------+----------+--------------+ CFV  Full           Yes      Yes                                 +---------+---------------+---------+-----------+----------+--------------+ FV Prox  Full                                                        +---------+---------------+---------+-----------+----------+--------------+ FV Mid   Full                                                        +---------+---------------+---------+-----------+----------+--------------+ FV DistalFull                                                        +---------+---------------+---------+-----------+----------+--------------+ PFV      Full                                                        +---------+---------------+---------+-----------+----------+--------------+ POP      Full           Yes      Yes                                 +---------+---------------+---------+-----------+----------+--------------+ PTV      Full                                                        +---------+---------------+---------+-----------+----------+--------------+ PERO     Full                                                        +---------+---------------+---------+-----------+----------+--------------+   Right Technical Findings: Not visualized segments include SFJ.  +---------+---------------+---------+-----------+----------+--------------+ LEFT     CompressibilityPhasicitySpontaneityPropertiesThrombus Aging  +---------+---------------+---------+-----------+----------+--------------+ CFV      Full           Yes      Yes                                 +---------+---------------+---------+-----------+----------+--------------+ FV Prox  Full                                                        +---------+---------------+---------+-----------+----------+--------------+  FV Mid   Full                                                        +---------+---------------+---------+-----------+----------+--------------+ FV DistalFull                                                        +---------+---------------+---------+-----------+----------+--------------+ POP      Full           Yes      Yes                                 +---------+---------------+---------+-----------+----------+--------------+ PTV      Full                                                        +---------+---------------+---------+-----------+----------+--------------+ PERO     Full                                                        +---------+---------------+---------+-----------+----------+--------------+   Left Technical Findings: Not visualized segments include SFJ, PFV.   Summary: RIGHT: - There is no evidence of deep vein thrombosis in the lower extremity. However, portions of this examination were limited- see technologist comments above.  - No cystic structure found in the popliteal fossa.  LEFT: - There is no evidence of deep vein thrombosis in the lower extremity. However, portions of this examination were limited- see technologist comments above.  - No cystic structure found in the popliteal fossa.  *See table(s) above for measurements and observations. Electronically signed by Curt Jews MD on 05/21/2020 at 5:20:08 PM.    Final     Medications:   . apixaban  5 mg Oral BID  . atorvastatin  80 mg Oral q1800  . calcitRIOL  0.25 mcg Oral Daily  . carvedilol  25 mg Oral BID WC  .  clopidogrel  75 mg Oral Daily  . colchicine  0.6 mg Oral BID  . ferrous sulfate  325 mg Oral Q M,W,F  . furosemide  40 mg Oral Daily  . hydrALAZINE  75 mg Oral TID  . ketotifen  1 drop Both Eyes BID  . mouth rinse  15 mL Mouth Rinse BID  . pantoprazole  40 mg Oral Daily  . predniSONE  40 mg Oral Q breakfast  . primidone  100 mg Oral BID  . senna-docusate  1 tablet Oral QHS  . sertraline  25 mg Oral QHS  . [START ON 05/25/2020] Vitamin D (Ergocalciferol)  50,000 Units Oral Q Sun   Continuous Infusions:   LOS: 1 day   Geradine Girt  [facility]   How to contact the [facility] Attending or Consulting provider [place] or covering provider during  after hours 7P -7A, for this patient?  1. Check the care team in [facility] and look for a) attending/consulting TR[facility] provider listed and b) the [facility] team listed 2. Log into [email or web address] and use [place]'s universal password to access. If you do not have the password, please contact the hospital operator. 3. Locate the [facility] provider you are looking for under [facility] and page to a number that you can be directly reached. 4. If you still have difficulty reaching the provider, please page the [facility] (Director on Call) for the Hospitalists listed on amion for assistance.  05/22/2020, 11:35 AM

## 2020-05-22 NOTE — Progress Notes (Signed)
Patient refused CPAP for tonight. Patient stated he woke up this morning at 0300 and felt like the CPAP was smothering him and that he did not want to wear it tonight. RT instructed patient that if he changes his mind to have RT called to try CPAP again. RT will monitor as needed.

## 2020-05-22 NOTE — Discharge Instructions (Addendum)
Low Purine/ Purine Restricted Nutrition Therapy  . This diet will help reduce the amount of uric acid in your blood.  . You will need to limit foods with purine (a kind of uric acid).  . You should drink little or no alcohol.   Foods Recommended The chart shows foods that are low to moderate in purines. . You can eat any amount of the foods that are low in purine.  . For the foods that are moderate in purines, stick to the amounts shown in the chart.  Food Group Foods Low in Purines Foods Moderate in Purines  Beverages Water, tea, coffee, cocoa   Breads and Cereals Bread, pasta, rice, cake, cornbread, popcorn Oatmeal (do not eat more than 2/3 cup uncooked, daily) Wheat bran, wheat germ (do not eat more than 1/4 cup dry, daily)  Condiments Salt, herbs, olives, pickles, relishes, vinegar   Dairy All dairy foods (low-fat or fat-free types are best)   Fats and Oils All types, except gravies and sauces made with meat   Fruits All   Proteins Eggs, nuts, peanut butter Meat and Poultry Crab, lobster, oysters and shrimp (limit to 1-2 servings* daily) Dried beans, peas, and lentils (limit to 1 cup cooked daily)  Soups Soups made without meat Meat- or fish-based soups, broths, or bouillons  Vegetables All vegetables, except those that are moderate in purines Asparagus, cauliflower, spinach, mushrooms, green peas (do not eat more than 1/2 cup of these vegetables daily)  Other Foods Sugars, sweets, gelatin   *1 serving = 2-3 ounces.  Foods Not Recommended No foods must be completely avoided. However, you should limit foods that are high in purines. Food Group Foods High in Purines  Beverages Beer and other alcoholic beverages  Fats and Oils Gravies and sauces made with meat  Proteins Anchovies, sardines, herring, mussels, tuna, codfish, scallops, trout, and haddock; bacon; organ meats (such as liver or kidney); tripe; sweetbreads; wild game; goose  Other Yeast and yeast extracts (taken as  supplements)   Low-Purine/Purine-Restricted Sample 1-Day Menu  Breakfast 1 cup cereal  1 cup 1% or fat-free milk  1 slice whole wheat toast  1 teaspoon butter or margarine and 1 teaspoon jam (for toast)  1/2 cup orange juice  1 cup coffee  Lunch Sandwich made with 2 slice whole wheat bread  2 tablespoons peanut butter  1/2 cup banana slices  1/2 cup fruit crisp  1 cup 1% or fat-free milk  Dinner 1 cup cheese lasagna  Salad made with 1 cup lettuce and 1/2 cup tomatoes  1 slice [place] bread  1/2 cup fruit gelatin  1 cup water   Low-Purine/Purine-Restricted Vegan Sample 1-Day Menu  Breakfast 1 cup grits   cup grapefruit  1 slice whole wheat toast  1 teaspoon margarine, soft, tub  1 cup soymilk fortified with calcium, vitamin B12, and vitamin D  1 cup coffee  Lunch 2 slices whole wheat bread  2 tablespoons peanut butter  1 banana   cup hummus   cup carrot strips   cup celery strips  1 cup soymilk fortified with calcium, vitamin B12, and vitamin D  Evening Meal 1 black bean veggie burger  1 medium sweet potato, baked  1 cup steamed broccoli  2 teaspoons olive oil   cup applesauce  1 small roll  6 ounces vanilla soy yogurt   Low-Purine/Purine-Restricted Vegetarian (Lacto-Ovo) Sample 1-Day Menu  Breakfast 1 cup grits   grapefruit  1 hard-boiled egg  1 cup 1% milk  1 cup coffee  Lunch 2 slices whole wheat bread  2 tablespoons peanut butter  1 banana   cup carrot strips   cup celery strips  1 cup 1% milk  Evening Meal 1 cup whole wheat spaghetti   cup kidney beans   cup marinara sauce with mushrooms and green peppers  1 cup steamed broccoli  1 teaspoons olive oil   cup grapes  6 ounces low-fat vanilla yogurt  Copyright 2021  [employer]. All rights reserved.     Information on my medicine - ELIQUIS (apixaban)  Why was Eliquis prescribed for you? Eliquis was prescribed for you to reduce the risk of a blood clot forming  that can cause a stroke if you have a medical condition called atrial fibrillation (a type of irregular heartbeat).  What do You need to know about Eliquis ? Take your Eliquis TWICE DAILY - one tablet in the morning and one tablet in the evening with or without food. If you have difficulty swallowing the tablet whole please discuss with your pharmacist how to take the medication safely.  Take Eliquis exactly as prescribed by your doctor and DO NOT stop taking Eliquis without talking to the doctor who prescribed the medication.  Stopping may increase your risk of developing a stroke.  Refill your prescription before you run out.  After discharge, you should have regular check-up appointments with your healthcare provider that is prescribing your Eliquis.  In the future your dose may need to be changed if your kidney function or weight changes by a significant amount or as you get older.  What do you do if you miss a dose? If you miss a dose, take it as soon as you remember on the same day and resume taking twice daily.  Do not take more than one dose of ELIQUIS at the same time to make up a missed dose.  Important Safety Information A possible side effect of Eliquis is bleeding. You should call your healthcare provider right away if you experience any of the following: ? Bleeding from an injury or your nose that does not stop. ? Unusual colored urine (red or dark brown) or unusual colored stools (red or black). ? Unusual bruising for unknown reasons. ? A serious fall or if you hit your head (even if there is no bleeding).  Some medicines may interact with Eliquis and might increase your risk of bleeding or clotting while on Eliquis. To help avoid this, consult your healthcare provider or pharmacist prior to using any new prescription or non-prescription medications, including herbals, vitamins, non-steroidal anti-inflammatory drugs (NSAIDs) and supplements.  This website has more  information on Eliquis (apixaban): http://[email or web address].com/eliquis/home

## 2020-05-22 NOTE — Progress Notes (Addendum)
Nutrition Brief Note  RD consulted for assessment and diet education secondary to gout.   Wt Readings from Last 15 Encounters:  05/21/20 107 kg  04/04/20 110.2 kg  03/07/20 108.9 kg  03/20/15 96.2 kg  03/13/15 97.5 kg   Alexander Austin is a 74 y.o. male with medical history significant of afib, on Eliquis;renal cell cancer, 9/12 s/p right partial nephrectomy; stage 4 CKD; gout, CAD s/p CABG; chronic combined systolic and diastolic CHF,ischemic cardiomyopathy with an AICD;HTN;OSA; andCVApresenting with multiple joint pain and swelling.   Pt admitted with gout flare.   Pt sleeping soundly at time of visit. He did not respond to name being called.   RD provided "Low Purine/ Purine Restricted Nutrition Therapy" handout from [facility] Nutrition Care Manual. Attached to AVS/ discharge instructions.   Per therapy notes, recommendations for SNF vs home health.   Nutrition-Focused physical exam completed. Findings are no fat depletion, no muscle depletion, and mild edema.   Current diet order is heart healthy, patient is consuming approximately 100% of meals at this time. Labs and medications reviewed.   No nutrition interventions warranted at this time. If nutrition issues arise, please consult RD.   Loistine Chance, RD, LDN, [employer] Registered Dietitian II Certified Diabetes Care and Education Specialist Please refer to [facility] for RD and/or RD on-call/weekend/after hours pager

## 2020-05-22 NOTE — Evaluation (Signed)
Physical Therapy Evaluation Patient Details Name: Alexander Austin MRN: [id number] DOB: 1946/07/12 Today's Date: 05/22/2020   History of Present Illness  Pt is a 74 y/o male admitted secondary to multiple joint pain, found to have a gouty flare. PMh including but not limited to afib, on Eliquis; renal cell cancer, 9/12 s/p right partial nephrectomy; stage 4 CKD; gout, CAD s/p CABG; chronic combined systolic and diastolic CHF,  ischemic cardiomyopathy with an AICD; HTN; OSA; and CVA.    Clinical Impression  Pt presented supine in bed with HOB elevated, awake and willing to participate in therapy session. Prior to admission, pt reported that he ambulated with use of a cane PRN and was independent with ADLs. He has a [place] aide that comes to his home Monday through Friday for three hours to perform IADLs, housework, laundry, etc. At the time of evaluation, pt significantly limited overall with mobility secondary to bilateral LE pain. He stated that all he could do was sit up at this time. Based off of pt's current functional mobility status and lack of support at home, would recommend that he d/c to SNF for further intensive therapy services to maximize his independence with functional mobility prior to returning home alone.    Follow Up Recommendations SNF;Other (comment)(if pt refuses, will need HHPT and 24/7 assist)    Equipment Recommendations  Wheelchair (measurements PT);Wheelchair cushion (measurements PT)    Recommendations for Other Services       Precautions / Restrictions Precautions Precautions: Fall Restrictions Weight Bearing Restrictions: No      Mobility  Bed Mobility Overal bed mobility: Needs Assistance Bed Mobility: Supine to Sit;Sit to Supine     Supine to sit: Min guard Sit to supine: Supervision   General bed mobility comments: pt only agreeable to long sit in bed; used bilateral UEs on bed rails to elevate trunk into upright position  Transfers                 General transfer comment: pt refusing  Ambulation/Gait                Stairs            Wheelchair Mobility    Modified Rankin (Stroke Patients Only)       Balance Overall balance assessment: Needs assistance Sitting-balance support: Feet supported;Bilateral upper extremity supported Sitting balance-Leahy Scale: Poor                                       Pertinent Vitals/Pain Pain Assessment: Faces Faces Pain Scale: Hurts even more Pain Location: bilateral LEs Pain Descriptors / Indicators: Grimacing;Guarding Pain Intervention(s): Monitored during session;Repositioned    Home Living Family/patient expects to be discharged to:: Private residence Living Arrangements: Alone Available Help at Discharge: Friend(s);Personal care attendant;Available PRN/intermittently;Other (Comment)(has an aide Mon-Fri x3 hours)   Home Access: Level entry     Home Layout: One level Home Equipment: Walker - 4 wheels;Grab bars - tub/shower;Shower seat;Cane - single point      Prior Function Level of Independence: Needs assistance   Gait / Transfers Assistance Needed: ambulates with a cane PRN  ADL's / Homemaking Assistance Needed: Has an aide 5 days per week, 3 hours daily for housework/cleaning/laundry/etc        Hand Dominance        Extremity/Trunk Assessment   Upper Extremity Assessment Upper Extremity Assessment: Generalized weakness  Lower Extremity Assessment Lower Extremity Assessment: Generalized weakness;LLE deficits/detail;RLE deficits/detail RLE Deficits / Details: pt with no attempted active movement throughout secondary to significant pain RLE: Unable to fully assess due to pain LLE Deficits / Details: pt with no attempted active movement throughout secondary to significant pain LLE: Unable to fully assess due to pain       Communication   Communication: No difficulties  Cognition Arousal/Alertness: Awake/alert Behavior During  Therapy: WFL for tasks assessed/performed Overall Cognitive Status: Within Functional Limits for tasks assessed                                        General Comments      Exercises     Assessment/Plan    PT Assessment Patient needs continued PT services  PT Problem List Decreased strength;Decreased activity tolerance;Decreased range of motion;Decreased mobility;Decreased balance;Decreased coordination;Decreased safety awareness;Decreased knowledge of use of DME;Decreased knowledge of precautions;Pain       PT Treatment Interventions DME instruction;Gait training;Stair training;Functional mobility training;Therapeutic activities;Therapeutic exercise;Balance training;Neuromuscular re-education;Patient/family education    PT Goals (Current goals can be found in the Care Plan section)  Acute Rehab PT Goals Patient Stated Goal: decrease pain PT Goal Formulation: With patient Time For Goal Achievement: 06/05/20 Potential to Achieve Goals: Good    Frequency Min 3X/week   Barriers to discharge        Co-evaluation               AM-PAC PT "6 Clicks" Mobility  Outcome Measure Help needed turning from your back to your side while in a flat bed without using bedrails?: A Little Help needed moving from lying on your back to sitting on the side of a flat bed without using bedrails?: A Little Help needed moving to and from a bed to a chair (including a wheelchair)?: A Lot Help needed standing up from a chair using your arms (e.g., wheelchair or bedside chair)?: A Lot Help needed to walk in hospital room?: Total Help needed climbing 3-5 steps with a railing? : Total 6 Click Score: 12    End of Session   Activity Tolerance: Patient limited by pain Patient left: in bed;with call bell/phone within reach;with bed alarm set Nurse Communication: Mobility status PT Visit Diagnosis: Other abnormalities of gait and mobility (R26.89);Pain Pain - Right/Left:  (bilateral) Pain - part of body: Leg;Knee;Ankle and joints of foot    Time: 6644-0347 PT Time Calculation (min) (ACUTE ONLY): 14 min   Charges:   PT Evaluation $PT Eval Moderate Complexity: 1 Mod          Eduard Clos, PT, DPT  Acute Rehabilitation Services Pager [phone] Office [place] 05/22/2020, 10:43 AM

## 2020-05-23 MED ORDER — DICLOFENAC SODIUM 1 % EX GEL
2.0000 g | Freq: Four times a day (QID) | CUTANEOUS | Status: DC
  Administered 2020-05-23 – 2020-05-26 (×13): 2 g via TOPICAL
  Filled 2020-05-23: qty 100

## 2020-05-23 NOTE — Progress Notes (Signed)
Progress Note    Alexander Austin  FUX:[id number] DOB: [birth year]  DOA: 05/21/2020 PCP: Donald Prose, PA-C    Brief Narrative:     Medical records reviewed and are as summarized below:  Alexander Austin is an 74 y.o. male with medical history significant of afib, on Eliquis;renal cell cancer, 9/12 s/p right partial nephrectomy; stage 4 CKD; gout, CAD s/p CABG; chronic combined systolic and diastolic CHF,ischemic cardiomyopathy with an AICD;HTN;OSA; andCVApresenting with multiple joint pain and swelling.   Symptoms started 2 days ago, initially was left knee then soon progressed to right knee bilateral ankles right big toe and both feet.  Unable to stand on his feet because of severe pain involving bilateral knees.  Denies any fever chills.  Try to take some OTC without any effect.     Assessment/Plan:   Active Problems:   Gout flare   Acute gouty flareup with ambulation dysfunction -multiple joints -failed outpatient regimen of allopurinol and colchicineas outpatient -Start prednisone 40 mg daily on 6/3- monitor for improvement -Increase colchicine to 0.6 mg twice daily, since flareup started more than 48-hour -Pain control -Consult dietitian for recommendation of gout prevention -slow to improve  Hypokalemia -replace  Hypomagnesium -P.o. replacement  History of combined systolic and diastolic CHF -continue p.o. Lasix -BP high, increase hydralazine dose  CKD 4-History of renal cell cancer -Continue p.o. Lasix  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 with 4 stentsEssential hypertension, benign -Blood pressure somewhat high, cont BP meds, increase hydralazine dosage -ContinuePlavix and Lipitor  PAF (paroxysmal atrial fibrillation) -cont Coreg and Eliquis  Major depressive disorder  - Zoloft  GERD - cont PPI  Tremor - cont Mysoline  Obesity Body mass index is 30.3 kg/m.   Family  Communication/Anticipated D/C date and plan/Code Status   DVT prophylaxis: Lovenox ordered. Code Status: partial  Disposition Plan: Status is: Inpatient  Remains inpatient appropriate because:Unsafe d/c plan   Dispo: The patient is from: Home              Anticipated d/c is to: Home              Anticipated d/c date is: 2 days              Patient currently is not medically stable to d/c.          Medical Consultants:    None.     Subjective:   Right knee most painful today Objective:    Vitals:   05/23/20 0019 05/23/20 0607 05/23/20 0918 05/23/20 1201  BP: 139/62 (!) 148/75 (!) 145/70 131/62  Pulse: (!) 59 (!) 55 60 (!) 59  Resp: 18 18  16   Temp: 98.1 F (36.7 C) 97.6 F (36.4 C)  97.8 F (36.6 C)  TempSrc: Oral Oral  Oral  SpO2: 98% 99% 100% 92%  Weight:      Height:        Intake/Output Summary (Last 24 hours) at 05/23/2020 1207 Last data filed at 05/23/2020 0600 Gross per 24 hour  Intake --  Output 250 ml  Net -250 ml   Filed Weights   05/21/20 1009  Weight: 107 kg    Exam:  General: Appearance:    Obese male in no acute distress     Lungs:     Clear to auscultation bilaterally, respirations unlabored  Heart:    Bradycardic. Irregularly irregular rhythm. No murmurs, rubs, or gallops.   MS:   All extremities  are intact.   Neurologic:   Awake, alert, oriented x 3. No apparent focal neurological           defect.      Data Reviewed:   I have personally reviewed following labs and imaging studies:  Labs: Labs show the following:   Basic Metabolic Panel: Recent Labs  Lab 05/21/20 1135 05/21/20 1135 05/21/20 2000 05/22/20 0454  NA 141  --   --  139  K 2.6*   < > 3.6 3.1*  CL 104  --   --  106  CO2 24  --   --  24  GLUCOSE 106*  --   --  111*  BUN 17  --   --  21  CREATININE 1.64*  --   --  1.67*  CALCIUM 8.5*  --   --  8.1*   < > = values in this interval not displayed.   GFR Estimated Creatinine Clearance: 50.6 mL/min (A)  (by C-G formula based on SCr of 1.67 mg/dL (H)). Liver Function Tests: Recent Labs  Lab 05/21/20 1135  AST 15  ALT 17  ALKPHOS 79  BILITOT 1.4*  PROT 5.8*  ALBUMIN 3.0*   No results for input(s): LIPASE, AMYLASE in the last 168 hours. No results for input(s): AMMONIA in the last 168 hours. Coagulation profile No results for input(s): INR, PROTIME in the last 168 hours.  CBC: Recent Labs  Lab 05/21/20 1135  WBC 6.2  HGB 11.3*  HCT 35.1*  MCV 88.4  PLT 151   Cardiac Enzymes: Recent Labs  Lab 05/21/20 1135  CKTOTAL 28*   BNP (last 3 results) No results for input(s): PROBNP in the last 8760 hours. CBG: No results for input(s): GLUCAP in the last 168 hours. D-Dimer: No results for input(s): DDIMER in the last 72 hours. Hgb A1c: No results for input(s): HGBA1C in the last 72 hours. Lipid Profile: No results for input(s): CHOL, HDL, LDLCALC, TRIG, CHOLHDL, LDLDIRECT in the last 72 hours. Thyroid function studies: No results for input(s): TSH, T4TOTAL, T3FREE, THYROIDAB in the last 72 hours.  Invalid input(s): FREET3 Anemia work up: No results for input(s): VITAMINB12, FOLATE, FERRITIN, TIBC, IRON, RETICCTPCT in the last 72 hours. Sepsis Labs: Recent Labs  Lab 05/21/20 1135  WBC 6.2  LATICACIDVEN 1.3    Microbiology Recent Results (from the past 240 hour(s))  Culture, blood (single) w Reflex to ID Panel     Status: None (Preliminary result)   Collection Time: 05/21/20 11:33 AM   Specimen: BLOOD LEFT ARM  Result Value Ref Range Status   Specimen Description BLOOD LEFT ARM  Final   Special Requests   Final    BOTTLES DRAWN AEROBIC AND ANAEROBIC Blood Culture adequate volume   Culture   Final    NO GROWTH < 24 HOURS Performed at [facility], [place] [address] [place], [place] [address]    Report Status PENDING  Incomplete  SARS Coronavirus 2 by RT PCR (hospital order, performed in [facility] hospital lab) Nasopharyngeal Nasopharyngeal Swab     Status:  None   Collection Time: 05/21/20 11:36 AM   Specimen: Nasopharyngeal Swab  Result Value Ref Range Status   SARS Coronavirus 2 NEGATIVE NEGATIVE Final    Comment: (NOTE) SARS-CoV-2 target nucleic acids are NOT DETECTED. The SARS-CoV-2 RNA is generally detectable in upper and lower respiratory specimens during the acute phase of infection. The lowest concentration of SARS-CoV-2 viral copies this assay can detect is 250  copies / mL. A negative result does not preclude SARS-CoV-2 infection and should not be used as the sole basis for treatment or other patient management decisions.  A negative result may occur with improper specimen collection / handling, submission of specimen other than nasopharyngeal swab, presence of viral mutation(s) within the areas targeted by this assay, and inadequate number of viral copies (<250 copies / mL). A negative result must be combined with clinical observations, patient history, and epidemiological information. Fact Sheet for Patients:   StrictlyIdeas.no Fact Sheet for Healthcare Providers: BankingDealers.co.za This test is not yet approved or cleared  by the [place] FDA and has been authorized for detection and/or diagnosis of SARS-CoV-2 by FDA under an Emergency Use Authorization (EUA).  This EUA will remain in effect (meaning this test can be used) for the duration of the COVID-19 declaration under Section 564(b)(1) of the Act, 21 U.S.C. section 360bbb-3(b)(1), unless the authorization is terminated or revoked sooner. Performed at [facility], [place] [address] [place], [place] [address]     Procedures and diagnostic studies:  DG Chest 1 View  Result Date: 05/21/2020 CLINICAL DATA:  Pain, no trauma EXAM: CHEST  1 VIEW COMPARISON:  03/28/2020 FINDINGS: Cardiomegaly status post median sternotomy and CABG with left chest multi lead pacer defibrillator. Both lungs are clear. Large, densely  calcified nodule of the left upper lobe. The visualized skeletal structures are unremarkable. IMPRESSION: Cardiomegaly without acute abnormality of the lungs in AP portable projection. Electronically Signed   By: Eddie Candle M.D.   On: 05/21/2020 13:08   DG Tibia/Fibula Right  Result Date: 05/21/2020 CLINICAL DATA:  Right leg pain. EXAM: RIGHT TIBIA AND FIBULA - 2 VIEW COMPARISON:  None. FINDINGS: The knee and ankle joints are maintained. Moderate degenerative changes. The tibia and fibula are intact. No stress fracture or bone lesion. Surgical changes noted from vein harvesting. IMPRESSION: No acute bony findings. Electronically Signed   By: Marijo Sanes M.D.   On: 05/21/2020 13:11   DG Knee Complete 4 Views Right  Result Date: 05/21/2020 CLINICAL DATA:  Right knee pain. EXAM: RIGHT KNEE - COMPLETE 4+ VIEW COMPARISON:  None. FINDINGS: Mild tricompartmental degenerative changes but no acute bony findings, osteochondral lesion or chondrocalcinosis. No definite joint effusion. IMPRESSION: Mild tricompartmental degenerative changes but no acute bony findings or joint effusion. Electronically Signed   By: Marijo Sanes M.D.   On: 05/21/2020 13:10   DG Foot 2 Views Right  Result Date: 05/21/2020 CLINICAL DATA:  Right foot pain. EXAM: RIGHT FOOT - 2 VIEW COMPARISON:  None. FINDINGS: Moderate degenerative changes but no acute bony findings or erosions. Mild pes cavus. IMPRESSION: Moderate degenerative changes but no acute bony findings. Electronically Signed   By: Marijo Sanes M.D.   On: 05/21/2020 13:09   VAS [place] LOWER EXTREMITY VENOUS (DVT) (ONLY [facility] & [facility])  Result Date: 05/21/2020  Lower Venous DVTStudy Indications: Swelling.  Comparison Study: No prior study Performing Technologist: Maudry Mayhew MHA, RDMS, RVT, RDCS  Examination Guidelines: A complete evaluation includes B-mode imaging, spectral Doppler, color Doppler, and power Doppler as needed of all accessible portions of each vessel. Bilateral  testing is considered an integral part of a complete examination. Limited examinations for reoccurring indications may be performed as noted. The reflux portion of the exam is performed with the patient in reverse Trendelenburg.  +---------+---------------+---------+-----------+----------+--------------+ RIGHT    CompressibilityPhasicitySpontaneityPropertiesThrombus Aging +---------+---------------+---------+-----------+----------+--------------+ CFV      Full  Yes      Yes                                 +---------+---------------+---------+-----------+----------+--------------+ FV Prox  Full                                                        +---------+---------------+---------+-----------+----------+--------------+ FV Mid   Full                                                        +---------+---------------+---------+-----------+----------+--------------+ FV DistalFull                                                        +---------+---------------+---------+-----------+----------+--------------+ PFV      Full                                                        +---------+---------------+---------+-----------+----------+--------------+ POP      Full           Yes      Yes                                 +---------+---------------+---------+-----------+----------+--------------+ PTV      Full                                                        +---------+---------------+---------+-----------+----------+--------------+ PERO     Full                                                        +---------+---------------+---------+-----------+----------+--------------+   Right Technical Findings: Not visualized segments include SFJ.  +---------+---------------+---------+-----------+----------+--------------+ LEFT     CompressibilityPhasicitySpontaneityPropertiesThrombus Aging  +---------+---------------+---------+-----------+----------+--------------+ CFV      Full           Yes      Yes                                 +---------+---------------+---------+-----------+----------+--------------+ FV Prox  Full                                                        +---------+---------------+---------+-----------+----------+--------------+  FV Mid   Full                                                        +---------+---------------+---------+-----------+----------+--------------+ FV DistalFull                                                        +---------+---------------+---------+-----------+----------+--------------+ POP      Full           Yes      Yes                                 +---------+---------------+---------+-----------+----------+--------------+ PTV      Full                                                        +---------+---------------+---------+-----------+----------+--------------+ PERO     Full                                                        +---------+---------------+---------+-----------+----------+--------------+   Left Technical Findings: Not visualized segments include SFJ, PFV.   Summary: RIGHT: - There is no evidence of deep vein thrombosis in the lower extremity. However, portions of this examination were limited- see technologist comments above.  - No cystic structure found in the popliteal fossa.  LEFT: - There is no evidence of deep vein thrombosis in the lower extremity. However, portions of this examination were limited- see technologist comments above.  - No cystic structure found in the popliteal fossa.  *See table(s) above for measurements and observations. Electronically signed by Curt Jews MD on 05/21/2020 at 5:20:08 PM.    Final     Medications:   . apixaban  5 mg Oral BID  . atorvastatin  80 mg Oral q1800  . calcitRIOL  0.25 mcg Oral Daily  . carvedilol  25 mg Oral BID WC  .  clopidogrel  75 mg Oral Daily  . colchicine  0.6 mg Oral BID  . diclofenac Sodium  2 g Topical QID  . ferrous sulfate  325 mg Oral Q M,W,F  . furosemide  40 mg Oral Daily  . hydrALAZINE  75 mg Oral TID  . ketotifen  1 drop Both Eyes BID  . mouth rinse  15 mL Mouth Rinse BID  . pantoprazole  40 mg Oral Daily  . predniSONE  40 mg Oral Q breakfast  . primidone  100 mg Oral BID  . senna-docusate  1 tablet Oral QHS  . sertraline  25 mg Oral QHS  . [START ON 05/25/2020] Vitamin D (Ergocalciferol)  50,000 Units Oral Q Sun   Continuous Infusions:   LOS: 2 days   Geradine Girt  [facility]   How to contact the [facility] Attending or  Consulting provider [place] or covering provider during after hours [place], for this patient?  1. Check the care team in [facility] and look for a) attending/consulting TRH provider listed and b) the [facility] team listed 2. Log into [email or web address] and use [place]'s universal password to access. If you do not have the password, please contact the hospital operator. 3. Locate the [facility] provider you are looking for under [facility] and page to a number that you can be directly reached. 4. If you still have difficulty reaching the provider, please page the [facility] (Director on Call) for the Hospitalists listed on amion for assistance.  05/23/2020, 12:07 PM

## 2020-05-23 NOTE — TOC Initial Note (Addendum)
Transition of Care [facility]) - Initial/Assessment Note    Patient Details  Name: Alexander Austin MRN: [id number] Date of Birth: [birth year]  Transition of Care [facility]) CM/SW Contact:    Marilu Favre, RN Phone Number: 05/23/2020, 11:32 AM  Clinical Narrative:                 Spoke to patient at bedside. Discussed PT recommendation for SNF. PAtient voiced understanding but is declining SNF at this time. He wants to return to home with Home health PT . Patient currently active with [facility] Confirmed with [name] with [facility] , she will need resumption of care for HHPT. She already has authorization from the [place].   Pt recommended wheel chair for home patient states he does not want a wheel chair. Patient currently on oxygen in hospital ( does not have home oxygen) . Will continue to follow for home oxygen needs. Patient does not not want home oxygen.   He lives alone in an apartment, in the basement area of his landlord's home, he has an aide 5 days /week for 3 hrs who cleans, fixes his meals, does his laundry and helps him with bathing.   He has 3 walkers, 2 rollators, and a scooter at home.   He states he is a [place] patient and goes to the [place].  Patient has now decided he does want a wheel chair. Spoke to [place] at the [place].Will enter order and fax order and clinicals including PT notes [facility] Windy Fast [phone]. Patient will need to follow up with [facility] to see if they approve wheel chair. If VA approves wheel chair VA will order and arrange delivery    Expected Discharge Plan: [place] Barriers to Discharge: Continued Medical Work up   Patient Goals and CMS Choice Patient states their goals for this hospitalization and ongoing recovery are:: to return to home CMS Medicare.gov Compare Post Acute Care list provided to:: Patient Choice offered to / list presented to : Patient  Expected Discharge Plan and Services Expected Discharge  Plan: [place]   Discharge Planning Services: CM Consult Post Acute Care Choice: [place] arrangements for the past 2 months: Apartment                 DME Arranged: N/A(refused wheel chair) DME Agency: NA       HH Arranged: PT HH Agency: [place] Date [place]: 05/23/20 Time HH Agency Contacted: 1130 Representative spoke with at [place]: [place] Arrangements/Services Living arrangements for the past 2 months: [place] with:: Self Patient language and need for interpreter reviewed:: Yes Do you feel safe going back to the place where you live?: Yes      Need for Family Participation in Patient Care: No (Comment) Care giver support system in place?: No (comment) Current home services: DME, Homehealth aide, Home PT Criminal Activity/Legal Involvement Pertinent to Current Situation/Hospitalization: No - Comment as needed  Activities of Daily Living Home Assistive Devices/Equipment: Cane (specify quad or straight), Walker (specify type), Blood pressure cuff, Scales ADL Screening (condition at time of admission) Patient's cognitive ability adequate to safely complete daily activities?: Yes Is the patient deaf or have difficulty hearing?: No Does the patient have difficulty seeing, even when wearing glasses/contacts?: No Does the patient have difficulty concentrating, remembering, or making decisions?: No Patient able to express need for assistance with  ADLs?: Yes Does the patient have difficulty dressing or bathing?: No Independently performs ADLs?: Yes (appropriate for developmental age) Does the patient have difficulty walking or climbing stairs?: Yes Weakness of Legs: Both Weakness of Arms/Hands: None  Permission Sought/Granted   Permission granted to share information with : No     Permission granted to share info w AGENCY: [facility]        Emotional Assessment Appearance:: Appears stated  age Attitude/Demeanor/Rapport: Self-Confident Affect (typically observed): Agitated Orientation: : Oriented to Self, Oriented to Place, Oriented to  Time, Oriented to Situation Alcohol / Substance Use: Not Applicable Psych Involvement: No (comment)  Admission diagnosis:  Hypokalemia [E87.6] Gout, arthritis [M10.9] Foot pain [M79.673] Gout flare [M10.9] Patient Active Problem List   Diagnosis Date Noted  . Gout flare 05/21/2020  . Acute respiratory failure with hypoxia ([place]) 03/31/2020  . Acute on chronic systolic CHF (congestive heart failure) ([place]) 03/28/2020  . Acute gout 03/07/2020  . CKD (chronic kidney disease) stage 4, GFR 15-29 ml/min (HCC) 03/07/2020  . Obesity (BMI 30.0-34.9) 03/07/2020  . Tremor 04/16/2015  . BPH (benign prostatic hyperplasia) 04/16/2015  . Chronic anticoagulation 04/16/2015  . Edema 04/16/2015  . Urinary retention 03/06/2015  . 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 ([place]) 03/06/2015  . NSTEMI (non-ST elevated myocardial infarction) ([place]) 03/06/2015  . PAF (paroxysmal atrial fibrillation) ([place]) 03/06/2015  . History of renal cell cancer 03/06/2015  . Vitamin B12 deficiency 03/06/2015  . Major depressive disorder, recurrent, in remission ([place]) 03/06/2015  . History of stroke 03/06/2015  . GERD (gastroesophageal reflux disease) 03/06/2015  . Essential hypertension, benign 03/06/2015  . Chronic gout 03/06/2015   PCP:  Donald Prose, PA-C Pharmacy:   [id number] - [place], [place] [address] Phone: [phone] Fax: [phone]  [id number] [place] [address] Phone: [phone] Fax: [phone]     Social Determinants of Health (SDOH) Interventions    Readmission Risk Interventions Readmission Risk Prevention Plan 04/03/2020  Transportation  Screening Complete  PCP or Specialist Appt within 3-5 Days Complete  HRI or [place] Complete  Social Work Consult for [place] Planning/Counseling Complete  Palliative Care Screening Not Applicable  Medication Review Press photographer) Complete  Some recent data might be hidden

## 2020-05-23 NOTE — TOC Initial Note (Signed)
Transition of Care [facility]) - Initial/Assessment Note    Patient Details  Name: Alexander Austin MRN: [id number] Date of Birth: 1946/09/27  Transition of Care [facility]) CM/SW Contact:    Jacquelynn Cree Phone Number: 05/23/2020, 11:24 AM  Clinical Narrative:                 CSW met with patient bedside to discuss PT recommendation. Patient expressed PTA he was living alone. Patient stated his landlord lives above him but he has no other familial contact he would like notified.   Patient stated he does not want to go to SNF and will be returning home. He previously had [facility] set up in the home. Patient stated he has 2 walkers, 3 canes and a scooter. No further questions expressed. TOC team will continue to follow & assist with discharge planning needs.   Expected Discharge Plan: [place] Barriers to Discharge: Continued Medical Work up   Patient Goals and CMS Choice Patient states their goals for this hospitalization and ongoing recovery are:: to return home CMS Medicare.gov Compare Post Acute Care list provided to:: Patient Choice offered to / list presented to : Patient  Expected Discharge Plan and Services Expected Discharge Plan: [place]       Living arrangements for the past 2 months: Single Family Home                                      Prior Living Arrangements/Services Living arrangements for the past 2 months: Single Family Home Lives with:: Self Patient language and need for interpreter reviewed:: Yes        Need for Family Participation in Patient Care: No (Comment) Care giver support system in place?: No (comment)   Criminal Activity/Legal Involvement Pertinent to Current Situation/Hospitalization: No - Comment as needed  Activities of Daily Living Home Assistive Devices/Equipment: Cane (specify quad or straight), Walker (specify type), Blood pressure cuff, Scales ADL Screening (condition at time of  admission) Patient's cognitive ability adequate to safely complete daily activities?: Yes Is the patient deaf or have difficulty hearing?: No Does the patient have difficulty seeing, even when wearing glasses/contacts?: No Does the patient have difficulty concentrating, remembering, or making decisions?: No Patient able to express need for assistance with ADLs?: Yes Does the patient have difficulty dressing or bathing?: No Independently performs ADLs?: Yes (appropriate for developmental age) Does the patient have difficulty walking or climbing stairs?: Yes Weakness of Legs: Both Weakness of Arms/Hands: None  Permission Sought/Granted                  Emotional Assessment Appearance:: Appears stated age Attitude/Demeanor/Rapport: Unable to Assess Affect (typically observed): Unable to Assess Orientation: : Oriented to Self, Oriented to Place, Oriented to Situation, Oriented to  Time Alcohol / Substance Use: Not Applicable Psych Involvement: No (comment)  Admission diagnosis:  Hypokalemia [E87.6] Gout, arthritis [M10.9] Foot pain [M79.673] Gout flare [M10.9] Patient Active Problem List   Diagnosis Date Noted  . Gout flare 05/21/2020  . Acute respiratory failure with hypoxia ([place]) 03/31/2020  . Acute on chronic systolic CHF (congestive heart failure) ([place]) 03/28/2020  . Acute gout 03/07/2020  . CKD (chronic kidney disease) stage 4, GFR 15-29 ml/min (HCC) 03/07/2020  . Obesity (BMI 30.0-34.9) 03/07/2020  . Tremor 04/16/2015  . BPH (benign prostatic hyperplasia) 04/16/2015  . Chronic anticoagulation 04/16/2015  .  Edema 04/16/2015  . Urinary retention 03/06/2015  . 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 NSTEMI (non-ST elevated myocardial infarction) ([place]) 03/06/2015  . PAF (paroxysmal atrial fibrillation) ([place]) 03/06/2015  . History of renal cell cancer 03/06/2015  . Vitamin B12  deficiency 03/06/2015  . Major depressive disorder, recurrent, in remission ([place]) 03/06/2015  . History of stroke 03/06/2015  . GERD (gastroesophageal reflux disease) 03/06/2015  . Essential hypertension, benign 03/06/2015  . Chronic gout 03/06/2015   PCP:  Donald Prose, PA-C Pharmacy:   [id number] [place] [place]Phone: [phone]: [phone] [id number] [place]: [phone]: [phone]   Social Determinants of Health (SDOH) Interventions    Readmission Risk Interventions Readmission Risk Prevention Plan 04/03/2020  Transportation Screening Complete  PCP or Specialist Appt within 3-5 Days Complete  HRI or [place]plete  Social Work Consult for [place] Gaplanning/Counseling Complete  Palliative Care Screening Not Applicable  Medication Review (RPress photographerComplete  Some recent data might be hidden

## 2020-05-23 NOTE — Progress Notes (Signed)
Patient refused CPAP for tonight. RT instructed patient to have RT called if he changes his mind. RT removed CPAP from room. RT will monitor as needed.

## 2020-05-23 NOTE — Care Management (Cosign Needed)
    Durable Medical Equipment  (From admission, onward)         Start     Ordered   05/23/20 1211  For home use only DME lightweight manual wheelchair with seat cushion  Once    Comments: Patient suffers from Acute gouty flareup with ambulation dysfunction which impairs their ability to perform daily activities like ambulating  in the home.  A cane  will not resolve  issue with performing activities of daily living. A wheelchair will allow patient to safely perform daily activities. Patient is not able to propel themselves in the home using a standard weight wheelchair due to Acute gouty flareup with ambulation dysfunction . Patient can self propel in the lightweight wheelchair. Length of need 3 months . Accessories: elevating leg rests (ELRs), wheel locks, extensions and anti-tippers.  Seat and back cushions   05/23/20 1211

## 2020-05-24 LAB — CBC
HCT: 30.2 % — ABNORMAL LOW (ref 39.0–52.0)
Hemoglobin: 9.8 g/dL — ABNORMAL LOW (ref 13.0–17.0)
MCH: 28.2 pg (ref 26.0–34.0)
MCHC: 32.5 g/dL (ref 30.0–36.0)
MCV: 86.8 fL (ref 80.0–100.0)
Platelets: 177 10*3/uL (ref 150–400)
RBC: 3.48 MIL/uL — ABNORMAL LOW (ref 4.22–5.81)
RDW: 15.2 % (ref 11.5–15.5)
WBC: 4.3 10*3/uL (ref 4.0–10.5)
nRBC: 0 % (ref 0.0–0.2)

## 2020-05-24 LAB — BASIC METABOLIC PANEL
Anion gap: 8 (ref 5–15)
BUN: 32 mg/dL — ABNORMAL HIGH (ref 8–23)
CO2: 24 mmol/L (ref 22–32)
Calcium: 8.3 mg/dL — ABNORMAL LOW (ref 8.9–10.3)
Chloride: 108 mmol/L (ref 98–111)
Creatinine, Ser: 1.63 mg/dL — ABNORMAL HIGH (ref 0.61–1.24)
GFR calc Af Amer: 47 mL/min — ABNORMAL LOW (ref >60)
GFR calc non Af Amer: 41 mL/min — ABNORMAL LOW (ref >60)
Glucose, Bld: 103 mg/dL — ABNORMAL HIGH (ref 70–99)
Potassium: 3.1 mmol/L — ABNORMAL LOW (ref 3.5–5.1)
Sodium: 140 mmol/L (ref 135–145)

## 2020-05-24 LAB — URIC ACID: Uric Acid, Serum: 7.9 mg/dL (ref 3.7–8.6)

## 2020-05-24 MED ORDER — POTASSIUM CHLORIDE CRYS ER 20 MEQ PO TBCR
40.0000 meq | EXTENDED_RELEASE_TABLET | Freq: Once | ORAL | Status: AC
  Administered 2020-05-24: 40 meq via ORAL
  Filled 2020-05-24: qty 2

## 2020-05-24 NOTE — Plan of Care (Signed)
  Problem: Education: Goal: Knowledge of General Education information will improve Description: Including pain rating scale, medication(s)/side effects and non-pharmacologic comfort measures Outcome: Progressing   Problem: Clinical Measurements: Goal: Ability to maintain clinical measurements within normal limits will improve Outcome: Progressing Goal: Will remain free from infection Outcome: Progressing Goal: Diagnostic test results will improve Outcome: Progressing   Problem: Clinical Measurements: Goal: Will remain free from infection Outcome: Progressing   Problem: Clinical Measurements: Goal: Diagnostic test results will improve Outcome: Progressing

## 2020-05-24 NOTE — Progress Notes (Signed)
Progress Note    Alexander Austin  PIR:[id number] DOB: March 30, 1946  DOA: 05/21/2020 PCP: Donald Prose, PA-C    Brief Narrative:     Medical records reviewed and are as summarized below:  Alexander Austin is an 74 y.o. male with medical history significant of afib, on Eliquis;renal cell cancer, 9/12 s/p right partial nephrectomy; stage 4 CKD; gout, CAD s/p CABG; chronic combined systolic and diastolic CHF,ischemic cardiomyopathy with an AICD;HTN;OSA; andCVApresenting with multiple joint pain and swelling.   Symptoms started 2 days ago, initially was left knee then soon progressed to right knee bilateral ankles right big toe and both feet.  Unable to stand on his feet because of severe pain involving bilateral knees.  Denies any fever chills.  Try to take some OTC without any effect.     Assessment/Plan:   Active Problems:   Gout flare   Acute gouty flareup with ambulation dysfunction -multiple joints -failed outpatient regimen of allopurinol and colchicineas outpatient -Started prednisone 40 mg daily on 6/3- monitor for improvement -Increased colchicine to 0.6 mg twice daily, since flareup started more than 48-hour -Pain control -Consult dietitian for recommendation of gout prevention -slow to improve- suspect can be d/c'd on 24-48 hours  Hypokalemia -replace  Hypomagnesium -P.o. replacement  History of combined systolic and diastolic CHF -continue p.o. Lasix -BP high, increase hydralazine dose  CKD 4-History of renal cell cancer -Continue p.o. Lasix  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 with 4 stentsEssential hypertension, benign -Blood pressure somewhat high, cont BP meds, increase hydralazine dosage -ContinuePlavix and Lipitor  PAF (paroxysmal atrial fibrillation) -cont Coreg and Eliquis  Major depressive disorder  - Zoloft  GERD - cont PPI  Tremor - cont Mysoline  Obesity Body mass index is  30.3 kg/m.   Family Communication/Anticipated D/C date and plan/Code Status   DVT prophylaxis: Lovenox ordered. Code Status: partial  Disposition Plan: Status is: Inpatient  Remains inpatient appropriate because:Unsafe d/c plan   Dispo: The patient is from: Home              Anticipated d/c is to: Home              Anticipated d/c date is:1- 2 days              Patient currently is not medically stable to d/c.          Medical Consultants:    None.     Subjective:   likes to sleep in on Saturdays  Objective:    Vitals:   05/23/20 1826 05/23/20 2138 05/24/20 0017 05/24/20 0446  BP: (!) 144/67 (!) 148/66 (!) 152/66 (!) 159/71  Pulse: 65  66 60  Resp: 18  16 17   Temp: 98 F (36.7 C)  97.9 F (36.6 C) 97.7 F (36.5 C)  TempSrc: Oral  Oral Oral  SpO2: 96% 96% 93% 96%  Weight:      Height:        Intake/Output Summary (Last 24 hours) at 05/24/2020 1045 Last data filed at 05/24/2020 0700 Gross per 24 hour  Intake 240 ml  Output 875 ml  Net -635 ml   Filed Weights   05/21/20 1009  Weight: 107 kg    Exam:   General: Appearance:    Obese male in no acute distress     Lungs:     Clear to auscultation bilaterally, respirations unlabored  Heart:    Normal heart rate.  No murmurs,  rubs, or gallops.   MS:   All extremities are intact. - less swelling and tenderness in right knee  Neurologic:   Awake, alert, oriented x 3. No apparent focal neurological           defect.       Data Reviewed:   I have personally reviewed following labs and imaging studies:  Labs: Labs show the following:   Basic Metabolic Panel: Recent Labs  Lab 05/21/20 1135 05/21/20 2000 05/22/20 0454 05/24/20 0640  NA 141  --  139 140  K 2.6*   < > 3.1* 3.1*  CL 104  --  106 108  CO2 24  --  24 24  GLUCOSE 106*  --  111* 103*  BUN 17  --  21 32*  CREATININE 1.64*  --  1.67* 1.63*  CALCIUM 8.5*  --  8.1* 8.3*   < > = values in this interval not displayed.    GFR Estimated Creatinine Clearance: 51.8 mL/min (A) (by C-G formula based on SCr of 1.63 mg/dL (H)). Liver Function Tests: Recent Labs  Lab 05/21/20 1135  AST 15  ALT 17  ALKPHOS 79  BILITOT 1.4*  PROT 5.8*  ALBUMIN 3.0*   No results for input(s): LIPASE, AMYLASE in the last 168 hours. No results for input(s): AMMONIA in the last 168 hours. Coagulation profile No results for input(s): INR, PROTIME in the last 168 hours.  CBC: Recent Labs  Lab 05/21/20 1135 05/24/20 0640  WBC 6.2 4.3  HGB 11.3* 9.8*  HCT 35.1* 30.2*  MCV 88.4 86.8  PLT 151 177   Cardiac Enzymes: Recent Labs  Lab 05/21/20 1135  CKTOTAL 28*   BNP (last 3 results) No results for input(s): PROBNP in the last 8760 hours. CBG: No results for input(s): GLUCAP in the last 168 hours. D-Dimer: No results for input(s): DDIMER in the last 72 hours. Hgb A1c: No results for input(s): HGBA1C in the last 72 hours. Lipid Profile: No results for input(s): CHOL, HDL, LDLCALC, TRIG, CHOLHDL, LDLDIRECT in the last 72 hours. Thyroid function studies: No results for input(s): TSH, T4TOTAL, T3FREE, THYROIDAB in the last 72 hours.  Invalid input(s): FREET3 Anemia work up: No results for input(s): VITAMINB12, FOLATE, FERRITIN, TIBC, IRON, RETICCTPCT in the last 72 hours. Sepsis Labs: Recent Labs  Lab 05/21/20 1135 05/24/20 0640  WBC 6.2 4.3  LATICACIDVEN 1.3  --     Microbiology Recent Results (from the past 240 hour(s))  Culture, blood (single) w Reflex to ID Panel     Status: None (Preliminary result)   Collection Time: 05/21/20 11:33 AM   Specimen: BLOOD LEFT ARM  Result Value Ref Range Status   Specimen Description BLOOD LEFT ARM  Final   Special Requests   Final    BOTTLES DRAWN AEROBIC AND ANAEROBIC Blood Culture adequate volume   Culture   Final    NO GROWTH 3 DAYS Performed at [facility], [address] [place], [place] [address]    Report Status PENDING  Incomplete  SARS  Coronavirus 2 by RT PCR (hospital order, performed in [facility] hospital lab) Nasopharyngeal Nasopharyngeal Swab     Status: None   Collection Time: 05/21/20 11:36 AM   Specimen: Nasopharyngeal Swab  Result Value Ref Range Status   SARS Coronavirus 2 NEGATIVE NEGATIVE Final    Comment: (NOTE) SARS-CoV-2 target nucleic acids are NOT DETECTED. The SARS-CoV-2 RNA is generally detectable in upper and lower respiratory specimens during the acute  phase of infection. The lowest concentration of SARS-CoV-2 viral copies this assay can detect is 250 copies / mL. A negative result does not preclude SARS-CoV-2 infection and should not be used as the sole basis for treatment or other patient management decisions.  A negative result may occur with improper specimen collection / handling, submission of specimen other than nasopharyngeal swab, presence of viral mutation(s) within the areas targeted by this assay, and inadequate number of viral copies (<250 copies / mL). A negative result must be combined with clinical observations, patient history, and epidemiological information. Fact Sheet for Patients:   StrictlyIdeas.no Fact Sheet for Healthcare Providers: BankingDealers.co.za This test is not yet approved or cleared  by the [place] FDA and has been authorized for detection and/or diagnosis of SARS-CoV-2 by FDA under an Emergency Use Authorization (EUA).  This EUA will remain in effect (meaning this test can be used) for the duration of the COVID-19 declaration under Section 564(b)(1) of the Act, 21 U.S.C. section 360bbb-3(b)(1), unless the authorization is terminated or revoked sooner. Performed at [facility], [place] [address] [place], [place] [address]     Procedures and diagnostic studies:  No results found.  Medications:   . apixaban  5 mg Oral BID  . atorvastatin  80 mg Oral q1800  . calcitRIOL  0.25 mcg Oral Daily  .  carvedilol  25 mg Oral BID WC  . clopidogrel  75 mg Oral Daily  . colchicine  0.6 mg Oral BID  . diclofenac Sodium  2 g Topical QID  . ferrous sulfate  325 mg Oral Q M,W,F  . furosemide  40 mg Oral Daily  . hydrALAZINE  75 mg Oral TID  . ketotifen  1 drop Both Eyes BID  . mouth rinse  15 mL Mouth Rinse BID  . pantoprazole  40 mg Oral Daily  . predniSONE  40 mg Oral Q breakfast  . primidone  100 mg Oral BID  . senna-docusate  1 tablet Oral QHS  . sertraline  25 mg Oral QHS  . [START ON 05/25/2020] Vitamin D (Ergocalciferol)  50,000 Units Oral Q Sun   Continuous Infusions:   LOS: 3 days   Geradine Girt  [facility]   How to contact the [facility] Attending or Consulting provider [place] or covering provider during after hours [place], for this patient?  1. Check the care team in [facility] and look for a) attending/consulting TR[facility] provider listed and b) the [facility] team listed 2. Log into [email or web address] and use [place]'s universal password to access. If you do not have the password, please contact the hospital operator. 3. Locate the [facility] provider you are looking for under [facility] and page to a number that you can be directly reached. 4. If you still have difficulty reaching the provider, please page the [facility] (Director on Call) for the Hospitalists listed on amion for assistance.  05/24/2020, 10:45 AM

## 2020-05-24 NOTE — Progress Notes (Signed)
Rt Note: Pt refusing CPAP at this time Stating he had" tried it here and at home and just can't do it'. I told him if he changes his mind to call RT

## 2020-05-25 MED ORDER — PREDNISONE 20 MG PO TABS
30.0000 mg | ORAL_TABLET | Freq: Every day | ORAL | Status: DC
  Administered 2020-05-26: 30 mg via ORAL
  Filled 2020-05-25: qty 1

## 2020-05-25 NOTE — Progress Notes (Signed)
Progress Note    Alexander Austin  OMV:[id number] DOB: Feb 25, 1946  DOA: 05/21/2020 PCP: Donald Prose, PA-C    Brief Narrative:     Medical records reviewed and are as summarized below:  Alexander Austin is an 74 y.o. male with medical history significant of afib, on Eliquis;renal cell cancer, 9/12 s/p right partial nephrectomy; stage 4 CKD; gout, CAD s/p CABG; chronic combined systolic and diastolic CHF,ischemic cardiomyopathy with an AICD;HTN;OSA; andCVApresenting with multiple joint pain and swelling.   Symptoms started 2 days ago, initially was left knee then soon progressed to right knee bilateral ankles right big toe and both feet.  Unable to stand on his feet because of severe pain involving bilateral knees.  Denies any fever chills.  Try to take some OTC without any effect.     Assessment/Plan:   Active Problems:   Gout flare   Acute gouty flareup with ambulation dysfunction -multiple joints -failed outpatient regimen of allopurinol and colchicineas outpatient -Started prednisone 40 mg daily on 6/3- finally having some improvement-- will start to slowly wean -Increased colchicine to 0.6 mg twice daily, since flareup started more than 48-hour -Pain control -Consult dietitian for recommendation of gout prevention -slow to improve- suspect can be d/c'd on 24-48 hours  Hypokalemia -replace  Hypomagnesium -P.o. replacement  History of combined systolic and diastolic CHF -continue p.o. Lasix -BP high, increase hydralazine dose  CKD 4-History of renal cell cancer -Continue p.o. Lasix  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 with 4 stentsEssential hypertension, benign -Blood pressure somewhat high, cont BP meds, increase hydralazine dosage -ContinuePlavix and Lipitor  PAF (paroxysmal atrial fibrillation) -cont Coreg and Eliquis  Major depressive disorder  - Zoloft  GERD - cont PPI  Tremor - cont  Mysoline  Obesity Body mass index is 30.3 kg/m.   Family Communication/Anticipated D/C date and plan/Code Status   DVT prophylaxis: Lovenox ordered. Code Status: partial  Disposition Plan: Status is: Inpatient  Remains inpatient appropriate because:Unsafe d/c plan   Dispo: The patient is from: Home              Anticipated d/c is to: Home              Anticipated d/c date is:1- 2 days              Patient currently is not medically stable to d/c.          Medical Consultants:    None.     Subjective:    Swelling and pain are better this AM  Objective:    Vitals:   05/24/20 1840 05/24/20 2315 05/25/20 0452 05/25/20 1150  BP: 128/62 (!) 125/94 (!) 168/72 (!) 155/85  Pulse: (!) 59 (!) 58 (!) 53 (!) 59  Resp: 16 16 16 18   Temp: (!) 97.5 F (36.4 C) 98 F (36.7 C) 97.7 F (36.5 C) 98.4 F (36.9 C)  TempSrc: Oral Oral Oral   SpO2: 98% 97% 93% 93%  Weight:      Height:        Intake/Output Summary (Last 24 hours) at 05/25/2020 1422 Last data filed at 05/25/2020 1341 Gross per 24 hour  Intake 480 ml  Output 1800 ml  Net -1320 ml   Filed Weights   05/21/20 1009  Weight: 107 kg    Exam:   Joints are less swollen and less hot today A+Ox3        Data Reviewed:   I have  personally reviewed following labs and imaging studies:  Labs: Labs show the following:   Basic Metabolic Panel: Recent Labs  Lab 05/21/20 1135 05/21/20 2000 05/22/20 0454 05/24/20 0640  NA 141  --  139 140  K 2.6*   < > 3.1* 3.1*  CL 104  --  106 108  CO2 24  --  24 24  GLUCOSE 106*  --  111* 103*  BUN 17  --  21 32*  CREATININE 1.64*  --  1.67* 1.63*  CALCIUM 8.5*  --  8.1* 8.3*   < > = values in this interval not displayed.   GFR Estimated Creatinine Clearance: 51.8 mL/min (A) (by C-G formula based on SCr of 1.63 mg/dL (H)). Liver Function Tests: Recent Labs  Lab 05/21/20 1135  AST 15  ALT 17  ALKPHOS 79  BILITOT 1.4*  PROT 5.8*  ALBUMIN 3.0*   No  results for input(s): LIPASE, AMYLASE in the last 168 hours. No results for input(s): AMMONIA in the last 168 hours. Coagulation profile No results for input(s): INR, PROTIME in the last 168 hours.  CBC: Recent Labs  Lab 05/21/20 1135 05/24/20 0640  WBC 6.2 4.3  HGB 11.3* 9.8*  HCT 35.1* 30.2*  MCV 88.4 86.8  PLT 151 177   Cardiac Enzymes: Recent Labs  Lab 05/21/20 1135  CKTOTAL 28*   BNP (last 3 results) No results for input(s): PROBNP in the last 8760 hours. CBG: No results for input(s): GLUCAP in the last 168 hours. D-Dimer: No results for input(s): DDIMER in the last 72 hours. Hgb A1c: No results for input(s): HGBA1C in the last 72 hours. Lipid Profile: No results for input(s): CHOL, HDL, LDLCALC, TRIG, CHOLHDL, LDLDIRECT in the last 72 hours. Thyroid function studies: No results for input(s): TSH, T4TOTAL, T3FREE, THYROIDAB in the last 72 hours.  Invalid input(s): FREET3 Anemia work up: No results for input(s): VITAMINB12, FOLATE, FERRITIN, TIBC, IRON, RETICCTPCT in the last 72 hours. Sepsis Labs: Recent Labs  Lab 05/21/20 1135 05/24/20 0640  WBC 6.2 4.3  LATICACIDVEN 1.3  --     Microbiology Recent Results (from the past 240 hour(s))  Culture, blood (single) w Reflex to ID Panel     Status: None (Preliminary result)   Collection Time: 05/21/20 11:33 AM   Specimen: BLOOD LEFT ARM  Result Value Ref Range Status   Specimen Description BLOOD LEFT ARM  Final   Special Requests   Final    BOTTLES DRAWN AEROBIC AND ANAEROBIC Blood Culture adequate volume   Culture   Final    NO GROWTH 4 DAYS Performed at [facility], [address] [place], [place] [address]    Report Status PENDING  Incomplete  SARS Coronavirus 2 by RT PCR (hospital order, performed in [facility] hospital lab) Nasopharyngeal Nasopharyngeal Swab     Status: None   Collection Time: 05/21/20 11:36 AM   Specimen: Nasopharyngeal Swab  Result Value Ref Range Status   SARS  Coronavirus 2 NEGATIVE NEGATIVE Final    Comment: (NOTE) SARS-CoV-2 target nucleic acids are NOT DETECTED. The SARS-CoV-2 RNA is generally detectable in upper and lower respiratory specimens during the acute phase of infection. The lowest concentration of SARS-CoV-2 viral copies this assay can detect is 250 copies / mL. A negative result does not preclude SARS-CoV-2 infection and should not be used as the sole basis for treatment or other patient management decisions.  A negative result may occur with improper specimen collection / handling, submission  of specimen other than nasopharyngeal swab, presence of viral mutation(s) within the areas targeted by this assay, and inadequate number of viral copies (<250 copies / mL). A negative result must be combined with clinical observations, patient history, and epidemiological information. Fact Sheet for Patients:   StrictlyIdeas.no Fact Sheet for Healthcare Providers: BankingDealers.co.za This test is not yet approved or cleared  by the [place] FDA and has been authorized for detection and/or diagnosis of SARS-CoV-2 by FDA under an Emergency Use Authorization (EUA).  This EUA will remain in effect (meaning this test can be used) for the duration of the COVID-19 declaration under Section 564(b)(1) of the Act, 21 U.S.C. section 360bbb-3(b)(1), unless the authorization is terminated or revoked sooner. Performed at [facility], [place] [address], [place], [place] [address]     Procedures and diagnostic studies:  No results found.  Medications:   . apixaban  5 mg Oral BID  . atorvastatin  80 mg Oral q1800  . calcitRIOL  0.25 mcg Oral Daily  . carvedilol  25 mg Oral BID WC  . clopidogrel  75 mg Oral Daily  . colchicine  0.6 mg Oral BID  . diclofenac Sodium  2 g Topical QID  . ferrous sulfate  325 mg Oral Q M,W,F  . furosemide  40 mg Oral Daily  . hydrALAZINE  75 mg Oral TID  .  ketotifen  1 drop Both Eyes BID  . mouth rinse  15 mL Mouth Rinse BID  . pantoprazole  40 mg Oral Daily  . [START ON 05/26/2020] predniSONE  30 mg Oral Q breakfast  . primidone  100 mg Oral BID  . senna-docusate  1 tablet Oral QHS  . sertraline  25 mg Oral QHS  . Vitamin D (Ergocalciferol)  50,000 Units Oral Q Sun   Continuous Infusions:   LOS: 4 days   Geradine Girt  [facility]   How to contact the [facility] Attending or Consulting provider [place] or covering provider during after hours [place], for this patient?  1. Check the care team in [facility] and look for a) attending/consulting [facility] provider listed and b) the [facility] team listed 2. Log into [email or web address] and use [place]'s universal password to access. If you do not have the password, please contact the hospital operator. 3. Locate the [facility] provider you are looking for under [facility] and page to a number that you can be directly reached. 4. If you still have difficulty reaching the provider, please page the [facility] (Director on Call) for the Hospitalists listed on amion for assistance.  05/25/2020, 2:22 PM

## 2020-05-26 LAB — CULTURE, BLOOD (SINGLE)
Culture: NO GROWTH
Special Requests: ADEQUATE

## 2020-05-26 MED ORDER — COLCHICINE 0.6 MG PO TABS: 0.6000 mg | ORAL_TABLET | Freq: Two times a day (BID) | ORAL | 0 refills | Status: DC

## 2020-05-26 MED ORDER — DICLOFENAC SODIUM 1 % EX GEL: 2.0000 g | Freq: Four times a day (QID) | CUTANEOUS | 0 refills | Status: DC

## 2020-05-26 MED ORDER — PREDNISONE 10 MG PO TABS: ORAL_TABLET | ORAL | 0 refills | Status: DC

## 2020-05-26 NOTE — Discharge Summary (Signed)
Physician Discharge Summary  Alexander Austin DXA:[id number] DOB: 24-Apr-1946 DOA: 05/21/2020  PCP: Donald Prose, PA-C  Admit date: 05/21/2020 Discharge date: 05/26/2020  Admitted From: Home Discharge disposition: home   Recommendations for Outpatient Follow-Up:   1. Needs further outpatient management of his gout and arthritis 2. Resumed home health   Discharge Diagnosis:   Active Problems:   Gout flare    Discharge Condition: Improved.  Diet recommendation: Low purine diet  Wound care: None.  Code status: Full.   History of Present Illness:   Alexander Austin is a 74 y.o. male with medical history significant of afib, on Eliquis;renal cell cancer, 9/12 s/p right partial nephrectomy; stage 4 CKD; gout, CAD s/p CABG; chronic combined systolic and diastolic CHF,ischemic cardiomyopathy with an AICD;HTN;OSA; andCVApresenting with multiple joint pain and swelling.   Symptoms started 2 days ago, initially was left knee then soon progressed to right knee bilateral ankles right big toe and both feet.  Unable to stand on his feet because of severe pain involving bilateral knees.  Denies any fever chills.  Try to take some OTC without any effect.     Hospital Course by Problem:   Acute gouty flareup with ambulation dysfunction -multiple joints -failedoutpatient regimen of allopurinol and colchicineas outpatient -Started prednisone 40 mg daily on 6/3- finally having some improvement on 6/6-- will start to slowly wean -Increased colchicine to 0.6 mg twice daily, since flareup started more than 48-hour -Pain control -diet recommended for gout prevention - will  Need close PCP follow up  Hypokalemia -replace  Hypomagnesium -P.o. replacement  History of combined systolic and diastolic CHF -continue p.o. Lasix -BP high, increase hydralazine dose  CKD 4-History of renal cell cancer -Continue p.o.Lasix  Coronary artery disease involving coronary bypass  graft of native heart with 4 stentsEssential hypertension, benign -Blood pressure somewhat high,cont BP meds, increasehydralazine dosage -ContinuePlavix and Lipitor  PAF (paroxysmal atrial fibrillation) -cont Coreg and Eliquis  Major depressive disorder  - Zoloft  GERD - cont PPI  Tremor - cont Mysoline  Obesity Body mass index is 30.3 kg/m.    Medical Consultants:      Discharge Exam:   Vitals:   05/25/20 2200 05/26/20 0523  BP: 118/66 (!) 165/66  Pulse: (!) 58 (!) 52  Resp:  18  Temp: 97.8 F (36.6 C) 98.8 F (37.1 C)  SpO2: 98% 95%   Vitals:   05/25/20 1150 05/25/20 1830 05/25/20 2200 05/26/20 0523  BP: (!) 155/85 (!) 157/64 118/66 (!) 165/66  Pulse: (!) 59 60 (!) 58 (!) 52  Resp: 18 19  18   Temp: 98.4 F (36.9 C) 97.7 F (36.5 C) 97.8 F (36.6 C) 98.8 F (37.1 C)  TempSrc:  Oral Oral Oral  SpO2: 93% 100% 98% 95%  Weight:      Height:        General exam: Appears calm and comfortable.   The results of significant diagnostics from this hospitalization (including imaging, microbiology, ancillary and laboratory) are listed below for reference.     Procedures and Diagnostic Studies:   DG Chest 1 View  Result Date: 05/21/2020 CLINICAL DATA:  Pain, no trauma EXAM: CHEST  1 VIEW COMPARISON:  03/28/2020 FINDINGS: Cardiomegaly status post median sternotomy and CABG with left chest multi lead pacer defibrillator. Both lungs are clear. Large, densely calcified nodule of the left upper lobe. The visualized skeletal structures are unremarkable. IMPRESSION: Cardiomegaly without acute abnormality of the lungs in AP portable projection.  Electronically Signed   By: Eddie Candle M.D.   On: 05/21/2020 13:08   DG Tibia/Fibula Right  Result Date: 05/21/2020 CLINICAL DATA:  Right leg pain. EXAM: RIGHT TIBIA AND FIBULA - 2 VIEW COMPARISON:  None. FINDINGS: The knee and ankle joints are maintained. Moderate degenerative changes. The tibia and fibula are  intact. No stress fracture or bone lesion. Surgical changes noted from vein harvesting. IMPRESSION: No acute bony findings. Electronically Signed   By: Marijo Sanes M.D.   On: 05/21/2020 13:11   DG Knee Complete 4 Views Right  Result Date: 05/21/2020 CLINICAL DATA:  Right knee pain. EXAM: RIGHT KNEE - COMPLETE 4+ VIEW COMPARISON:  None. FINDINGS: Mild tricompartmental degenerative changes but no acute bony findings, osteochondral lesion or chondrocalcinosis. No definite joint effusion. IMPRESSION: Mild tricompartmental degenerative changes but no acute bony findings or joint effusion. Electronically Signed   By: Marijo Sanes M.D.   On: 05/21/2020 13:10   DG Foot 2 Views Right  Result Date: 05/21/2020 CLINICAL DATA:  Right foot pain. EXAM: RIGHT FOOT - 2 VIEW COMPARISON:  None. FINDINGS: Moderate degenerative changes but no acute bony findings or erosions. Mild pes cavus. IMPRESSION: Moderate degenerative changes but no acute bony findings. Electronically Signed   By: Marijo Sanes M.D.   On: 05/21/2020 13:09   VAS [place] LOWER EXTREMITY VENOUS (DVT) (ONLY [facility] & [facility])  Result Date: 05/21/2020  Lower Venous DVTStudy Indications: Swelling.  Comparison Study: No prior study Performing Technologist: Maudry Mayhew MHA, RDMS, RVT, RDCS  Examination Guidelines: A complete evaluation includes B-mode imaging, spectral Doppler, color Doppler, and power Doppler as needed of all accessible portions of each vessel. Bilateral testing is considered an integral part of a complete examination. Limited examinations for reoccurring indications may be performed as noted. The reflux portion of the exam is performed with the patient in reverse Trendelenburg.  +---------+---------------+---------+-----------+----------+--------------+ RIGHT    CompressibilityPhasicitySpontaneityPropertiesThrombus Aging +---------+---------------+---------+-----------+----------+--------------+ CFV      Full           Yes      Yes                                  +---------+---------------+---------+-----------+----------+--------------+ FV Prox  Full                                                        +---------+---------------+---------+-----------+----------+--------------+ FV Mid   Full                                                        +---------+---------------+---------+-----------+----------+--------------+ FV DistalFull                                                        +---------+---------------+---------+-----------+----------+--------------+ PFV      Full                                                        +---------+---------------+---------+-----------+----------+--------------+  POP      Full           Yes      Yes                                 +---------+---------------+---------+-----------+----------+--------------+ PTV      Full                                                        +---------+---------------+---------+-----------+----------+--------------+ PERO     Full                                                        +---------+---------------+---------+-----------+----------+--------------+   Right Technical Findings: Not visualized segments include SFJ.  +---------+---------------+---------+-----------+----------+--------------+ LEFT     CompressibilityPhasicitySpontaneityPropertiesThrombus Aging +---------+---------------+---------+-----------+----------+--------------+ CFV      Full           Yes      Yes                                 +---------+---------------+---------+-----------+----------+--------------+ FV Prox  Full                                                        +---------+---------------+---------+-----------+----------+--------------+ FV Mid   Full                                                        +---------+---------------+---------+-----------+----------+--------------+ FV DistalFull                                                         +---------+---------------+---------+-----------+----------+--------------+ POP      Full           Yes      Yes                                 +---------+---------------+---------+-----------+----------+--------------+ PTV      Full                                                        +---------+---------------+---------+-----------+----------+--------------+ PERO     Full                                                        +---------+---------------+---------+-----------+----------+--------------+  Left Technical Findings: Not visualized segments include SFJ, PFV.   Summary: RIGHT: - There is no evidence of deep vein thrombosis in the lower extremity. However, portions of this examination were limited- see technologist comments above.  - No cystic structure found in the popliteal fossa.  LEFT: - There is no evidence of deep vein thrombosis in the lower extremity. However, portions of this examination were limited- see technologist comments above.  - No cystic structure found in the popliteal fossa.  *See table(s) above for measurements and observations. Electronically signed by Curt Jews MD on 05/21/2020 at 5:20:08 PM.    Final      Labs:   Basic Metabolic Panel: Recent Labs  Lab 05/21/20 1135 05/21/20 2000 05/22/20 0454 05/24/20 0640  NA 141  --  139 140  K 2.6*   < > 3.1* 3.1*  CL 104  --  106 108  CO2 24  --  24 24  GLUCOSE 106*  --  111* 103*  BUN 17  --  21 32*  CREATININE 1.64*  --  1.67* 1.63*  CALCIUM 8.5*  --  8.1* 8.3*   < > = values in this interval not displayed.   GFR Estimated Creatinine Clearance: 51.8 mL/min (A) (by C-G formula based on SCr of 1.63 mg/dL (H)). Liver Function Tests: Recent Labs  Lab 05/21/20 1135  AST 15  ALT 17  ALKPHOS 79  BILITOT 1.4*  PROT 5.8*  ALBUMIN 3.0*   No results for input(s): LIPASE, AMYLASE in the last 168 hours. No results for input(s): AMMONIA in the last 168  hours. Coagulation profile No results for input(s): INR, PROTIME in the last 168 hours.  CBC: Recent Labs  Lab 05/21/20 1135 05/24/20 0640  WBC 6.2 4.3  HGB 11.3* 9.8*  HCT 35.1* 30.2*  MCV 88.4 86.8  PLT 151 177   Cardiac Enzymes: Recent Labs  Lab 05/21/20 1135  CKTOTAL 28*   BNP: Invalid input(s): POCBNP CBG: No results for input(s): GLUCAP in the last 168 hours. D-Dimer No results for input(s): DDIMER in the last 72 hours. Hgb A1c No results for input(s): HGBA1C in the last 72 hours. Lipid Profile No results for input(s): CHOL, HDL, LDLCALC, TRIG, CHOLHDL, LDLDIRECT in the last 72 hours. Thyroid function studies No results for input(s): TSH, T4TOTAL, T3FREE, THYROIDAB in the last 72 hours.  Invalid input(s): FREET3 Anemia work up No results for input(s): VITAMINB12, FOLATE, FERRITIN, TIBC, IRON, RETICCTPCT in the last 72 hours. Microbiology Recent Results (from the past 240 hour(s))  Culture, blood (single) w Reflex to ID Panel     Status: None   Collection Time: 05/21/20 11:33 AM   Specimen: BLOOD LEFT ARM  Result Value Ref Range Status   Specimen Description BLOOD LEFT ARM  Final   Special Requests   Final    BOTTLES DRAWN AEROBIC AND ANAEROBIC Blood Culture adequate volume   Culture   Final    NO GROWTH 5 DAYS Performed at [facility], [address] [place], [place] [address]    Report Status 05/26/2020 FINAL  Final  SARS Coronavirus 2 by RT PCR (hospital order, performed in [facility] hospital lab) Nasopharyngeal Nasopharyngeal Swab     Status: None   Collection Time: 05/21/20 11:36 AM   Specimen: Nasopharyngeal Swab  Result Value Ref Range Status   SARS Coronavirus 2 NEGATIVE NEGATIVE Final    Comment: (NOTE) SARS-CoV-2 target nucleic acids are NOT DETECTED. The SARS-CoV-2 RNA is generally detectable in  upper and lower respiratory specimens during the acute phase of infection. The lowest concentration of SARS-CoV-2 viral copies this assay  can detect is 250 copies / mL. A negative result does not preclude SARS-CoV-2 infection and should not be used as the sole basis for treatment or other patient management decisions.  A negative result may occur with improper specimen collection / handling, submission of specimen other than nasopharyngeal swab, presence of viral mutation(s) within the areas targeted by this assay, and inadequate number of viral copies (<250 copies / mL). A negative result must be combined with clinical observations, patient history, and epidemiological information. Fact Sheet for Patients:   StrictlyIdeas.no Fact Sheet for Healthcare Providers: BankingDealers.co.za This test is not yet approved or cleared  by the [place] FDA and has been authorized for detection and/or diagnosis of SARS-CoV-2 by FDA under an Emergency Use Authorization (EUA).  This EUA will remain in effect (meaning this test can be used) for the duration of the COVID-19 declaration under Section 564(b)(1) of the Act, 21 U.S.C. section 360bbb-3(b)(1), unless the authorization is terminated or revoked sooner. Performed at [facility], [place] [address], [place], [place] [address]      Discharge Instructions:   Discharge Instructions    Diet - low sodium heart healthy   Complete by: As directed    Discharge instructions   Complete by: As directed    Home health   Increase activity slowly   Complete by: As directed      Allergies as of 05/26/2020      Reactions   Isosorbide Nitrate Anaphylaxis   Other reaction(s): Cardiovascular Arrest (ALLERGY/intolerance) Can take Sublingual Nitro.   Shellfish-derived Products Other (See Comments)   Patient states shellfish triggers his gout      Medication List    STOP taking these medications   allopurinol 100 MG tablet Commonly known as: ZYLOPRIM   nitroGLYCERIN 0.4 MG SL tablet Commonly known as: NITROSTAT   senna-docusate  8.6-50 MG tablet Commonly known as: Senokot-S     TAKE these medications   amLODipine 10 MG tablet Commonly known as: NORVASC Take 10 mg by mouth daily.   apixaban 5 MG Tabs tablet Commonly known as: ELIQUIS Take 5 mg by mouth 2 (two) times daily.   aspirin EC 81 MG tablet Take 81 mg by mouth daily.   atorvastatin 80 MG tablet Commonly known as: LIPITOR Take 80 mg by mouth daily.   calcitRIOL 0.25 MCG capsule Commonly known as: ROCALTROL Take 0.25 mcg by mouth daily.   carvedilol 25 MG tablet Commonly known as: COREG Take 25 mg by mouth in the morning and at bedtime.   clopidogrel 75 MG tablet Commonly known as: PLAVIX Take 75 mg by mouth daily.   colchicine 0.6 MG tablet Take 1 tablet (0.6 mg total) by mouth 2 (two) times daily. What changed:   how much to take  when to take this   diclofenac Sodium 1 % Gel Commonly known as: VOLTAREN Apply 2 g topically 4 (four) times daily.   ferrous sulfate 325 (65 FE) MG tablet Take 325 mg by mouth every Monday, Wednesday, and Friday.   furosemide 40 MG tablet Commonly known as: LASIX Take 1 tablet (40 mg total) by mouth daily. What changed: when to take this   hydrALAZINE 50 MG tablet Commonly known as: APRESOLINE Take 1 tablet (50 mg total) by mouth 3 (three) times daily.   ketotifen 0.025 % ophthalmic solution Commonly known as: ZADITOR Place  1 drop into both eyes 2 (two) times daily.   pantoprazole 40 MG tablet Commonly known as: PROTONIX Take 40 mg by mouth daily.   predniSONE 10 MG tablet Commonly known as: DELTASONE 30mg  x 3 days then 20 mg x 3 days then 10 mg x 3 days then stop What changed: additional instructions   primidone 50 MG tablet Commonly known as: MYSOLINE Take 100 mg by mouth in the morning and at bedtime.   sertraline 50 MG tablet Commonly known as: ZOLOFT Take 25 mg by mouth at bedtime.   Vitamin D (Ergocalciferol) 1.25 MG (50000 UNIT) Caps capsule Commonly known as:  DRISDOL Take 50,000 Units by mouth every 7 (seven) days. Sunday            Durable Medical Equipment  (From admission, onward)         Start     Ordered   05/23/20 1211  For home use only DME lightweight manual wheelchair with seat cushion  Once    Comments: Patient suffers from Acute gouty flareup with ambulation dysfunction which impairs their ability to perform daily activities like ambulating  in the home.  A cane  will not resolve  issue with performing activities of daily living. A wheelchair will allow patient to safely perform daily activities. Patient is not able to propel themselves in the home using a standard weight wheelchair due to Acute gouty flareup with ambulation dysfunction . Patient can self propel in the lightweight wheelchair. Length of need 3 months . Accessories: elevating leg rests (ELRs), wheel locks, extensions and anti-tippers.  Seat and back cushions   05/23/20 1211         Follow-up Information    Costello, Tana Conch, PA-C Follow up in 1 week(s).   Specialty: Cardiology Contact information: [address] [place] [address] [phone]            Time coordinating discharge: 45 min  Signed:  Geradine Girt DO  [facility] 05/26/2020, 7:49 AM

## 2020-05-26 NOTE — Progress Notes (Signed)
Discharge instructions reviewed with pt.  Copy of instructions given to pt. Scripts sent to pt's pharmacy electronically by MD, pt informed.  Pt d/c'd via wheelchair with belongings, with family at main entrance.           Escorted by hospital volunteer.

## 2020-05-26 NOTE — Progress Notes (Signed)
Physical Therapy Treatment Patient Details Name: Alexander Austin MRN: [id number] DOB: [birth year] Today's Date: 05/26/2020    History of Present Illness Pt is a 74 y/o male admitted secondary to multiple joint pain, found to have a gouty flare. PMh including but not limited to afib, on Eliquis; renal cell cancer, 9/12 s/p right partial nephrectomy; stage 4 CKD; gout, CAD s/p CABG; chronic combined systolic and diastolic CHF,  ischemic cardiomyopathy with an AICD; HTN; OSA; and CVA.    PT Comments    Continuing work on functional mobility and activity tolerance;  Excellent improvement in functional mobility as the gout flare was treated and died down; able to manage walking household distance without difficulty with RW; OK for dc home from PT standpoint   Follow Up Recommendations  Home health PT     Equipment Recommendations  Wheelchair (measurements PT);Wheelchair cushion (measurements PT)    Recommendations for Other Services       Precautions / Restrictions Precautions Precautions: Fall Precaution Comments: Fall risk greatly reduced    Mobility  Bed Mobility Overal bed mobility: Needs Assistance Bed Mobility: Supine to Sit     Supine to sit: Supervision     General bed mobility comments: incr time, but no overt difficulty  Transfers Overall transfer level: Needs assistance Equipment used: Rolling walker (2 wheeled) Transfers: Sit to/from Stand Sit to Stand: Supervision         General transfer comment: Cues for hand placement and safety; did not need physical assist  Ambulation/Gait Ambulation/Gait assistance: Min guard;Supervision Gait Distance (Feet): 120 Feet Assistive device: Rolling walker (2 wheeled) Gait Pattern/deviations: Step-through pattern;Decreased step length - right;Decreased step length - left;Trunk flexed     General Gait Details: Reporting no pain in LEs with today's walk; Good use of RW for stability   Stairs             Wheelchair  Mobility    Modified Rankin (Stroke Patients Only)       Balance     Sitting balance-Leahy Scale: Good                                      Cognition Arousal/Alertness: Awake/alert Behavior During Therapy: WFL for tasks assessed/performed Overall Cognitive Status: Within Functional Limits for tasks assessed                                        Exercises      General Comments        Pertinent Vitals/Pain Pain Assessment: Faces Faces Pain Scale: No hurt Pain Intervention(s): Monitored during session    Home Living                      Prior Function            PT Goals (current goals can now be found in the care plan section) Acute Rehab PT Goals Patient Stated Goal: decrease pain PT Goal Formulation: With patient Time For Goal Achievement: 06/05/20 Potential to Achieve Goals: Good Progress towards PT goals: Progressing toward goals    Frequency    Min 3X/week      PT Plan Discharge plan needs to be updated(excellent improvement)    Co-evaluation              AM-PAC PT "  6 Clicks" Mobility   Outcome Measure  Help needed turning from your back to your side while in a flat bed without using bedrails?: None Help needed moving from lying on your back to sitting on the side of a flat bed without using bedrails?: None Help needed moving to and from a bed to a chair (including a wheelchair)?: None Help needed standing up from a chair using your arms (e.g., wheelchair or bedside chair)?: None Help needed to walk in hospital room?: None Help needed climbing 3-5 steps with a railing? : A Little 6 Click Score: 23    End of Session Equipment Utilized During Treatment: Gait belt Activity Tolerance: Patient tolerated treatment well Patient left: in chair;with call bell/phone within reach Nurse Communication: Mobility status PT Visit Diagnosis: Other abnormalities of gait and mobility (R26.89);Pain Pain - part  of body: Leg;Knee;Ankle and joints of foot     Time: 0110-0349 PT Time Calculation (min) (ACUTE ONLY): 18 min  Charges:  $Gait Training: 8-22 mins                     Alexander Austin, [place]  Acute Rehabilitation Services Pager [phone] Office [phone]    Alexander Austin 05/26/2020, 4:14 PM

## 2020-05-26 NOTE — Care Management (Signed)
[name] with [facility] aware patient discharged today.   Alexander Austin

## 2020-07-02 ENCOUNTER — Inpatient Hospital Stay (HOSPITAL_COMMUNITY)
Admission: EM | Admit: 2020-07-02 | Discharge: 2020-07-07 | DRG: 554 | Disposition: A | Discharge status: Home Health | Payer: No Typology Code available for payment source | Attending: Family Medicine | Admitting: Family Medicine

## 2020-07-02 ENCOUNTER — Emergency Department (HOSPITAL_COMMUNITY): Payer: No Typology Code available for payment source

## 2020-07-02 ENCOUNTER — Encounter (HOSPITAL_COMMUNITY): Payer: Self-pay | Admitting: Emergency Medicine

## 2020-07-02 DIAGNOSIS — Z7902 Long term (current) use of antithrombotics/antiplatelets: Secondary | ICD-10-CM

## 2020-07-02 DIAGNOSIS — Z8673 Personal history of transient ischemic attack (TIA), and cerebral infarction without residual deficits: Secondary | ICD-10-CM

## 2020-07-02 DIAGNOSIS — Z20822 Contact with and (suspected) exposure to covid-19: Secondary | ICD-10-CM | POA: Diagnosis present

## 2020-07-02 DIAGNOSIS — Z7982 Long term (current) use of aspirin: Secondary | ICD-10-CM

## 2020-07-02 DIAGNOSIS — N1831 Chronic kidney disease, stage 3a: Secondary | ICD-10-CM | POA: Diagnosis present

## 2020-07-02 DIAGNOSIS — I480 Paroxysmal atrial fibrillation: Secondary | ICD-10-CM | POA: Diagnosis present

## 2020-07-02 DIAGNOSIS — M10072 Idiopathic gout, left ankle and foot: Principal | ICD-10-CM | POA: Diagnosis present

## 2020-07-02 DIAGNOSIS — I5032 Chronic diastolic (congestive) heart failure: Secondary | ICD-10-CM | POA: Diagnosis present

## 2020-07-02 DIAGNOSIS — Z79899 Other long term (current) drug therapy: Secondary | ICD-10-CM

## 2020-07-02 DIAGNOSIS — Z791 Long term (current) use of non-steroidal anti-inflammatories (NSAID): Secondary | ICD-10-CM

## 2020-07-02 DIAGNOSIS — I25708 Atherosclerosis of coronary artery bypass graft(s), unspecified, with other forms of angina pectoris: Secondary | ICD-10-CM | POA: Diagnosis present

## 2020-07-02 DIAGNOSIS — M10071 Idiopathic gout, right ankle and foot: Secondary | ICD-10-CM | POA: Diagnosis present

## 2020-07-02 DIAGNOSIS — N400 Enlarged prostate without lower urinary tract symptoms: Secondary | ICD-10-CM | POA: Diagnosis present

## 2020-07-02 DIAGNOSIS — I10 Essential (primary) hypertension: Secondary | ICD-10-CM | POA: Diagnosis present

## 2020-07-02 DIAGNOSIS — I130 Hypertensive heart and chronic kidney disease with heart failure and stage 1 through stage 4 chronic kidney disease, or unspecified chronic kidney disease: Secondary | ICD-10-CM | POA: Diagnosis present

## 2020-07-02 DIAGNOSIS — E785 Hyperlipidemia, unspecified: Secondary | ICD-10-CM | POA: Diagnosis present

## 2020-07-02 DIAGNOSIS — I2510 Atherosclerotic heart disease of native coronary artery without angina pectoris: Secondary | ICD-10-CM | POA: Diagnosis present

## 2020-07-02 DIAGNOSIS — Z7901 Long term (current) use of anticoagulants: Secondary | ICD-10-CM

## 2020-07-02 DIAGNOSIS — M109 Gout, unspecified: Secondary | ICD-10-CM | POA: Diagnosis present

## 2020-07-02 DIAGNOSIS — M10079 Idiopathic gout, unspecified ankle and foot: Secondary | ICD-10-CM

## 2020-07-02 DIAGNOSIS — Z87891 Personal history of nicotine dependence: Secondary | ICD-10-CM

## 2020-07-02 DIAGNOSIS — I252 Old myocardial infarction: Secondary | ICD-10-CM

## 2020-07-02 LAB — CBC WITH DIFFERENTIAL/PLATELET
Abs Immature Granulocytes: 0.02 10*3/uL (ref 0.00–0.07)
Basophils Absolute: 0 10*3/uL (ref 0.0–0.1)
Basophils Relative: 0 %
Eosinophils Absolute: 0 10*3/uL (ref 0.0–0.5)
Eosinophils Relative: 1 %
HCT: 40.2 % (ref 39.0–52.0)
Hemoglobin: 12.7 g/dL — ABNORMAL LOW (ref 13.0–17.0)
Immature Granulocytes: 0 %
Lymphocytes Relative: 11 %
Lymphs Abs: 0.6 10*3/uL — ABNORMAL LOW (ref 0.7–4.0)
MCH: 27.5 pg (ref 26.0–34.0)
MCHC: 31.6 g/dL (ref 30.0–36.0)
MCV: 87.2 fL (ref 80.0–100.0)
Monocytes Absolute: 0.4 10*3/uL (ref 0.1–1.0)
Monocytes Relative: 7 %
Neutro Abs: 4.4 10*3/uL (ref 1.7–7.7)
Neutrophils Relative %: 81 %
Platelets: 186 10*3/uL (ref 150–400)
RBC: 4.61 MIL/uL (ref 4.22–5.81)
RDW: 15.6 % — ABNORMAL HIGH (ref 11.5–15.5)
WBC: 5.6 10*3/uL (ref 4.0–10.5)
nRBC: 0 % (ref 0.0–0.2)

## 2020-07-02 LAB — BASIC METABOLIC PANEL
Anion gap: 11 (ref 5–15)
BUN: 16 mg/dL (ref 8–23)
CO2: 25 mmol/L (ref 22–32)
Calcium: 9.1 mg/dL (ref 8.9–10.3)
Chloride: 105 mmol/L (ref 98–111)
Creatinine, Ser: 1.5 mg/dL — ABNORMAL HIGH (ref 0.61–1.24)
GFR calc Af Amer: 52 mL/min — ABNORMAL LOW (ref >60)
GFR calc non Af Amer: 45 mL/min — ABNORMAL LOW (ref >60)
Glucose, Bld: 98 mg/dL (ref 70–99)
Potassium: 3.8 mmol/L (ref 3.5–5.1)
Sodium: 141 mmol/L (ref 135–145)

## 2020-07-02 NOTE — ED Triage Notes (Signed)
Patient reports "gout" flare up to BLE. States he has taken Rx meds with no relief.

## 2020-07-02 NOTE — ED Provider Notes (Signed)
Patient placed in Quick Look pathway, seen and evaluated   Chief Complaint: Foot pain  HPI:   Bilateral foot pain for last several days. No fever, numbness, injury. States last time he had this issue he had bilateral gout and was admitted.   ROS: Bilateral foot pain (one)  Physical Exam:   Gen: No distress  Neuro: Awake and Alert  Skin: Warm    Focused Exam:   Tenderness, swelling, and increased warmth to bilateral MTP joints of the first digits.  No swelling, pain, tenderness to ankle or to rest of lower extremities.    Initiation of care has begun. The patient has been counseled on the process, plan, and necessity for staying for the completion/evaluation, and the remainder of the medical screening examination   Layla Maw 07/02/20 [place], [place], DO 07/02/20 1827

## 2020-07-03 ENCOUNTER — Emergency Department (HOSPITAL_COMMUNITY): Payer: No Typology Code available for payment source

## 2020-07-03 ENCOUNTER — Encounter (HOSPITAL_COMMUNITY): Payer: Self-pay | Admitting: Family Medicine

## 2020-07-03 DIAGNOSIS — I130 Hypertensive heart and chronic kidney disease with heart failure and stage 1 through stage 4 chronic kidney disease, or unspecified chronic kidney disease: Secondary | ICD-10-CM | POA: Diagnosis present

## 2020-07-03 DIAGNOSIS — N1831 Chronic kidney disease, stage 3a: Secondary | ICD-10-CM | POA: Diagnosis present

## 2020-07-03 DIAGNOSIS — I2510 Atherosclerotic heart disease of native coronary artery without angina pectoris: Secondary | ICD-10-CM | POA: Diagnosis present

## 2020-07-03 DIAGNOSIS — I5032 Chronic diastolic (congestive) heart failure: Secondary | ICD-10-CM

## 2020-07-03 DIAGNOSIS — I10 Essential (primary) hypertension: Secondary | ICD-10-CM

## 2020-07-03 DIAGNOSIS — Z7982 Long term (current) use of aspirin: Secondary | ICD-10-CM | POA: Diagnosis not present

## 2020-07-03 DIAGNOSIS — M109 Gout, unspecified: Secondary | ICD-10-CM | POA: Diagnosis present

## 2020-07-03 DIAGNOSIS — I25708 Atherosclerosis of coronary artery bypass graft(s), unspecified, with other forms of angina pectoris: Secondary | ICD-10-CM

## 2020-07-03 DIAGNOSIS — Z20822 Contact with and (suspected) exposure to covid-19: Secondary | ICD-10-CM | POA: Diagnosis present

## 2020-07-03 DIAGNOSIS — Z87891 Personal history of nicotine dependence: Secondary | ICD-10-CM | POA: Diagnosis not present

## 2020-07-03 DIAGNOSIS — I252 Old myocardial infarction: Secondary | ICD-10-CM | POA: Diagnosis not present

## 2020-07-03 DIAGNOSIS — M10072 Idiopathic gout, left ankle and foot: Secondary | ICD-10-CM | POA: Diagnosis present

## 2020-07-03 DIAGNOSIS — Z7901 Long term (current) use of anticoagulants: Secondary | ICD-10-CM | POA: Diagnosis not present

## 2020-07-03 DIAGNOSIS — E785 Hyperlipidemia, unspecified: Secondary | ICD-10-CM | POA: Diagnosis present

## 2020-07-03 DIAGNOSIS — Z7902 Long term (current) use of antithrombotics/antiplatelets: Secondary | ICD-10-CM | POA: Diagnosis not present

## 2020-07-03 DIAGNOSIS — Z791 Long term (current) use of non-steroidal anti-inflammatories (NSAID): Secondary | ICD-10-CM | POA: Diagnosis not present

## 2020-07-03 DIAGNOSIS — I480 Paroxysmal atrial fibrillation: Secondary | ICD-10-CM | POA: Diagnosis present

## 2020-07-03 DIAGNOSIS — N400 Enlarged prostate without lower urinary tract symptoms: Secondary | ICD-10-CM | POA: Diagnosis present

## 2020-07-03 DIAGNOSIS — Z8673 Personal history of transient ischemic attack (TIA), and cerebral infarction without residual deficits: Secondary | ICD-10-CM | POA: Diagnosis not present

## 2020-07-03 DIAGNOSIS — Z79899 Other long term (current) drug therapy: Secondary | ICD-10-CM | POA: Diagnosis not present

## 2020-07-03 DIAGNOSIS — M10071 Idiopathic gout, right ankle and foot: Secondary | ICD-10-CM | POA: Diagnosis present

## 2020-07-03 LAB — URIC ACID: Uric Acid, Serum: 11.7 mg/dL — ABNORMAL HIGH (ref 3.7–8.6)

## 2020-07-03 LAB — SARS CORONAVIRUS 2 BY RT PCR (HOSPITAL ORDER, PERFORMED IN ~~LOC~~ HOSPITAL LAB): SARS Coronavirus 2: NEGATIVE

## 2020-07-03 LAB — BRAIN NATRIURETIC PEPTIDE: B Natriuretic Peptide: 160 pg/mL — ABNORMAL HIGH (ref 0.0–100.0)

## 2020-07-03 LAB — TROPONIN I (HIGH SENSITIVITY): Troponin I (High Sensitivity): 13 ng/L (ref <18)

## 2020-07-03 MED ORDER — COLCHICINE 0.3 MG HALF TABLET
0.3000 mg | ORAL_TABLET | Freq: Once | ORAL | Status: AC
  Administered 2020-07-03: 0.3 mg via ORAL
  Filled 2020-07-03: qty 1

## 2020-07-03 MED ORDER — APIXABAN 5 MG PO TABS
5.0000 mg | ORAL_TABLET | Freq: Two times a day (BID) | ORAL | Status: DC
  Administered 2020-07-03 – 2020-07-07 (×8): 5 mg via ORAL
  Filled 2020-07-03 (×10): qty 1

## 2020-07-03 MED ORDER — CALCITRIOL 0.25 MCG PO CAPS
0.2500 ug | ORAL_CAPSULE | Freq: Every day | ORAL | Status: DC
  Administered 2020-07-04 – 2020-07-07 (×4): 0.25 ug via ORAL
  Filled 2020-07-03 (×5): qty 1

## 2020-07-03 MED ORDER — SODIUM CHLORIDE 0.9% FLUSH: 3.0000 mL | INTRAVENOUS | Status: DC | PRN

## 2020-07-03 MED ORDER — ACETAMINOPHEN 650 MG RE SUPP: 650.0000 mg | Freq: Four times a day (QID) | RECTAL | Status: DC | PRN

## 2020-07-03 MED ORDER — PRIMIDONE 50 MG PO TABS
100.0000 mg | ORAL_TABLET | Freq: Two times a day (BID) | ORAL | Status: DC
  Administered 2020-07-03 – 2020-07-07 (×8): 100 mg via ORAL
  Filled 2020-07-03 (×10): qty 2

## 2020-07-03 MED ORDER — ONDANSETRON HCL 4 MG/2ML IJ SOLN: 4.0000 mg | Freq: Four times a day (QID) | INTRAMUSCULAR | Status: DC | PRN

## 2020-07-03 MED ORDER — SODIUM CHLORIDE 0.9 % IV SOLN: 250.0000 mL | INTRAVENOUS | Status: DC | PRN

## 2020-07-03 MED ORDER — PANTOPRAZOLE SODIUM 40 MG PO TBEC
40.0000 mg | DELAYED_RELEASE_TABLET | Freq: Every day | ORAL | Status: DC
  Administered 2020-07-04 – 2020-07-07 (×4): 40 mg via ORAL
  Filled 2020-07-03 (×4): qty 1

## 2020-07-03 MED ORDER — SERTRALINE HCL 25 MG PO TABS
25.0000 mg | ORAL_TABLET | Freq: Every day | ORAL | Status: DC
  Administered 2020-07-03 – 2020-07-06 (×4): 25 mg via ORAL
  Filled 2020-07-03 (×5): qty 1

## 2020-07-03 MED ORDER — AMLODIPINE BESYLATE 10 MG PO TABS
10.0000 mg | ORAL_TABLET | Freq: Every day | ORAL | Status: DC
  Administered 2020-07-04 – 2020-07-07 (×4): 10 mg via ORAL
  Filled 2020-07-03 (×4): qty 1

## 2020-07-03 MED ORDER — METHYLPREDNISOLONE SODIUM SUCC 125 MG IJ SOLR
60.0000 mg | Freq: Two times a day (BID) | INTRAMUSCULAR | Status: DC
  Administered 2020-07-03 – 2020-07-05 (×4): 60 mg via INTRAVENOUS
  Filled 2020-07-03 (×4): qty 2

## 2020-07-03 MED ORDER — ATORVASTATIN CALCIUM 80 MG PO TABS
80.0000 mg | ORAL_TABLET | Freq: Every day | ORAL | Status: DC
  Administered 2020-07-04 – 2020-07-07 (×4): 80 mg via ORAL
  Filled 2020-07-03 (×4): qty 1

## 2020-07-03 MED ORDER — ASPIRIN EC 81 MG PO TBEC
81.0000 mg | DELAYED_RELEASE_TABLET | Freq: Every day | ORAL | Status: DC
  Administered 2020-07-04 – 2020-07-07 (×4): 81 mg via ORAL
  Filled 2020-07-03 (×4): qty 1

## 2020-07-03 MED ORDER — SODIUM CHLORIDE 0.9% FLUSH
3.0000 mL | Freq: Two times a day (BID) | INTRAVENOUS | Status: DC
  Administered 2020-07-03 – 2020-07-07 (×8): 3 mL via INTRAVENOUS

## 2020-07-03 MED ORDER — HYDROMORPHONE HCL 1 MG/ML IJ SOLN: 0.5000 mg | INTRAMUSCULAR | Status: DC | PRN

## 2020-07-03 MED ORDER — HYDRALAZINE HCL 50 MG PO TABS
50.0000 mg | ORAL_TABLET | Freq: Three times a day (TID) | ORAL | Status: DC
  Administered 2020-07-04 – 2020-07-07 (×10): 50 mg via ORAL
  Filled 2020-07-03 (×10): qty 1

## 2020-07-03 MED ORDER — COLCHICINE 0.6 MG PO TABS
0.6000 mg | ORAL_TABLET | Freq: Two times a day (BID) | ORAL | Status: DC
  Administered 2020-07-03: 0.6 mg via ORAL
  Filled 2020-07-03: qty 1

## 2020-07-03 MED ORDER — COLCHICINE 0.3 MG HALF TABLET: 0.3000 mg | ORAL_TABLET | Freq: Two times a day (BID) | ORAL | Status: DC

## 2020-07-03 MED ORDER — FUROSEMIDE 40 MG PO TABS
40.0000 mg | ORAL_TABLET | Freq: Two times a day (BID) | ORAL | Status: DC
  Administered 2020-07-03 – 2020-07-04 (×2): 40 mg via ORAL
  Filled 2020-07-03: qty 1

## 2020-07-03 MED ORDER — CARVEDILOL 25 MG PO TABS
25.0000 mg | ORAL_TABLET | Freq: Two times a day (BID) | ORAL | Status: DC
  Administered 2020-07-03 – 2020-07-07 (×8): 25 mg via ORAL
  Filled 2020-07-03 (×8): qty 1

## 2020-07-03 MED ORDER — HYDROMORPHONE HCL 1 MG/ML IJ SOLN
1.0000 mg | Freq: Once | INTRAMUSCULAR | Status: AC
  Administered 2020-07-03: 1 mg via INTRAVENOUS
  Filled 2020-07-03: qty 1

## 2020-07-03 MED ORDER — KETOTIFEN FUMARATE 0.025 % OP SOLN
1.0000 [drp] | Freq: Two times a day (BID) | OPHTHALMIC | Status: DC
  Filled 2020-07-03 (×2): qty 5

## 2020-07-03 MED ORDER — HYDRALAZINE HCL 50 MG PO TABS
50.0000 mg | ORAL_TABLET | Freq: Three times a day (TID) | ORAL | Status: DC
  Administered 2020-07-03: 50 mg via ORAL
  Filled 2020-07-03: qty 1

## 2020-07-03 MED ORDER — VITAMIN D (ERGOCALCIFEROL) 1.25 MG (50000 UNIT) PO CAPS
50000.0000 [IU] | ORAL_CAPSULE | ORAL | Status: DC
  Administered 2020-07-06: 50000 [IU] via ORAL
  Filled 2020-07-03: qty 1

## 2020-07-03 MED ORDER — PREDNISONE 20 MG PO TABS: 20.0000 mg | ORAL_TABLET | Freq: Two times a day (BID) | ORAL | Status: DC

## 2020-07-03 MED ORDER — FERROUS SULFATE 325 (65 FE) MG PO TABS
325.0000 mg | ORAL_TABLET | ORAL | Status: DC
  Administered 2020-07-04 – 2020-07-07 (×2): 325 mg via ORAL
  Filled 2020-07-03 (×3): qty 1

## 2020-07-03 MED ORDER — SENNOSIDES-DOCUSATE SODIUM 8.6-50 MG PO TABS: 1.0000 | ORAL_TABLET | Freq: Every evening | ORAL | Status: DC | PRN

## 2020-07-03 MED ORDER — METHYLPREDNISOLONE SODIUM SUCC 125 MG IJ SOLR
125.0000 mg | Freq: Once | INTRAMUSCULAR | Status: AC
  Administered 2020-07-03: 125 mg via INTRAMUSCULAR
  Filled 2020-07-03: qty 2

## 2020-07-03 MED ORDER — ACETAMINOPHEN 325 MG PO TABS: 650.0000 mg | ORAL_TABLET | Freq: Four times a day (QID) | ORAL | Status: DC | PRN

## 2020-07-03 MED ORDER — FUROSEMIDE 10 MG/ML IJ SOLN
40.0000 mg | Freq: Once | INTRAMUSCULAR | Status: AC
  Administered 2020-07-03: 40 mg via INTRAVENOUS
  Filled 2020-07-03: qty 4

## 2020-07-03 NOTE — H&P (Signed)
History and Physical    Alexander Austin IHK:[id number] DOB: 1946/03/01 DOA: 07/02/2020  PCP: Windy Fast, MD   Patient coming from: Home   Chief Complaint: Bilateral foot pain   HPI: Alexander Austin is a 74 y.o. male with medical history significant for hypertension, paroxysmal atrial fibrillation on Eliquis, chronic diastolic CHF, chronic kidney disease stage IIIa, history of CVA, and gout, now presenting to the emergency department with severe pain involving the bilateral feet.  Patient reports developing pain and swelling involving the bilateral feet on 06/30/2020, most severe in the bilateral first MTP joint, and consistent with his prior gout flares.  Pain has continued to worsen to the point where he is unable to bear any weight, lives alone, and has had difficulty attending to his ADLs due to this.  He denies any fevers.  He denies any chest pain, cough, or shortness of breath.  ED Course: Upon arrival to the ED, patient is found to be afebrile, saturating well on room air, and with stable blood pressure.  Chemistry panel is notable for creatinine 1.50, similar to priors.  CBC is unremarkable.  BNP is mildly elevated.  Radiographs of the bilateral feet are notable for soft tissue swelling without acute fractures.  Chest x-ray is negative for acute findings.  Patient was treated with colchicine, steroids, and multiple doses of IV Dilaudid, but continues to have severe pain and is unable to ambulate with a walker due to this pain.  Review of Systems:  All other systems reviewed and apart from HPI, are negative.  Past Medical History:  Diagnosis Date   A-fib [facility])    Anxiety    Benign prostate hyperplasia    Cancer ([place])    Kidney   Chronic kidney disease    Congestive heart failure (CHF) (HCC)    CVA (cerebral infarction)    Depression    Dyspnea    GERD (gastroesophageal reflux disease)    Gout    Hematospermia 01/13/2012   History of myocardial infarction 1993    Hyperlipidemia    Hypertension    Sleep apnea    Stroke [facility])    Vitamin B 12 deficiency    Weakness of left leg 03/28/2020    Past Surgical History:  Procedure Laterality Date   ANGIOPLASTY     X 8    BACK SURGERY     CHOLECYSTECTOMY     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   SAPHENOUS VEIN GRAFT RESECTION  02/05/15     reports that he quit smoking about 37 years ago. He has never used smokeless tobacco. He reports that he does not drink alcohol and does not use drugs.  Allergies  Allergen Reactions   Isosorbide Nitrate Anaphylaxis    Other reaction(s): Cardiovascular Arrest (ALLERGY/intolerance) Can take Sublingual Nitro.    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    Family History  Problem Relation Age of Onset   Hypertension Mother    Heart disease Mother    Stroke Mother    Hypertension Father      Prior to Admission medications   Medication Sig Start Date End Date Taking? Authorizing Provider  amLODipine (NORVASC) 10 MG tablet Take 10 mg by mouth daily.    [provider]  apixaban (ELIQUIS) 5 MG TABS tablet Take 5 mg by mouth 2 (two) times daily.     [provider]  aspirin EC 81 MG tablet Take 81 mg by  mouth daily.    [provider]  atorvastatin (LIPITOR) 80 MG tablet Take 80 mg by mouth daily.     [provider]  calcitRIOL (ROCALTROL) 0.25 MCG capsule Take 0.25 mcg by mouth daily.     [provider]  carvedilol (COREG) 25 MG tablet Take 25 mg by mouth in the morning and at bedtime.  05/05/15   [provider]  clopidogrel (PLAVIX) 75 MG tablet Take 75 mg by mouth daily.     [provider]  colchicine 0.6 MG tablet Take 1 tablet (0.6 mg total) by mouth 2 (two) times daily. 05/26/20   Geradine Girt, DO  diclofenac Sodium (VOLTAREN) 1 % GEL Apply 2 g topically 4 (four) times daily. 05/26/20   Geradine Girt, DO  ferrous  sulfate 325 (65 FE) MG tablet Take 325 mg by mouth every Monday, Wednesday, and Friday.  10/18/18   [provider]  furosemide (LASIX) 40 MG tablet Take 1 tablet (40 mg total) by mouth daily. Patient taking differently: Take 40 mg by mouth 2 (two) times daily.  04/05/20   Florencia Reasons, MD  hydrALAZINE (APRESOLINE) 50 MG tablet Take 1 tablet (50 mg total) by mouth 3 (three) times daily. 04/04/20   Florencia Reasons, MD  ketotifen (ZADITOR) 0.025 % ophthalmic solution Place 1 drop into both eyes 2 (two) times daily.     [provider]  pantoprazole (PROTONIX) 40 MG tablet Take 40 mg by mouth daily.     [provider]  predniSONE (DELTASONE) 10 MG tablet 30mg  x 3 days then 20 mg x 3 days then 10 mg x 3 days then stop 05/26/20   Geradine Girt, DO  primidone (MYSOLINE) 50 MG tablet Take 100 mg by mouth in the morning and at bedtime.     [provider]  sertraline (ZOLOFT) 50 MG tablet Take 25 mg by mouth at bedtime.     [provider]  Vitamin D, Ergocalciferol, (DRISDOL) 1.25 MG (50000 UNIT) CAPS capsule Take 50,000 Units by mouth every 7 (seven) days. Sunday    [provider]    Physical Exam: Vitals:   07/02/20 1652 07/02/20 2137 07/03/20 0130 07/03/20 0500  BP:  (!) 153/86 (!) 145/60   Pulse:  78 70   Resp:  18 16   Temp:  98.7 F (37.1 C) 98.1 F (36.7 C)   TempSrc:  Oral Oral   SpO2:  99% 98% 95%  Weight: 107 kg     Height: 6\' 2"  (1.88 m)       Constitutional: NAD, calm  Eyes: PERTLA, lids and conjunctivae normal ENMT: Mucous membranes are moist. Posterior pharynx clear of any exudate or lesions.   Neck: normal, supple, no masses, no thyromegaly Respiratory: no wheezing, no crackles. No accessory muscle use.  Cardiovascular: S1 & S2 heard, regular rate and rhythm. Pretibial pitting edema bilaterally. Abdomen: No distension, no tenderness, soft. Bowel sounds active.  Musculoskeletal: no clubbing / cyanosis. Bilateral feet are edematous,  tender, and with faint erythema at 1st MTP.   Skin: no significant rashes, lesions, ulcers. Warm, dry, well-perfused. Neurologic: CN 2-12 grossly intact. Sensation intact. Moving all extremities.  Psychiatric: Alert and oriented to person, place, and situation. Calm and cooperative.    Labs and Imaging on Admission: I have personally reviewed following labs and imaging studies  CBC: Recent Labs  Lab 07/02/20 1722  WBC 5.6  NEUTROABS 4.4  HGB 12.7*  HCT 40.2  MCV 87.2  PLT 272   Basic Metabolic Panel: Recent Labs  Lab 07/02/20 1722  NA 141  K 3.8  CL 105  CO2 25  GLUCOSE 98  BUN 16  CREATININE 1.50*  CALCIUM 9.1   GFR: Estimated Creatinine Clearance: 56.3 mL/min (A) (by C-G formula based on SCr of 1.5 mg/dL (H)). Liver Function Tests: No results for input(s): AST, ALT, ALKPHOS, BILITOT, PROT, ALBUMIN in the last 168 hours. No results for input(s): LIPASE, AMYLASE in the last 168 hours. No results for input(s): AMMONIA in the last 168 hours. Coagulation Profile: No results for input(s): INR, PROTIME in the last 168 hours. Cardiac Enzymes: No results for input(s): CKTOTAL, CKMB, CKMBINDEX, TROPONINI in the last 168 hours. BNP (last 3 results) No results for input(s): PROBNP in the last 8760 hours. HbA1C: No results for input(s): HGBA1C in the last 72 hours. CBG: No results for input(s): GLUCAP in the last 168 hours. Lipid Profile: No results for input(s): CHOL, HDL, LDLCALC, TRIG, CHOLHDL, LDLDIRECT in the last 72 hours. Thyroid Function Tests: No results for input(s): TSH, T4TOTAL, FREET4, T3FREE, THYROIDAB in the last 72 hours. Anemia Panel: No results for input(s): VITAMINB12, FOLATE, FERRITIN, TIBC, IRON, RETICCTPCT in the last 72 hours. Urine analysis:    Component Value Date/Time   COLORURINE YELLOW 03/07/2020 0845   APPEARANCEUR CLEAR 03/07/2020 0845   LABSPEC 1.025 03/07/2020 0845   PHURINE 5.5 03/07/2020 0845   GLUCOSEU NEGATIVE 03/07/2020 0845    HGBUR SMALL (A) 03/07/2020 0845   BILIRUBINUR NEGATIVE 03/07/2020 0845   KETONESUR NEGATIVE 03/07/2020 0845   PROTEINUR NEGATIVE 03/07/2020 0845   NITRITE NEGATIVE 03/07/2020 0845   LEUKOCYTESUR NEGATIVE 03/07/2020 0845   Sepsis Labs: @LABRCNTIP (procalcitonin:4,lacticidven:4) )No results found for this or any previous visit (from the past 240 hour(s)).   Radiological Exams on Admission: DG Chest Portable 1 View  Result Date: 07/03/2020 CLINICAL DATA:  74 year old male, query pulmonary edema. EXAM: PORTABLE CHEST 1 VIEW COMPARISON:  Portable chest 05/21/2020 and earlier. FINDINGS: Portable AP semi upright view at 0021 hours. Chronic calcified left lung granuloma. Lung volumes and mediastinal contours are stable. Prior CABG. Stable left chest AICD. No pneumothorax, pulmonary edema, pleural effusion or acute pulmonary opacity. Visualized tracheal air column is within normal limits. No acute osseous abnormality identified. IMPRESSION: No acute cardiopulmonary abnormality. Electronically Signed   By: Genevie Ann M.D.   On: 07/03/2020 00:30   DG Foot Complete Left  Result Date: 07/02/2020 CLINICAL DATA:  Bilateral midfoot pain for 2 days, gout EXAM: LEFT FOOT - COMPLETE 3+ VIEW; RIGHT FOOT COMPLETE - 3+ VIEW COMPARISON:  04/02/2020, 05/21/2020 FINDINGS: Left foot: Frontal, oblique, and lateral views are obtained. Bones are diffusely osteopenic. No acute displaced fracture. 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 Stable calcaneal spurs. There is diffuse soft tissue edema. Right foot: Frontal, oblique, lateral views are obtained. Bones are diffusely osteopenic. No acute displaced fracture. 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 Stable calcaneal spurs. There is diffuse soft tissue edema. IMPRESSION: 1. Diffuse soft tissue edema of the bilateral feet. 2. No acute fractures. 3. 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 Electronically Signed   By: Randa Ngo M.D.   On:  07/02/2020 17:54   DG Foot Complete Right  Result Date: 07/02/2020 CLINICAL DATA:  Bilateral midfoot pain for 2 days, gout EXAM: LEFT FOOT - COMPLETE 3+ VIEW; RIGHT FOOT COMPLETE - 3+ VIEW COMPARISON:  04/02/2020, 05/21/2020 FINDINGS: Left foot: Frontal, oblique, and lateral views are obtained. Bones are diffusely  osteopenic. No acute displaced fracture. 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 Stable calcaneal spurs. There is diffuse soft tissue edema. Right foot: Frontal, oblique, lateral views are obtained. Bones are diffusely osteopenic. No acute displaced fracture. 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 Stable calcaneal spurs. There is diffuse soft tissue edema. IMPRESSION: 1. Diffuse soft tissue edema of the bilateral feet. 2. No acute fractures. 3. 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 Electronically Signed   By: Randa Ngo M.D.   On: 07/02/2020 17:54    EKG: Independently reviewed.   Assessment/Plan   1. Acute gout flare; intractable pain  - Presents with severe pain in bilateral feet consistent with his gout flares, does not have fever or leukocytosis, denies trauma, and plain films notable for edema only  - He continues to have severe pain and is unable to ambulate due to this despite aggressive treatment with IV Dilaudid in ED  - Continue steroids, continue IV Dilaudid for now and transition to oral therapy as his condition improves     2. Chronic diastolic CHF  - Appears compensated  - Given a dose of IV Lasix in ED  - Resume his oral Lasix, monitor weight    3. Paroxysmal atrial fibrillation  - CHADS-VASc is 6 (CVA x2, CAD, CHF, HTN, age)  - Continue Eliquis    4. Hypertension  - BP at goal, follow-up pharmacy medication-reconciliation    5. CKD IIIa  - SCr is 1.50 in ED, consistent with his apparent baseline  - Renally-dose medications, monitor    6. CAD  - No anginal complaints    DVT prophylaxis: Eliquis  Code  Status: Partial, no intubation  Family Communication: Discussed with patient  Disposition Plan:  Patient is from: Home  Anticipated d/c is to: TBD Anticipated d/c date is: 07/04/20 Patient currently: Having intractable pain  Consults called: None  Admission status: Observation     Vianne Bulls, MD [facility]  07/03/2020, 6:31 AM

## 2020-07-03 NOTE — ED Notes (Addendum)
Pt O2sat was 89% while sleeping. Upon awakening and speaking, pt O2sat rose to 95%.

## 2020-07-03 NOTE — Progress Notes (Signed)
Alexander Austin is a 74 y.o. male patient admitted from ED awake, alert - oriented  X 4 - no acute distress noted.  VSS - Blood pressure (!) 155/67, pulse 73, temperature 97.7 F (36.5 C), temperature source Oral, resp. rate 19, height 6\' 2"  (1.88 m), weight 107 kg, SpO2 98 %.    IV in place, occlusive dsg intact without redness.  Orientation to room, and floor completed with information packet given to patient. Call light within reach, patient able to voice, and demonstrate understanding. Did have bruising on both arms. No evidence of skin break down noted on exam. Patient is in bed with no complaints of pain.    Dene Gentry, RN 07/03/2020 7:51 PM

## 2020-07-03 NOTE — ED Notes (Signed)
Admitting at bedside 

## 2020-07-03 NOTE — ED Notes (Signed)
Attempted to ambulate pt. Pt states he uses a cane or walker at home to ambulate. He has had problems getting around in the past. [facility] this [facility]. Pt was able to stand but states pain 9/10 on his right foot and could not continue to walk. Informed Dr. Dayna Barker. RN to give second dose of dilaudid.

## 2020-07-03 NOTE — Progress Notes (Addendum)
PROGRESS NOTE    Patient: Alexander Austin                            PCP: Windy Fast, MD                    DOB: 11-07-1946            DOA: 07/02/2020 JSH:[id number]             DOS: 07/03/2020, 11:55 AM   LOS: 0 days   Date of Service: The patient was seen and examined on 07/03/2020  Subjective:   The patient was seen and examined.  Stable still complain lower extremity foot/ankle pain swelling due to flareup of gout.  Also lower extremity edema due to CHF. Reports he ambulates with a walker at home.  He lives alone at home.  Denies any chest pain, mild shortness of breath worsening with exertion.  Brief Narrative:   Alexander Austin is a 74 y.o. male with medical history significant for hypertension, paroxysmal atrial fibrillation on Eliquis, chronic diastolic CHF, chronic kidney disease stage IIIa, history of CVA, and gout, now presenting to the emergency department with severe pain involving the bilateral feet.  Patient reports developing pain and swelling involving the bilateral feet on 06/30/2020, most severe in the bilateral first MTP joint, and consistent with his prior gout flares.  Pain has continued to worsen to the point where he is unable to bear any weight, lives alone, and has had difficulty attending to his ADLs due his gout flareup.  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 steroids will be switched to IV prednisone -Continue home dose colchicine 0.6 mg p.o. twice daily -As needed analgesics Dilaudid  does  not have fever or leukocytosis, denies trauma, and plain films notable for edema only  - He continues to have severe pain and is unable to ambulate due to this despite aggressive treatment with IV Dilaudid in  -Monitoring uric acid -Patient has extensive history of gout but his allopurinol was stopped on the last hospitalization  2Chronic diastolic CHF  - Appears compensated  - Given a dose of IV Lasix in ED  - Resume his oral Lasix, monitor weight   -Monitoring I's and O's   Paroxysmal atrial fibrillation  -Remained stable, heart rate well controlled with current meds - CHADS-VASc is 6 (CVA x2, CAD, CHF, HTN, age)  - Continue Eliquis     Hypertension  -Monitoring BP closely -Home meds reviewed resumed accordingly Including Coreg, Lasix, hydralazine,  5CKD IIIa  - SCr is 1.50 in ED, consistent with his apparent baseline  - Renally-dose medications, monitor   -Avoiding nephrotoxins  CAD  - No anginal complaints   Debility -Severe debility generalized weakness, bilateral lower extremity edema weakness tenderness unable to ambulate, needs assist with all ADLs -PT/OT/social worker consulted appreciate evaluation recommendation   DVT prophylaxis: Eliquis  Code Status: Partial, no intubation  Family Communication: Discussed with patient  Disposition Plan:  Patient is  from: Home  Anticipated d/c is to: TBD -likely home with home health versus SNF Anticipated d/c date is: 07/04/20 Patient currently: Having intractable pain, with swelling in the feet and lower extremity unable to ambulate Consults called: None  Admission status: Observation           Procedures:   No admission procedures for hospital encounter. Antimicrobials:  Anti-infectives (From admission, onward)   None       Medication:  . amLODipine  10 mg Oral Daily  . apixaban  5 mg Oral BID  . aspirin EC  81 mg Oral Daily  . atorvastatin  80 mg Oral Daily  . calcitRIOL  0.25 mcg Oral Daily  .  carvedilol  25 mg Oral BID WC  . colchicine  0.6 mg Oral BID  . [START ON 07/04/2020] ferrous sulfate  325 mg Oral Q M,W,F  . furosemide  40 mg Oral BID  . hydrALAZINE  50 mg Oral TID  . ketotifen  1 drop Both Eyes BID  . methylPREDNISolone (SOLU-MEDROL) injection  60 mg Intravenous Q12H  . pantoprazole  40 mg Oral Daily  . primidone  100 mg Oral BID  . sertraline  25 mg Oral QHS  . sodium chloride flush  3 mL Intravenous Q12H  . Vitamin D (Ergocalciferol)  50,000 Units Oral Q7 days    sodium chloride, acetaminophen **OR** acetaminophen, HYDROmorphone (DILAUDID) injection, ondansetron (ZOFRAN) IV, senna-docusate, sodium chloride flush   Objective:   Vitals:   07/03/20 0130 07/03/20 0500 07/03/20 0649 07/03/20 1120  BP: (!) 145/60  (!) 146/71 (!) 142/75  Pulse: 70  81 79  Resp: 16  17 16   Temp: 98.1 F (36.7 C)     TempSrc: Oral  Oral   SpO2: 98% 95% 93% 95%  Weight:      Height:       No intake or output data in the 24 hours ending 07/03/20 1155 Filed Weights   07/02/20 1652  Weight: 107 kg     Examination:   Physical Exam  Constitution:  Alert, cooperative, no distress,  Appears calm and comfortable  Psychiatric: Normal and stable mood and affect, cognition intact,   HEENT: Normocephalic, PERRL, otherwise with in Normal limits  Chest:Chest symmetric Cardio vascular:  S1/S2, RRR, No murmure, No Rubs or Gallops  pulmonary: Clear to auscultation bilaterally, respirations unlabored, negative wheezes / crackles Abdomen: Soft, non-tender, non-distended, bowel sounds,no masses, no organomegaly Muscular skeletal:  Generalized symmetrical weaknesses, lower extremity/feet tenderness Limited range of motion due to gout Limited exam - in bed, able to move all 4 extremities, Normal strength,  Neuro: CNII-XII intact. , normal motor and sensation, reflexes intact  Extremities: +2  pitting edema lower extremities, +2 pulses  Skin: Dry, warm to touch, negative for any Rashes, No  open wounds Wounds: per nursing documentation    ------------------------------------------------------------------------------------------------------------------------------------------    LABs:  CBC Latest Ref Rng & Units 07/02/2020 05/24/2020 05/21/2020  WBC 4.0 - 10.5 K/uL 5.6 4.3 6.2  Hemoglobin 13.0 - 17.0 g/dL 12.7(L) 9.8(L) 11.3(L)  Hematocrit 39 - 52 % 40.2 30.2(L) 35.1(L)  Platelets 150 - 400 K/uL 186 177 151   CMP Latest Ref Rng & Units 07/02/2020 05/24/2020 05/22/2020  Glucose 70 - 99 mg/dL 98 103(H) 111(H)  BUN 8 - 23 mg/dL 16 32(H) 21  Creatinine 0.61 - 1.24 mg/dL 1.50(H) 1.63(H) 1.67(H)  Sodium 135 - 145 mmol/L 141 140 139  Potassium 3.5 - 5.1 mmol/L 3.8 3.1(L) 3.1(L)  Chloride 98 -  111 mmol/L 105 108 106  CO2 22 - 32 mmol/L 25 24 24   Calcium 8.9 - 10.3 mg/dL 9.1 8.3(L) 8.1(L)  Total Protein 6.5 - 8.1 g/dL - - -  Total Bilirubin 0.3 - 1.2 mg/dL - - -  Alkaline Phos 38 - 126 U/L - - -  AST 15 - 41 U/L - - -  ALT 0 - 44 U/L - - -       Micro Results Recent Results (from the past 240 hour(s))  SARS Coronavirus 2 by RT PCR (hospital order, performed in [facility] hospital lab) Nasopharyngeal Nasopharyngeal Swab     Status: None   Collection Time: 07/03/20  6:43 AM   Specimen: Nasopharyngeal Swab  Result Value Ref Range Status   SARS Coronavirus 2 NEGATIVE NEGATIVE Final    Comment: (NOTE) SARS-CoV-2 target nucleic acids are NOT DETECTED.  The SARS-CoV-2 RNA is generally detectable in upper and lower respiratory specimens during the acute phase of infection. The lowest concentration of SARS-CoV-2 viral copies this assay can detect is 250 copies / mL. A negative result does not preclude SARS-CoV-2 infection and should not be used as the sole basis for treatment or other patient management decisions.  A negative result may occur with improper specimen collection / handling, submission of specimen other than nasopharyngeal swab, presence of viral mutation(s) within  the areas targeted by this assay, and inadequate number of viral copies (<250 copies / mL). A negative result must be combined with clinical observations, patient history, and epidemiological information.  Fact Sheet for Patients:   StrictlyIdeas.no  Fact Sheet for Healthcare Providers: BankingDealers.co.za  This test is not yet approved or  cleared by the [place] FDA and has been authorized for detection and/or diagnosis of SARS-CoV-2 by FDA under an Emergency Use Authorization (EUA).  This EUA will remain in effect (meaning this test can be used) for the duration of the COVID-19 declaration under Section 564(b)(1) of the Act, 21 U.S.C. section 360bbb-3(b)(1), unless the authorization is terminated or revoked sooner.  Performed at [facility], [place] [address], [place], [place] [address]     Radiology Reports DG Chest Portable 1 View  Result Date: 07/03/2020 CLINICAL DATA:  74 year old male, query pulmonary edema. EXAM: PORTABLE CHEST 1 VIEW COMPARISON:  Portable chest 05/21/2020 and earlier. FINDINGS: Portable AP semi upright view at 0021 hours. Chronic calcified left lung granuloma. Lung volumes and mediastinal contours are stable. Prior CABG. Stable left chest AICD. No pneumothorax, pulmonary edema, pleural effusion or acute pulmonary opacity. Visualized tracheal air column is within normal limits. No acute osseous abnormality identified. IMPRESSION: No acute cardiopulmonary abnormality. Electronically Signed   By: Genevie Ann M.D.   On: 07/03/2020 00:30   DG Foot Complete Left  Result Date: 07/02/2020 CLINICAL DATA:  Bilateral midfoot pain for 2 days, gout EXAM: LEFT FOOT - COMPLETE 3+ VIEW; RIGHT FOOT COMPLETE - 3+ VIEW COMPARISON:  04/02/2020, 05/21/2020 FINDINGS: Left foot: Frontal, oblique, and lateral views are obtained. Bones are diffusely osteopenic. No acute displaced fracture. 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 Stable calcaneal spurs. There is diffuse soft tissue edema. Right foot: Frontal, oblique, lateral views are obtained. Bones are diffusely osteopenic. No acute displaced fracture. 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 Stable calcaneal spurs. There is diffuse soft tissue edema. IMPRESSION: 1. Diffuse soft tissue edema of the bilateral feet. 2. No acute fractures. 3. 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 Electronically Signed   By: Legrand Como  [name] M.D.   On: 07/02/2020 17:54   DG Foot Complete Right  Result Date: 07/02/2020 CLINICAL DATA:  Bilateral midfoot pain for 2 days, gout EXAM: LEFT FOOT - COMPLETE 3+ VIEW; RIGHT FOOT COMPLETE - 3+ VIEW COMPARISON:  04/02/2020, 05/21/2020 FINDINGS: Left foot: Frontal, oblique, and lateral views are obtained. Bones are diffusely osteopenic. No acute displaced fracture. 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 Stable calcaneal spurs. There is diffuse soft tissue edema. Right foot: Frontal, oblique, lateral views are obtained. Bones are diffusely osteopenic. No acute displaced fracture. 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 Stable calcaneal spurs. There is diffuse soft tissue edema. IMPRESSION: 1. Diffuse soft tissue edema of the bilateral feet. 2. No acute fractures. 3. 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 Electronically Signed   By: Randa Ngo M.D.   On: 07/02/2020 17:54    SIGNED: Deatra James, MD, FACP, FHM. [facility],  Pager (please use [email or web address] to page/text)  If 7PM-7AM, please contact night-coverage [email or web address] 07/03/2020, 11:55 AM

## 2020-07-04 LAB — CBC
HCT: 33.9 % — ABNORMAL LOW (ref 39.0–52.0)
HCT: 37.2 % — ABNORMAL LOW (ref 39.0–52.0)
Hemoglobin: 10.8 g/dL — ABNORMAL LOW (ref 13.0–17.0)
Hemoglobin: 12 g/dL — ABNORMAL LOW (ref 13.0–17.0)
MCH: 27.6 pg (ref 26.0–34.0)
MCH: 28.1 pg (ref 26.0–34.0)
MCHC: 31.9 g/dL (ref 30.0–36.0)
MCHC: 32.3 g/dL (ref 30.0–36.0)
MCV: 86.5 fL (ref 80.0–100.0)
MCV: 87.1 fL (ref 80.0–100.0)
Platelets: 168 10*3/uL (ref 150–400)
Platelets: 183 10*3/uL (ref 150–400)
RBC: 3.92 MIL/uL — ABNORMAL LOW (ref 4.22–5.81)
RBC: 4.27 MIL/uL (ref 4.22–5.81)
RDW: 14.8 % (ref 11.5–15.5)
RDW: 14.8 % (ref 11.5–15.5)
WBC: 5.6 10*3/uL (ref 4.0–10.5)
WBC: 6.9 10*3/uL (ref 4.0–10.5)
nRBC: 0 % (ref 0.0–0.2)
nRBC: 0 % (ref 0.0–0.2)

## 2020-07-04 LAB — BASIC METABOLIC PANEL
Anion gap: 12 (ref 5–15)
BUN: 35 mg/dL — ABNORMAL HIGH (ref 8–23)
CO2: 22 mmol/L (ref 22–32)
Calcium: 8.7 mg/dL — ABNORMAL LOW (ref 8.9–10.3)
Chloride: 103 mmol/L (ref 98–111)
Creatinine, Ser: 1.83 mg/dL — ABNORMAL HIGH (ref 0.61–1.24)
GFR calc Af Amer: 41 mL/min — ABNORMAL LOW (ref >60)
GFR calc non Af Amer: 36 mL/min — ABNORMAL LOW (ref >60)
Glucose, Bld: 164 mg/dL — ABNORMAL HIGH (ref 70–99)
Potassium: 4 mmol/L (ref 3.5–5.1)
Sodium: 137 mmol/L (ref 135–145)

## 2020-07-04 LAB — CREATININE, SERUM
Creatinine, Ser: 1.95 mg/dL — ABNORMAL HIGH (ref 0.61–1.24)
GFR calc Af Amer: 38 mL/min — ABNORMAL LOW (ref >60)
GFR calc non Af Amer: 33 mL/min — ABNORMAL LOW (ref >60)

## 2020-07-04 MED ORDER — FUROSEMIDE 40 MG PO TABS
40.0000 mg | ORAL_TABLET | Freq: Two times a day (BID) | ORAL | Status: DC
  Administered 2020-07-05 – 2020-07-07 (×5): 40 mg via ORAL
  Filled 2020-07-04 (×6): qty 1

## 2020-07-04 MED ORDER — COLCHICINE 0.3 MG HALF TABLET
0.3000 mg | ORAL_TABLET | Freq: Two times a day (BID) | ORAL | Status: DC
  Administered 2020-07-04 – 2020-07-07 (×7): 0.3 mg via ORAL
  Filled 2020-07-04: qty 0.5
  Filled 2020-07-04: qty 1
  Filled 2020-07-04 (×2): qty 0.5
  Filled 2020-07-04: qty 1
  Filled 2020-07-04: qty 0.5
  Filled 2020-07-04: qty 1
  Filled 2020-07-04: qty 0.5
  Filled 2020-07-04: qty 1

## 2020-07-04 NOTE — TOC Initial Note (Addendum)
Transition of Care [facility]) - Initial/Assessment Note    Patient Details  Name: Alexander Austin MRN: [id number] Date of Birth: [birth year]  Transition [facility]) CM/SW Contact:    Benard Halsted, LCSW Phone Number: 07/04/2020, 11:25 AM  Clinical Narrative:                 CSW received consult for possible SNF at time of discharge. CSW spoke with patient regarding recommendation of SNF at time of discharge. Patient reported that he does not want to go to SNF as he has been to a few before. He stated he would like home health services. He reproted that his landlords live right upstairs and they assist him a lot. Patient reports preference for [facility] since he uses their aide service. He currently believes he is active with [facility] but does not want to continue with them. CSW will send referral for review. CSW provided Medicare [place] ratings list. CSW discussed equipment needs with patient and he reported that he has a stair lift, a scooter, a knee scooter, a wheelchair, 3 canes and 2 walkers. He also has a walk-in shower. No other DME requested. CSW confirmed PCP and address with patient. Patient states his landlord will come pick him up at discharge. Patients [facility] SW is [facility] [phone] ex [phone]). No further questions reported at this time. CSW to continue to follow and assist with discharge planning needs.  UPDATE: CSW spoke with [name] at [employer]. She reported that the nurse that patient had previously had concerns with is no longer with them if patient wants to continue their services. CSW alerted patient and made him aware that Alvis Lemmings was not in network with his [employer] and would have to go through his Medicare. Patient reported that in that case, he would prefer to continue services with [facility]. [facility] aware. Patient will require Home Health RN/PT/OT orders and Face to Face at discharge.    Expected Discharge Plan: [place] Barriers to Discharge: Continued  Medical Work up   Patient Goals and CMS Choice Patient states their goals for this hospitalization and ongoing recovery are:: Return home CMS Medicare.gov Compare Post Acute Care list provided to:: Patient Choice offered to / list presented to : Patient  Expected Discharge Plan and Services Expected Discharge Plan: [place] In-house Referral: Clinical Social Work Discharge Planning Services: CM Consult Post Acute Care Choice: [place] arrangements for the past 2 months: Single Family Home                 DME Arranged: N/A DME Agency: NA                  Prior Living Arrangements/Services Living arrangements for the past 2 months: Single Family Home Lives with:: Other (Comment) (Landlords) Patient language and need for interpreter reviewed:: Yes Do you feel safe going back to the place where you live?: Yes      Need for Family Participation in Patient Care: No (Comment) Care giver support system in place?: Yes (comment) Current home services: DME, Homehealth aide (WC, walker, scooter) Criminal Activity/Legal Involvement Pertinent to Current Situation/Hospitalization: No - Comment as needed  Activities of Daily Living      Permission Sought/Granted Permission sought to share information with : Facility Art therapist granted to share information with : Yes, Verbal Permission Granted     Permission granted to share info w AGENCY: [place]  Emotional Assessment Appearance:: Appears stated age Attitude/Demeanor/Rapport: Engaged Affect (typically observed): Accepting, Appropriate Orientation: : Oriented to Self, Oriented to Place, Oriented to  Time, Oriented to Situation Alcohol / Substance Use: Not Applicable Psych Involvement: No (comment)  Admission diagnosis:  Acute gout [M10.9] Acute idiopathic gout involving toe, unspecified laterality [M10.079] Gout [M10.9] Patient Active Problem List   Diagnosis  Date Noted  . Gout 07/03/2020  . Gout flare 05/21/2020  . Chronic diastolic CHF (congestive heart failure) ([place]) 03/28/2020  . Acute gout 03/07/2020  . Chronic kidney disease, stage 3a 03/07/2020  . Obesity (BMI 30.0-34.9) 03/07/2020  . Tremor 04/16/2015  . BPH (benign prostatic hyperplasia) 04/16/2015  . Chronic anticoagulation 04/16/2015  . Edema 04/16/2015  . Urinary retention 03/06/2015  . 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 NSTEMI (non-ST elevated myocardial infarction) ([place]) 03/06/2015  . PAF (paroxysmal atrial fibrillation) ([place]) 03/06/2015  . History of renal cell cancer 03/06/2015  . Vitamin B12 deficiency 03/06/2015  . Major depressive disorder, recurrent, in remission ([place]) 03/06/2015  . History of stroke 03/06/2015  . GERD (gastroesophageal reflux disease) 03/06/2015  . Essential hypertension, benign 03/06/2015  . Gout, arthritis 03/06/2015   PCP:  Windy Fast, MD Pharmacy:   [id number] - [place], [place] [address] Phone: [phone] Fax: [phone]  [id number] [address] [place] [address] Phone: [phone] Fax: [place], [place] - [place] [phone] [place] [address] Phone: [phone] Fax: [phone]     Social Determinants of Health (SDOH) Interventions    Readmission Risk Interventions Readmission Risk Prevention Plan 07/04/2020 04/03/2020  Transportation Screening Complete Complete  PCP or Specialist Appt within 3-5 Days - Complete  HRI or [place] - Complete  Social Work Consult for [place] Planning/Counseling - Complete  Palliative Care Screening - Not Applicable  Medication Review ([place]) Referral to Pharmacy  Complete  PCP or Specialist appointment within 3-5 days of discharge Complete -  [place] or Home Care Consult Complete -  [place] Recovery Care/Counseling Consult Complete -  Palliative Care Screening Not Applicable -  [place] Patient Refused -  Some recent data might be hidden

## 2020-07-04 NOTE — Progress Notes (Signed)
PROGRESS NOTE    Patient: Alexander Austin                            PCP: Windy Fast, MD                    DOB: [birth year]            DOA: 07/02/2020 TDD:[id number]             DOS: 07/04/2020, 12:12 PM   LOS: 1 day   Date of Service: The patient was seen and examined on 07/04/2020  Subjective:   The patient was seen and examined this morning, stable awake alert oriented denies any chest pain or shortness of breath. Reporting this morning he was trying to ambulate with assist could not do much due to extensive bilateral ankle foot pain still complaining of pain discomfort edema mild.    Brief Narrative:   Alexander Austin is a 74 y.o. male with medical history significant for hypertension, paroxysmal atrial fibrillation on Eliquis, chronic diastolic CHF, chronic kidney disease stage IIIa, history of CVA, and gout, now presenting to the emergency department with severe pain involving the bilateral feet.  Patient reports developing pain and swelling involving the bilateral feet on 06/30/2020, most severe in the bilateral first MTP joint, and consistent with his prior gout flares.  Pain has continued to worsen to the point where he is unable to bear any weight, lives alone, and has had difficulty attending to his ADLs due his gout flareup.  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 With symptomatic improvement -Continue home dose colchicine 0.6 mg p.o. twice  daily >> anticipated reduction in dose -As needed analgesics Dilaudid  does not have fever or leukocytosis, denies trauma, and plain films notable for edema only  - He continues to have severe pain and is unable to ambulate due to this despite aggressive treatment with IV Dilaudid in  -Monitoring uric acid -Patient has extensive history of gout but his allopurinol was stopped on the last hospitalization   2Chronic diastolic CHF  - Appears compensated  - Given a dose of IV Lasix  - On oral Lasix 40 twice   --- but holding next dose of Lasix, patient's creatinine elevated from baseline -Monitoring I's and O's -490,  Monitoring daily weight -Weight 104.1 kg (baseline, dry weight unknown)   Paroxysmal atrial fibrillation  -Remained stable, heart rate well controlled with current meds - CHADS-VASc is 6 (CVA x2, CAD, CHF, HTN, age)  - Continue Eliquis     Hypertension  -Monitoring BP closely -Home meds reviewed resumed accordingly Including Coreg, Lasix, hydralazine,  5CKD IIIa  - SCr is 1.50 in ED, >>> 1.95 - Renally-dose medications, monitor   -Avoiding nephrotoxins -With holding next dose of Lasix,  CAD  - No anginal complaints   Debility -Severe debility generalized weakness, bilateral lower extremity edema weakness tenderness unable to  ambulate, needs assist with all ADLs -PT/OT/social worker consulted appreciate evaluation recommendation   DVT prophylaxis: Eliquis  Code Status: Partial, no intubation  Family Communication: Discussed with patient  Disposition Plan:  Patient is from: Home  Anticipated d/c is to: TBD -likely home with home health versus SNF Anticipated d/c date is: 07/04/20 Patient currently: Having intractable pain, with swelling in the feet and lower extremity unable to ambulate Consults called: None  Admission status: Observation           Procedures:   No admission procedures for hospital encounter. Antimicrobials:  Anti-infectives  (From admission, onward)   None       Medication:  . amLODipine  10 mg Oral Daily  . apixaban  5 mg Oral BID  . aspirin EC  81 mg Oral Daily  . atorvastatin  80 mg Oral Daily  . calcitRIOL  0.25 mcg Oral Daily  . carvedilol  25 mg Oral BID WC  . colchicine  0.3 mg Oral BID  . ferrous sulfate  325 mg Oral Q M,W,F  . [START ON 07/05/2020] furosemide  40 mg Oral BID  . hydrALAZINE  50 mg Oral TID  . ketotifen  1 drop Both Eyes BID  . methylPREDNISolone (SOLU-MEDROL) injection  60 mg Intravenous Q12H  . pantoprazole  40 mg Oral Daily  . primidone  100 mg Oral BID  . sertraline  25 mg Oral QHS  . sodium chloride flush  3 mL Intravenous Q12H  . [START ON 07/06/2020] Vitamin D (Ergocalciferol)  50,000 Units Oral Q7 days    sodium chloride, acetaminophen **OR** acetaminophen, HYDROmorphone (DILAUDID) injection, ondansetron (ZOFRAN) IV, senna-docusate, sodium chloride flush   Objective:   Vitals:   07/03/20 1743 07/03/20 2007 07/03/20 2327 07/04/20 0415  BP: (!) 155/67 (!) 140/56 (!) 157/63 (!) 161/67  Pulse:  60 63 (!) 57  Resp:  18 17 16   Temp:  97.8 F (36.6 C) 98.6 F (37 C) (!) 97.3 F (36.3 C)  TempSrc:   Oral Oral  SpO2:  95% 94% 98%  Weight:    104.1 kg  Height:        Intake/Output Summary (Last 24 hours) at 07/04/2020 1212 Last data filed at 07/04/2020 0433 Gross per 24 hour  Intake 360 ml  Output 850 ml  Net -490 ml   Filed Weights   07/02/20 1652 07/04/20 0415  Weight: 107 kg 104.1 kg     Examination:     Physical Exam  Constitution:  Alert, cooperative, no distress,  Psychiatric: Normal and stable mood and affect, cognition intact,   HEENT: Normocephalic, PERRL, otherwise with in Normal limits  Chest:Chest symmetric Cardio vascular:  S1/S2, RRR, No murmure, No Rubs or Gallops  pulmonary: Clear to auscultation bilaterally, respirations unlabored, negative wheezes / crackles Abdomen: Soft, non-tender, non-distended, bowel sounds,no masses, no  organomegaly Muscular skeletal:  Bilateral lower extremity foot/ankle area weakness due to pain, edema -likely gout flareup  Limited exam - in bed, able to move all 4 extremities, Neuro: CNII-XII intact. , normal motor and sensation, reflexes intact  Extremities:  +2 pitting edema lower extremities foot/ankle area, +2 pulses  Skin: Dry, warm to touch, negative for any Rashes, bilateral lower extremity, ankle, foot edema tenderness Wounds: per nursing documentation         ------------------------------------------------------------------------------------------------------------------------------------------    LABs:  CBC Latest Ref Rng & Units 07/04/2020 07/04/2020 07/02/2020  WBC 4.0 - 10.5 K/uL 6.9 5.6 5.6  Hemoglobin 13.0 - 17.0 g/dL 12.0(L) 10.8(L)  12.7(L)  Hematocrit 39 - 52 % 37.2(L) 33.9(L) 40.2  Platelets 150 - 400 K/uL 183 168 186   CMP Latest Ref Rng & Units 07/04/2020 07/04/2020 07/02/2020  Glucose 70 - 99 mg/dL - 164(H) 98  BUN 8 - 23 mg/dL - 35(H) 16  Creatinine 0.61 - 1.24 mg/dL 1.95(H) 1.83(H) 1.50(H)  Sodium 135 - 145 mmol/L - 137 141  Potassium 3.5 - 5.1 mmol/L - 4.0 3.8  Chloride 98 - 111 mmol/L - 103 105  CO2 22 - 32 mmol/L - 22 25  Calcium 8.9 - 10.3 mg/dL - 8.7(L) 9.1  Total Protein 6.5 - 8.1 g/dL - - -  Total Bilirubin 0.3 - 1.2 mg/dL - - -  Alkaline Phos 38 - 126 U/L - - -  AST 15 - 41 U/L - - -  ALT 0 - 44 U/L - - -       Micro Results Recent Results (from the past 240 hour(s))  SARS Coronavirus 2 by RT PCR (hospital order, performed in [facility] hospital lab) Nasopharyngeal Nasopharyngeal Swab     Status: None   Collection Time: 07/03/20  6:43 AM   Specimen: Nasopharyngeal Swab  Result Value Ref Range Status   SARS Coronavirus 2 NEGATIVE NEGATIVE Final    Comment: (NOTE) SARS-CoV-2 target nucleic acids are NOT DETECTED.  Performed at [facility], [place] [address], [place], [place] [address]     Radiology Reports DG Chest Portable 1  View  Result Date: 07/03/2020 CLINICAL DATA:  74 year old male, query pulmonary edema. EXAM: PORTABLE CHEST 1 VIEW COMPARISON:  Portable chest 05/21/2020 and earlier. FINDINGS: Portable AP semi upright view at 0021 hours. Chronic calcified left lung granuloma. Lung volumes and mediastinal contours are stable. Prior CABG. Stable left chest AICD. No pneumothorax, pulmonary edema, pleural effusion or acute pulmonary opacity. Visualized tracheal air column is within normal limits. No acute osseous abnormality identified. IMPRESSION: No acute cardiopulmonary abnormality. Electronically Signed   By: Genevie Ann M.D.   On: 07/03/2020 00:30   DG Foot Complete Left  Result Date: 07/02/2020 CLINICAL DATA:  Bilateral midfoot pain for 2 days, gout EXAM: LEFT FOOT - COMPLETE 3+ VIEW; RIGHT FOOT COMPLETE - 3+ VIEW COMPARISON:  04/02/2020, 05/21/2020 FINDINGS: Left foot: Frontal, oblique, and lateral views are obtained. Bones are diffusely osteopenic. No acute displaced fracture. 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 Stable calcaneal spurs. There is diffuse soft tissue edema. Right foot: Frontal, oblique, lateral views are obtained. Bones are diffusely osteopenic. No acute displaced fracture. 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 Stable calcaneal spurs. There is diffuse soft tissue edema. IMPRESSION: 1. Diffuse soft tissue edema of the bilateral feet. 2. No acute fractures. 3. 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 Electronically Signed   By: Randa Ngo M.D.   On: 07/02/2020 17:54   DG Foot Complete Right  Result Date: 07/02/2020 CLINICAL DATA:  Bilateral midfoot pain for 2 days, gout EXAM: LEFT FOOT - COMPLETE 3+ VIEW; RIGHT FOOT COMPLETE - 3+ VIEW COMPARISON:  04/02/2020, 05/21/2020 FINDINGS: Left foot: Frontal, oblique, and lateral views are obtained. Bones are diffusely osteopenic. No acute displaced fracture. 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 Stable  calcaneal spurs. There is diffuse soft tissue edema. Right foot: Frontal, oblique, lateral views are obtained. Bones are diffusely osteopenic. No acute displaced fracture. 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 Stable calcaneal spurs. There is diffuse soft tissue edema. IMPRESSION: 1. Diffuse soft tissue edema of the bilateral  feet. 2. No acute fractures. 3. 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 Electronically Signed   By: Randa Ngo M.D.   On: 07/02/2020 17:54    SIGNED: Deatra James, MD, FACP, FHM. [facility],  Pager (please use [email or web address] to page/text)  If 7PM-7AM, please contact night-coverage [email or web address], Password [facility] 07/04/2020, 12:12 PM

## 2020-07-04 NOTE — Progress Notes (Signed)
Occupational Therapy Evaluation Patient Details Name: Alexander Austin MRN: [id number] DOB: Nov 24, 1946 Today's Date: 07/04/2020    History of Present Illness Alexander Austin is a 74 y.o. male with medical history significant for hypertension, paroxysmal atrial fibrillation on Eliquis, chronic diastolic CHF, chronic kidney disease stage IIIa, history of CVA, and gout, now presenting to the emergency department with severe pain involving the bilateral feet.  Patient reports developing pain and swelling involving the bilateral feet on 06/30/2020, most severe in the bilateral first MTP joint, and consistent with his prior gout flares.  Pain has continued to worsen to the point where he is unable to bear any weight, lives alone, and has had difficulty attending to his ADLs due to this.     Clinical Impression   Patient lives alone in basement apartment.  Has landlords upstairs that are helpful, though, and has aide 5 days/weel 3 hours/day.  Needs assistance with home management and showering.  Today patient's biggest limitation is pain in B LE, especially R.  Able to get to EOB with min guard and stand with min assist and RW.  Walks keeping most of weight off R side which is increasing fatigue.  Presenting with poor activity tolerance and was SOB after walk from bathroom.  Will continue to follow with OT acutely to address the deficits listed below.      Follow Up Recommendations  SNF;Supervision - Intermittent (Likely can progress to HHOT if pain resolves)    Equipment Recommendations  None recommended by OT    Recommendations for Other Services       Precautions / Restrictions Precautions Precautions: Fall Restrictions Weight Bearing Restrictions: No      Mobility Bed Mobility Overal bed mobility: Needs Assistance Bed Mobility: Supine to Sit;Sit to Supine     Supine to sit: Min guard Sit to supine: Min guard   General bed mobility comments: Moves quickly  Transfers Overall transfer  level: Needs assistance Equipment used: Rolling walker (2 wheeled) Transfers: Sit to/from Stand Sit to Stand: Min assist              Balance Overall balance assessment: Needs assistance Sitting-balance support: Feet supported;No upper extremity supported Sitting balance-Leahy Scale: Fair     Standing balance support: Bilateral upper extremity supported;During functional activity Standing balance-Leahy Scale: Poor Standing balance comment: Keeps most of weight on R LE and needs external support                           ADL either performed or assessed with clinical judgement   ADL Overall ADL's : Needs assistance/impaired Eating/Feeding: Independent;Sitting   Grooming: Min guard;Standing   Upper Body Bathing: Set up;Supervision/ safety;Sitting   Lower Body Bathing: Moderate assistance;Sit to/from stand   Upper Body Dressing : Set up;Supervision/safety;Sitting   Lower Body Dressing: Moderate assistance;Sit to/from stand   Toilet Transfer: Minimal assistance;Regular Toilet;RW;Ambulation   Writer and Hygiene: Supervision/safety;Sitting/lateral lean       Functional mobility during ADLs: Min guard;Rolling walker;Cueing for safety General ADL Comments: Limited by pain     Vision Patient Visual Report: No change from baseline       Perception     Praxis      Pertinent Vitals/Pain Pain Assessment: 0-10 Pain Score: 8  Pain Location: B LE but R big toe is worst Pain Descriptors / Indicators: Grimacing;Discomfort;Aching Pain Intervention(s): Limited activity within patient's tolerance;Monitored during session     Hand Dominance Right  Extremity/Trunk Assessment Upper Extremity Assessment Upper Extremity Assessment: Generalized weakness   Lower Extremity Assessment Lower Extremity Assessment: Defer to PT evaluation (noted B LE pain)       Communication Communication Communication: No difficulties   Cognition  Arousal/Alertness: Awake/alert Behavior During Therapy: WFL for tasks assessed/performed Overall Cognitive Status: No family/caregiver present to determine baseline cognitive functioning                                 General Comments: Patient processing speed a little slow, needing clarification on a lot of questions.  Gave differing answers for same question about home set up.  Poor safety awareness   General Comments  Patient on RA and SpO2 decreased to 94 with mobility    Exercises     Shoulder Instructions      Home Living Family/patient expects to be discharged to:: Private residence Living Arrangements: Alone Available Help at Discharge: Personal care attendant (5 day/week 3 hours/day) Type of Home: Apartment Home Access: Level entry     Home Layout: Multi-level;Able to live on main level with bedroom/bathroom Alternate Level Stairs-Number of Steps: stair lift   Bathroom Shower/Tub: Occupational psychologist: Standard     Home Equipment: Shower seat - built in;Cane - Immunologist (rollator)   Additional Comments: Has 3  canes, 2 RWs and a scooter      Prior Functioning/Environment Level of Independence: Needs assistance  Gait / Transfers Assistance Needed: ambulates with a cane PRN ADL's / [place] Needed: Has an aide 5 days per week, 3 hours daily for housework/cleaning/laundry. Helps with showering, patient able to get dressed on own   Comments: Has landlords that live on main level of house who he considers family        OT Problem List: Decreased strength;Decreased activity tolerance;Impaired balance (sitting and/or standing);Decreased safety awareness;Decreased knowledge of use of DME or AE;Cardiopulmonary status limiting activity;Pain      OT Treatment/Interventions: Self-care/ADL training;Therapeutic exercise;DME and/or AE instruction;Therapeutic activities;Balance training;Patient/family education;Energy  conservation    OT Goals(Current goals can be found in the care plan section) Acute Rehab OT Goals Patient Stated Goal: To walk farther OT Goal Formulation: With patient Time For Goal Achievement: 07/18/20 Potential to Achieve Goals: Good  OT Frequency: Min 2X/week   Barriers to D/C: Decreased caregiver support          Co-evaluation              AM-PAC OT "6 Clicks" Daily Activity     Outcome Measure Help from another person eating meals?: None Help from another person taking care of personal grooming?: A Little Help from another person toileting, which includes using toliet, bedpan, or urinal?: A Little Help from another person bathing (including washing, rinsing, drying)?: A Lot Help from another person to put on and taking off regular upper body clothing?: A Little Help from another person to put on and taking off regular lower body clothing?: A Lot 6 Click Score: 17   End of Session Equipment Utilized During Treatment: Gait belt;Rolling walker Nurse Communication: Mobility status  Activity Tolerance: Patient tolerated treatment well;Patient limited by pain Patient left: in bed;with call bell/phone within reach;with bed alarm set  OT Visit Diagnosis: Unsteadiness on feet (R26.81);Muscle weakness (generalized) (M62.81);Pain Pain - Right/Left: Right Pain - part of body: Ankle and joints of foot  Time: 2277-3750 OT Time Calculation (min): 29 min Charges:  OT General Charges $OT Visit: 1 Visit OT Evaluation $OT Eval Moderate Complexity: 1 Mod OT Treatments $Self Care/Home Management : 8-22 mins  Alexander Austin, OTR/L   Phylliss Bob 07/04/2020, 9:18 AM

## 2020-07-05 LAB — CBC
HCT: 34.8 % — ABNORMAL LOW (ref 39.0–52.0)
Hemoglobin: 11.1 g/dL — ABNORMAL LOW (ref 13.0–17.0)
MCH: 27.8 pg (ref 26.0–34.0)
MCHC: 31.9 g/dL (ref 30.0–36.0)
MCV: 87.2 fL (ref 80.0–100.0)
Platelets: 166 10*3/uL (ref 150–400)
RBC: 3.99 MIL/uL — ABNORMAL LOW (ref 4.22–5.81)
RDW: 14.7 % (ref 11.5–15.5)
WBC: 5.8 10*3/uL (ref 4.0–10.5)
nRBC: 0 % (ref 0.0–0.2)

## 2020-07-05 LAB — BASIC METABOLIC PANEL
Anion gap: 11 (ref 5–15)
BUN: 36 mg/dL — ABNORMAL HIGH (ref 8–23)
CO2: 24 mmol/L (ref 22–32)
Calcium: 8.9 mg/dL (ref 8.9–10.3)
Chloride: 106 mmol/L (ref 98–111)
Creatinine, Ser: 1.66 mg/dL — ABNORMAL HIGH (ref 0.61–1.24)
GFR calc Af Amer: 46 mL/min — ABNORMAL LOW (ref >60)
GFR calc non Af Amer: 40 mL/min — ABNORMAL LOW (ref >60)
Glucose, Bld: 144 mg/dL — ABNORMAL HIGH (ref 70–99)
Potassium: 4.3 mmol/L (ref 3.5–5.1)
Sodium: 141 mmol/L (ref 135–145)

## 2020-07-05 MED ORDER — METHYLPREDNISOLONE SODIUM SUCC 125 MG IJ SOLR
60.0000 mg | Freq: Three times a day (TID) | INTRAMUSCULAR | Status: DC
  Administered 2020-07-05 – 2020-07-06 (×3): 60 mg via INTRAVENOUS
  Filled 2020-07-05 (×4): qty 2

## 2020-07-05 NOTE — Progress Notes (Signed)
PROGRESS NOTE    Patient: Alexander Austin                            PCP: Windy Fast, MD                    DOB: 03-07-1946            DOA: 07/02/2020 [id number]             DOS: 07/05/2020, 2:15 PM   LOS: 2 days   Date of Service: The patient was seen and examined on 07/05/2020  Subjective:   The patient was seen and examined this morning, stable awake alert oriented denies any chest pain or shortness of breath. -Reports significant pain in bilateral ankles, right more than left, he was unable to bear weight with PT yesterday even has significant pain.    Brief Narrative:   Alexander Austin is a 74 y.o. male with medical history significant for hypertension, paroxysmal atrial fibrillation on Eliquis, chronic diastolic CHF, chronic kidney disease stage IIIa, history of CVA, and gout, now presenting to the emergency department with severe pain involving the bilateral feet.  Patient reports developing pain and swelling involving the bilateral feet on 06/30/2020, most severe in the bilateral first MTP joint, and consistent with his prior gout flares.  Pain has continued to worsen to the point where he is unable to bear any weight, lives alone, and has had difficulty attending to his ADLs due his gout flareup.  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 -Continue home dose  colchicine 0.6 mg p.o. twice daily >> anticipated reduction in dose -As needed analgesics Dilaudid  does not have fever or leukocytosis, denies trauma, and plain films notable for edema only  - He continues to have severe pain and is unable to ambulate due to this despite aggressive treatment with IV Dilaudid in  -Monitoring uric acid -Patient has extensive history of gout but his allopurinol was stopped on the last hospitalization   2Chronic diastolic CHF  - Appears compensated  - Given a dose of IV Lasix  - On oral Lasix 40 twice   --- but holding next dose of Lasix, patient's creatinine elevated from baseline -Monitoring I's and O's -490,  Monitoring daily weight -Weight 104.1 kg (baseline, dry weight unknown)   Paroxysmal atrial fibrillation  -Remained stable, heart rate well controlled with current meds - CHADS-VASc is 6 (CVA x2, CAD, CHF, HTN, age)  - Continue Eliquis     Hypertension  -Monitoring BP closely -Home meds reviewed resumed accordingly Including Coreg, Lasix, hydralazine,  5CKD IIIa  - SCr is 1.50 in ED, >>> 1.95 - Renally-dose medications, monitor   -Avoiding nephrotoxins -With holding next dose of Lasix,  CAD  - No anginal complaints   Debility -Severe debility generalized  weakness, bilateral lower extremity edema weakness tenderness unable to ambulate, needs assist with all ADLs -PT/OT/social worker consulted appreciate evaluation recommendation   DVT prophylaxis: Eliquis  Code Status: Partial, no intubation  Family Communication: Discussed with patient  Disposition Plan:  Patient is from: Home  Anticipated d/c is to: TBD -likely home with home health versus SNF Anticipated d/c date is: 07/07/20 Patient currently: Having intractable pain, with swelling in the feet and lower extremity unable to ambulate, remains on IV steroids Consults called: None           Procedures:   No admission procedures for hospital  encounter. Antimicrobials:  Anti-infectives (From admission, onward)   None       Medication:  . amLODipine  10 mg Oral Daily  . apixaban  5 mg Oral BID  . aspirin EC  81 mg Oral Daily  . atorvastatin  80 mg Oral Daily  . calcitRIOL  0.25 mcg Oral Daily  . carvedilol  25 mg Oral BID WC  . colchicine  0.3 mg Oral BID  . ferrous sulfate  325 mg Oral Q M,W,F  . furosemide  40 mg Oral BID  . hydrALAZINE  50 mg Oral TID  . ketotifen  1 drop Both Eyes BID  . methylPREDNISolone (SOLU-MEDROL) injection  60 mg Intravenous Q12H  . pantoprazole  40 mg Oral Daily  . primidone  100 mg Oral BID  . sertraline  25 mg Oral QHS  . sodium chloride flush  3 mL Intravenous Q12H  . [START ON 07/06/2020] Vitamin D (Ergocalciferol)  50,000 Units Oral Q7 days    sodium chloride, acetaminophen **OR** acetaminophen, HYDROmorphone (DILAUDID) injection, ondansetron (ZOFRAN) IV, senna-docusate, sodium chloride flush   Objective:   Vitals:   07/04/20 2051 07/05/20 0444 07/05/20 0853 07/05/20 1004  BP: (!) 160/72 (!) 165/68  (!) 178/81  Pulse: 60 (!) 59    Resp: 18 20    Temp: 98.7 F (37.1 C) 97.7 F (36.5 C)    TempSrc: Oral Oral    SpO2: 95% 95%    Weight:   112.6 kg   Height:        Intake/Output Summary (Last 24 hours) at 07/05/2020 1415 Last data filed at 07/05/2020 0320 Gross per 24 hour  Intake 480 ml  Output 970 ml  Net -490 ml   Filed Weights   07/02/20 1652 07/04/20 0415 07/05/20 0853  Weight: 107 kg 104.1 kg 112.6 kg     Examination:     Physical Exam   Awake Alert, Oriented X 3, No new F.N deficits, Normal affect Symmetrical Chest wall movement, Good air movement bilaterally, CTAB RRR,No Gallops,Rubs or new Murmurs, No Parasternal Heave +ve B.Sounds, Abd Soft, No tenderness, No rebound - guarding or rigidity. No Cyanosis, Clubbing, +1 lower extremity edema, tenderness to palpation right > left ankle .       ------------------------------------------------------------------------------------------------------------------------------------------    LABs:  CBC Latest Ref Rng & Units 07/05/2020 07/04/2020 07/04/2020  WBC 4.0 - 10.5 K/uL 5.8 6.9 5.6  Hemoglobin 13.0 - 17.0 g/dL 11.1(L) 12.0(L) 10.8(L)  Hematocrit 39 - 52 % 34.8(L) 37.2(L) 33.9(L)  Platelets 150 - 400 K/uL 166 183 168   CMP Latest Ref Rng & Units 07/05/2020 07/04/2020 07/04/2020  Glucose 70 - 99 mg/dL 144(H) - 164(H)  BUN 8 - 23 mg/dL 36(H) - 35(H)  Creatinine 0.61 - 1.24 mg/dL 1.66(H) 1.95(H) 1.83(H)  Sodium 135 - 145 mmol/L 141 - 137  Potassium 3.5 - 5.1 mmol/L 4.3 -  4.0  Chloride 98 - 111 mmol/L 106 - 103  CO2 22 - 32 mmol/L 24 - 22  Calcium 8.9 - 10.3 mg/dL 8.9 - 8.7(L)  Total Protein 6.5 - 8.1 g/dL - - -  Total Bilirubin 0.3 - 1.2 mg/dL - - -  Alkaline Phos 38 - 126 U/L - - -  AST 15 - 41 U/L - - -  ALT 0 - 44 U/L - - -       Micro Results Recent Results (from the past 240 hour(s))  SARS Coronavirus 2 by RT PCR (hospital order, performed in [facility] hospital lab) Nasopharyngeal Nasopharyngeal Swab     Status: None   Collection Time: 07/03/20  6:43 AM   Specimen: Nasopharyngeal Swab  Result Value Ref Range Status   SARS Coronavirus 2 NEGATIVE NEGATIVE Final    Comment: (NOTE) SARS-CoV-2 target nucleic acids are NOT DETECTED.  Performed at [facility], [place] [address] [place], [place] [address]     Radiology Reports DG Chest Portable 1 View  Result Date: 07/03/2020 CLINICAL DATA:  74 year old male, query pulmonary edema. EXAM: PORTABLE CHEST 1 VIEW COMPARISON:  Portable chest 05/21/2020 and earlier. FINDINGS: Portable AP semi upright view at 0021 hours. Chronic calcified left lung granuloma. Lung volumes and mediastinal contours are stable. Prior CABG. Stable left chest AICD. No pneumothorax, pulmonary edema, pleural effusion or acute pulmonary opacity. Visualized tracheal air column is within normal  limits. No acute osseous abnormality identified. IMPRESSION: No acute cardiopulmonary abnormality. Electronically Signed   By: Genevie Ann M.D.   On: 07/03/2020 00:30   DG Foot Complete Left  Result Date: 07/02/2020 CLINICAL DATA:  Bilateral midfoot pain for 2 days, gout EXAM: LEFT FOOT - COMPLETE 3+ VIEW; RIGHT FOOT COMPLETE - 3+ VIEW COMPARISON:  04/02/2020, 05/21/2020 FINDINGS: Left foot: Frontal, oblique, and lateral views are obtained. Bones are diffusely osteopenic. No acute displaced fracture. 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 Stable calcaneal spurs. There is diffuse soft tissue edema. Right foot: Frontal, oblique, lateral views are obtained. Bones are diffusely osteopenic. No acute displaced fracture. 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 Stable calcaneal spurs. There is diffuse soft tissue edema. IMPRESSION: 1. Diffuse soft tissue edema of the bilateral feet. 2. No acute fractures. 3. 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 Electronically Signed   By: Randa Ngo M.D.   On: 07/02/2020 17:54   DG Foot Complete Right  Result Date: 07/02/2020 CLINICAL DATA:  Bilateral midfoot pain for 2 days, gout EXAM: LEFT FOOT - COMPLETE 3+ VIEW; RIGHT FOOT COMPLETE - 3+ VIEW COMPARISON:  04/02/2020, 05/21/2020 FINDINGS: Left foot: Frontal, oblique, and lateral views are obtained. Bones are diffusely osteopenic. No acute displaced fracture. 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 Stable calcaneal spurs. There is diffuse soft tissue edema. Right foot: Frontal, oblique, lateral views are obtained. Bones are diffusely osteopenic. No acute displaced fracture. 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 Stable calcaneal spurs. There is diffuse soft tissue edema. IMPRESSION: 1. Diffuse soft tissue edema of the bilateral feet. 2. No acute fractures. 3. 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 Electronically Signed   By: Randa Ngo  M.D.   On: 07/02/2020 17:54    SIGNED: Phillips Climes, [facility],  Pager (please use [email or web address] to page/text)  If 7PM-7AM, please contact night-coverage [email or web address] 07/05/2020, 2:15 PM

## 2020-07-05 NOTE — Evaluation (Signed)
Physical Therapy Evaluation Patient Details Name: Alexander Austin MRN: [id number] DOB: 1946/02/18 Today's Date: 07/05/2020   History of Present Illness  Alexander Austin is a 74 y.o. male with medical history significant for hypertension, paroxysmal atrial fibrillation on Eliquis, chronic diastolic CHF, chronic kidney disease stage IIIa, history of CVA, and gout, now presenting to the emergency department with severe pain involving the bilateral feet.  Patient reports developing pain and swelling involving the bilateral feet on 06/30/2020, most severe in the bilateral first MTP joint, and consistent with his prior gout flares.  Pain has continued to worsen to the point where he is unable to bear any weight, lives alone, and has had difficulty attending to his ADLs due to this.    Clinical Impression  Patient presents with mobility limited due to pain in feet R >L.  Patient currently minguard to S for mobility and feel should be able to d/c home safely when medically ready.  States aide helps and has supportive landlord.  Pain in feet is limiting but improving.  Feel continued skilled PT in the acute setting indicated to progress to home with follow up HHPT.    Follow Up Recommendations Home health PT    Equipment Recommendations  Wheelchair cushion (measurements PT)    Recommendations for Other Services       Precautions / Restrictions Precautions Precautions: Fall      Mobility  Bed Mobility         Supine to sit: Supervision     General bed mobility comments: for lines  Transfers Overall transfer level: Needs assistance Equipment used: Rolling walker (2 wheeled) Transfers: Sit to/from Stand Sit to Stand: Supervision         General transfer comment: for safety  Ambulation/Gait Ambulation/Gait assistance: Min guard;Supervision Gait Distance (Feet): 130 Feet Assistive device: Rolling walker (2 wheeled) Gait Pattern/deviations: Step-to pattern;Step-through  pattern;Antalgic;Decreased stride length;Decreased stance time - right     General Gait Details: limited distance due to pain, but pushing himself to walk much as he can, no physical help needed  Stairs            Wheelchair Mobility    Modified Rankin (Stroke Patients Only)       Balance Overall balance assessment: Needs assistance Sitting-balance support: Feet supported Sitting balance-Leahy Scale: Good     Standing balance support: Bilateral upper extremity supported Standing balance-Leahy Scale: Poor Standing balance comment: UE support on RW for standing and ambulation                             Pertinent Vitals/Pain Pain Assessment: Faces Faces Pain Scale: Hurts even more Pain Location: B LE but R big toe is worst Pain Descriptors / Indicators: Discomfort;Guarding;Grimacing Pain Intervention(s): Monitored during session;Repositioned    Home Living Family/patient expects to be discharged to:: Private residence Living Arrangements: Alone Available Help at Discharge: Personal care attendant (5  days a wekk, 3 hours a day) Type of Home: Apartment Home Access: Level entry     Home Layout: Multi-level;Able to live on main level with bedroom/bathroom Home Equipment: Shower seat - built in;Cane - Immunologist Additional Comments: Has 3  canes, 2 RWs and a scooter    Prior Function Level of Independence: Needs assistance   Gait / Transfers Assistance Needed: ambulates with a cane PRN  ADL's / [place] Needed: Has an aide 5 days per week, 3 hours daily for housework/cleaning/laundry. Helps with  showering, patient able to get dressed on own        Hand Dominance        Extremity/Trunk Assessment   Upper Extremity Assessment Upper Extremity Assessment: Defer to OT evaluation    Lower Extremity Assessment Lower Extremity Assessment: Generalized weakness       Communication   Communication: No difficulties   Cognition Arousal/Alertness: Awake/alert Behavior During Therapy: WFL for tasks assessed/performed Overall Cognitive Status: Within Functional Limits for tasks assessed                                        General Comments      Exercises     Assessment/Plan    PT Assessment Patient needs continued PT services  PT Problem List Decreased strength;Decreased mobility;Pain;Decreased activity tolerance;Decreased knowledge of precautions;Decreased knowledge of use of DME       PT Treatment Interventions DME instruction;Therapeutic activities;Gait training;Therapeutic exercise;Patient/family education;Stair training;Balance training;Functional mobility training    PT Goals (Current goals can be found in the Care Plan section)  Acute Rehab PT Goals Patient Stated Goal: To walk farther PT Goal Formulation: With patient Time For Goal Achievement: 07/19/20 Potential to Achieve Goals: Good    Frequency Min 3X/week   Barriers to discharge        Co-evaluation               AM-PAC PT "6 Clicks" Mobility  Outcome Measure Help needed turning from your back to your side while in a flat bed without using bedrails?: None Help needed moving from lying on your back to sitting on the side of a flat bed without using bedrails?: None Help needed moving to and from a bed to a chair (including a wheelchair)?: None Help needed standing up from a chair using your arms (e.g., wheelchair or bedside chair)?: A Little Help needed to walk in hospital room?: A Little Help needed climbing 3-5 steps with a railing? : A Little 6 Click Score: 21    End of Session   Activity Tolerance: Patient tolerated treatment well Patient left: in chair;with call bell/phone within reach   PT Visit Diagnosis: Difficulty in walking, not elsewhere classified (R26.2);Other abnormalities of gait and mobility (R26.89)    Time: 0802-2336 PT Time Calculation (min) (ACUTE ONLY): 33  min   Charges:   PT Evaluation $PT Eval Moderate Complexity: 1 Mod PT Treatments $Gait Training: 8-22 mins        Magda Kiel, PT Acute Rehabilitation Services PQAES:[phone] [phone] 07/05/2020   Alexander Austin 07/05/2020, 3:32 PM

## 2020-07-06 LAB — CBC
HCT: 35.3 % — ABNORMAL LOW (ref 39.0–52.0)
Hemoglobin: 11.5 g/dL — ABNORMAL LOW (ref 13.0–17.0)
MCH: 28.3 pg (ref 26.0–34.0)
MCHC: 32.6 g/dL (ref 30.0–36.0)
MCV: 86.9 fL (ref 80.0–100.0)
Platelets: 187 10*3/uL (ref 150–400)
RBC: 4.06 MIL/uL — ABNORMAL LOW (ref 4.22–5.81)
RDW: 14.6 % (ref 11.5–15.5)
WBC: 5.1 10*3/uL (ref 4.0–10.5)
nRBC: 0 % (ref 0.0–0.2)

## 2020-07-06 LAB — BASIC METABOLIC PANEL
Anion gap: 13 (ref 5–15)
BUN: 39 mg/dL — ABNORMAL HIGH (ref 8–23)
CO2: 23 mmol/L (ref 22–32)
Calcium: 8.7 mg/dL — ABNORMAL LOW (ref 8.9–10.3)
Chloride: 104 mmol/L (ref 98–111)
Creatinine, Ser: 1.6 mg/dL — ABNORMAL HIGH (ref 0.61–1.24)
GFR calc Af Amer: 48 mL/min — ABNORMAL LOW (ref >60)
GFR calc non Af Amer: 42 mL/min — ABNORMAL LOW (ref >60)
Glucose, Bld: 147 mg/dL — ABNORMAL HIGH (ref 70–99)
Potassium: 4 mmol/L (ref 3.5–5.1)
Sodium: 140 mmol/L (ref 135–145)

## 2020-07-06 MED ORDER — METHYLPREDNISOLONE SODIUM SUCC 40 MG IJ SOLR
40.0000 mg | Freq: Every day | INTRAMUSCULAR | Status: DC
  Administered 2020-07-07: 40 mg via INTRAVENOUS
  Filled 2020-07-06: qty 1

## 2020-07-06 NOTE — Progress Notes (Signed)
PROGRESS NOTE    Patient: Alexander Austin                            PCP: Windy Fast, MD                    DOB: Jan 12, 1946            DOA: 07/02/2020 [id number]             DOS: 07/06/2020, 3:38 PM   LOS: 3 days   Date of Service: The patient was seen and examined on 07/06/2020  Subjective:   The patient was seen and examined this morning, his ankle pain has significantly subsided after increasing his IV steroids dose, he was able to tolerate some weight yesterday with PT .  Brief Narrative:   Alexander Austin is a 74 y.o. male with medical history significant for hypertension, paroxysmal atrial fibrillation on Eliquis, chronic diastolic CHF, chronic kidney disease stage IIIa, history of CVA, and gout, now presenting to the emergency department with severe pain involving the bilateral feet.  Patient reports developing pain and swelling involving the bilateral feet on 06/30/2020, most severe in the bilateral first MTP joint, and consistent with his prior gout flares.  Pain has continued to worsen to the point where he is unable to bear any weight, lives alone, and has had difficulty attending to his ADLs due his gout flareup.  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 -Continue home dose colchicine 0.6 mg p.o. twice daily >>  anticipated reduction in dose -We will DC IV pain regimen, and continue with all regimen only  does not have fever or leukocytosis, denies trauma, and plain films notable for edema only  -Monitoring uric acid -Patient has extensive history of gout but his allopurinol was stopped on the last hospitalization   2Chronic diastolic CHF  - Appears compensated  - Given a dose of IV Lasix  - On oral Lasix 40 twice   --- but holding next dose of Lasix, patient's creatinine elevated from baseline -Monitoring I's and O's -490,  Monitoring daily weight -Weight 104.1 kg (baseline, dry weight unknown)   Paroxysmal atrial fibrillation  -Remained stable, heart rate well controlled with current meds - CHADS-VASc is 6 (CVA x2, CAD, CHF, HTN, age)  - Continue Eliquis     Hypertension  -Monitoring BP closely -Home meds reviewed resumed accordingly Including Coreg, Lasix, hydralazine,  5CKD IIIa  - SCr is 1.50 in ED, >>> 1.95 - Renally-dose medications, monitor   -Avoiding nephrotoxins -With holding next dose of Lasix,  CAD  - No anginal complaints   Debility -Severe debility generalized weakness, bilateral lower extremity edema weakness tenderness unable to ambulate, needs assist with all ADLs -PT/OT/social worker consulted appreciate  evaluation recommendation   DVT prophylaxis: Eliquis  Code Status: Partial, no intubation  Family Communication: Discussed with patient  Disposition Plan:  Patient is from: Home  Anticipated d/c is to: TBD -likely home with home health versus SNF Anticipated d/c date is: 07/07/20 Patient currently: Having intractable pain, with swelling in the feet and lower extremity unable to ambulate, remains on IV steroids Consults called: None           Procedures:   No admission procedures for hospital encounter. Antimicrobials:  Anti-infectives (From admission, onward)   None       Medication:  . amLODipine  10 mg Oral Daily  . apixaban  5 mg  Oral BID  . aspirin EC  81 mg Oral Daily  . atorvastatin  80 mg Oral Daily  . calcitRIOL  0.25 mcg Oral Daily  . carvedilol  25 mg Oral BID WC  . colchicine  0.3 mg Oral BID  . ferrous sulfate  325 mg Oral Q M,W,F  . furosemide  40 mg Oral BID  . hydrALAZINE  50 mg Oral TID  . ketotifen  1 drop Both Eyes BID  . [START ON 07/07/2020] methylPREDNISolone (SOLU-MEDROL) injection  40 mg Intravenous Daily  . pantoprazole  40 mg Oral Daily  . primidone  100 mg Oral BID  . sertraline  25 mg Oral QHS  . sodium chloride flush  3 mL Intravenous Q12H  . Vitamin D (Ergocalciferol)  50,000 Units Oral Q7 days    sodium chloride, acetaminophen **OR** acetaminophen, HYDROmorphone (DILAUDID) injection, ondansetron (ZOFRAN) IV, senna-docusate, sodium chloride flush   Objective:   Vitals:   07/05/20 2000 07/06/20 0633 07/06/20 0836 07/06/20 1508  BP: (!) 172/78 (!) 146/62 (!) 169/79 127/60  Pulse: 62 61  60  Resp: 14 15  18   Temp: (!) 97.3 F (36.3 C) 98 F (36.7 C)  97.9 F (36.6 C)  TempSrc: Oral Oral  Oral  SpO2: 100% 92%  96%  Weight:  112.4 kg    Height:        Intake/Output Summary (Last 24 hours) at 07/06/2020 1538 Last data filed at 07/06/2020 1320 Gross per 24 hour  Intake --  Output 1200 ml  Net -1200 ml   Filed Weights   07/04/20 0415 07/05/20 0853 07/06/20 0109  Weight: 104.1 kg 112.6 kg 112.4 kg     Examination:     Physical Exam   Awake Alert, Oriented X 3, No new F.N deficits, Normal affect Symmetrical Chest wall movement, Good air movement bilaterally, CTAB RRR,No Gallops,Rubs or new Murmurs, No Parasternal Heave +ve B.Sounds, Abd Soft, No tenderness, No rebound - guarding or rigidity. No Cyanosis, lower extremity edema has resolved, minimally tender to palpation in the right ankle, this has significantly improved from yesterday.        ------------------------------------------------------------------------------------------------------------------------------------------    LABs:  CBC Latest Ref Rng & Units 07/06/2020 07/05/2020 07/04/2020  WBC 4.0 - 10.5 K/uL 5.1 5.8 6.9  Hemoglobin 13.0 - 17.0 g/dL 11.5(L) 11.1(L) 12.0(L)  Hematocrit 39 - 52 % 35.3(L) 34.8(L) 37.2(L)  Platelets 150 - 400 K/uL 187 166 183   CMP Latest Ref Rng & Units 07/06/2020 07/05/2020 07/04/2020  Glucose 70 - 99 mg/dL 147(H) 144(H) -  BUN 8 - 23 mg/dL 39(H) 36(H) -  Creatinine 0.61 - 1.24 mg/dL 1.60(H) 1.66(H) 1.95(H)  Sodium 135 - 145 mmol/L 140 141 -  Potassium 3.5 - 5.1 mmol/L 4.0 4.3 -  Chloride 98 - 111 mmol/L 104 106 -  CO2 22 - 32 mmol/L 23 24 -  Calcium 8.9 - 10.3 mg/dL 8.7(L) 8.9 -  Total Protein 6.5 - 8.1 g/dL - - -  Total Bilirubin 0.3 - 1.2 mg/dL - - -  Alkaline Phos 38 - 126 U/L - - -  AST 15 - 41 U/L - - -  ALT 0 - 44 U/L - - -       Micro Results Recent Results (from the past 240 hour(s))  SARS Coronavirus 2 by RT PCR (hospital order, performed in [facility] hospital lab) Nasopharyngeal Nasopharyngeal Swab     Status: None   Collection Time: 07/03/20  6:43 AM   Specimen: Nasopharyngeal Swab  Result Value Ref Range Status   SARS Coronavirus 2 NEGATIVE NEGATIVE Final    Comment: (NOTE) SARS-CoV-2 target nucleic acids are NOT DETECTED.  Performed at [facility], [place] [address] [place], [place] [address]     Radiology Reports DG Chest Portable 1 View  Result Date: 07/03/2020 CLINICAL DATA:  74 year old male, query pulmonary edema. EXAM: PORTABLE CHEST 1 VIEW COMPARISON:  Portable chest 05/21/2020 and earlier. FINDINGS: Portable AP semi upright view at 0021 hours. Chronic calcified left lung granuloma. Lung volumes and mediastinal contours are stable. Prior CABG. Stable left chest AICD. No pneumothorax, pulmonary edema, pleural effusion or acute pulmonary opacity. Visualized tracheal air column is within normal  limits. No acute osseous abnormality identified. IMPRESSION: No acute cardiopulmonary abnormality. Electronically Signed   By: Genevie Ann M.D.   On: 07/03/2020 00:30   DG Foot Complete Left  Result Date: 07/02/2020 CLINICAL DATA:  Bilateral midfoot pain for 2 days, gout EXAM: LEFT FOOT - COMPLETE 3+ VIEW; RIGHT FOOT COMPLETE - 3+ VIEW COMPARISON:  04/02/2020, 05/21/2020 FINDINGS: Left foot: Frontal, oblique, and lateral views are obtained. Bones are diffusely osteopenic. No acute displaced fracture. 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 Stable calcaneal spurs. There is diffuse soft tissue edema. Right foot: Frontal, oblique, lateral views are obtained. Bones are diffusely osteopenic. No acute displaced fracture. 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 Stable calcaneal spurs. There is diffuse soft tissue edema. IMPRESSION: 1. Diffuse soft tissue edema of the bilateral feet. 2. No acute fractures. 3. 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 Electronically Signed   By: Randa Ngo M.D.   On: 07/02/2020 17:54   DG Foot Complete Right  Result Date: 07/02/2020 CLINICAL DATA:  Bilateral midfoot pain for 2 days, gout EXAM: LEFT FOOT - COMPLETE 3+ VIEW; RIGHT FOOT COMPLETE - 3+ VIEW COMPARISON:  04/02/2020, 05/21/2020 FINDINGS: Left foot: Frontal, oblique, and lateral views are obtained. Bones are diffusely osteopenic. No acute displaced fracture. 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 Stable calcaneal spurs. There is diffuse soft tissue edema. Right foot: Frontal, oblique, lateral views are obtained. Bones are diffusely osteopenic. No acute displaced fracture. 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 Stable calcaneal spurs. There is diffuse soft tissue edema. IMPRESSION: 1. Diffuse soft tissue edema of the bilateral feet. 2. No acute fractures. 3. 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 Electronically Signed   By: Randa Ngo  M.D.   On: 07/02/2020 17:54    SIGNED: Phillips Climes, [facility],  Pager (please use [email or web address] to page/text)  If 7PM-7AM, please contact night-coverage [email or web address] 07/06/2020, 3:38 PM

## 2020-07-07 MED ORDER — PREDNISONE 10 MG PO TABS
ORAL_TABLET | ORAL | 0 refills | Status: DC
Start: 2020-07-07 — End: 2020-07-07

## 2020-07-07 MED ORDER — PREDNISONE 10 MG PO TABS
ORAL_TABLET | ORAL | 0 refills | Status: DC
Start: 2020-07-07 — End: 2022-09-16

## 2020-07-07 MED ORDER — COLCHICINE 0.6 MG PO TABS: 0.3000 mg | ORAL_TABLET | Freq: Two times a day (BID) | ORAL | 3 refills | Status: DC

## 2020-07-07 NOTE — Progress Notes (Signed)
Occupational Therapy Treatment Patient Details Name: Alexander Austin MRN: [id number] DOB: [birth year] Today's Date: 07/07/2020    History of present illness Alexander Austin is a 74 y.o. male with medical history significant for hypertension, paroxysmal atrial fibrillation on Eliquis, chronic diastolic CHF, chronic kidney disease stage IIIa, history of CVA, and gout, now presenting to the emergency department with severe pain involving the bilateral feet.  Patient reports developing pain and swelling involving the bilateral feet on 06/30/2020, most severe in the bilateral first MTP joint, and consistent with his prior gout flares.  Pain has continued to worsen to the point where he is unable to bear any weight, lives alone, and has had difficulty attending to his ADLs due to this.     OT comments  Patient up in chair on arrival.  He reported no foot pain and was able to stand with mod independence and RW.  Walked to bathroom with supervision and toileted with supervision.  Answered patient's questions abotu discharge and discussed safe walker use at home.  Patient will have assist if needed at home.  Will continue to follow with OT acutely to address the deficits listed below.    Follow Up Recommendations  Home health OT;Supervision - Intermittent    Equipment Recommendations  None recommended by OT    Recommendations for Other Services      Precautions / Restrictions Precautions Precautions: Fall       Mobility Bed Mobility Overal bed mobility: Modified Independent Bed Mobility: Supine to Sit     Supine to sit: Modified independent (Device/Increase time)     General bed mobility comments: Up in chair   Transfers Overall transfer level: Modified independent Equipment used: Rolling walker (2 wheeled) Transfers: Sit to/from Stand Sit to Stand: Modified independent (Device/Increase time)         General transfer comment: proper use of RW    Balance Overall balance assessment:  Needs assistance Sitting-balance support: Feet supported Sitting balance-Leahy Scale: Good     Standing balance support: No upper extremity supported Standing balance-Leahy Scale: Fair Standing balance comment: for dynamic balance activities, at least single UE support                           ADL either performed or assessed with clinical judgement   ADL Overall ADL's : Needs assistance/impaired                         Toilet Transfer: Supervision/safety;Regular Toilet;Grab bars;RW;Ambulation   Toileting- Clothing Manipulation and Hygiene: Supervision/safety;Sitting/lateral lean       Functional mobility during ADLs: Supervision/safety;Rolling walker;Cueing for safety       Vision       Perception     Praxis      Cognition Arousal/Alertness: Awake/alert Behavior During Therapy: WFL for tasks assessed/performed Overall Cognitive Status: Within Functional Limits for tasks assessed                                 General Comments: Presents with poor safet awareness        Exercises     Shoulder Instructions       General Comments     Pertinent Vitals/ Pain       Pain Assessment: No/denies pain  Home Living  Prior Functioning/Environment              Frequency  Min 2X/week        Progress Toward Goals  OT Goals(current goals can now be found in the care plan section)  Progress towards OT goals: Progressing toward goals  Acute Rehab OT Goals Patient Stated Goal: To walk farther OT Goal Formulation: With patient Time For Goal Achievement: 07/18/20 Potential to Achieve Goals: Good  Plan Discharge plan needs to be updated    Co-evaluation                 AM-PAC OT "6 Clicks" Daily Activity     Outcome Measure   Help from another person eating meals?: None Help from another person taking care of personal grooming?: A Little Help from  another person toileting, which includes using toliet, bedpan, or urinal?: A Little Help from another person bathing (including washing, rinsing, drying)?: A Little Help from another person to put on and taking off regular upper body clothing?: A Little Help from another person to put on and taking off regular lower body clothing?: A Little 6 Click Score: 19    End of Session Equipment Utilized During Treatment: Rolling walker  OT Visit Diagnosis: Unsteadiness on feet (R26.81);Muscle weakness (generalized) (M62.81)   Activity Tolerance Patient tolerated treatment well   Patient Left in chair;with call bell/phone within reach   Nurse Communication Mobility status        Time: 3254-9826 OT Time Calculation (min): 12 min  Charges: OT General Charges $OT Visit: 1 Visit OT Treatments $Self Care/Home Management : 8-22 mins  August Luz, OTR/L    Phylliss Bob 07/07/2020, 11:12 AM

## 2020-07-07 NOTE — Discharge Summary (Signed)
Physician Discharge Summary  ION GONNELLA ION:[id number] DOB: 05-17-1946 DOA: 07/02/2020  PCP: Windy Fast, MD  Admit date: 07/02/2020 Discharge date: 07/07/2020  Admitted From: Home  Disposition:  Home with [facility]  Recommendations for Outpatient Follow-up:  1. Follow up with PCP in 1-2 weeks     Home Health: PT/OT/RN due to pain with ambulation  Equipment/Devices: None  Discharge Condition: Fair  CODE STATUS: FULL Diet recommendation: Cardiac  Brief/Interim Summary: Alexander Austin a 74 y.o.malewith medical history significant forhypertension, paroxysmal atrial fibrillation on Eliquis, chronic diastolic CHF, chronic kidney disease stage IIIa, history of CVA, and gout, now presenting to the emergency department with severe pain involving the bilateral feet.   This pain developed over 1-2 days prior to admission, with pain and swelling involving the bilateral feet, most severe in the bilateral first MTP joint, and consistent with his prior gout flares. Pain continued to worsen to the point where he was unable to bear any weight, lives alone, and has had difficulty attending to his ADLs due his gout flareup.      PRINCIPAL HOSPITAL DIAGNOSIS: Gout flare    Discharge Diagnoses:   Acute gouty arthritis Patient started on steroids.  His foot pain improved.  He was treated with steroids for 4 days, improved, and was discharged home to quickly taper off.  Resume allopurinol when flare resolved.   Paroxysmal atrial fibrillation  Chronic diastolic CHF  Chronic kidney disease, stage IIIa  History of stroke      Discharge Instructions  Discharge Instructions    Diet - low sodium heart healthy   Complete by: As directed    Discharge instructions   Complete by: As directed    From Dr. Loleta Books: You were admitted with Gout.  Take prednisone 30 mg (3 tabs) tomorrow (Wednesday) Then take 20 mg (2 tabs) on Thursday Then take 10 mg (1 tab) on Friday then stop   GO  see your primary in 1 week Reduce your colchicine dose (this is because of your kidney function) Don't restart allopurinol until you see your primary care doctor  STOP taking Plavix   Increase activity slowly   Complete by: As directed      Allergies as of 07/07/2020      Reactions   Isosorbide Nitrate Anaphylaxis   Other reaction(s): Cardiovascular Arrest (ALLERGY/intolerance) Can take Sublingual Nitro.   Shellfish-derived Products Other (See Comments)   Patient states shellfish triggers his gout      Medication List    STOP taking these medications   allopurinol 100 MG tablet Commonly known as: ZYLOPRIM   clopidogrel 75 MG tablet Commonly known as: PLAVIX     TAKE these medications   amLODipine 10 MG tablet Commonly known as: NORVASC Take 10 mg by mouth daily.   apixaban 5 MG Tabs tablet Commonly known as: ELIQUIS Take 5 mg by mouth 2 (two) times daily.   aspirin EC 81 MG tablet Take 81 mg by mouth daily.   atorvastatin 80 MG tablet Commonly known as: LIPITOR Take 80 mg by mouth daily.   calcitRIOL 0.25 MCG capsule Commonly known as: ROCALTROL Take 0.25 mcg by mouth daily.   carvedilol 25 MG tablet Commonly known as: COREG Take 25 mg by mouth in the morning and at bedtime.   colchicine 0.6 MG tablet Take 0.5 tablets (0.3 mg total) by mouth 2 (two) times daily. What changed: how much to take   diclofenac Sodium 1 % Gel Commonly known as: VOLTAREN Apply 2 g  topically 4 (four) times daily.   ferrous sulfate 325 (65 FE) MG tablet Take 325 mg by mouth every Monday, Wednesday, and Friday.   furosemide 40 MG tablet Commonly known as: LASIX Take 1 tablet (40 mg total) by mouth daily. What changed: when to take this   hydrALAZINE 50 MG tablet Commonly known as: APRESOLINE Take 1 tablet (50 mg total) by mouth 3 (three) times daily.   ketotifen 0.025 % ophthalmic solution Commonly known as: ZADITOR Place 1 drop into both eyes 2 (two) times daily.    pantoprazole 40 MG tablet Commonly known as: PROTONIX Take 40 mg by mouth daily.   predniSONE 10 MG tablet Commonly known as: DELTASONE Take prednisone 30 mg (3 tabs) on Tuesday, take prednisone 20 mg (2 tabs) on Weds, and then take 10 mg (1 tab) on Thu and stop What changed: additional instructions   primidone 50 MG tablet Commonly known as: MYSOLINE Take 100 mg by mouth in the morning and at bedtime.   sertraline 50 MG tablet Commonly known as: ZOLOFT Take 25 mg by mouth at bedtime.   Vitamin D (Ergocalciferol) 1.25 MG (50000 UNIT) Caps capsule Commonly known as: DRISDOL Take 50,000 Units by mouth every 7 (seven) days. Sunday       Follow-up Information    [facility] [place] Follow up.   Why: Home health resumed. Contact [name] [phone] for concerns.  Contact information: [place] [address] [phone]        Windy Fast, MD. Schedule an appointment as soon as possible for a visit in 1 week(s).   Specialty: Internal Medicine Contact information: [address] [place] [address] [phone]              Allergies  Allergen Reactions  . Isosorbide Nitrate Anaphylaxis    Other reaction(s): Cardiovascular Arrest (ALLERGY/intolerance) Can take Sublingual Nitro.   Gardiner F[employer] Products Other (See Comments)    Patient states shellfish triggers his gout    Consultations:     Procedures/Studies: DG Chest Portable 1 View  Result Date: 07/03/2020 CLINICAL DATA:  74 year old male, query pulmonary edema. EXAM: PORTABLE CHEST 1 VIEW COMPARISON:  Portable chest 05/21/2020 and earlier. FINDINGS: Portable AP semi upright view at 0021 hours. Chronic calcified left lung granuloma. Lung volumes and mediastinal contours are stable. Prior CABG. Stable left chest AICD. No pneumothorax, pulmonary edema, pleural effusion or acute pulmonary opacity. Visualized tracheal air column is within normal limits. No acute osseous  abnormality identified. IMPRESSION: No acute cardiopulmonary abnormality. Electronically Signed   By: Genevie Ann M.D.   On: 07/03/2020 00:30   DG Foot Complete Left  Result Date: 07/02/2020 CLINICAL DATA:  Bilateral midfoot pain for 2 days, gout EXAM: LEFT FOOT - COMPLETE 3+ VIEW; RIGHT FOOT COMPLETE - 3+ VIEW COMPARISON:  04/02/2020, 05/21/2020 FINDINGS: Left foot: Frontal, oblique, and lateral views are obtained. Bones are diffusely osteopenic. No acute displaced fracture. 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 Stable calcaneal spurs. There is diffuse soft tissue edema. Right foot: Frontal, oblique, lateral views are obtained. Bones are diffusely osteopenic. No acute displaced fracture. 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 Stable calcaneal spurs. There is diffuse soft tissue edema. IMPRESSION: 1. Diffuse soft tissue edema of the bilateral feet. 2. No acute fractures. 3. 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 Electronically Signed   By: Randa Ngo M.D.   On: 07/02/2020 17:54   DG Foot Complete Right  Result Date: 07/02/2020 CLINICAL DATA:  Bilateral midfoot pain for 2 days, gout EXAM: LEFT FOOT - COMPLETE 3+ VIEW; RIGHT FOOT COMPLETE - 3+ VIEW COMPARISON:  04/02/2020, 05/21/2020 FINDINGS: Left foot: Frontal, oblique, and lateral views are obtained. Bones are diffusely osteopenic. No acute displaced fracture. 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 Stable calcaneal spurs. There is diffuse soft tissue edema. Right foot: Frontal, oblique, lateral views are obtained. Bones are diffusely osteopenic. No acute displaced fracture. 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 Stable calcaneal spurs. There is diffuse soft tissue edema. IMPRESSION: 1. Diffuse soft tissue edema of the bilateral feet. 2. No acute fractures. 3. 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 Electronically Signed   By: Randa Ngo M.D.   On: 07/02/2020  17:54       Subjective: Patient's foot pain is improved.  He has no fever, no joint pain or swelling, confusion.  Discharge Exam: Vitals:   07/07/20 0510 07/07/20 0748  BP: (!) 152/56 (!) 172/66  Pulse: (!) 56   Resp: 18 16  Temp: 97.6 F (36.4 C) 98.6 F (37 C)  SpO2: 93% 96%   Vitals:   07/06/20 2055 07/07/20 0500 07/07/20 0510 07/07/20 0748  BP: (!) 150/58  (!) 152/56 (!) 172/66  Pulse: 60  (!) 56   Resp: 18  18 16   Temp: 98 F (36.7 C)  97.6 F (36.4 C) 98.6 F (37 C)  TempSrc: Oral  Oral Oral  SpO2: 95%  93% 96%  Weight:  111.4 kg    Height:        General: Pt is alert, awake, not in acute distress Cardiovascular: RRR, nl S1-S2, no murmurs appreciated.   No LE edema.   Respiratory: Normal respiratory rate and rhythm.  CTAB without rales or wheezes. MSK: Bilateral feet are without redness, shininess, swelling, or pain to palpation of the first MTP Abdominal: Abdomen soft and non-tender.  No distension or HSM.   Neuro/Psych: Strength symmetric in upper and lower extremities.  Judgment and insight appear normal.   The results of significant diagnostics from this hospitalization (including imaging, microbiology, ancillary and laboratory) are listed below for reference.     Microbiology: Recent Results (from the past 240 hour(s))  SARS Coronavirus 2 by RT PCR (hospital order, performed in [facility] hospital lab) Nasopharyngeal Nasopharyngeal Swab     Status: None   Collection Time: 07/03/20  6:43 AM   Specimen: Nasopharyngeal Swab  Result Value Ref Range Status   SARS Coronavirus 2 NEGATIVE NEGATIVE Final    Comment: (NOTE) SARS-CoV-2 target nucleic acids are NOT DETECTED.  The SARS-CoV-2 RNA is generally detectable in upper and lower respiratory specimens during the acute phase of infection. The lowest concentration of SARS-CoV-2 viral copies this assay can detect is 250 copies / mL. A negative result does not preclude SARS-CoV-2 infection and should not  be used as the sole basis for treatment or other patient management decisions.  A negative result may occur with improper specimen collection / handling, submission of specimen other than nasopharyngeal swab, presence of viral mutation(s) within the areas targeted by this assay, and inadequate number of viral copies (<250 copies / mL). A negative result must be combined with clinical observations, patient history, and epidemiological information.  Fact Sheet for Patients:   StrictlyIdeas.no  Fact Sheet for Healthcare Providers: BankingDealers.co.za  This test is not yet approved or  cleared by the [place] FDA and has been authorized for detection and/or diagnosis of SARS-CoV-2 by FDA under an Emergency  Use Authorization (EUA).  This EUA will remain in effect (meaning this test can be used) for the duration of the COVID-19 declaration under Section 564(b)(1) of the Act, 21 U.S.C. section 360bbb-3(b)(1), unless the authorization is terminated or revoked sooner.  Performed at [facility], [place] [address] [place], [place] [address]      Labs: BNP (last 3 results) Recent Labs    03/28/20 1235 05/21/20 1135 07/03/20 0006  BNP 274.0* 198.6* 638.4*   Basic Metabolic Panel: Recent Labs  Lab 07/02/20 1722 07/04/20 0432 07/04/20 0905 07/05/20 0731 07/06/20 0105  NA 141 137  --  141 140  K 3.8 4.0  --  4.3 4.0  CL 105 103  --  106 104  CO2 25 22  --  24 23  GLUCOSE 98 164*  --  144* 147*  BUN 16 35*  --  36* 39*  CREATININE 1.50* 1.83* 1.95* 1.66* 1.60*  CALCIUM 9.1 8.7*  --  8.9 8.7*   Liver Function Tests: No results for input(s): AST, ALT, ALKPHOS, BILITOT, PROT, ALBUMIN in the last 168 hours. No results for input(s): LIPASE, AMYLASE in the last 168 hours. No results for input(s): AMMONIA in the last 168 hours. CBC: Recent Labs  Lab 07/02/20 1722 07/04/20 0432 07/04/20 0905 07/05/20 0731 07/06/20 0105   WBC 5.6 5.6 6.9 5.8 5.1  NEUTROABS 4.4  --   --   --   --   HGB 12.7* 10.8* 12.0* 11.1* 11.5*  HCT 40.2 33.9* 37.2* 34.8* 35.3*  MCV 87.2 86.5 87.1 87.2 86.9  PLT 186 168 183 166 187   Cardiac Enzymes: No results for input(s): CKTOTAL, CKMB, CKMBINDEX, TROPONINI in the last 168 hours. BNP: Invalid input(s): POCBNP CBG: No results for input(s): GLUCAP in the last 168 hours. D-Dimer No results for input(s): DDIMER in the last 72 hours. Hgb A1c No results for input(s): HGBA1C in the last 72 hours. Lipid Profile No results for input(s): CHOL, HDL, LDLCALC, TRIG, CHOLHDL, LDLDIRECT in the last 72 hours. Thyroid function studies No results for input(s): TSH, T4TOTAL, T3FREE, THYROIDAB in the last 72 hours.  Invalid input(s): FREET3 Anemia work up No results for input(s): VITAMINB12, FOLATE, FERRITIN, TIBC, IRON, RETICCTPCT in the last 72 hours. Urinalysis    Component Value Date/Time   COLORURINE YELLOW 03/07/2020 0845   APPEARANCEUR CLEAR 03/07/2020 0845   LABSPEC 1.025 03/07/2020 0845   PHURINE 5.5 03/07/2020 0845   GLUCOSEU NEGATIVE 03/07/2020 0845   HGBUR SMALL (A) 03/07/2020 0845   BILIRUBINUR NEGATIVE 03/07/2020 0845   KETONESUR NEGATIVE 03/07/2020 0845   PROTEINUR NEGATIVE 03/07/2020 0845   NITRITE NEGATIVE 03/07/2020 0845   LEUKOCYTESUR NEGATIVE 03/07/2020 0845   Sepsis Labs Invalid input(s): PROCALCITONIN,  WBC,  LACTICIDVEN Microbiology Recent Results (from the past 240 hour(s))  SARS Coronavirus 2 by RT PCR (hospital order, performed in [place] hospital lab) Nasopharyngeal Nasopharyngeal Swab     Status: None   Collection Time: 07/03/20  6:43 AM   Specimen: Nasopharyngeal Swab  Result Value Ref Range Status   SARS Coronavirus 2 NEGATIVE NEGATIVE Final    Comment: (NOTE) SARS-CoV-2 target nucleic acids are NOT DETECTED.  The SARS-CoV-2 RNA is generally detectable in upper and lower respiratory specimens during the acute phase of infection. The  lowest concentration of SARS-CoV-2 viral copies this assay can detect is 250 copies / mL. A negative result does not preclude SARS-CoV-2 infection and should not be used as the sole basis for treatment or other  patient management decisions.  A negative result may occur with improper specimen collection / handling, submission of specimen other than nasopharyngeal swab, presence of viral mutation(s) within the areas targeted by this assay, and inadequate number of viral copies (<250 copies / mL). A negative result must be combined with clinical observations, patient history, and epidemiological information.  Fact Sheet for Patients:   StrictlyIdeas.no  Fact Sheet for Healthcare Providers: BankingDealers.co.za  This test is not yet approved or  cleared by the [place] FDA and has been authorized for detection and/or diagnosis of SARS-CoV-2 by FDA under an Emergency Use Authorization (EUA).  This EUA will remain in effect (meaning this test can be used) for the duration of the COVID-19 declaration under Section 564(b)(1) of the Act, 21 U.S.C. section 360bbb-3(b)(1), unless the authorization is terminated or revoked sooner.  Performed at [facility], [place] [address] [place], [place] [address]      Time coordinating discharge: 25 minutes The [place] controlled substances registry was reviewed for this patient        SIGNED:   Edwin Dada, MD  [facility] 07/07/2020, 9:35 PM

## 2020-07-07 NOTE — TOC Transition Note (Signed)
Transition of Care [facility]) - CM/SW Discharge Note   Patient Details  Name: Alexander Austin MRN: [id number] Date of Birth: 30-Aug-1946  Transition of [facility] CM/SW Contact:  Curlene Labrum, RN Phone Number: 07/07/2020, 9:41 AM   Clinical Narrative:     Case management spoke with the patient this morning and patient is planning to discharge today.  The patient is having his landlord pick him up for transport to home.  I called the patient and [facility] and confirmed that the patient will be receiving continued services through [facility] including RN, PT,OT with no aide at patient's request.  The patient receives aide services through another company and will not need duplicate services.  The patient received his wheelchair for home.  No other needs from [facility] at this time.  Will continue to follow.    Barriers to Discharge: Continued Medical Work up   Patient Goals and CMS Choice Patient states their goals for this hospitalization and ongoing recovery are:: Return home CMS Medicare.gov Compare Post Acute Care list provided to:: Patient Choice offered to / list presented to : Patient  Discharge Placement                       Discharge Plan and Services In-house Referral: Clinical Social Work Discharge Planning Services: CM Consult Post Acute Care Choice: Home Health          DME Arranged: N/A DME Agency: NA       HH Arranged: PT, OT, RN [place] Agency: [place] Date [place]: 07/04/20 Time [place]: 1311 Representative spoke with at [place]: [place] ([place]) Interventions     Readmission Risk Interventions Readmission Risk Prevention Plan 07/04/2020 04/03/2020  Transportation Screening Complete Complete  PCP or Specialist Appt within 3-5 Days - Complete  HRI or [place] - Complete  Social Work Consult for [place] Planning/Counseling - Complete  Palliative Care Screening - Not  Applicable  Medication Review Press photographer) Referral to Pharmacy Complete  PCP or Specialist appointment within 3-5 days of discharge Complete -  [place] or Home Care Consult Complete -  SW Recovery Care/Counseling Consult Complete -  [place] Patient Refused -  Some recent data might be hidden

## 2020-07-07 NOTE — Progress Notes (Signed)
Physical Therapy Treatment Patient Details Name: Alexander Austin MRN: [id number] DOB: 02/17/1946 Today's Date: 07/07/2020    History of Present Illness Alexander Austin is a 74 y.o. male with medical history significant for hypertension, paroxysmal atrial fibrillation on Eliquis, chronic diastolic CHF, chronic kidney disease stage IIIa, history of CVA, and gout, now presenting to the emergency department with severe pain involving the bilateral feet.  Patient reports developing pain and swelling involving the bilateral feet on 06/30/2020, most severe in the bilateral first MTP joint, and consistent with his prior gout flares.  Pain has continued to worsen to the point where he is unable to bear any weight, lives alone, and has had difficulty attending to his ADLs due to this.      PT Comments    Patient eager to be discharged today by noon (in order for his landlord to have time to pick him up). He demonstrated mobility at modified independent level (using RW to improve his balance and reduce fall risk). Has met PT goals. Agree with HHPT to address wheelchair education (MD previously ordered wheelchair for pt to have at home to use when gout flare-ups occur). Pt reports wheelchair was delivered while he was in the hospital. Pt agrees with this plan. OT notified of patient planned discharge, lives alone, and has not yet been seen by OT.    Follow Up Recommendations  Home health PT     Equipment Recommendations  Wheelchair cushion (measurements PT) (basic cushion for during gout flare-ups (pt received w/c))    Recommendations for Other Services       Precautions / Restrictions Precautions Precautions: Fall    Mobility  Bed Mobility Overal bed mobility: Modified Independent Bed Mobility: Supine to Sit     Supine to sit: Modified independent (Device/Increase time)     General bed mobility comments: pt has adjustable bed at home;   Transfers Overall transfer level: Modified  independent Equipment used: Rolling walker (2 wheeled) Transfers: Sit to/from Stand Sit to Stand: Modified independent (Device/Increase time)         General transfer comment: proper use of RW  Ambulation/Gait Ambulation/Gait assistance: Supervision;Modified independent (Device/Increase time) Gait Distance (Feet): 180 Feet Assistive device: Rolling walker (2 wheeled) Gait Pattern/deviations: Step-through pattern;Decreased stride length;Trunk flexed   Gait velocity interpretation: 1.31 - 2.62 ft/sec, indicative of limited community ambulator General Gait Details: vc for proximity to RW and upright posture; pt reports he only uses RW when feet hurting   Theme park manager mobility:  (pt reports having w/c delivered recently; MD ordered for pt during recent hospitalization)  Modified Rankin (Stroke Patients Only)       Balance Overall balance assessment: Needs assistance Sitting-balance support: Feet supported Sitting balance-Leahy Scale: Good     Standing balance support: No upper extremity supported Standing balance-Leahy Scale: Fair Standing balance comment: for dynamic balance activities, at least single UE support                            Cognition Arousal/Alertness: Awake/alert Behavior During Therapy: WFL for tasks assessed/performed Overall Cognitive Status: Within Functional Limits for tasks assessed                                        Exercises  General Comments General comments (skin integrity, edema, etc.): Notified OT re: pending d/c. Lives alone, has aide      Pertinent Vitals/Pain Pain Assessment: No/denies pain    Home Living                      Prior Function            PT Goals (current goals can now be found in the care plan section) Acute Rehab PT Goals Patient Stated Goal: To walk farther PT Goal Formulation: With patient Time For  Goal Achievement: 07/19/20 Potential to Achieve Goals: Good Progress towards PT goals: Goals met/education completed, patient discharged from PT    Frequency    Min 3X/week      PT Plan Current plan remains appropriate    Co-evaluation              AM-PAC PT "6 Clicks" Mobility   Outcome Measure  Help needed turning from your back to your side while in a flat bed without using bedrails?: None Help needed moving from lying on your back to sitting on the side of a flat bed without using bedrails?: None Help needed moving to and from a bed to a chair (including a wheelchair)?: None Help needed standing up from a chair using your arms (e.g., wheelchair or bedside chair)?: None Help needed to walk in hospital room?: None Help needed climbing 3-5 steps with a railing? : A Little 6 Click Score: 23    End of Session   Activity Tolerance: Patient tolerated treatment well Patient left: in chair;with call bell/phone within reach Nurse Communication: Mobility status;Other (comment) (OK for d/c; OT notified to see pt) PT Visit Diagnosis: Difficulty in walking, not elsewhere classified (R26.2);Other abnormalities of gait and mobility (R26.89)     Time: 5051-8335 PT Time Calculation (min) (ACUTE ONLY): 11 min  Charges:  $Gait Training: 8-22 mins                      Arby Barrette, PT Pager [phone]    Rexanne Mano 07/07/2020, 9:20 AM

## 2020-07-07 NOTE — Progress Notes (Signed)
Nance Pear to be D/C'd Home per MD order.  Discussed with the patient and all questions fully answered.  VSS, Skin clean, dry and intact without evidence of skin break down, no evidence of skin tears noted. IV catheter discontinued intact. Site without signs and symptoms of complications. Dressing and pressure applied.  An After Visit Summary was printed and given to the patient.  D/c education completed with patient including follow up instructions, medication list, d/c activities limitations if indicated, with other d/c instructions as indicated by MD - patient able to verbalize understanding, all questions fully answered.   Patient instructed to return to ED, call 911, or call MD for any changes in condition.   Patient escorted via [place], and D/C home via private auto.  Jeanella Craze 07/07/2020 12:02 PM

## 2020-08-18 MED FILL — Colchicine Tab 0.6 MG: ORAL | Qty: 0.3 | Status: AC

## 2021-12-05 IMAGING — DX DG CHEST 1V PORT
1 series · 1 of 1 positions shown · non-contrast
Comparison: Radiograph May 10, 2019,January 26, 2019

CLINICAL DATA: Shortness of breath, fatigue, gout in the left foot

EXAM:
PORTABLE CHEST 1 VIEW

[chest ap]
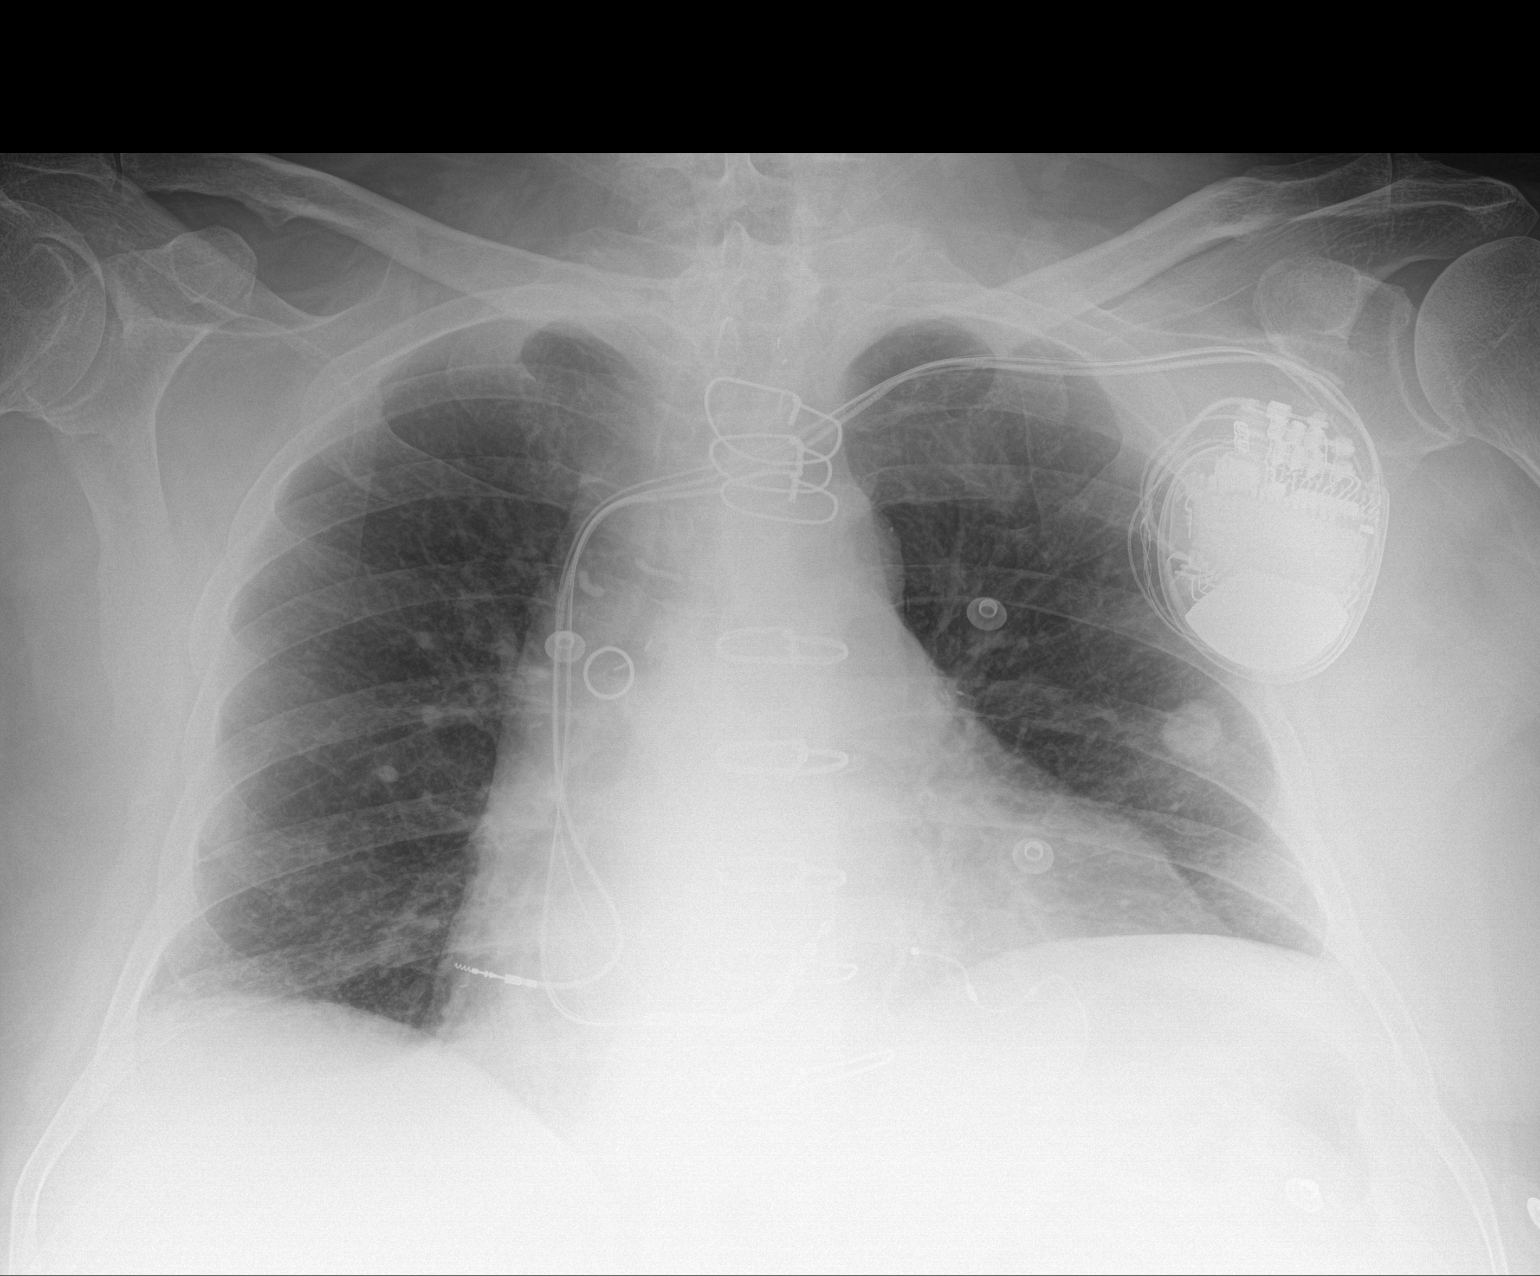

[1 of 1 positions shown; findings below may reference images not displayed]

FINDINGS: Stable appearance of the scattered calcified granulomata including a
larger granuloma in the left lower lung measuring up to 1.4 cm in
size. Some streaky opacities in the lung bases favor atelectasis
given low volumes. No consolidation, features of edema,
pneumothorax, or effusion. Cardiomegaly is similar to prior.
Postsurgical changes related to prior CABG including intact and
aligned sternotomy wires and multiple surgical clips projecting over
the mediastinum. Pacer/defibrillator pack overlies the left chest
wall with leads at the right atrium, apex and coronary sinus. No
acute osseous or soft tissue abnormality.
IMPRESSION: 1. Low lung volumes with streaky opacities in the lung bases
favoring atelectasis.
2. Stable cardiomegaly.
3. Stable appearance of calcified granulomata.

## 2022-02-18 IMAGING — DX DG CHEST 1V
1 series · 1 of 1 positions shown · non-contrast
Comparison: 03/28/2020

CLINICAL DATA: Pain, no trauma

EXAM:
CHEST  1 VIEW

[chest ap]
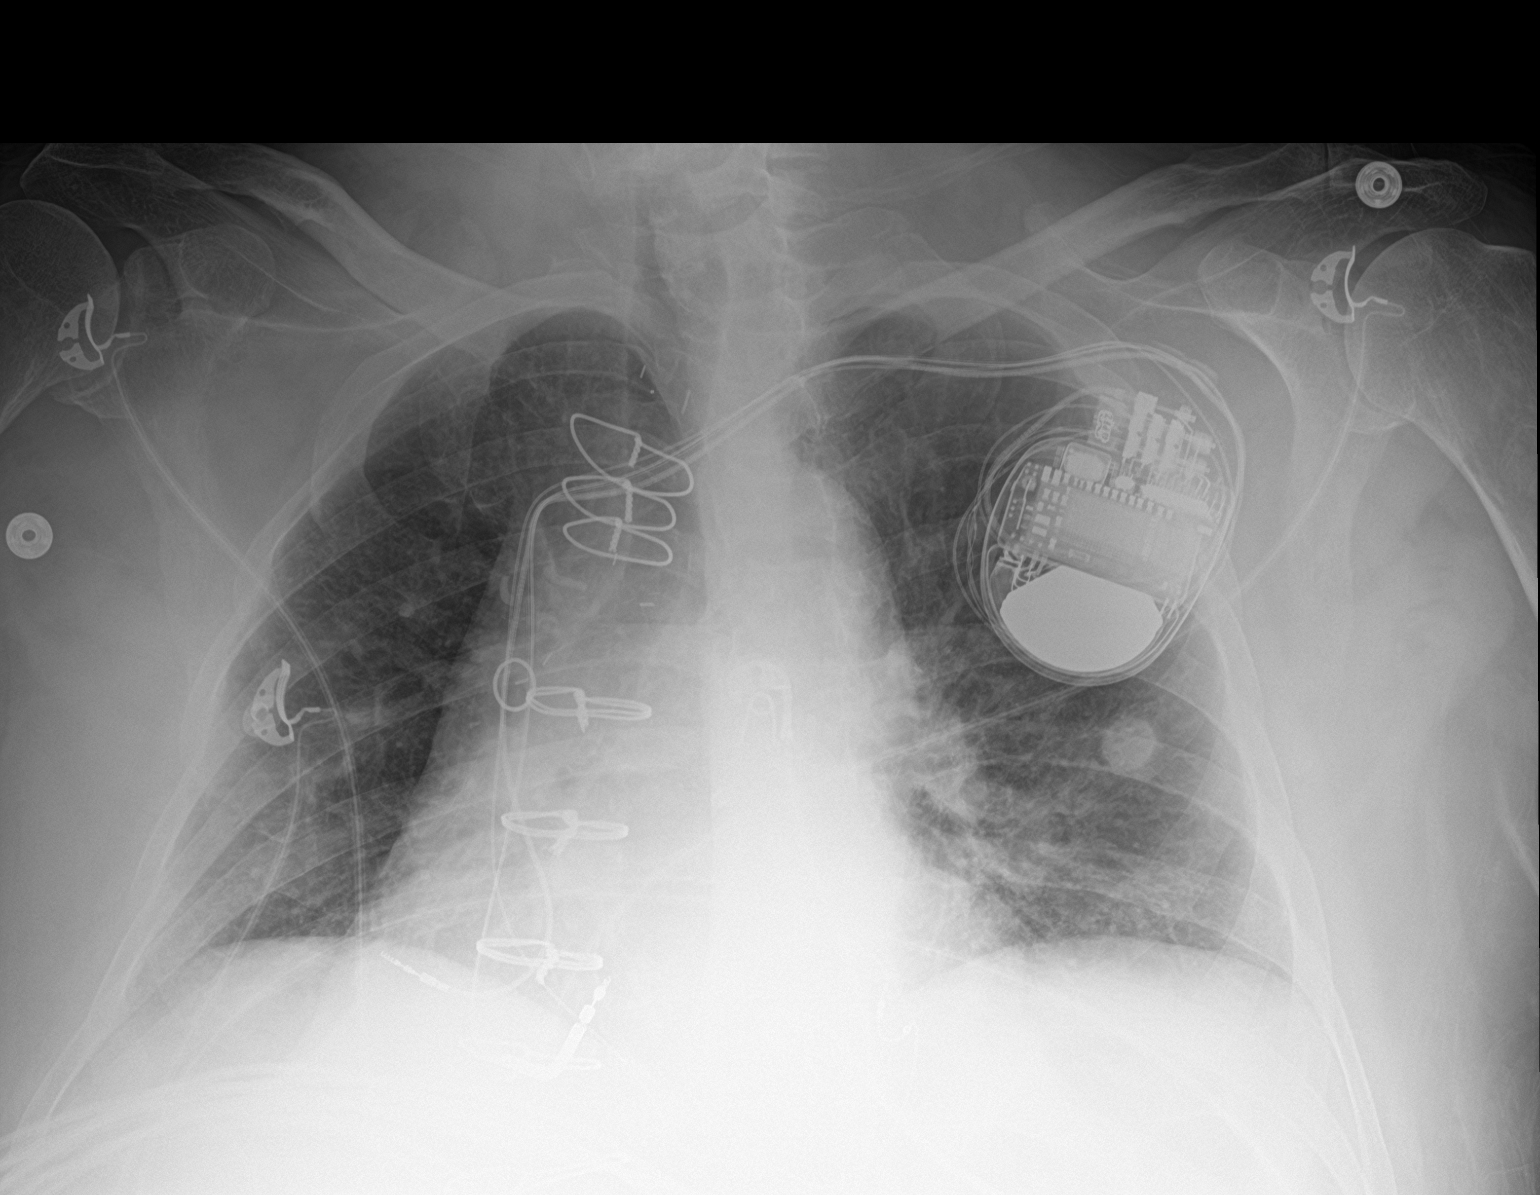

[1 of 1 positions shown; findings below may reference images not displayed]

FINDINGS: Cardiomegaly status post median sternotomy and CABG with left chest
multi lead pacer defibrillator. Both lungs are clear. Large, densely
calcified nodule of the left upper lobe. The visualized skeletal
structures are unremarkable.
IMPRESSION: Cardiomegaly without acute abnormality of the lungs in AP portable
projection.

## 2022-09-13 ENCOUNTER — Emergency Department (HOSPITAL_COMMUNITY): Payer: No Typology Code available for payment source

## 2022-09-13 ENCOUNTER — Encounter (HOSPITAL_COMMUNITY): Payer: Self-pay

## 2022-09-13 ENCOUNTER — Inpatient Hospital Stay (HOSPITAL_COMMUNITY)
Admission: EM | Admit: 2022-09-13 | Discharge: 2022-09-29 | DRG: 037 | Disposition: A | Discharge status: Skilled Nursing Facility | Payer: No Typology Code available for payment source | Attending: Family Medicine | Admitting: Family Medicine

## 2022-09-13 ENCOUNTER — Other Ambulatory Visit: Payer: Self-pay

## 2022-09-13 DIAGNOSIS — I6522 Occlusion and stenosis of left carotid artery: Secondary | ICD-10-CM

## 2022-09-13 DIAGNOSIS — F419 Anxiety disorder, unspecified: Secondary | ICD-10-CM | POA: Diagnosis present

## 2022-09-13 DIAGNOSIS — Z823 Family history of stroke: Secondary | ICD-10-CM

## 2022-09-13 DIAGNOSIS — R29706 NIHSS score 6: Secondary | ICD-10-CM | POA: Diagnosis present

## 2022-09-13 DIAGNOSIS — I63132 Cerebral infarction due to embolism of left carotid artery: Principal | ICD-10-CM | POA: Diagnosis present

## 2022-09-13 DIAGNOSIS — Z87891 Personal history of nicotine dependence: Secondary | ICD-10-CM

## 2022-09-13 DIAGNOSIS — Z79899 Other long term (current) drug therapy: Secondary | ICD-10-CM

## 2022-09-13 DIAGNOSIS — E66811 Obesity, class 1: Secondary | ICD-10-CM | POA: Diagnosis present

## 2022-09-13 DIAGNOSIS — G8929 Other chronic pain: Secondary | ICD-10-CM | POA: Diagnosis present

## 2022-09-13 DIAGNOSIS — M549 Dorsalgia, unspecified: Secondary | ICD-10-CM | POA: Diagnosis present

## 2022-09-13 DIAGNOSIS — I480 Paroxysmal atrial fibrillation: Secondary | ICD-10-CM | POA: Diagnosis present

## 2022-09-13 DIAGNOSIS — N1831 Chronic kidney disease, stage 3a: Secondary | ICD-10-CM | POA: Diagnosis not present

## 2022-09-13 DIAGNOSIS — T148XXA Other injury of unspecified body region, initial encounter: Secondary | ICD-10-CM

## 2022-09-13 DIAGNOSIS — N400 Enlarged prostate without lower urinary tract symptoms: Secondary | ICD-10-CM | POA: Diagnosis present

## 2022-09-13 DIAGNOSIS — Z01810 Encounter for preprocedural cardiovascular examination: Secondary | ICD-10-CM

## 2022-09-13 DIAGNOSIS — F32A Depression, unspecified: Secondary | ICD-10-CM | POA: Diagnosis present

## 2022-09-13 DIAGNOSIS — I639 Cerebral infarction, unspecified: Secondary | ICD-10-CM | POA: Diagnosis not present

## 2022-09-13 DIAGNOSIS — Z6832 Body mass index (BMI) 32.0-32.9, adult: Secondary | ICD-10-CM

## 2022-09-13 DIAGNOSIS — Z905 Acquired absence of kidney: Secondary | ICD-10-CM

## 2022-09-13 DIAGNOSIS — R29703 NIHSS score 3: Secondary | ICD-10-CM | POA: Diagnosis present

## 2022-09-13 DIAGNOSIS — I25708 Atherosclerosis of coronary artery bypass graft(s), unspecified, with other forms of angina pectoris: Secondary | ICD-10-CM | POA: Diagnosis present

## 2022-09-13 DIAGNOSIS — M109 Gout, unspecified: Secondary | ICD-10-CM | POA: Diagnosis present

## 2022-09-13 DIAGNOSIS — R471 Dysarthria and anarthria: Secondary | ICD-10-CM | POA: Diagnosis present

## 2022-09-13 DIAGNOSIS — I25118 Atherosclerotic heart disease of native coronary artery with other forms of angina pectoris: Secondary | ICD-10-CM | POA: Diagnosis present

## 2022-09-13 DIAGNOSIS — I708 Atherosclerosis of other arteries: Secondary | ICD-10-CM | POA: Diagnosis present

## 2022-09-13 DIAGNOSIS — Z7902 Long term (current) use of antithrombotics/antiplatelets: Secondary | ICD-10-CM

## 2022-09-13 DIAGNOSIS — E785 Hyperlipidemia, unspecified: Secondary | ICD-10-CM | POA: Diagnosis present

## 2022-09-13 DIAGNOSIS — Z955 Presence of coronary angioplasty implant and graft: Secondary | ICD-10-CM

## 2022-09-13 DIAGNOSIS — Z85528 Personal history of other malignant neoplasm of kidney: Secondary | ICD-10-CM

## 2022-09-13 DIAGNOSIS — Z888 Allergy status to other drugs, medicaments and biological substances status: Secondary | ICD-10-CM

## 2022-09-13 DIAGNOSIS — G4733 Obstructive sleep apnea (adult) (pediatric): Secondary | ICD-10-CM | POA: Diagnosis present

## 2022-09-13 DIAGNOSIS — I272 Pulmonary hypertension, unspecified: Secondary | ICD-10-CM | POA: Diagnosis present

## 2022-09-13 DIAGNOSIS — G629 Polyneuropathy, unspecified: Secondary | ICD-10-CM | POA: Diagnosis present

## 2022-09-13 DIAGNOSIS — K219 Gastro-esophageal reflux disease without esophagitis: Secondary | ICD-10-CM | POA: Diagnosis present

## 2022-09-13 DIAGNOSIS — N179 Acute kidney failure, unspecified: Secondary | ICD-10-CM | POA: Diagnosis not present

## 2022-09-13 DIAGNOSIS — G8191 Hemiplegia, unspecified affecting right dominant side: Secondary | ICD-10-CM | POA: Diagnosis present

## 2022-09-13 DIAGNOSIS — E876 Hypokalemia: Secondary | ICD-10-CM | POA: Diagnosis present

## 2022-09-13 DIAGNOSIS — E669 Obesity, unspecified: Secondary | ICD-10-CM | POA: Diagnosis present

## 2022-09-13 DIAGNOSIS — Z751 Person awaiting admission to adequate facility elsewhere: Secondary | ICD-10-CM

## 2022-09-13 DIAGNOSIS — Z8673 Personal history of transient ischemic attack (TIA), and cerebral infarction without residual deficits: Secondary | ICD-10-CM

## 2022-09-13 DIAGNOSIS — Z20822 Contact with and (suspected) exposure to covid-19: Secondary | ICD-10-CM | POA: Diagnosis present

## 2022-09-13 DIAGNOSIS — Z95810 Presence of automatic (implantable) cardiac defibrillator: Secondary | ICD-10-CM

## 2022-09-13 DIAGNOSIS — Z9049 Acquired absence of other specified parts of digestive tract: Secondary | ICD-10-CM

## 2022-09-13 DIAGNOSIS — I130 Hypertensive heart and chronic kidney disease with heart failure and stage 1 through stage 4 chronic kidney disease, or unspecified chronic kidney disease: Secondary | ICD-10-CM | POA: Diagnosis present

## 2022-09-13 DIAGNOSIS — I10 Essential (primary) hypertension: Secondary | ICD-10-CM | POA: Diagnosis present

## 2022-09-13 DIAGNOSIS — Z7982 Long term (current) use of aspirin: Secondary | ICD-10-CM

## 2022-09-13 DIAGNOSIS — Z8249 Family history of ischemic heart disease and other diseases of the circulatory system: Secondary | ICD-10-CM

## 2022-09-13 DIAGNOSIS — C4442 Squamous cell carcinoma of skin of scalp and neck: Secondary | ICD-10-CM | POA: Diagnosis present

## 2022-09-13 DIAGNOSIS — I5043 Acute on chronic combined systolic (congestive) and diastolic (congestive) heart failure: Secondary | ICD-10-CM | POA: Diagnosis not present

## 2022-09-13 DIAGNOSIS — Z7901 Long term (current) use of anticoagulants: Secondary | ICD-10-CM

## 2022-09-13 DIAGNOSIS — I252 Old myocardial infarction: Secondary | ICD-10-CM

## 2022-09-13 LAB — I-STAT CHEM 8, ED
BUN: 28 mg/dL — ABNORMAL HIGH (ref 8–23)
Calcium, Ion: 1.06 mmol/L — ABNORMAL LOW (ref 1.15–1.40)
Chloride: 108 mmol/L (ref 98–111)
Creatinine, Ser: 1.6 mg/dL — ABNORMAL HIGH (ref 0.61–1.24)
Glucose, Bld: 121 mg/dL — ABNORMAL HIGH (ref 70–99)
HCT: 34 % — ABNORMAL LOW (ref 39.0–52.0)
Hemoglobin: 11.6 g/dL — ABNORMAL LOW (ref 13.0–17.0)
Potassium: 4.9 mmol/L (ref 3.5–5.1)
Sodium: 142 mmol/L (ref 135–145)
TCO2: 26 mmol/L (ref 22–32)

## 2022-09-13 LAB — CBC
HCT: 36.7 % — ABNORMAL LOW (ref 39.0–52.0)
Hemoglobin: 12.3 g/dL — ABNORMAL LOW (ref 13.0–17.0)
MCH: 30.4 pg (ref 26.0–34.0)
MCHC: 33.5 g/dL (ref 30.0–36.0)
MCV: 90.8 fL (ref 80.0–100.0)
Platelets: 165 10*3/uL (ref 150–400)
RBC: 4.04 MIL/uL — ABNORMAL LOW (ref 4.22–5.81)
RDW: 14.5 % (ref 11.5–15.5)
WBC: 4.3 10*3/uL (ref 4.0–10.5)
nRBC: 0 % (ref 0.0–0.2)

## 2022-09-13 LAB — PROTIME-INR
INR: 1.5 — ABNORMAL HIGH (ref 0.8–1.2)
Prothrombin Time: 18.3 seconds — ABNORMAL HIGH (ref 11.4–15.2)

## 2022-09-13 LAB — DIFFERENTIAL
Abs Immature Granulocytes: 0.04 10*3/uL (ref 0.00–0.07)
Basophils Absolute: 0 10*3/uL (ref 0.0–0.1)
Basophils Relative: 1 %
Eosinophils Absolute: 0.1 10*3/uL (ref 0.0–0.5)
Eosinophils Relative: 2 %
Immature Granulocytes: 1 %
Lymphocytes Relative: 13 %
Lymphs Abs: 0.6 10*3/uL — ABNORMAL LOW (ref 0.7–4.0)
Monocytes Absolute: 0.3 10*3/uL (ref 0.1–1.0)
Monocytes Relative: 7 %
Neutro Abs: 3.3 10*3/uL (ref 1.7–7.7)
Neutrophils Relative %: 76 %

## 2022-09-13 LAB — RESP PANEL BY RT-PCR (FLU A&B, COVID) ARPGX2
Influenza A by PCR: NEGATIVE
Influenza B by PCR: NEGATIVE
SARS Coronavirus 2 by RT PCR: NEGATIVE

## 2022-09-13 LAB — CBG MONITORING, ED: Glucose-Capillary: 122 mg/dL — ABNORMAL HIGH (ref 70–99)

## 2022-09-13 LAB — APTT: aPTT: 35 seconds (ref 24–36)

## 2022-09-13 LAB — ETHANOL: Alcohol, Ethyl (B): <10 mg/dL (ref <10)

## 2022-09-13 MED ORDER — ACETAMINOPHEN 650 MG RE SUPP: 650.0000 mg | RECTAL | Status: DC | PRN

## 2022-09-13 MED ORDER — ACETAMINOPHEN 325 MG PO TABS
650.0000 mg | ORAL_TABLET | ORAL | Status: DC | PRN
  Administered 2022-09-15 – 2022-09-26 (×6): 650 mg via ORAL
  Filled 2022-09-13 (×6): qty 2

## 2022-09-13 MED ORDER — POTASSIUM CHLORIDE CRYS ER 10 MEQ PO TBCR
10.0000 meq | EXTENDED_RELEASE_TABLET | Freq: Every day | ORAL | Status: DC
  Administered 2022-09-13: 10 meq via ORAL
  Filled 2022-09-13: qty 1

## 2022-09-13 MED ORDER — ESCITALOPRAM OXALATE 20 MG PO TABS
20.0000 mg | ORAL_TABLET | Freq: Every day | ORAL | Status: DC
  Administered 2022-09-13 – 2022-09-28 (×16): 20 mg via ORAL
  Filled 2022-09-13: qty 1
  Filled 2022-09-13 (×2): qty 2
  Filled 2022-09-13: qty 1
  Filled 2022-09-13 (×2): qty 2
  Filled 2022-09-13 (×4): qty 1
  Filled 2022-09-13 (×3): qty 2
  Filled 2022-09-13 (×3): qty 1

## 2022-09-13 MED ORDER — ALBUTEROL SULFATE (2.5 MG/3ML) 0.083% IN NEBU: 2.5000 mg | INHALATION_SOLUTION | Freq: Every day | RESPIRATORY_TRACT | Status: DC | PRN

## 2022-09-13 MED ORDER — ATORVASTATIN CALCIUM 80 MG PO TABS
80.0000 mg | ORAL_TABLET | Freq: Every day | ORAL | Status: DC
  Administered 2022-09-14 – 2022-09-29 (×15): 80 mg via ORAL
  Filled 2022-09-13 (×3): qty 1
  Filled 2022-09-13: qty 2
  Filled 2022-09-13 (×12): qty 1

## 2022-09-13 MED ORDER — CLOPIDOGREL BISULFATE 75 MG PO TABS
75.0000 mg | ORAL_TABLET | Freq: Every day | ORAL | Status: DC
  Administered 2022-09-14 – 2022-09-18 (×5): 75 mg via ORAL
  Filled 2022-09-13 (×5): qty 1

## 2022-09-13 MED ORDER — IOHEXOL 350 MG/ML SOLN
80.0000 mL | Freq: Once | INTRAVENOUS | Status: AC | PRN
  Administered 2022-09-13: 80 mL via INTRAVENOUS

## 2022-09-13 MED ORDER — CALCITRIOL 0.25 MCG PO CAPS
0.2500 ug | ORAL_CAPSULE | Freq: Every day | ORAL | Status: DC
  Administered 2022-09-14 – 2022-09-29 (×15): 0.25 ug via ORAL
  Filled 2022-09-13 (×15): qty 1

## 2022-09-13 MED ORDER — PANTOPRAZOLE SODIUM 40 MG PO TBEC: 40.0000 mg | DELAYED_RELEASE_TABLET | Freq: Every day | ORAL | Status: DC

## 2022-09-13 MED ORDER — FEBUXOSTAT 40 MG PO TABS
80.0000 mg | ORAL_TABLET | Freq: Every day | ORAL | Status: DC
  Administered 2022-09-14 – 2022-09-25 (×11): 80 mg via ORAL
  Filled 2022-09-13 (×13): qty 2

## 2022-09-13 MED ORDER — DOCUSATE SODIUM 100 MG PO CAPS
100.0000 mg | ORAL_CAPSULE | Freq: Every day | ORAL | Status: DC
  Administered 2022-09-14 – 2022-09-29 (×8): 100 mg via ORAL
  Filled 2022-09-13 (×13): qty 1

## 2022-09-13 MED ORDER — TRAZODONE HCL 50 MG PO TABS
25.0000 mg | ORAL_TABLET | Freq: Every evening | ORAL | Status: DC | PRN
  Administered 2022-09-20 – 2022-09-28 (×3): 25 mg via ORAL
  Filled 2022-09-13 (×3): qty 1

## 2022-09-13 MED ORDER — PRIMIDONE 50 MG PO TABS
100.0000 mg | ORAL_TABLET | Freq: Two times a day (BID) | ORAL | Status: DC
  Administered 2022-09-13 – 2022-09-29 (×31): 100 mg via ORAL
  Filled 2022-09-13 (×35): qty 2

## 2022-09-13 MED ORDER — COLCHICINE 0.3 MG HALF TABLET
0.3000 mg | ORAL_TABLET | Freq: Two times a day (BID) | ORAL | Status: DC
  Administered 2022-09-14 – 2022-09-25 (×23): 0.3 mg via ORAL
  Filled 2022-09-13: qty 1
  Filled 2022-09-13: qty 0.5
  Filled 2022-09-13 (×5): qty 1
  Filled 2022-09-13 (×3): qty 0.5
  Filled 2022-09-13 (×2): qty 1
  Filled 2022-09-13: qty 0.5
  Filled 2022-09-13 (×3): qty 1
  Filled 2022-09-13: qty 0.5
  Filled 2022-09-13 (×3): qty 1
  Filled 2022-09-13: qty 0.5
  Filled 2022-09-13 (×6): qty 1

## 2022-09-13 MED ORDER — ACETAMINOPHEN 160 MG/5ML PO SOLN: 650.0000 mg | ORAL | Status: DC | PRN

## 2022-09-13 MED ORDER — ALBUTEROL SULFATE HFA 108 (90 BASE) MCG/ACT IN AERS: 2.0000 | INHALATION_SPRAY | Freq: Every day | RESPIRATORY_TRACT | Status: DC | PRN

## 2022-09-13 MED ORDER — STROKE: EARLY STAGES OF RECOVERY BOOK
Freq: Once | Status: AC
  Filled 2022-09-13: qty 1

## 2022-09-13 NOTE — Code Documentation (Signed)
[place] Monds is a 76 yr old man with PMH of pacermaker, AF on Eliquis, CHF who presents to [facility] for evaluation of rt sided weakness and sensory loss. Pt was in usual state of health today at 1500 when he had a sudden difficulty using his fork to eat.   Stroke team at bedside on pt arrival. Labs drawn, CBG obtained and airway cleared by EDP. Pt to CT with team. NIHSS 6 For rt arm drift, rt sensory loss, and bilateral leg weakness. Please see documentation for code stroke times and details.  The following imaging was obtained: [place], CTA. CT negative for hemorrhage per Dr Lorrin Goodell. CTA negative for ELVO. Pt is not a candidate for thrombolysis due to Eliquis. He is not a candidate for mechanical thrombectomy due to LVO negative. He was returned to ED room 4 where his workup will continue. He will need q 2 hr VS and NIHSS. Bedside handoff with Larene Beach RN complete.

## 2022-09-13 NOTE — ED Notes (Signed)
Pt has biopsy done behind left ear and top of head. Pt biopsy site should be cleaned twice a day. Clean with soap and water, apply Vaseline, and cover with non-stick bandage. Do not let the site scab over. 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 Information provided by pt home health aide

## 2022-09-13 NOTE — ED Provider Notes (Signed)
[facility] EMERGENCY DEPARTMENT Provider Note   CSN: [id number] Arrival date & time: 09/13/22  1745  An emergency department physician performed an initial assessment on this suspected stroke patient at 1750.  History  Chief Complaint  Patient presents with   Code Stroke    Alexander Austin is a 76 y.o. male.  Patient with history of chronic back pain, CAD s/p CABG, A-fib on Eliquis, HTN, CHF, CKD and pacemaker presents right sided weakness. He was with his aide at home and walking normally at 3pm. He noticed some difficulty with eating his dinner and ambulating to the bathroom around 3:30pm. He reported he did take Elqiuis at 4pm. 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      Home Medications Prior to Admission medications   Medication Sig Start Date End Date Taking? Authorizing Provider  amLODipine (NORVASC) 10 MG tablet Take 10 mg by mouth daily.    [provider]  apixaban (ELIQUIS) 5 MG TABS tablet Take 5 mg by mouth 2 (two) times daily.     [provider]  aspirin EC 81 MG tablet Take 81 mg by mouth daily. Patient not taking: Reported on 07/03/2020    [provider]  atorvastatin (LIPITOR) 80 MG tablet Take 80 mg by mouth daily.     [provider]  calcitRIOL (ROCALTROL) 0.25 MCG capsule Take 0.25 mcg by mouth daily.     [provider]  carvedilol (COREG) 25 MG tablet Take 25 mg by mouth in the morning and at bedtime.  05/05/15   [provider]  colchicine 0.6 MG tablet Take 0.5 tablets (0.3 mg total) by mouth 2 (two) times daily. 07/07/20   Danford, Suann Larry, MD  diclofenac Sodium (VOLTAREN) 1 % GEL Apply 2 g topically 4 (four) times daily. 05/26/20   Geradine Girt, DO  ferrous sulfate 325 (65 FE) MG tablet Take 325 mg by mouth every Monday, Wednesday, and Friday.  10/18/18   [provider]  furosemide (LASIX) 40 MG tablet Take 1 tablet (40 mg total) by mouth  daily. Patient taking differently: Take 40 mg by mouth 2 (two) times daily.  04/05/20   Florencia Reasons, MD  hydrALAZINE (APRESOLINE) 50 MG tablet Take 1 tablet (50 mg total) by mouth 3 (three) times daily. 04/04/20   Florencia Reasons, MD  ketotifen (ZADITOR) 0.025 % ophthalmic solution Place 1 drop into both eyes 2 (two) times daily.  Patient not taking: Reported on 07/03/2020    [provider]  pantoprazole (PROTONIX) 40 MG tablet Take 40 mg by mouth daily.     [provider]  predniSONE (DELTASONE) 10 MG tablet Take prednisone 30 mg (3 tabs) on Tuesday, take prednisone 20 mg (2 tabs) on Weds, and then take 10 mg (1 tab) on Thu and stop 07/07/20   Danford, Suann Larry, MD  primidone (MYSOLINE) 50 MG tablet Take 100 mg by mouth in the morning and at bedtime.     [provider]  sertraline (ZOLOFT) 50 MG tablet Take 25 mg by mouth at bedtime.     [provider]  Vitamin D, Ergocalciferol, (DRISDOL) 1.25 MG (50000 UNIT) CAPS capsule Take 50,000 Units by mouth every 7 (seven) days. Sunday    [provider]      Allergies    Isosorbide nitrate and Shellfish-derived products    Review of Systems   Review of Systems  Neurological:  Positive for weakness and  numbness. Negative for syncope, facial asymmetry and light-headedness.    Physical Exam Updated Vital Signs BP (!) 160/64   Pulse 61   Resp (!) 25   Ht '6\' 1"'$  (1.854 m)   Wt 112.4 kg   SpO2 93%   BMI 32.69 kg/m  Physical Exam Constitutional:      General: He is not in acute distress. HENT:     Head: Normocephalic.  Cardiovascular:     Rate and Rhythm: Normal rate and regular rhythm.  Pulmonary:     Effort: Pulmonary effort is normal.  Skin:    General: Skin is warm and dry.  Neurological:     Mental Status: He is alert.     Cranial Nerves: Cranial nerves 2-12 are intact.     Sensory: Sensory deficit (right UE and LE) present.     Motor: Weakness (right UE and LE) and pronator drift present.      Coordination: Finger-Nose-Finger Test abnormal.     ED Results / Procedures / Treatments   Labs (all labs ordered are listed, but only abnormal results are displayed) Labs Reviewed  PROTIME-INR - Abnormal; Notable for the following components:      Result Value   Prothrombin Time 18.3 (*)    INR 1.5 (*)    All other components within normal limits  CBC - Abnormal; Notable for the following components:   RBC 4.04 (*)    Hemoglobin 12.3 (*)    HCT 36.7 (*)    All other components within normal limits  DIFFERENTIAL - Abnormal; Notable for the following components:   Lymphs Abs 0.6 (*)    All other components within normal limits  I-STAT CHEM 8, ED - Abnormal; Notable for the following components:   BUN 28 (*)    Creatinine, Ser 1.60 (*)    Glucose, Bld 121 (*)    Calcium, Ion 1.06 (*)    Hemoglobin 11.6 (*)    HCT 34.0 (*)    All other components within normal limits  CBG MONITORING, ED - Abnormal; Notable for the following components:   Glucose-Capillary 122 (*)    All other components within normal limits  RESP PANEL BY RT-PCR (FLU A&B, COVID) ARPGX2  ETHANOL  APTT  RAPID URINE DRUG SCREEN, HOSP PERFORMED  URINALYSIS, ROUTINE W REFLEX MICROSCOPIC  COMPREHENSIVE METABOLIC PANEL  LIPID PANEL  HEMOGLOBIN A1C    EKG None  Radiology CT ANGIO HEAD NECK W WO CM (CODE STROKE)  Result Date: 09/13/2022 CLINICAL DATA:  By history: Neuro deficit, acute, stroke suspected. Right-sided weakness. EXAM: CT ANGIOGRAPHY HEAD AND NECK TECHNIQUE: Multidetector CT imaging of the head and neck was performed using the standard protocol during bolus administration of intravenous contrast. Multiplanar CT image reconstructions and MIPs were obtained to evaluate the vascular anatomy. Carotid stenosis measurements (when applicable) are obtained utilizing NASCET criteria, using the distal internal carotid diameter as the denominator. RADIATION DOSE REDUCTION: This exam was performed according  to the departmental dose-optimization program which includes automated exposure control, adjustment of the mA and/or kV according to patient size and/or use of iterative reconstruction technique. CONTRAST:  81m OMNIPAQUE IOHEXOL 350 MG/ML SOLN COMPARISON:  Noncontrast head CT performed earlier today 09/13/2022. FINDINGS: CTA NECK FINDINGS Aortic arch: Standard aortic branching. Atherosclerotic plaque within the visualized aortic arch and proximal major branch vessels of the neck. Streak and beam hardening artifact arising from a dense right-sided contrast bolus partially obscures the right subclavian artery. However, there is an apparent severe stenosis within  the mid right subclavian artery (series 8, image 205). Right carotid system: CCA and ICA patent within the neck. Atherosclerotic plaque within the distal CCA, about the carotid bifurcation and within the proximal ICA. Atherosclerotic narrowing of the proximal ICA of up to 60%. Foci ulcerated plaque within the carotid bulb. Left carotid system: CCA and ICA patent within the neck. Atherosclerotic plaque at the CCA origin, within the distal CCA, about the carotid bifurcation and within the proximal to mid ICA. Stenosis is greatest at the origin of the left ICA (severe and likely greater than 80% at this site) (series 7, image 236). Vertebral arteries: Vertebral arteries patent within the neck. Atherosclerotic disease within these vessels. Most notably, there is multifocal moderate severe stenoses within the at the right vertebral artery origin and within the right V1 segment. Additionally, there is a moderate/severe stenosis at the origin of the left vertebral artery. Skeleton: Multilevel vertebral ankylosis within the cervical and upper thoracic spine. Cervical spondylosis. At C3-C4, a posterior disc osteophyte complex and ossification of the posterior longitudinal ligament contribute to apparent severe spinal canal stenosis. No acute fracture or aggressive  osseous lesion. Other neck: Subcentimeter thyroid nodules, not meeting consensus criteria for ultrasound follow-up based on size. No follow-up imaging is recommended. Reference: J Am Coll Radiol. 2015 Feb;12(2): 143-50. Upper chest: No consolidation within the imaged lung apices. Review of the MIP images confirms the above findings CTA HEAD FINDINGS Anterior circulation: The intracranial internal carotid arteries are patent. Atherosclerotic plaque within both vessels. No more than mild stenosis of the intracranial right ICA. Up to moderate stenosis of the paraclinoid left ICA. The M1 middle cerebral arteries are patent. Atherosclerotic irregularity of the M2 and more distal MCA vessels, bilaterally. Most notably, there is a severe stenosis within a mid M1 right MCA vessel (series 12, image 40). No M2 proximal branch occlusion is identified. The anterior cerebral arteries are patent. No intracranial aneurysm is identified. Posterior circulation: The intracranial vertebral arteries are patent. Sites of severe stenosis within the proximal V4 segments, bilaterally. The basilar artery is patent. Fenestration within the proximal basilar artery. The posterior cerebral arteries are patent. Posterior communicating arteries are hypoplastic or absent, bilaterally. Venous sinuses: Within the limitations of contrast timing, no convincing thrombus. Anatomic variants: As described. Review of the MIP images confirms the above findings No emergent large vessel occlusion identified. These results were communicated to Dr. Lorrin Goodell At 6:28 pmon 9/25/2023by text page via the [facility] messaging system. IMPRESSION: CTA neck: 1. The common carotid and internal carotid arteries are patent within the neck. Atherosclerotic plaque bilaterally. Most notably, there is a severe stenosis at the origin of the left ICA (likely greater than 80%). 2. Vertebral arteries patent within the neck. Sites of moderate/severe stenosis at the right vertebral  artery origin, within the right V1 segment and at the origin of the left vertebral artery. 3. 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 4. Aortic Atherosclerosis (ICD10-I70.0). 5. 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 Consider a dedicated cervical spine MRI for further evaluation. CTA head: 1. No intracranial large vessel occlusion is identified. 2. Intracranial atherosclerotic disease with multifocal stenoses, most notably as follows. 3. Up to moderate stenosis within the paraclinoid left ICA. 4. Severe focal stenosis within a mid M2 right MCA vessel. 5. Sites of severe stenosis within the proximal V4 vertebral arteries, bilaterally. Electronically Signed   By: Kellie Simmering D.O.   On: 09/13/2022 18:31   CT HEAD CODE STROKE WO CONTRAST  Result  Date: 09/13/2022 CLINICAL DATA:  Code stroke. Neuro deficit, acute, stroke suspected. Right-sided weakness. EXAM: CT HEAD WITHOUT CONTRAST TECHNIQUE: Contiguous axial images were obtained from the base of the skull through the vertex without intravenous contrast. RADIATION DOSE REDUCTION: This exam was performed according to the departmental dose-optimization program which includes automated exposure control, adjustment of the mA and/or kV according to patient size and/or use of iterative reconstruction technique. COMPARISON:  Head CT 09/07/2014 (images available, report unavailable). FINDINGS: Brain: Moderate cerebral atrophy. Small cortical/subcortical infarct within the mid to posterior left frontal lobe (within the left MCA/ACA watershed territory), new from the prior head CT of 09/07/2014, but otherwise age-indeterminate. Small chronic cortical/subcortical infarcts scattered elsewhere within the bilateral cerebral hemispheres. Background mild patchy and ill-defined hypoattenuation within the cerebral white matter, nonspecific but compatible with chronic small vessel disease. Chronic lacunar infarcts within bilateral basal ganglia. Small  chronic infarcts within the bilateral cerebellar hemispheres. Vascular: No hyperdense vessel.  Atherosclerotic calcifications. Skull: No fracture or aggressive osseous lesion. Sinuses/Orbits: No mass or acute finding within the imaged orbits. Small mucous retention cysts within the right maxillary sinus. ASPECTS [facility] Stroke Program Early CT Score) - Ganglionic level infarction (caudate, lentiform nuclei, internal capsule, insula, M1-M3 cortex): 7 - Supraganglionic infarction (M4-M6 cortex): 2 Total score (0-10 with 10 being normal): 9 These results were communicated to Dr. Lorrin Goodell At 6:10 pmon 9/25/2023by text page via the [facility] messaging system. IMPRESSION: Small cortical/subcortical infarct within the mid-to-posterior left frontal lobe (left MCA/ACA watershed territory), new from the prior head CT of 09/07/2014, but otherwise age-indeterminate. Background parenchymal atrophy, chronic small-vessel ischemic disease and chronic infarcts, as described. Electronically Signed   By: Kellie Simmering D.O.   On: 09/13/2022 18:10    Procedures Procedures   Medications Ordered in ED Medications   stroke: early stages of recovery book (has no administration in time range)  acetaminophen (TYLENOL) tablet 650 mg (has no administration in time range)    Or  acetaminophen (TYLENOL) 160 MG/5ML solution 650 mg (has no administration in time range)    Or  acetaminophen (TYLENOL) suppository 650 mg (has no administration in time range)  iohexol (OMNIPAQUE) 350 MG/ML injection 80 mL (80 mLs Intravenous Contrast Given 09/13/22 1802)    ED Course/ Medical Decision Making/ A&P                           Medical Decision Making Patient presents with sudden onset right UE and LE weakness. Prior history of CVA. He is hypertensive on arrival. Labs show creatinine at baseline. Neurology consulted for Code Stroke. CT head concern for cortical infarct of left frontal lobe. CTA showed severe stenosis of left ICA and mid  right subclavian artery, moderate/severe stensosis at right vertebral artery. Will need hospital admission for stroke workup.   Amount and/or Complexity of Data Reviewed Labs: ordered. Radiology: ordered.  Risk Decision regarding hospitalization.    Final Clinical Impression(s) / ED Diagnoses Final diagnoses:  Acute CVA (cerebrovascular accident) [facility])         Angelique Blonder, DO 09/13/22 1930    Carmin Muskrat, MD 09/13/22 2241

## 2022-09-13 NOTE — H&P (Signed)
History and Physical    Patient: Alexander Austin SWF:[id number] DOB: 11-20-1946 DOA: 09/13/2022 DOS: the patient was seen and examined on 09/13/2022 PCP: Windy Fast, MD  Patient coming from: Home  Chief Complaint:  Chief Complaint  Patient presents with   Code Stroke   HPI: Alexander Austin is a 76 y.o. male with medical history significant of chronic systolic heart failure, CAD s/p CABG x3 with ICD and pacemaker, paroxysmal atrial fibrillation on Eliquis, CVA, pulmonary hypertension, CKD 4 with history of renal cell cancer s/p nephrectomy who presents with concerns of right-sided weakness.  Home aid helps with hx at bedside.  Around 3 PM today he was sitting down eating when he suddenly had weakness holding a fork with his right hand.  He then got up to use the bathroom and had worsening unsteady gait.  At baseline, he ambulates with a rollator due to peripheral neuropathy.  Denies any headache, vision changes.  No chest pain or palpitations.  He takes Plavix and Eliquis.  Took both of doses already today. No history of tobacco use.  In the ED he was mildly hypertensive with BP of 160/64 on room air.  No leukocytosis.  BMP is unremarkable.  He presented as a code stroke and had CT head showing small cortical/subcortical infarct in the left frontal lobe that was new from 2015 but otherwise age-indeterminate.  CTA head and neck with no large vessel occlusion but had severe stenosis of the origin of the left ICA greater than 80%.  Patient was not a candidate for TNKase since he is on Eliquis.  No mechanical thrombectomy due to no p.o.  Neurology recommended full stroke work-up and hospitalist consulted to admit.  Review of Systems: As mentioned in the history of present illness. All other systems reviewed and are negative. Past Medical History:  Diagnosis Date   A-fib [facility])    Anxiety    Benign prostate hyperplasia    Cancer ([place])    Kidney   Chronic kidney disease    Congestive heart  failure (CHF) (HCC)    CVA (cerebral infarction)    Depression    Dyspnea    GERD (gastroesophageal reflux disease)    Gout    Hematospermia 01/13/2012   History of myocardial infarction 1993   Hyperlipidemia    Hypertension    Sleep apnea    Stroke ([place])    Vitamin B 12 deficiency    Weakness of left leg 03/28/2020   Past Surgical History:  Procedure Laterality Date   ANGIOPLASTY     X 8    BACK SURGERY     CHOLECYSTECTOMY     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   SAPHENOUS VEIN GRAFT RESECTION  02/05/15   Social History:  reports that he quit smoking about 40 years ago. He has never used smokeless tobacco. He reports that he does not drink alcohol and does not use drugs.  Allergies  Allergen Reactions   Isosorbide Nitrate Anaphylaxis    Other reaction(s): Cardiovascular Arrest (ALLERGY/intolerance) Can take Sublingual Nitro.    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    Family History  Problem Relation Age of Onset   Hypertension Mother    Heart disease Mother    Stroke Mother    Hypertension Father     Prior to Admission medications   Medication Sig Start Date End Date Taking? Authorizing Provider  amLODipine (NORVASC) 10 MG tablet Take 10 mg  by mouth daily.    [provider]  apixaban (ELIQUIS) 5 MG TABS tablet Take 5 mg by mouth 2 (two) times daily.     [provider]  aspirin EC 81 MG tablet Take 81 mg by mouth daily. Patient not taking: Reported on 07/03/2020    [provider]  atorvastatin (LIPITOR) 80 MG tablet Take 80 mg by mouth daily.     [provider]  calcitRIOL (ROCALTROL) 0.25 MCG capsule Take 0.25 mcg by mouth daily.     [provider]  carvedilol (COREG) 25 MG tablet Take 25 mg by mouth in the morning and at bedtime.  05/05/15   [provider]  colchicine 0.6 MG tablet Take 0.5 tablets (0.3 mg total) by mouth 2 (two) times daily.  07/07/20   Danford, Suann Larry, MD  diclofenac Sodium (VOLTAREN) 1 % GEL Apply 2 g topically 4 (four) times daily. 05/26/20   Geradine Girt, DO  ferrous sulfate 325 (65 FE) MG tablet Take 325 mg by mouth every Monday, Wednesday, and Friday.  10/18/18   [provider]  furosemide (LASIX) 40 MG tablet Take 1 tablet (40 mg total) by mouth daily. Patient taking differently: Take 40 mg by mouth 2 (two) times daily.  04/05/20   Florencia Reasons, MD  hydrALAZINE (APRESOLINE) 50 MG tablet Take 1 tablet (50 mg total) by mouth 3 (three) times daily. 04/04/20   Florencia Reasons, MD  ketotifen (ZADITOR) 0.025 % ophthalmic solution Place 1 drop into both eyes 2 (two) times daily.  Patient not taking: Reported on 07/03/2020    [provider]  pantoprazole (PROTONIX) 40 MG tablet Take 40 mg by mouth daily.     [provider]  predniSONE (DELTASONE) 10 MG tablet Take prednisone 30 mg (3 tabs) on Tuesday, take prednisone 20 mg (2 tabs) on Weds, and then take 10 mg (1 tab) on Thu and stop 07/07/20   Danford, Suann Larry, MD  primidone (MYSOLINE) 50 MG tablet Take 100 mg by mouth in the morning and at bedtime.     [provider]  sertraline (ZOLOFT) 50 MG tablet Take 25 mg by mouth at bedtime.     [provider]  Vitamin D, Ergocalciferol, (DRISDOL) 1.25 MG (50000 UNIT) CAPS capsule Take 50,000 Units by mouth every 7 (seven) days. Sunday    [provider]    Physical Exam: Vitals:   09/13/22 1820 09/13/22 1830 09/13/22 1845 09/13/22 1900  BP:   (!) 172/70 (!) 160/64  Pulse:  62 61 61  Resp:  (!) 26 (!) 21 (!) 25  SpO2:  95% 96% 93%  Weight: 112.4 kg     Height: '6\' 1"'$  (1.854 m)      Constitutional: NAD, calm, comfortable, elderly male laying at approximately 30 degree incline in bed Eyes: lids and conjunctivae normal ENMT: Mucous membranes are moist.  Neck: normal, supple Respiratory: clear to auscultation bilaterally, no wheezing, no crackles. Normal respiratory  effort. No accessory muscle use.  Cardiovascular: Regular rate and rhythm, no murmurs / rubs / gallops. No extremity edema. .  Abdomen: Soft, nontender nondistended.  Bowel sounds positive.  Musculoskeletal: no clubbing / cyanosis. No joint deformity upper and lower extremities.  Skin: Healing circular previously biopsied lesion to the left lateral cervical region and left posterior scalp with clean dressing. Neurologic: CN 2-12 grossly intact.  No facial asymmetry.  Equal shoulder shrug.  No nystagmus.  Right upper and lower extremity weakness of 3 out  of 5.  Unable to perform heel-to-shin with right heel. Psychiatric: Normal judgment and insight. Alert and oriented x 3. Normal mood. Data Reviewed:  See HPI  Assessment and Plan: * CVA (cerebral vascular accident) ([place]) -CT head Small cortical/subcortical infarct within the mid-to-posterior left frontal lobe (left MCA/ACA watershed territory), new from the prior head CT of 09/07/2014, but otherwise age-indeterminate -CTA head and neck with severe stenosis of the origin of the left ICA greater than 80% --Obtain MRI brain pending compatibility of his [employer] ICD/pacemaker -Obtain echocardiogram  -Holding Eliquis for now as he took his a.m. and p.m. dose.  Resumption based on size of stroke on MRI brain per neuro. -Obtain A1c and lipids -PT/OT/SLT -Frequent neuro checks and keep on telemetry -Allow for permissive hypertension with blood pressure treatment as needed only if systolic goes above 024  Skin wound from surgical incision Pt has healing biopsy wound at left lateral cervical and left posterior scalp to r/o potential malignancy 2 weeks ago -needs twice daily wound care per RN  Internal carotid artery stenosis, left Severe stenosis near the origin of the left ICA likely greater than 80% -Patient likely poor candidate for intervention given history of CABG, CHF.  However will need to consult vascular surgeon in the  morning.  Obesity (BMI 30.0-34.9) BMI 32  Chronic kidney disease, stage 3a (HCC) Creatinine stable  Essential hypertension, benign Holding home antihypertensives for permissive hypertension  PAF (paroxysmal atrial fibrillation) (HCC) Holding Eliquis as he already took both doses today.  Resumption pending MRI per neurology.  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 (HCC) Continue Plavix.  Already took dose today.      Advance Care Planning:   Code Status: Partial Code no ventilation  Consults: neurology  Family Communication: Home aid at bedside  Severity of Illness: The appropriate patient status for this patient is OBSERVATION. Observation status is judged to be reasonable and necessary in order to provide the required intensity of service to ensure the patient's safety. The patient's presenting symptoms, physical exam findings, and initial radiographic and laboratory data in the context of their medical condition is felt to place them at decreased risk for further clinical deterioration. Furthermore, it is anticipated that the patient will be medically stable for discharge from the hospital within 2 midnights of admission.   Author: Orene Desanctis, DO 09/13/2022 8:37 PM  For on call review [email or web address].

## 2022-09-13 NOTE — Assessment & Plan Note (Signed)
Holding home antihypertensives for permissive hypertension

## 2022-09-13 NOTE — Assessment & Plan Note (Signed)
Holding Eliquis as he already took both doses today.  Resumption pending MRI per neurology.

## 2022-09-13 NOTE — Assessment & Plan Note (Addendum)
-  CT head Small cortical/subcortical infarct within the mid-to-posterior left frontal lobe (left MCA/ACA watershed territory), new from the prior head CT of 09/07/2014, but otherwise age-indeterminate -CTA head and neck with severe stenosis of the origin of the left ICA greater than 80% --Obtain MRI brain pending compatibility of his [employer] ICD/pacemaker -Obtain echocardiogram  -Holding Eliquis for now as he took his a.m. and p.m. dose.  Resumption based on size of stroke on MRI brain per neuro. -Obtain A1c and lipids -PT/OT/SLT -Frequent neuro checks and keep on telemetry -Allow for permissive hypertension with blood pressure treatment as needed only if systolic goes above 355

## 2022-09-13 NOTE — Assessment & Plan Note (Signed)
Continue Plavix.  Already took dose today.

## 2022-09-13 NOTE — Consult Note (Signed)
NEUROLOGY CONSULTATION NOTE   Date of service: September 13, 2022 Patient Name: Alexander Austin MRN:  [id number] DOB:  1946-10-27 Reason for consult: "R sided weakness" Requesting Provider: Carmin Muskrat, MD _ _ _   _ __   _ __ _ _  __ __   _ __   __ _  History of Present Illness  Alexander Austin is a 76 y.o. male with PMH significant for pafibb on eliquis, GERD, HLD, HTN, OSA, CHF who presents with sudden onset RUE and RLE weakness and numbness. He was sitting on the couch and tried to use fork and noted he was weak on his RUE. He attempted to walk but was off balance and weak in RLE. Family called EMS and he was activated as a code stroke. He took an extra dose of eliquis around 1600 after symptom onset in addition to his AM dose of eliquis.  On arrival, he has weak grip strength in RUE along with a drift in RLE.  LKW: 1500 on 09/13/22. mRS: 0 tNKASE: not offered as he is on eliquis and took his dose today Thrombectomy: not offered, no obvious LVO. NIHSS components Score: Comment  1a Level of Conscious 0'[x]'$  1'[]'$  2'[]'$  3'[]'$      1b LOC Questions 0'[x]'$  1'[]'$  2'[]'$       1c LOC Commands 0'[x]'$  1'[]'$  2'[]'$       2 Best Gaze 0'[x]'$  1'[]'$  2'[]'$       3 Visual 0'[x]'$  1'[]'$  2'[]'$  3'[]'$      4 Facial Palsy 0'[x]'$  1'[]'$  2'[]'$  3'[]'$      5a Motor Arm - left 0'[x]'$  1'[]'$  2'[]'$  3'[]'$  4'[]'$  UN'[]'$    5b Motor Arm - Right 0'[x]'$  1'[]'$  2'[]'$  3'[]'$  4'[]'$  UN'[]'$    6a Motor Leg - Left 0'[x]'$  1'[]'$  2'[]'$  3'[]'$  4'[]'$  UN'[]'$    6b Motor Leg - Right 0'[]'$  1'[x]'$  2'[]'$  3'[]'$  4'[]'$  UN'[]'$    7 Limb Ataxia 0'[]'$  1'[x]'$  2'[]'$  3'[]'$  UN'[]'$     8 Sensory 0'[]'$  1'[x]'$  2'[]'$  UN'[]'$      9 Best Language 0'[x]'$  1'[]'$  2'[]'$  3'[]'$      10 Dysarthria 0'[x]'$  1'[]'$  2'[]'$  UN'[]'$      11 Extinct. and Inattention 0'[x]'$  1'[]'$  2'[]'$       TOTAL: 3     ROS   Constitutional Denies weight loss, fever and chills.   HEENT Denies changes in vision and hearing.   Respiratory Denies SOB and cough.   CV Denies palpitations and CP   GI Denies abdominal pain, nausea, vomiting and diarrhea.   GU Denies dysuria and urinary frequency.   MSK Denies myalgia and joint  pain.   Skin Denies rash and pruritus.   Neurological Denies headache and syncope.   Psychiatric Denies recent changes in mood. Denies anxiety and depression.    Past History   Past Medical History:  Diagnosis Date   A-fib [facility])    Anxiety    Benign prostate hyperplasia    Cancer ([place])    Kidney   Chronic kidney disease    Congestive heart failure (CHF) (HCC)    CVA (cerebral infarction)    Depression    Dyspnea    GERD (gastroesophageal reflux disease)    Gout    Hematospermia 01/13/2012   History of myocardial infarction 1993   Hyperlipidemia    Hypertension    Sleep apnea    Stroke [facility])    Vitamin B 12 deficiency    Weakness of left leg 03/28/2020   Past Surgical History:  Procedure Laterality Date   ANGIOPLASTY  X 8    BACK SURGERY     CHOLECYSTECTOMY     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   SAPHENOUS VEIN GRAFT RESECTION  02/05/15   Family History  Problem Relation Age of Onset   Hypertension Mother    Heart disease Mother    Stroke Mother    Hypertension Father    Social History   Socioeconomic History   Marital status: Divorced    Spouse name: Not on file   Number of children: Not on file   Years of education: Not on file   Highest education level: Not on file  Occupational History   Occupation: retired  Tobacco Use   Smoking status: Former    Types: Cigarettes    Quit date: 07/24/1982    Years since quitting: 40.1   Smokeless tobacco: Never  Vaping Use   Vaping Use: Never used  Substance and Sexual Activity   Alcohol use: No    Alcohol/week: 0.0 standard drinks of alcohol   Drug use: No   Sexual activity: Not Currently  Other Topics Concern   Not on file  Social History Narrative   Not on file   Social Determinants of Health   Financial Resource Strain: Not on file  Food Insecurity: Not on file  Transportation Needs: Not on file  Physical Activity: Not on file  Stress: Not on file  Social Connections: Not on file    Allergies  Allergen Reactions   Isosorbide Nitrate Anaphylaxis    Other reaction(s): Cardiovascular Arrest (ALLERGY/intolerance) Can take Sublingual Nitro.    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    Medications  (Not in a hospital admission)    Vitals   There were no vitals filed for this visit.   There is no height or weight on file to calculate BMI.  Physical Exam   General: Laying comfortably in bed; in no acute distress.  HENT: Normal oropharynx and mucosa. Normal external appearance of ears and nose.  Neck: Supple, no pain or tenderness  CV: No JVD. No peripheral edema.  Pulmonary: Symmetric Chest rise. Normal respiratory effort.  Abdomen: Soft to touch, non-tender.  Ext: No cyanosis, edema, or deformity  Skin: No rash. Normal palpation of skin.   Musculoskeletal: Normal digits and nails by inspection. No clubbing.   Neurologic Examination  Mental status/Cognition: Alert, oriented to self, place, month and year, good attention.  Speech/language: Fluent, comprehension intact, object naming intact, repetition intact.  Cranial nerves:   CN II Pupils equal and reactive to light, no VF deficits    CN III,IV,VI EOM intact, no gaze preference or deviation, no nystagmus    CN V normal sensation in V1, V2, and V3 segments bilaterally    CN VII no asymmetry, no nasolabial fold flattening    CN VIII normal hearing to speech    CN IX & X normal palatal elevation, no uvular deviation    CN XI 5/5 head turn and 5/5 shoulder shrug bilaterally    CN XII midline tongue protrusion    Motor:  Muscle bulk: normal, tone normal, pronator drift: has mild RUE pronator drift. tremor none Mvmt Root Nerve  Muscle Right Left Comments  SA C5/6 Ax Deltoid     EF C5/6 Mc Biceps 4+ 5   EE C6/7/8 Rad Triceps 4+ 5   WF C6/7 Med FCR     WE C7/8 PIN ECU     F Ab C8/T1 U  ADM/FDI 4 5   HF L1/2/3 Fem Illopsoas 4- 5   KE L2/3/4 Fem Quad  4 5   DF L4/5 D Peron Tib Ant 3 5   PF S1/2 Tibial Grc/Sol 3 5    Reflexes:  Right Left Comments  Pectoralis      Biceps (C5/6) 2 2   Brachioradialis (C5/6) 2 2    Triceps (C6/7) 2 2    Patellar (L3/4) 2 2    Achilles (S1)      Hoffman      Plantar     Jaw jerk    Sensation:  Light touch Mildly decreased in RUE and RLE   Pin prick    Temperature    Vibration   Proprioception    Coordination/Complex Motor:  - Finger to Nose with past pointing in RUE - Heel to shin unable to do with RLE given pain and weakness. - Rapid alternating movement are slowed. - Gait: Deferred.  Labs   CBC:  Recent Labs  Lab 09/13/22 1753  HGB 11.6*  HCT 34.0*    Basic Metabolic Panel:  Lab Results  Component Value Date   NA 142 09/13/2022   K 4.9 09/13/2022   CO2 23 07/06/2020   GLUCOSE 121 (H) 09/13/2022   BUN 28 (H) 09/13/2022   CREATININE 1.60 (H) 09/13/2022   CALCIUM 8.7 (L) 07/06/2020   GFRNONAA 42 (L) 07/06/2020   GFRAA 48 (L) 07/06/2020   Lipid Panel:  Lab Results  Component Value Date   LDLCALC 68 03/29/2020   HgbA1c: No results found for: "HGBA1C" Urine Drug Screen: No results found for: "LABOPIA", "COCAINSCRNUR", "LABBENZ", "AMPHETMU", "THCU", "LABBARB"  Alcohol Level No results found for: "ETH"  CT Head without contrast(Personally reviewed): Small age indeterminate infarct in L frontal lobe.  CT angio Head and Neck with contrast(Personally reviewed): No LVO on my evaluation.  MRI Brain: Pending  Impression   Alexander Austin is a 76 y.o. male with PMH significant for pafibb on eliquis, GERD, HLD, HTN, OSA, CHF who presents with sudden onset RUE and RLE weakness and numbness. Symptoms are most concerning for a subcortical small vessel stroke.  Recommend medicine admission for stroke workup.  Primary Diagnosis:  Cerebral infarction, unspecified.  Secondary Diagnosis: Essential (primary) hypertension, Paroxysmal atrial fibrillation, and  Obesity  Recommendations   - Frequent Neuro checks per stroke unit protocol - Recommend brain imaging with MRI Brain without contrast - Recommend obtaining TTE - Recommend obtaining Lipid panel with LDL - Please start statin if LDL > 70 - Recommend HbA1c - Hold eliquis today. Already took his AM and PM dose today. Date of resuming eliquis based on the size of stroke on MRI Brain. - SBP goal - permissive hypertension first 24 h < 220/110. Held home meds.  - Recommend Telemetry monitoring for arrythmia - Recommend bedside swallow screen prior to PO intake. - Stroke education booklet - Recommend PT/OT/SLP consult  ______________________________________________________________________   Thank you for the opportunity to take part in the care of this patient. If you have any further questions, please contact the neurology consultation attending.  Signed,  [place] Pager Number [phone] _ _ _   _ __   _ __ _ _  __ __   _ __   __ _

## 2022-09-13 NOTE — Assessment & Plan Note (Signed)
Creatinine stable.

## 2022-09-13 NOTE — Assessment & Plan Note (Signed)
Severe stenosis near the origin of the left ICA likely greater than 80% -Patient likely poor candidate for intervention given history of CABG, CHF.  However will need to consult vascular surgeon in the morning.

## 2022-09-13 NOTE — ED Triage Notes (Signed)
Pt bib ems from home c/o CVA. Pt was with home aide around 1500 walking around unassisted. When pt sat down a few minutes later to eat he had a hard time cutting his food and required help to go to the restroom. Pt denies any visual disturbances, LOC, and falls. Pt has had skin biopsy on his scalp in past. He took his Apaxiban at 1600. Pt had right side deficits with sensation and weakness.  BP 166/86 HR 70 paced [employer] RA 96% CBG 160  Chronic back pain

## 2022-09-13 NOTE — Assessment & Plan Note (Signed)
Pt has healing biopsy wound at left lateral cervical and left posterior scalp to r/o potential malignancy 2 weeks ago -needs twice daily wound care per RN

## 2022-09-13 NOTE — Assessment & Plan Note (Signed)
BMI 32 

## 2022-09-14 ENCOUNTER — Observation Stay (HOSPITAL_COMMUNITY): Payer: No Typology Code available for payment source

## 2022-09-14 ENCOUNTER — Encounter (HOSPITAL_COMMUNITY): Payer: No Typology Code available for payment source

## 2022-09-14 DIAGNOSIS — I6300 Cerebral infarction due to thrombosis of unspecified precerebral artery: Secondary | ICD-10-CM | POA: Diagnosis not present

## 2022-09-14 DIAGNOSIS — Z01810 Encounter for preprocedural cardiovascular examination: Secondary | ICD-10-CM | POA: Diagnosis not present

## 2022-09-14 DIAGNOSIS — I25118 Atherosclerotic heart disease of native coronary artery with other forms of angina pectoris: Secondary | ICD-10-CM | POA: Diagnosis present

## 2022-09-14 DIAGNOSIS — I25708 Atherosclerosis of coronary artery bypass graft(s), unspecified, with other forms of angina pectoris: Secondary | ICD-10-CM | POA: Diagnosis present

## 2022-09-14 DIAGNOSIS — I130 Hypertensive heart and chronic kidney disease with heart failure and stage 1 through stage 4 chronic kidney disease, or unspecified chronic kidney disease: Secondary | ICD-10-CM | POA: Diagnosis present

## 2022-09-14 DIAGNOSIS — I480 Paroxysmal atrial fibrillation: Secondary | ICD-10-CM | POA: Diagnosis present

## 2022-09-14 DIAGNOSIS — E876 Hypokalemia: Secondary | ICD-10-CM | POA: Diagnosis present

## 2022-09-14 DIAGNOSIS — I5043 Acute on chronic combined systolic (congestive) and diastolic (congestive) heart failure: Secondary | ICD-10-CM | POA: Diagnosis not present

## 2022-09-14 DIAGNOSIS — G629 Polyneuropathy, unspecified: Secondary | ICD-10-CM | POA: Diagnosis present

## 2022-09-14 DIAGNOSIS — I639 Cerebral infarction, unspecified: Secondary | ICD-10-CM | POA: Diagnosis present

## 2022-09-14 DIAGNOSIS — I63032 Cerebral infarction due to thrombosis of left carotid artery: Secondary | ICD-10-CM | POA: Diagnosis not present

## 2022-09-14 DIAGNOSIS — I272 Pulmonary hypertension, unspecified: Secondary | ICD-10-CM | POA: Diagnosis present

## 2022-09-14 DIAGNOSIS — N1831 Chronic kidney disease, stage 3a: Secondary | ICD-10-CM | POA: Diagnosis present

## 2022-09-14 DIAGNOSIS — I6389 Other cerebral infarction: Secondary | ICD-10-CM

## 2022-09-14 DIAGNOSIS — I110 Hypertensive heart disease with heart failure: Secondary | ICD-10-CM | POA: Diagnosis not present

## 2022-09-14 DIAGNOSIS — I2510 Atherosclerotic heart disease of native coronary artery without angina pectoris: Secondary | ICD-10-CM | POA: Diagnosis not present

## 2022-09-14 DIAGNOSIS — K219 Gastro-esophageal reflux disease without esophagitis: Secondary | ICD-10-CM | POA: Diagnosis present

## 2022-09-14 DIAGNOSIS — I6529 Occlusion and stenosis of unspecified carotid artery: Secondary | ICD-10-CM | POA: Diagnosis not present

## 2022-09-14 DIAGNOSIS — Z951 Presence of aortocoronary bypass graft: Secondary | ICD-10-CM | POA: Diagnosis not present

## 2022-09-14 DIAGNOSIS — I63132 Cerebral infarction due to embolism of left carotid artery: Secondary | ICD-10-CM | POA: Diagnosis present

## 2022-09-14 DIAGNOSIS — G4733 Obstructive sleep apnea (adult) (pediatric): Secondary | ICD-10-CM | POA: Diagnosis present

## 2022-09-14 DIAGNOSIS — E785 Hyperlipidemia, unspecified: Secondary | ICD-10-CM | POA: Diagnosis present

## 2022-09-14 DIAGNOSIS — E669 Obesity, unspecified: Secondary | ICD-10-CM | POA: Diagnosis present

## 2022-09-14 DIAGNOSIS — G8929 Other chronic pain: Secondary | ICD-10-CM | POA: Diagnosis present

## 2022-09-14 DIAGNOSIS — R29703 NIHSS score 3: Secondary | ICD-10-CM | POA: Diagnosis present

## 2022-09-14 DIAGNOSIS — M549 Dorsalgia, unspecified: Secondary | ICD-10-CM | POA: Diagnosis present

## 2022-09-14 DIAGNOSIS — F32A Depression, unspecified: Secondary | ICD-10-CM | POA: Diagnosis present

## 2022-09-14 DIAGNOSIS — I6522 Occlusion and stenosis of left carotid artery: Secondary | ICD-10-CM | POA: Diagnosis not present

## 2022-09-14 DIAGNOSIS — I708 Atherosclerosis of other arteries: Secondary | ICD-10-CM | POA: Diagnosis present

## 2022-09-14 DIAGNOSIS — G8191 Hemiplegia, unspecified affecting right dominant side: Secondary | ICD-10-CM | POA: Diagnosis present

## 2022-09-14 DIAGNOSIS — R29706 NIHSS score 6: Secondary | ICD-10-CM | POA: Diagnosis present

## 2022-09-14 DIAGNOSIS — N183 Chronic kidney disease, stage 3 unspecified: Secondary | ICD-10-CM | POA: Diagnosis not present

## 2022-09-14 DIAGNOSIS — I63232 Cerebral infarction due to unspecified occlusion or stenosis of left carotid arteries: Secondary | ICD-10-CM | POA: Diagnosis not present

## 2022-09-14 DIAGNOSIS — N179 Acute kidney failure, unspecified: Secondary | ICD-10-CM | POA: Diagnosis not present

## 2022-09-14 DIAGNOSIS — Z95810 Presence of automatic (implantable) cardiac defibrillator: Secondary | ICD-10-CM | POA: Diagnosis not present

## 2022-09-14 DIAGNOSIS — I10 Essential (primary) hypertension: Secondary | ICD-10-CM | POA: Diagnosis not present

## 2022-09-14 DIAGNOSIS — M109 Gout, unspecified: Secondary | ICD-10-CM | POA: Diagnosis present

## 2022-09-14 DIAGNOSIS — Z20822 Contact with and (suspected) exposure to covid-19: Secondary | ICD-10-CM | POA: Diagnosis present

## 2022-09-14 LAB — COMPREHENSIVE METABOLIC PANEL
ALT: 16 U/L (ref 0–44)
AST: 16 U/L (ref 15–41)
Albumin: 3.3 g/dL — ABNORMAL LOW (ref 3.5–5.0)
Alkaline Phosphatase: 83 U/L (ref 38–126)
Anion gap: 5 (ref 5–15)
BUN: 21 mg/dL (ref 8–23)
CO2: 26 mmol/L (ref 22–32)
Calcium: 8.9 mg/dL (ref 8.9–10.3)
Chloride: 111 mmol/L (ref 98–111)
Creatinine, Ser: 1.63 mg/dL — ABNORMAL HIGH (ref 0.61–1.24)
GFR, Estimated: 43 mL/min — ABNORMAL LOW (ref >60)
Glucose, Bld: 99 mg/dL (ref 70–99)
Potassium: 3.2 mmol/L — ABNORMAL LOW (ref 3.5–5.1)
Sodium: 142 mmol/L (ref 135–145)
Total Bilirubin: 0.9 mg/dL (ref 0.3–1.2)
Total Protein: 5.9 g/dL — ABNORMAL LOW (ref 6.5–8.1)

## 2022-09-14 LAB — URINALYSIS, ROUTINE W REFLEX MICROSCOPIC
Bilirubin Urine: NEGATIVE
Glucose, UA: NEGATIVE mg/dL
Hgb urine dipstick: NEGATIVE
Ketones, ur: NEGATIVE mg/dL
Leukocytes,Ua: NEGATIVE
Nitrite: NEGATIVE
Protein, ur: NEGATIVE mg/dL
Specific Gravity, Urine: 1.01 (ref 1.005–1.030)
pH: 5.5 (ref 5.0–8.0)

## 2022-09-14 LAB — LIPID PANEL
Cholesterol: 95 mg/dL (ref 0–200)
HDL: 31 mg/dL — ABNORMAL LOW (ref >40)
LDL Cholesterol: 49 mg/dL (ref 0–99)
Total CHOL/HDL Ratio: 3.1 RATIO
Triglycerides: 75 mg/dL (ref <150)
VLDL: 15 mg/dL (ref 0–40)

## 2022-09-14 LAB — RAPID URINE DRUG SCREEN, HOSP PERFORMED
Amphetamines: NOT DETECTED
Barbiturates: POSITIVE — AB
Benzodiazepines: NOT DETECTED
Cocaine: NOT DETECTED
Opiates: NOT DETECTED
Tetrahydrocannabinol: NOT DETECTED

## 2022-09-14 LAB — ECHOCARDIOGRAM COMPLETE
Area-P 1/2: 3.66 cm2
Height: 73 in
Single Plane A4C EF: 51.4 %
Weight: 3964.75 oz

## 2022-09-14 LAB — HEMOGLOBIN A1C
Hgb A1c MFr Bld: 4.7 % — ABNORMAL LOW (ref 4.8–5.6)
Mean Plasma Glucose: 88.19 mg/dL

## 2022-09-14 MED ORDER — POTASSIUM CHLORIDE CRYS ER 20 MEQ PO TBCR
40.0000 meq | EXTENDED_RELEASE_TABLET | Freq: Once | ORAL | Status: AC
  Administered 2022-09-14: 40 meq via ORAL
  Filled 2022-09-14: qty 2

## 2022-09-14 MED ORDER — PERFLUTREN LIPID MICROSPHERE
1.0000 mL | INTRAVENOUS | Status: AC | PRN
  Administered 2022-09-14: 3 mL via INTRAVENOUS

## 2022-09-14 MED ORDER — OXYCODONE HCL 5 MG PO TABS
5.0000 mg | ORAL_TABLET | Freq: Four times a day (QID) | ORAL | Status: DC | PRN
  Administered 2022-09-14 – 2022-09-27 (×9): 5 mg via ORAL
  Filled 2022-09-14 (×9): qty 1

## 2022-09-14 NOTE — Progress Notes (Addendum)
  Echocardiogram 2D Echocardiogram has been performed.  [name] 09/14/2022, 12:13 PM

## 2022-09-14 NOTE — ED Notes (Signed)
Echo at bedside

## 2022-09-14 NOTE — ED Notes (Signed)
.ED TO INPATIENT HANDOFF REPORT  ED Nurse Name and Phone #:   S Name/Age/Gender Alexander Austin 76 y.o. male Room/Bed: 004C/004C  Code Status   Code Status: Partial Code  Home/SNF/Other Home Patient oriented to: self, place, time, and situation Is this baseline? Yes   Triage Complete: Triage complete  Chief Complaint CVA (cerebral vascular accident) [facility]) [I63.9]  Triage Note Pt bib ems from home c/o CVA. Pt was with home aide around 1500 walking around unassisted. When pt sat down a few minutes later to eat he had a hard time cutting his food and required help to go to the restroom. Pt denies any visual disturbances, LOC, and falls. Pt has had skin biopsy on his scalp in past. He took his Apaxiban at 1600. Pt had right side deficits with sensation and weakness.  BP 166/86 HR 70 paced Medtronic RA 96% CBG 160  Chronic back pain   Allergies Allergies  Allergen Reactions   Isosorbide Nitrate Anaphylaxis    Other reaction(s): Cardiovascular Arrest (ALLERGY/intolerance) Can take Sublingual Nitro.    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    Level of Care/Admitting Diagnosis ED Disposition     ED Disposition  Admit   Condition  --   Linda: [facility] [[id number]]  Level of Care: Telemetry Medical [[id number]]  May admit patient to [facility] or [facility] if equivalent level of care is available:: No  Covid Evaluation: Asymptomatic - no recent exposure (last 10 days) testing not required  Diagnosis: CVA (cerebral vascular accident) [facility]) [[id number]]  Admitting Physician: Oswald Hillock Ferguson  Attending Physician: Oswald Hillock [[id number]]  Certification:: I certify this patient will need inpatient services for at least 2 midnights  Estimated Length of Stay: 3          B Medical/Surgery History Past Medical History:  Diagnosis Date   A-fib [facility])    Anxiety    Benign prostate  hyperplasia    Cancer ([place])    Kidney   Chronic kidney disease    Congestive heart failure (CHF) ([place])    CVA (cerebral infarction)    Depression    Dyspnea    GERD (gastroesophageal reflux disease)    Gout    Hematospermia 01/13/2012   History of myocardial infarction 1993   Hyperlipidemia    Hypertension    Sleep apnea    Stroke ([place])    Vitamin B 12 deficiency    Weakness of left leg 03/28/2020   Past Surgical History:  Procedure Laterality Date   ANGIOPLASTY     X 8    BACK SURGERY     CHOLECYSTECTOMY     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   SAPHENOUS VEIN GRAFT RESECTION  02/05/15     A IV Location/Drains/Wounds Patient Lines/Drains/Airways Status     Active Line/Drains/Airways     Name Placement date Placement time Site Days   Peripheral IV 09/13/22 20 G Right Antecubital 09/13/22  1832  Antecubital  1            Intake/Output Last 24 hours  Intake/Output Summary (Last 24 hours) at 09/14/2022 1357 Last data filed at 09/14/2022 0542 Gross per 24 hour  Intake --  Output 1200 ml  Net -1200 ml    Labs/Imaging Results for orders placed or performed during the hospital encounter of 09/13/22 (from the past 48 hour(s))  CBG monitoring, ED  Status: Abnormal   Collection Time: 09/13/22  5:48 PM  Result Value Ref Range   Glucose-Capillary 122 (H) 70 - 99 mg/dL    Comment: Glucose reference range applies only to samples taken after fasting for at least 8 hours.  Ethanol     Status: None   Collection Time: 09/13/22  5:50 PM  Result Value Ref Range   Alcohol, Ethyl (B) <10 <10 mg/dL    Comment: (NOTE) Lowest detectable limit for serum alcohol is 10 mg/dL.  For medical purposes only. Performed at [facility], [place] [address], [place], [place] [address]   Protime-INR     Status: Abnormal   Collection Time: 09/13/22  5:50 PM  Result Value Ref Range   Prothrombin Time 18.3 (H) 11.4 - 15.2 seconds   INR 1.5 (H) 0.8 - 1.2    Comment:  (NOTE) INR goal varies based on device and disease states. Performed at [facility], [place] [address], [place], [place] [address]   APTT     Status: None   Collection Time: 09/13/22  5:50 PM  Result Value Ref Range   aPTT 35 24 - 36 seconds    Comment: Performed at [facility] [address] [place], [place] [address]  CBC     Status: Abnormal   Collection Time: 09/13/22  5:50 PM  Result Value Ref Range   WBC 4.3 4.0 - 10.5 K/uL   RBC 4.04 (L) 4.22 - 5.81 MIL/uL   Hemoglobin 12.3 (L) 13.0 - 17.0 g/dL   HCT 36.7 (L) 39.0 - 52.0 %   MCV 90.8 80.0 - 100.0 fL   MCH 30.4 26.0 - 34.0 pg   MCHC 33.5 30.0 - 36.0 g/dL   RDW 14.5 11.5 - 15.5 %   Platelets 165 150 - 400 K/uL   nRBC 0.0 0.0 - 0.2 %    Comment: Performed at [facility], [place] [address] [place], [place] [address]  Differential     Status: Abnormal   Collection Time: 09/13/22  5:50 PM  Result Value Ref Range   Neutrophils Relative % 76 %   Neutro Abs 3.3 1.7 - 7.7 K/uL   Lymphocytes Relative 13 %   Lymphs Abs 0.6 (L) 0.7 - 4.0 K/uL   Monocytes Relative 7 %   Monocytes Absolute 0.3 0.1 - 1.0 K/uL   Eosinophils Relative 2 %   Eosinophils Absolute 0.1 0.0 - 0.5 K/uL   Basophils Relative 1 %   Basophils Absolute 0.0 0.0 - 0.1 K/uL   Immature Granulocytes 1 %   Abs Immature Granulocytes 0.04 0.00 - 0.07 K/uL    Comment: Performed at [facility] [address] [place], [place] [address]  I-stat chem 8, ED     Status: Abnormal   Collection Time: 09/13/22  5:53 PM  Result Value Ref Range   Sodium 142 135 - 145 mmol/L   Potassium 4.9 3.5 - 5.1 mmol/L   Chloride 108 98 - 111 mmol/L   BUN 28 (H) 8 - 23 mg/dL   Creatinine, Ser 1.60 (H) 0.61 - 1.24 mg/dL   Glucose, Bld 121 (H) 70 - 99 mg/dL    Comment: Glucose reference range applies only to samples taken after fasting for at least 8 hours.   Calcium, Ion 1.06 (L) 1.15 - 1.40 mmol/L   TCO2 26 22 - 32 mmol/L   Hemoglobin 11.6 (L) 13.0 - 17.0 g/dL    HCT 34.0 (L)  39.0 - 52.0 %  Resp Panel by RT-PCR (Flu A&B, Covid) Anterior Nasal Swab     Status: None   Collection Time: 09/13/22  6:31 PM   Specimen: Anterior Nasal Swab  Result Value Ref Range   SARS Coronavirus 2 by RT PCR NEGATIVE NEGATIVE    Comment: (NOTE) SARS-CoV-2 target nucleic acids are NOT DETECTED.  The SARS-CoV-2 RNA is generally detectable in upper respiratory specimens during the acute phase of infection. The lowest concentration of SARS-CoV-2 viral copies this assay can detect is 138 copies/mL. A negative result does not preclude SARS-Cov-2 infection and should not be used as the sole basis for treatment or other patient management decisions. A negative result may occur with  improper specimen collection/handling, submission of specimen other than nasopharyngeal swab, presence of viral mutation(s) within the areas targeted by this assay, and inadequate number of viral copies(<138 copies/mL). A negative result must be combined with clinical observations, patient history, and epidemiological information. The expected result is Negative.  Fact Sheet for Patients:  EntrepreneurPulse.com.au  Fact Sheet for Healthcare Providers:  IncredibleEmployment.be  This test is no t yet approved or cleared by the [place] FDA and  has been authorized for detection and/or diagnosis of SARS-CoV-2 by FDA under an Emergency Use Authorization (EUA). This EUA will remain  in effect (meaning this test can be used) for the duration of the COVID-19 declaration under Section 564(b)(1) of the Act, 21 U.S.C.section 360bbb-3(b)(1), unless the authorization is terminated  or revoked sooner.       Influenza A by PCR NEGATIVE NEGATIVE   Influenza B by PCR NEGATIVE NEGATIVE    Comment: (NOTE) The Xpert Xpress SARS-CoV-2/FLU/RSV plus assay is intended as an aid in the diagnosis of influenza from Nasopharyngeal swab specimens and should not be used  as a sole basis for treatment. Nasal washings and aspirates are unacceptable for Xpert Xpress SARS-CoV-2/FLU/RSV testing.  Fact Sheet for Patients: EntrepreneurPulse.com.au  Fact Sheet for Healthcare Providers: IncredibleEmployment.be  This test is not yet approved or cleared by the [place] FDA and has been authorized for detection and/or diagnosis of SARS-CoV-2 by FDA under an Emergency Use Authorization (EUA). This EUA will remain in effect (meaning this test can be used) for the duration of the COVID-19 declaration under Section 564(b)(1) of the Act, 21 U.S.C. section 360bbb-3(b)(1), unless the authorization is terminated or revoked.  Performed at [facility], [place] [address], [place], [place] [address]   Comprehensive metabolic panel     Status: Abnormal   Collection Time: 09/14/22  3:33 AM  Result Value Ref Range   Sodium 142 135 - 145 mmol/L   Potassium 3.2 (L) 3.5 - 5.1 mmol/L   Chloride 111 98 - 111 mmol/L   CO2 26 22 - 32 mmol/L   Glucose, Bld 99 70 - 99 mg/dL    Comment: Glucose reference range applies only to samples taken after fasting for at least 8 hours.   BUN 21 8 - 23 mg/dL   Creatinine, Ser 1.63 (H) 0.61 - 1.24 mg/dL   Calcium 8.9 8.9 - 10.3 mg/dL   Total Protein 5.9 (L) 6.5 - 8.1 g/dL   Albumin 3.3 (L) 3.5 - 5.0 g/dL   AST 16 15 - 41 U/L   ALT 16 0 - 44 U/L   Alkaline Phosphatase 83 38 - 126 U/L   Total Bilirubin 0.9 0.3 - 1.2 mg/dL   GFR, Estimated 43 (L) >60 mL/min    Comment: (NOTE) Calculated using  the CKD-EPI Creatinine Equation (2021)    Anion gap 5 5 - 15    Comment: Performed at [facility], [place] [address], [place], [place] [address]  Hemoglobin A1c     Status: Abnormal   Collection Time: 09/14/22  3:33 AM  Result Value Ref Range   Hgb A1c MFr Bld 4.7 (L) 4.8 - 5.6 %    Comment: (NOTE) Pre diabetes:          5.7%-6.4%  Diabetes:              >6.4%  Glycemic control for    <7.0% adults with diabetes    Mean Plasma Glucose 88.19 mg/dL    Comment: Performed at [facility] [address] [place], [place] [address]  Lipid panel     Status: Abnormal   Collection Time: 09/14/22  3:33 AM  Result Value Ref Range   Cholesterol 95 0 - 200 mg/dL   Triglycerides 75 <150 mg/dL   HDL 31 (L) >40 mg/dL   Total CHOL/HDL Ratio 3.1 RATIO   VLDL 15 0 - 40 mg/dL   LDL Cholesterol 49 0 - 99 mg/dL    Comment:        Total Cholesterol/HDL:CHD Risk Coronary Heart Disease Risk Table                     Men   Women  1/2 Average Risk   3.4   3.3  Average Risk       5.0   4.4  2 X Average Risk   9.6   7.1  3 X Average Risk  23.4   11.0        Use the calculated Patient Ratio above and the CHD Risk Table to determine the patient's CHD Risk.        ATP III CLASSIFICATION (LDL):  <100     mg/dL   Optimal  100-129  mg/dL   Near or Above                    Optimal  130-159  mg/dL   Borderline  160-189  mg/dL   High  >190     mg/dL   Very High Performed at [facility] [address], [place], [place] [address]   Urine rapid drug screen (hosp performed)     Status: Abnormal   Collection Time: 09/14/22  5:53 AM  Result Value Ref Range   Opiates NONE DETECTED NONE DETECTED   Cocaine NONE DETECTED NONE DETECTED   Benzodiazepines NONE DETECTED NONE DETECTED   Amphetamines NONE DETECTED NONE DETECTED   Tetrahydrocannabinol NONE DETECTED NONE DETECTED   Barbiturates POSITIVE (A) NONE DETECTED    Comment: (NOTE) DRUG SCREEN FOR MEDICAL PURPOSES ONLY.  IF CONFIRMATION IS NEEDED FOR ANY PURPOSE, NOTIFY LAB WITHIN 5 DAYS.  LOWEST DETECTABLE LIMITS FOR URINE DRUG SCREEN Drug Class                     Cutoff (ng/mL) Amphetamine and metabolites    1000 Barbiturate and metabolites    200 Benzodiazepine                 086 Tricyclics and metabolites     300 Opiates and metabolites        300 Cocaine and metabolites        300 THC  50 Performed at [facility], [place] [address] [place], [place] [address]   Urinalysis, Routine w reflex microscopic Urine, Unspecified Source     Status: None   Collection Time: 09/14/22  5:53 AM  Result Value Ref Range   Color, Urine YELLOW YELLOW   APPearance CLEAR CLEAR   Specific Gravity, Urine 1.010 1.005 - 1.030   pH 5.5 5.0 - 8.0   Glucose, UA NEGATIVE NEGATIVE mg/dL   Hgb urine dipstick NEGATIVE NEGATIVE   Bilirubin Urine NEGATIVE NEGATIVE   Ketones, ur NEGATIVE NEGATIVE mg/dL   Protein, ur NEGATIVE NEGATIVE mg/dL   Nitrite NEGATIVE NEGATIVE   Leukocytes,Ua NEGATIVE NEGATIVE    Comment: Microscopic not done on urines with negative protein, blood, leukocytes, nitrite, or glucose < 500 mg/dL. Performed at [facility], [place] [address] [place], [place] [address]    CT ANGIO HEAD NECK W WO CM (CODE STROKE)  Result Date: 09/13/2022 CLINICAL DATA:  By history: Neuro deficit, acute, stroke suspected. Right-sided weakness. EXAM: CT ANGIOGRAPHY HEAD AND NECK TECHNIQUE: Multidetector CT imaging of the head and neck was performed using the standard protocol during bolus administration of intravenous contrast. Multiplanar CT image reconstructions and MIPs were obtained to evaluate the vascular anatomy. Carotid stenosis measurements (when applicable) are obtained utilizing NASCET criteria, using the distal internal carotid diameter as the denominator. RADIATION DOSE REDUCTION: This exam was performed according to the departmental dose-optimization program which includes automated exposure control, adjustment of the mA and/or kV according to patient size and/or use of iterative reconstruction technique. CONTRAST:  61m OMNIPAQUE IOHEXOL 350 MG/ML SOLN COMPARISON:  Noncontrast head CT performed earlier today 09/13/2022. FINDINGS: CTA NECK FINDINGS Aortic arch: Standard aortic branching. Atherosclerotic plaque within the visualized aortic arch and proximal major branch vessels of the  neck. Streak and beam hardening artifact arising from a dense right-sided contrast bolus partially obscures the right subclavian artery. However, there is an apparent severe stenosis within the mid right subclavian artery (series 8, image 205). Right carotid system: CCA and ICA patent within the neck. Atherosclerotic plaque within the distal CCA, about the carotid bifurcation and within the proximal ICA. Atherosclerotic narrowing of the proximal ICA of up to 60%. Foci ulcerated plaque within the carotid bulb. Left carotid system: CCA and ICA patent within the neck. Atherosclerotic plaque at the CCA origin, within the distal CCA, about the carotid bifurcation and within the proximal to mid ICA. Stenosis is greatest at the origin of the left ICA (severe and likely greater than 80% at this site) (series 7, image 236). Vertebral arteries: Vertebral arteries patent within the neck. Atherosclerotic disease within these vessels. Most notably, there is multifocal moderate severe stenoses within the at the right vertebral artery origin and within the right V1 segment. Additionally, there is a moderate/severe stenosis at the origin of the left vertebral artery. Skeleton: Multilevel vertebral ankylosis within the cervical and upper thoracic spine. Cervical spondylosis. At C3-C4, a posterior disc osteophyte complex and ossification of the posterior longitudinal ligament contribute to apparent severe spinal canal stenosis. No acute fracture or aggressive osseous lesion. Other neck: Subcentimeter thyroid nodules, not meeting consensus criteria for ultrasound follow-up based on size. No follow-up imaging is recommended. Reference: J Am Coll Radiol. 2015 Feb;12(2): 143-50. Upper chest: No consolidation within the imaged lung apices. Review of the MIP images confirms the above findings CTA HEAD FINDINGS Anterior circulation: The intracranial internal carotid arteries are patent. Atherosclerotic plaque within both vessels. No more  than mild stenosis  of the intracranial right ICA. Up to moderate stenosis of the paraclinoid left ICA. The M1 middle cerebral arteries are patent. Atherosclerotic irregularity of the M2 and more distal MCA vessels, bilaterally. Most notably, there is a severe stenosis within a mid M1 right MCA vessel (series 12, image 40). No M2 proximal branch occlusion is identified. The anterior cerebral arteries are patent. No intracranial aneurysm is identified. Posterior circulation: The intracranial vertebral arteries are patent. Sites of severe stenosis within the proximal V4 segments, bilaterally. The basilar artery is patent. Fenestration within the proximal basilar artery. The posterior cerebral arteries are patent. Posterior communicating arteries are hypoplastic or absent, bilaterally. Venous sinuses: Within the limitations of contrast timing, no convincing thrombus. Anatomic variants: As described. Review of the MIP images confirms the above findings No emergent large vessel occlusion identified. These results were communicated to Dr. Lorrin Goodell At 6:28 pmon 9/25/2023by text page via the [facility] messaging system. IMPRESSION: CTA neck: 1. The common carotid and internal carotid arteries are patent within the neck. Atherosclerotic plaque bilaterally. Most notably, there is a severe stenosis at the origin of the left ICA (likely greater than 80%). 2. Vertebral arteries patent within the neck. Sites of moderate/severe stenosis at the right vertebral artery origin, within the right V1 segment and at the origin of the left vertebral artery. 3. 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 4. Aortic Atherosclerosis (ICD10-I70.0). 5. 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 Consider a dedicated cervical spine MRI for further evaluation. CTA head: 1. No intracranial large vessel occlusion is identified. 2. Intracranial atherosclerotic disease with multifocal stenoses, most notably as follows. 3. Up to  moderate stenosis within the paraclinoid left ICA. 4. Severe focal stenosis within a mid M2 right MCA vessel. 5. Sites of severe stenosis within the proximal V4 vertebral arteries, bilaterally. Electronically Signed   By: Kellie Simmering D.O.   On: 09/13/2022 18:31   CT HEAD CODE STROKE WO CONTRAST  Result Date: 09/13/2022 CLINICAL DATA:  Code stroke. Neuro deficit, acute, stroke suspected. Right-sided weakness. EXAM: CT HEAD WITHOUT CONTRAST TECHNIQUE: Contiguous axial images were obtained from the base of the skull through the vertex without intravenous contrast. RADIATION DOSE REDUCTION: This exam was performed according to the departmental dose-optimization program which includes automated exposure control, adjustment of the mA and/or kV according to patient size and/or use of iterative reconstruction technique. COMPARISON:  Head CT 09/07/2014 (images available, report unavailable). FINDINGS: Brain: Moderate cerebral atrophy. Small cortical/subcortical infarct within the mid to posterior left frontal lobe (within the left MCA/ACA watershed territory), new from the prior head CT of 09/07/2014, but otherwise age-indeterminate. Small chronic cortical/subcortical infarcts scattered elsewhere within the bilateral cerebral hemispheres. Background mild patchy and ill-defined hypoattenuation within the cerebral white matter, nonspecific but compatible with chronic small vessel disease. Chronic lacunar infarcts within bilateral basal ganglia. Small chronic infarcts within the bilateral cerebellar hemispheres. Vascular: No hyperdense vessel.  Atherosclerotic calcifications. Skull: No fracture or aggressive osseous lesion. Sinuses/Orbits: No mass or acute finding within the imaged orbits. Small mucous retention cysts within the right maxillary sinus. ASPECTS [facility] Stroke Program Early CT Score) - Ganglionic level infarction (caudate, lentiform nuclei, internal capsule, insula, M1-M3 cortex): 7 - Supraganglionic  infarction (M4-M6 cortex): 2 Total score (0-10 with 10 being normal): 9 These results were communicated to Dr. Lorrin Goodell At 6:10 pmon 9/25/2023by text page via the [facility] messaging system. IMPRESSION: Small cortical/subcortical infarct within the mid-to-posterior left frontal lobe (left MCA/ACA watershed territory), new from the prior head CT  of 09/07/2014, but otherwise age-indeterminate. Background parenchymal atrophy, chronic small-vessel ischemic disease and chronic infarcts, as described. Electronically Signed   By: Kellie Simmering D.O.   On: 09/13/2022 18:10    Pending Labs Unresulted Labs (From admission, onward)    None       Vitals/Pain Today's Vitals   09/14/22 1145 09/14/22 1150 09/14/22 1200 09/14/22 1300  BP: (!) 155/72  (!) 163/70 (!) 155/62  Pulse: 60  (!) 59 60  Resp: 18  (!) 27 17  Temp:  97.7 F (36.5 C)    TempSrc:  Oral    SpO2: 96%  95% 97%  Weight:      Height:      PainSc:        Isolation Precautions No active isolations  Medications Medications  acetaminophen (TYLENOL) tablet 650 mg (has no administration in time range)    Or  acetaminophen (TYLENOL) 160 MG/5ML solution 650 mg (has no administration in time range)    Or  acetaminophen (TYLENOL) suppository 650 mg (has no administration in time range)  colchicine tablet 0.3 mg (0.3 mg Oral Given 09/14/22 1112)  febuxostat (ULORIC) tablet 80 mg (80 mg Oral Given 09/14/22 1112)  atorvastatin (LIPITOR) tablet 80 mg (80 mg Oral Given 09/14/22 0920)  calcitRIOL (ROCALTROL) capsule 0.25 mcg (0.25 mcg Oral Given 09/14/22 1112)  traZODone (DESYREL) tablet 25 mg (has no administration in time range)  escitalopram (LEXAPRO) tablet 20 mg (20 mg Oral Given 09/13/22 2253)  docusate sodium (COLACE) capsule 100 mg (100 mg Oral Given 09/14/22 0921)  clopidogrel (PLAVIX) tablet 75 mg (75 mg Oral Given 09/14/22 0920)  primidone (MYSOLINE) tablet 100 mg (100 mg Oral Given 09/14/22 1111)  albuterol (PROVENTIL) (2.5 MG/3ML) 0.083%  nebulizer solution 2.5 mg (has no administration in time range)  oxyCODONE (Oxy IR/ROXICODONE) immediate release tablet 5 mg (5 mg Oral Given 09/14/22 0921)  perflutren lipid microspheres (DEFINITY) IV suspension (3 mLs Intravenous Given 09/14/22 1124)  iohexol (OMNIPAQUE) 350 MG/ML injection 80 mL (80 mLs Intravenous Contrast Given 09/13/22 1802)   stroke: early stages of recovery book ( Does not apply Given 09/14/22 1114)  potassium chloride SA (KLOR-CON M) CR tablet 40 mEq (40 mEq Oral Given 09/14/22 0920)    Mobility walks with device Moderate fall risk   Focused Assessments Neuro Assessment Handoff:  Swallow screen pass? Yes  Cardiac Rhythm: Ventricular paced NIH Stroke Scale ( + Modified Stroke Scale Criteria)  Interval: Initial Level of Consciousness (1a.)   : Alert, keenly responsive LOC Questions (1b. )   +: Answers one question correctly LOC Commands (1c. )   + : Performs both tasks correctly Best Gaze (2. )  +: Normal Visual (3. )  +: No visual loss Facial Palsy (4. )    : Normal symmetrical movements Motor Arm, Left (5a. )   +: No drift Motor Arm, Right (5b. )   +: Drift Motor Leg, Left (6a. )   +: No drift Motor Leg, Right (6b. )   +: No drift Limb Ataxia (7. ): Absent Sensory (8. )   +: Normal, no sensory loss Best Language (9. )   +: No aphasia Dysarthria (10. ): Normal Extinction/Inattention (11.)   +: No Abnormality Modified SS Total  +: 2 Complete NIHSS TOTAL: 2 Last date known well: 09/13/22 Last time known well: 1500 Neuro Assessment: Exceptions to WDL Neuro Checks:   Initial (09/13/22 1800)  Last Documented NIHSS Modified Score: 2 (09/14/22 1211) Has TPA been given? No  If patient is a Neuro Trauma and patient is going to OR before floor call report to [place] nurse: [phone] or [phone]   R Recommendations: See Admitting Provider Note  Report given to:   Additional Notes:

## 2022-09-14 NOTE — Evaluation (Signed)
Occupational Therapy Evaluation/Discharge Patient Details Name: Alexander Austin MRN: [id number] DOB: May 06, 1946 Today's Date: 09/14/2022   History of Present Illness Pt is a 76 y/o male who presented with R UE weakness and unsteady gait. CT head showed age indeterminate L frontal infarct, MRI pending if pacemaker compatible. PMH: CHF, CAD s/p CABG x 3, ICD, PPM, a fib, pulmonary hypertension, CKD 4, hx of renal cell cancer s/p nephrectomy.   Clinical Impression   PTA, pt lives alone, uses Rollator for mobility, has aide assist 5 days week/3 hours for showering and IADL assist. Pt's landlord also assists with IADLs as needed. Pt presents now at baseline for ADLs, able to mobilize in hallway using RW without physical assist or safety concerns. Pt endorses baseline DOE, "I stay short of breath". BUE strength/coordination strong and symmetrical w/o new deficits. As pt at baseline for daily tasks and has adequate assist at DC, OT to sign off at acute level without need for follow up. Please reconsult if needs change.      Recommendations for follow up therapy are one component of a multi-disciplinary discharge planning process, led by the attending physician.  Recommendations may be updated based on patient status, additional functional criteria and insurance authorization.   Follow Up Recommendations  No OT follow up    Assistance Recommended at Discharge Set up Supervision/Assistance  Patient can return home with the following A little help with bathing/dressing/bathroom;Assistance with cooking/housework    Functional Status Assessment  Patient has not had a recent decline in their functional status  Equipment Recommendations  None recommended by OT    Recommendations for Other Services       Precautions / Restrictions Precautions Precautions: Fall Restrictions Weight Bearing Restrictions: No      Mobility Bed Mobility Overal bed mobility: Needs Assistance Bed Mobility: Supine to  Sit, Sit to Supine     Supine to sit: Min assist, HOB elevated Sit to supine: Supervision   General bed mobility comments: Assist to raise trunk from stretcher but able to get BLE back into bed without assist at end of session    Transfers Overall transfer level: Needs assistance Equipment used: Rolling walker (2 wheels) Transfers: Sit to/from Stand Sit to Stand: Supervision           General transfer comment: for safety though no overt LOB or safety concerns noted      Balance Overall balance assessment: Needs assistance Sitting-balance support: Feet supported, No upper extremity supported Sitting balance-Leahy Scale: Good     Standing balance support: Bilateral upper extremity supported, No upper extremity supported, During functional activity Standing balance-Leahy Scale: Fair Standing balance comment: BUE for mobility, able to stand statically without UE support and without LOB                           ADL either performed or assessed with clinical judgement   ADL Overall ADL's : Needs assistance/impaired;At baseline Eating/Feeding: Independent   Grooming: Set up;Standing   Upper Body Bathing: Set up   Lower Body Bathing: Supervison/ safety;Sit to/from stand   Upper Body Dressing : Set up   Lower Body Dressing: Minimal assistance Lower Body Dressing Details (indicate cue type and reason): assist for socks/shoes d/t high stretcher height Toilet Transfer: Supervision/safety;Ambulation;Rolling walker (2 wheels)   Toileting- Clothing Manipulation and Hygiene: Supervision/safety;Sit to/from stand       Functional mobility during ADLs: Supervision/safety;Rolling walker (2 wheels) General ADL Comments: Pt appears to  be at baseline for ADLs with baseline DOE. BUE strength WFL without safety concerns with mobility. Has adequate assist at [employer] to See in Adequate Light: 0 Adequate Patient Visual Report: No change from baseline Vision  Assessment?: No apparent visual deficits     Perception     Praxis      Pertinent Vitals/Pain Pain Assessment Pain Assessment: No/denies pain     Hand Dominance Right   Extremity/Trunk Assessment Upper Extremity Assessment Upper Extremity Assessment: Overall WFL for tasks assessed (strength 4+/5 and symmetrical)   Lower Extremity Assessment Lower Extremity Assessment: Defer to PT evaluation   Cervical / Trunk Assessment Cervical / Trunk Assessment: Normal   Communication Communication Communication: No difficulties   Cognition Arousal/Alertness: Awake/alert Behavior During Therapy: WFL for tasks assessed/performed Overall Cognitive Status: Within Functional Limits for tasks assessed                                 General Comments: not formally tested but functional, quick witted     General Comments       Exercises     Shoulder Instructions      Home Living Family/patient expects to be discharged to:: Private residence Living Arrangements: Alone Available Help at Discharge: Personal care attendant;Friend(s);Family;Neighbor;Available PRN/intermittently Type of Home: Apartment Home Access: Level entry     Home Layout: Multi-level;Able to live on main level with bedroom/bathroom Alternate Level Stairs-Number of Steps: flight -has a stair lift. says he only goes upstairs if landlord makes dinner (lives above him)   Civil engineer, contracting Shower/Tub: Occupational psychologist: [place]: Rollator (4 wheels);Rolling Walker (2 wheels);Shower seat - built in;Electric scooter;Cane - quad          Prior Functioning/Environment Prior Level of Function : Needs assist             Mobility Comments: uses Rollator for mobility indoors and outdoors. endorses baseline SOB with mobility ADLs Comments: Typically able to dress self but aide assists if needed. Aide primarily assists with showering, IADLs around the home. Aide 5 days week/3  hours        OT Problem List: Decreased activity tolerance      OT Treatment/Interventions:      OT Goals(Current goals can be found in the care plan section) Acute Rehab OT Goals Patient Stated Goal: have some breakfast and get some sleep OT Goal Formulation: All assessment and education complete, DC therapy  OT Frequency:      Co-evaluation              AM-PAC OT "6 Clicks" Daily Activity     Outcome Measure Help from another person eating meals?: None Help from another person taking care of personal grooming?: A Little Help from another person toileting, which includes using toliet, bedpan, or urinal?: A Little Help from another person bathing (including washing, rinsing, drying)?: A Little Help from another person to put on and taking off regular upper body clothing?: A Little Help from another person to put on and taking off regular lower body clothing?: A Little 6 Click Score: 19   End of Session Equipment Utilized During Treatment: Rolling walker (2 wheels);Gait belt Nurse Communication: Mobility status  Activity Tolerance: Patient tolerated treatment well Patient left: in bed;with call bell/phone within reach;with nursing/sitter in room  OT Visit Diagnosis: Other abnormalities of gait and mobility (R26.89)  Time: 1552-0802 OT Time Calculation (min): 16 min Charges:  OT General Charges $OT Visit: 1 Visit OT Evaluation $OT Eval Low Complexity: 1 Low  Malachy Chamber, OTR/L Acute Rehab Services Office: [phone]   Layla Maw 09/14/2022, 9:26 AM

## 2022-09-14 NOTE — Progress Notes (Signed)
[facility]  PROGRESS NOTE  [name] STM:[id number] DOB: 11-Dec-1946 DOA: 09/13/2022 PCP: Windy Fast, MD   Brief HPI:   76 year old male with medical history of chronic systolic heart failure, CAD s/p CABG x3 with ICD and pacemaker, paroxysmal atrial fibrillation on Eliquis, CVA, pulmonary hypertension, CKD stage IV with history of renal cell cancer s/p nephrectomy presented with right-sided weakness.  Patient takes Plavix and Eliquis at home. In the ED he was hypertensive blood pressure 160/64 Code stroke was called, CT head showed small cortical/subcortical infarct in the left frontal lobe that was new from 2015.  CTA head and neck showed no large vessel occlusion but had severe stenosis of the origin of left ICA greater than 80%. Patient was not a candidate for TNKase, since he is on Eliquis.  Neurology was consulted    Subjective   Patient seen and examined, no new complaints.   Assessment/Plan:    CVA -CT head showed small cortical/subcortical infarct within the mid to posterior left frontal lobe, left MCA/ACA watershed territory, new from prior head CT of 09/07/2014 -CT head and neck showed severe stenosis of the origin of left ICA greater than 80%; will likely need surgery ; will obtain carotid duplex as per neurology recommendation -vascular surgery consulted, called and discussed Dr. Doren Custard who will see the patient in consult. -Continue Lipitor 80 mg daily -MRI brain pending compatibility of his Medtronic/ICD pacemaker -Echocardiogram ordered -Follow hemoglobin A1c, lipid profile PT/OT/speech therapy -Permissive hypertension, treat for BP greater than 220/120 -Neurology following  Internal carotid artery stenosis, left -Severe stenosis near the origin of left ICA likely more than 80% -Vascular surgery consulted for further recommendations  Chronic kidney disease stage IIIa -Creatinine is stable  Hypertension -Home antihypertensives on hold for permissive  hypertension  Paroxysmal atrial fibrillation -Apixaban on hold -Resumption of Eliquis as per neurology recommendation  CAD s/p CABG -Continue Plavix  Hypokalemia -Potassium 3.2 -Replace potassium -Follow BMP in am   Medications      stroke: early stages of recovery book   Does not apply Once   atorvastatin  80 mg Oral Daily   calcitRIOL  0.25 mcg Oral Daily   clopidogrel  75 mg Oral Daily   colchicine  0.3 mg Oral BID   docusate sodium  100 mg Oral Daily   escitalopram  20 mg Oral QHS   febuxostat  80 mg Oral Daily   pantoprazole  40 mg Oral Daily   potassium chloride  10 mEq Oral Daily   primidone  100 mg Oral BID     Data Reviewed:   CBG:  Recent Labs  Lab 09/13/22 1748  GLUCAP 122*    SpO2: 97 %    Vitals:   09/14/22 0500 09/14/22 0540 09/14/22 0600 09/14/22 0700  BP: (!) 158/70  (!) 161/56 (!) 146/62  Pulse: (!) 55  72 (!) 40  Resp: (!) '22  20 18  '$ Temp:  98.4 F (36.9 C)    TempSrc:  Oral    SpO2: 93%  94% 97%  Weight:      Height:          Data Reviewed:  Basic Metabolic Panel: Recent Labs  Lab 09/13/22 1753 09/14/22 0333  NA 142 142  K 4.9 3.2*  CL 108 111  CO2  --  26  GLUCOSE 121* 99  BUN 28* 21  CREATININE 1.60* 1.63*  CALCIUM  --  8.9    CBC: Recent Labs  Lab 09/13/22 1750 09/13/22 1753  WBC 4.3  --   NEUTROABS 3.3  --   HGB 12.3* 11.6*  HCT 36.7* 34.0*  MCV 90.8  --   PLT 165  --     LFT Recent Labs  Lab 09/14/22 0333  AST 16  ALT 16  ALKPHOS 83  BILITOT 0.9  PROT 5.9*  ALBUMIN 3.3*     Antibiotics: Anti-infectives (From admission, onward)    None        DVT prophylaxis: Patient takes Eliquis at home  Code Status: Partial code, no intubation mechanical ventilation  Family Communication: No family at bedside   CONSULTS neurology   Objective    Physical Examination:   General-appears in no acute distress Heart-S1-S2, regular, no murmur auscultated Lungs-clear to auscultation  bilaterally, no wheezing or crackles auscultated Abdomen-soft, nontender, no organomegaly Extremities-no edema in the lower extremities Neuro-alert, oriented x3, no focal deficit noted, motor strength 5/5 in all extremities  Status is: Inpatient:         Oswald Hillock   [facility] If 7PM-7AM, please contact night-coverage at [email or web address], Office  [phone]   09/14/2022, 8:00 AM  LOS: 0 days

## 2022-09-14 NOTE — ED Notes (Signed)
Ordered hospital bed for the pt.

## 2022-09-14 NOTE — Consult Note (Addendum)
ASSESSMENT & PLAN   SYMPTOMATIC LEFT CAROTID STENOSIS: This patient has a greater than 80% left carotid stenosis that is likely the source of his symptoms.  I would agree with neurology that we need to consider carotid revascularization.  He does have a significant cardiac history with both the history of coronary artery disease, multiple MIs, and chronic diastolic congestive heart failure.  He is also on Eliquis for atrial fibrillation.   The options for revascularization would be carotid endarterectomy versus TCAR.  He does have limited neck mobility which could be an issue for both carotid endarterectomy and transcarotid stenting however I do not think this is an absolute contraindication.  He has limited neck mobility.  CT of the neck showed some spinal stenosis at C3-C4  His carotid duplex scan is pending.  CARDIAC: This patient has a history of coronary artery disease in addition to chronic diastolic congestive heart failure, and atrial fibrillation.  He will need preoperative cardiac evaluation before considering surgery.  ANTICOAGULATION: His would have to be held for 48 hours prior to any surgery.  STAGE III CHRONIC KIDNEY DISEASE: The has a history of stage III chronic kidney disease.  REASON FOR CONSULT:    Left carotid stenosis.  The consult is requested by Dr. Darrick Meigs.  HPI:   Alexander Austin is a 76 y.o. male who was admitted yesterday after he developed a sudden onset of right upper extremity clumsiness and weakness.  In addition he noted weakness in both lower extremities.  He was admitted and work-up included a CT angiogram which showed a greater than 80% left carotid stenosis.  For this reason vascular surgery was consulted.  He had a previous left brain stroke in the 90s.  This also was associated with right-sided weakness.  He denies any expressive or receptive aphasia or amaurosis fugax.  He is on Eliquis for atrial fibrillation.  For this reason he is not on aspirin.  He  is on Plavix and a statin.  His risk factors for peripheral arterial disease include hypertension, hypercholesterolemia, and a remote history of tobacco use.  He denies any family history of premature cardiovascular disease.  Past Medical History:  Diagnosis Date   A-fib [facility])    Anxiety    Benign prostate hyperplasia    Cancer (HCC)    Kidney   Chronic kidney disease    Congestive heart failure (CHF) (HCC)    CVA (cerebral infarction)    Depression    Dyspnea    GERD (gastroesophageal reflux disease)    Gout    Hematospermia 01/13/2012   History of myocardial infarction 1993   Hyperlipidemia    Hypertension    Sleep apnea    Stroke ([place])    Vitamin B 12 deficiency    Weakness of left leg 03/28/2020    Family History  Problem Relation Age of Onset   Hypertension Mother    Heart disease Mother    Stroke Mother    Hypertension Father     SOCIAL HISTORY: Social History   Tobacco Use   Smoking status: Former    Types: Cigarettes    Quit date: 07/24/1982    Years since quitting: 40.1   Smokeless tobacco: Never  Substance Use Topics   Alcohol use: No    Alcohol/week: 0.0 standard drinks of alcohol    Allergies  Allergen Reactions   Isosorbide Nitrate Anaphylaxis    Other reaction(s): Cardiovascular Arrest (ALLERGY/intolerance) Can take Sublingual Nitro.    Shellfish-Derived Products  Other (See Comments)    Patient states shellfish triggers his gout    Current Facility-Administered Medications  Medication Dose Route Frequency Provider Last Rate Last Admin   acetaminophen (TYLENOL) tablet 650 mg  650 mg Oral Q4H PRN Tu, Ching T, DO       Or   acetaminophen (TYLENOL) 160 MG/5ML solution 650 mg  650 mg Per Tube Q4H PRN Tu, Ching T, DO       Or   acetaminophen (TYLENOL) suppository 650 mg  650 mg Rectal Q4H PRN Tu, Ching T, DO       albuterol (PROVENTIL) (2.5 MG/3ML) 0.083% nebulizer solution 2.5 mg  2.5 mg Nebulization Daily PRN Reome, Earle J, RPH        atorvastatin (LIPITOR) tablet 80 mg  80 mg Oral Daily Tu, Ching T, DO   80 mg at 09/14/22 0920   calcitRIOL (ROCALTROL) capsule 0.25 mcg  0.25 mcg Oral Daily Tu, Ching T, DO   0.25 mcg at 09/14/22 1112   clopidogrel (PLAVIX) tablet 75 mg  75 mg Oral Daily Tu, Ching T, DO   75 mg at 09/14/22 0920   colchicine tablet 0.3 mg  0.3 mg Oral BID Tu, Ching T, DO   0.3 mg at 09/14/22 1112   docusate sodium (COLACE) capsule 100 mg  100 mg Oral Daily Tu, Ching T, DO   100 mg at 09/14/22 0109   escitalopram (LEXAPRO) tablet 20 mg  20 mg Oral QHS Tu, Ching T, DO   20 mg at 09/13/22 2253   febuxostat (ULORIC) tablet 80 mg  80 mg Oral Daily Tu, Ching T, DO   80 mg at 09/14/22 1112   oxyCODONE (Oxy IR/ROXICODONE) immediate release tablet 5 mg  5 mg Oral Q6H PRN Oswald Hillock, MD   5 mg at 09/14/22 3235   potassium chloride SA (KLOR-CON M) CR tablet 40 mEq  40 mEq Oral Once [place], MD       primidone (MYSOLINE) tablet 100 mg  100 mg Oral BID Tu, Ching T, DO   100 mg at 09/14/22 1111   traZODone (DESYREL) tablet 25 mg  25 mg Oral QHS PRN Tu, Ching T, DO        REVIEW OF SYSTEMS:  '[X]'$  denotes positive finding, '[ ]'$  denotes negative finding Cardiac  Comments:  Chest pain or chest pressure:    Shortness of breath upon exertion: x   Short of breath when lying flat:    Irregular heart rhythm:        Vascular    Pain in calf, thigh, or hip brought on by ambulation:    Pain in feet at night that wakes you up from your sleep:     Blood clot in your veins:    Leg swelling:         Pulmonary    Oxygen at home:    Productive cough:     Wheezing:         Neurologic    Sudden weakness in arms or legs:  x   Sudden numbness in arms or legs:  x   Sudden onset of difficulty speaking or slurred speech:    Temporary loss of vision in one eye:     Problems with dizziness:         Gastrointestinal    Blood in stool:     Vomited blood:         Genitourinary    Burning when urinating:  Blood in urine:         Psychiatric    Major depression:         Hematologic    Bleeding problems:    Problems with blood clotting too easily:        Skin    Rashes or ulcers:        Constitutional    Fever or chills:    -  PHYSICAL EXAM:   Vitals:   09/14/22 1345 09/14/22 1358 09/14/22 1400 09/14/22 1535  BP:   (!) 150/64 (!) 165/63  Pulse: 60  60 (!) 58  Resp: (!) 22  (!) 21 17  Temp:  97.8 F (36.6 C)    TempSrc:  Oral    SpO2: 92%  98% 99%  Weight:      Height:       Body mass index is 32.69 kg/m. GENERAL: The patient is a well-nourished male, in no acute distress. The vital signs are documented above. CARDIAC: There is a regular rate and rhythm.  VASCULAR: I do not detect carotid bruits.  Note he has limited neck mobility. He has palpable radial pulses. He has palpable femoral, popliteal, dorsalis pedis, and posterior tibial pulses bilaterally. PULMONARY: There is good air exchange bilaterally without wheezing or rales. ABDOMEN: Soft and non-tender with normal pitched bowel sounds.  It is difficult to assess for an aneurysm because of his size. MUSCULOSKELETAL: There are no major deformities. NEUROLOGIC: He has some mild weakness to grip on the right. SKIN: There are no ulcers or rashes noted. PSYCHIATRIC: The patient has a normal affect.  DATA:    CT ANGIO NECK: I have reviewed the images of the CT angio of the neck.  This shows a significant, greater than 80%, left carotid stenosis.  This does appear surgically accessible although there is a small area of calcified plaque above the stenosis which is not flow-limiting.  He has an estimated 60% right ICA stenosis.  He does have vertebral artery stenoses bilaterally.  He does have severe spinal canal stenosis at C3-C4.  He also has a right subclavian artery stenosis.  CT HEAD: CT of the head shows small cortical and subcortical infarcts within the mid to posterior left frontal lobe (left MCA/ACA watershed territory).  LABS: His  creatinine today is 1.6.  GFR is 43.  Deitra Mayo [facility]

## 2022-09-14 NOTE — Evaluation (Signed)
Physical Therapy Evaluation Patient Details Name: Alexander Austin MRN: [id number] DOB: 1946-04-01 Today's Date: 09/14/2022  History of Present Illness  Pt is a 76 y/o male who presented with R UE weakness and unsteady gait. CT head showed age indeterminate L frontal infarct, MRI pending if pacemaker compatible. PMH: CHF, CAD s/p CABG x 3, ICD, PPM, a fib, pulmonary hypertension, CKD 4, hx of renal cell cancer s/p nephrectomy.  Clinical Impression  Pt admitted secondary to problem above with deficits below. Pt requiring min A for steadying throughout to perform mobility tasks in ED room. Pt would likely benefit from further cog assessment. Recommending HHPT at d/c to address mobility deficits. Will continue to follow acutely.        Recommendations for follow up therapy are one component of a multi-disciplinary discharge planning process, led by the attending physician.  Recommendations may be updated based on patient status, additional functional criteria and insurance authorization.  Follow Up Recommendations Home health PT      Assistance Recommended at Discharge Intermittent Supervision/Assistance  Patient can return home with the following  A little help with bathing/dressing/bathroom;A little help with walking and/or transfers;Assistance with cooking/housework;Help with stairs or ramp for entrance;Assist for transportation    Equipment Recommendations None recommended by PT  Recommendations for Other Services       Functional Status Assessment Patient has had a recent decline in their functional status and demonstrates the ability to make significant improvements in function in a reasonable and predictable amount of time.     Precautions / Restrictions Precautions Precautions: Fall Restrictions Weight Bearing Restrictions: No      Mobility  Bed Mobility Overal bed mobility: Needs Assistance Bed Mobility: Supine to Sit, Sit to Supine     Supine to sit: HOB elevated, Mod  assist Sit to supine: Supervision   General bed mobility comments: Assist for trunk elevation to come to sitting.    Transfers Overall transfer level: Needs assistance Equipment used: 1 person hand held assist Transfers: Sit to/from Stand Sit to Stand: Min assist           General transfer comment: Min A for lift assist and steadying.    Ambulation/Gait Ambulation/Gait assistance: Min assist Gait Distance (Feet): 3 Feet Assistive device: 1 person hand held assist Gait Pattern/deviations: Step-through pattern, Decreased stride length Gait velocity: Decreased     General Gait Details: Unsteady and requiring min A for steadying. Transport staff present to transport pt so further mobility deferred.  Stairs            Wheelchair Mobility    Modified Rankin (Stroke Patients Only)       Balance Overall balance assessment: Needs assistance Sitting-balance support: Feet supported, No upper extremity supported Sitting balance-Leahy Scale: Good     Standing balance support: Bilateral upper extremity supported, No upper extremity supported, During functional activity Standing balance-Leahy Scale: Fair Standing balance comment: BUE for mobility, able to stand statically without UE support and without LOB                             Pertinent Vitals/Pain Pain Assessment Pain Assessment: No/denies pain    Home Living Family/patient expects to be discharged to:: Private residence Living Arrangements: Alone Available Help at Discharge: Personal care attendant;Friend(s);Family;Neighbor;Available PRN/intermittently Type of Home: Apartment Home Access: Level entry     Alternate Level Stairs-Number of Steps: flight -has a stair lift. says he only goes upstairs if landlord  makes dinner (lives above him) Home Layout: Multi-level;Able to live on main level with bedroom/bathroom Home Equipment: Rollator (4 wheels);Rolling Walker (2 wheels);Shower seat - built  in;Electric scooter;Cane - quad      Prior Function Prior Level of Function : Needs assist             Mobility Comments: uses Rollator for mobility indoors and outdoors. endorses baseline SOB with mobility ADLs Comments: Typically able to dress self but aide assists if needed. Aide primarily assists with showering, IADLs around the home. Aide 5 days week/3 hours     Hand Dominance   Dominant Hand: Right    Extremity/Trunk Assessment   Upper Extremity Assessment Upper Extremity Assessment: Defer to OT evaluation    Lower Extremity Assessment Lower Extremity Assessment: Generalized weakness    Cervical / Trunk Assessment Cervical / Trunk Assessment: Kyphotic  Communication   Communication: No difficulties  Cognition Arousal/Alertness: Awake/alert Behavior During Therapy: WFL for tasks assessed/performed Overall Cognitive Status: No family/caregiver present to determine baseline cognitive functioning                                 General Comments: A and O X4. Decreased safety awareness        General Comments      Exercises     Assessment/Plan    PT Assessment Patient needs continued PT services  PT Problem List Decreased strength;Decreased balance;Decreased activity tolerance;Decreased mobility;Decreased knowledge of use of DME;Decreased safety awareness;Decreased knowledge of precautions       PT Treatment Interventions DME instruction;Functional mobility training;Gait training;Therapeutic activities;Therapeutic exercise;Balance training;Cognitive remediation;Patient/family education    PT Goals (Current goals can be found in the Care Plan section)  Acute Rehab PT Goals Patient Stated Goal: to go home PT Goal Formulation: With patient Time For Goal Achievement: 09/28/22 Potential to Achieve Goals: Good    Frequency Min 4X/week     Co-evaluation               AM-PAC PT "6 Clicks" Mobility  Outcome Measure Help needed turning  from your back to your side while in a flat bed without using bedrails?: A Little Help needed moving from lying on your back to sitting on the side of a flat bed without using bedrails?: A Lot Help needed moving to and from a bed to a chair (including a wheelchair)?: A Little Help needed standing up from a chair using your arms (e.g., wheelchair or bedside chair)?: A Little Help needed to walk in hospital room?: A Little Help needed climbing 3-5 steps with a railing? : A Lot 6 Click Score: 16    End of Session Equipment Utilized During Treatment: Gait belt Activity Tolerance: Patient tolerated treatment well Patient left: in bed;with call bell/phone within reach (in bed in ED with transport staff present) Nurse Communication: Mobility status PT Visit Diagnosis: Unsteadiness on feet (R26.81);Muscle weakness (generalized) (M62.81)    Time: 7829-5621 PT Time Calculation (min) (ACUTE ONLY): 15 min   Charges:   PT Evaluation $PT Eval Moderate Complexity: 1 Mod          Reuel Derby, PT, DPT  Acute Rehabilitation Services  Office: [phone]   Rudean Hitt 09/14/2022, 3:48 PM

## 2022-09-14 NOTE — Progress Notes (Addendum)
STROKE TEAM PROGRESS NOTE   INTERVAL HISTORY Patient is seen in his room with no family at the bedside.  Yesterday, he experienced sudden onset right sided weakness and numbness with difficulty ambulating.  He is taking Eliquis for atrial fibrillation and reports that he has not missed any doses of this medication. MRI scan of the brain is pending CT angiogram shows 80% proximal left ICA stenosis which may be symptomatic.  Vascular surgery consult and artery ultrasound are pending Vitals:   09/14/22 1145 09/14/22 1150 09/14/22 1200 09/14/22 1300  BP: (!) 155/72  (!) 163/70 (!) 155/62  Pulse: 60  (!) 59 60  Resp: 18  (!) 27 17  Temp:  97.7 F (36.5 C)    TempSrc:  Oral    SpO2: 96%  95% 97%  Weight:      Height:       CBC:  Recent Labs  Lab 09/13/22 1750 09/13/22 1753  WBC 4.3  --   NEUTROABS 3.3  --   HGB 12.3* 11.6*  HCT 36.7* 34.0*  MCV 90.8  --   PLT 165  --    Basic Metabolic Panel:  Recent Labs  Lab 09/13/22 1753 09/14/22 0333  NA 142 142  K 4.9 3.2*  CL 108 111  CO2  --  26  GLUCOSE 121* 99  BUN 28* 21  CREATININE 1.60* 1.63*  CALCIUM  --  8.9   Lipid Panel:  Recent Labs  Lab 09/14/22 0333  CHOL 95  TRIG 75  HDL 31*  CHOLHDL 3.1  VLDL 15  LDLCALC 49   HgbA1c:  Recent Labs  Lab 09/14/22 0333  HGBA1C 4.7*   Urine Drug Screen:  Recent Labs  Lab 09/14/22 0553  LABOPIA NONE DETECTED  COCAINSCRNUR NONE DETECTED  LABBENZ NONE DETECTED  AMPHETMU NONE DETECTED  THCU NONE DETECTED  LABBARB POSITIVE*    Alcohol Level  Recent Labs  Lab 09/13/22 1750  ETH <10    IMAGING past 24 hours CT ANGIO HEAD NECK W WO CM (CODE STROKE)  Result Date: 09/13/2022 CLINICAL DATA:  By history: Neuro deficit, acute, stroke suspected. Right-sided weakness. EXAM: CT ANGIOGRAPHY HEAD AND NECK TECHNIQUE: Multidetector CT imaging of the head and neck was performed using the standard protocol during bolus administration of intravenous contrast. Multiplanar CT image  reconstructions and MIPs were obtained to evaluate the vascular anatomy. Carotid stenosis measurements (when applicable) are obtained utilizing NASCET criteria, using the distal internal carotid diameter as the denominator. RADIATION DOSE REDUCTION: This exam was performed according to the departmental dose-optimization program which includes automated exposure control, adjustment of the mA and/or kV according to patient size and/or use of iterative reconstruction technique. CONTRAST:  53m OMNIPAQUE IOHEXOL 350 MG/ML SOLN COMPARISON:  Noncontrast head CT performed earlier today 09/13/2022. FINDINGS: CTA NECK FINDINGS Aortic arch: Standard aortic branching. Atherosclerotic plaque within the visualized aortic arch and proximal major branch vessels of the neck. Streak and beam hardening artifact arising from a dense right-sided contrast bolus partially obscures the right subclavian artery. However, there is an apparent severe stenosis within the mid right subclavian artery (series 8, image 205). Right carotid system: CCA and ICA patent within the neck. Atherosclerotic plaque within the distal CCA, about the carotid bifurcation and within the proximal ICA. Atherosclerotic narrowing of the proximal ICA of up to 60%. Foci ulcerated plaque within the carotid bulb. Left carotid system: CCA and ICA patent within the neck. Atherosclerotic plaque at the CCA origin, within the distal CCA, about  the carotid bifurcation and within the proximal to mid ICA. Stenosis is greatest at the origin of the left ICA (severe and likely greater than 80% at this site) (series 7, image 236). Vertebral arteries: Vertebral arteries patent within the neck. Atherosclerotic disease within these vessels. Most notably, there is multifocal moderate severe stenoses within the at the right vertebral artery origin and within the right V1 segment. Additionally, there is a moderate/severe stenosis at the origin of the left vertebral artery. Skeleton:  Multilevel vertebral ankylosis within the cervical and upper thoracic spine. Cervical spondylosis. At C3-C4, a posterior disc osteophyte complex and ossification of the posterior longitudinal ligament contribute to apparent severe spinal canal stenosis. No acute fracture or aggressive osseous lesion. Other neck: Subcentimeter thyroid nodules, not meeting consensus criteria for ultrasound follow-up based on size. No follow-up imaging is recommended. Reference: J Am Coll Radiol. 2015 Feb;12(2): 143-50. Upper chest: No consolidation within the imaged lung apices. Review of the MIP images confirms the above findings CTA HEAD FINDINGS Anterior circulation: The intracranial internal carotid arteries are patent. Atherosclerotic plaque within both vessels. No more than mild stenosis of the intracranial right ICA. Up to moderate stenosis of the paraclinoid left ICA. The M1 middle cerebral arteries are patent. Atherosclerotic irregularity of the M2 and more distal MCA vessels, bilaterally. Most notably, there is a severe stenosis within a mid M1 right MCA vessel (series 12, image 40). No M2 proximal branch occlusion is identified. The anterior cerebral arteries are patent. No intracranial aneurysm is identified. Posterior circulation: The intracranial vertebral arteries are patent. Sites of severe stenosis within the proximal V4 segments, bilaterally. The basilar artery is patent. Fenestration within the proximal basilar artery. The posterior cerebral arteries are patent. Posterior communicating arteries are hypoplastic or absent, bilaterally. Venous sinuses: Within the limitations of contrast timing, no convincing thrombus. Anatomic variants: As described. Review of the MIP images confirms the above findings No emergent large vessel occlusion identified. These results were communicated to Dr. Lorrin Goodell At 6:28 pmon 9/25/2023by text page via the [facility] messaging system. IMPRESSION: CTA neck: 1. The common carotid and internal  carotid arteries are patent within the neck. Atherosclerotic plaque bilaterally. Most notably, there is a severe stenosis at the origin of the left ICA (likely greater than 80%). 2. Vertebral arteries patent within the neck. Sites of moderate/severe stenosis at the right vertebral artery origin, within the right V1 segment and at the origin of the left vertebral artery. 3. 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 4. Aortic Atherosclerosis (ICD10-I70.0). 5. 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 Consider a dedicated cervical spine MRI for further evaluation. CTA head: 1. No intracranial large vessel occlusion is identified. 2. Intracranial atherosclerotic disease with multifocal stenoses, most notably as follows. 3. Up to moderate stenosis within the paraclinoid left ICA. 4. Severe focal stenosis within a mid M2 right MCA vessel. 5. Sites of severe stenosis within the proximal V4 vertebral arteries, bilaterally. Electronically Signed   By: Kellie Simmering D.O.   On: 09/13/2022 18:31   CT HEAD CODE STROKE WO CONTRAST  Result Date: 09/13/2022 CLINICAL DATA:  Code stroke. Neuro deficit, acute, stroke suspected. Right-sided weakness. EXAM: CT HEAD WITHOUT CONTRAST TECHNIQUE: Contiguous axial images were obtained from the base of the skull through the vertex without intravenous contrast. RADIATION DOSE REDUCTION: This exam was performed according to the departmental dose-optimization program which includes automated exposure control, adjustment of the mA and/or kV according to patient size and/or use of iterative reconstruction technique. COMPARISON:  Head CT 09/07/2014 (images available, report unavailable). FINDINGS: Brain: Moderate cerebral atrophy. Small cortical/subcortical infarct within the mid to posterior left frontal lobe (within the left MCA/ACA watershed territory), new from the prior head CT of 09/07/2014, but otherwise age-indeterminate. Small chronic cortical/subcortical  infarcts scattered elsewhere within the bilateral cerebral hemispheres. Background mild patchy and ill-defined hypoattenuation within the cerebral white matter, nonspecific but compatible with chronic small vessel disease. Chronic lacunar infarcts within bilateral basal ganglia. Small chronic infarcts within the bilateral cerebellar hemispheres. Vascular: No hyperdense vessel.  Atherosclerotic calcifications. Skull: No fracture or aggressive osseous lesion. Sinuses/Orbits: No mass or acute finding within the imaged orbits. Small mucous retention cysts within the right maxillary sinus. ASPECTS [facility] Stroke Program Early CT Score) - Ganglionic level infarction (caudate, lentiform nuclei, internal capsule, insula, M1-M3 cortex): 7 - Supraganglionic infarction (M4-M6 cortex): 2 Total score (0-10 with 10 being normal): 9 These results were communicated to Dr. Lorrin Goodell At 6:10 pmon 9/25/2023by text page via the [facility] messaging system. IMPRESSION: Small cortical/subcortical infarct within the mid-to-posterior left frontal lobe (left MCA/ACA watershed territory), new from the prior head CT of 09/07/2014, but otherwise age-indeterminate. Background parenchymal atrophy, chronic small-vessel ischemic disease and chronic infarcts, as described. Electronically Signed   By: Kellie Simmering D.O.   On: 09/13/2022 18:10    PHYSICAL EXAM General:  Alert, well-nourished, well-developed patient in no acute distress Respiratory:  Regular, unlabored respirations on room air  NEURO:  Mental Status: AA&Ox3  Speech/Language: speech is without dysarthria or aphasia.  Fluency, and comprehension intact.  Cranial Nerves:  II: PERRL. Visual fields full.  III, IV, VI: EOMI. Eyelids elevate symmetrically.  V: Sensation is intact to light touch and symmetrical to face.  VII: Smile is symmetrical. VIII: hearing intact to voice. IX, X:Phonation is normal.  BO:FBPZWCHE shrug 5/5. XII: tongue is midline without  fasciculations. Motor: 5/5 strength to all muscle groups tested.  Tone: is normal and bulk is normal Sensation- Intact to light touch bilaterally.  Coordination: FTN intact bilaterally.No drift.  Gait- deferred   ASSESSMENT/PLAN Alexander Austin is a 76 y.o. male with history of atrial fibrillation, GERD, HLD, HTN, OSA and CHF presenting with  sudden onset right sided weakness and numbness with difficulty ambulating.  He is taking Eliquis for atrial fibrillation and reports that he has not missed any doses of this medication.  MRI is pending.  Stroke:  left sided stroke likely symptomatic from proximal carotid stenosis as well as A-fib  Code Stroke CT head Age indeterminate infarct in posterior left frontal lobe Small vessel disease. Atrophy. ASPECTS 9   CTA head & neck severe stenosis at right vertebral artery origin, severe stenosis of right subclavian artery, moderate stenosis in paraclinoid right ICA, severe stenosis in right M2 MCA and severe stenosis in bilateral proximal V4 vertebral arteries MRI  pending Carotid doppler pending 2D Echo pending LDL 49 HgbA1c 4.7 VTE prophylaxis - SCDs    Diet   Diet Heart Room service appropriate? Yes; Fluid consistency: Thin   Eliquis (apixaban) daily prior to admission, now on aspirin 81 mg daily and clopidogrel 75 mg daily. Restart Eliquis pending MRI results Therapy recommendations:  pending Disposition:  pending  Hypertension Home meds:  amlodipine 10 mg daily, carvedilol 25 mg daily,  Stable Permissive hypertension (OK if < 220/120) but gradually normalize in 5-7 days Long-term BP goal normotensive  Hyperlipidemia Home meds:  atorvastatin 80 mg daily, resumed in hospital LDL 49, goal < 70 Continue statin at discharge  Atrial fibrillation Patient has history of PAF Was taking Eliquis at home, did not miss any doses Will resume Eliquis in next few days, depending on stroke size  Other Stroke Risk Factors Advanced Age >/= 37   Former cigarette smoker  Obesity, Body mass index is 32.69 kg/m., BMI >/= 30 associated with increased stroke risk, recommend weight loss, diet and exercise as appropriate  Obstructive sleep apnea Congestive heart failure  Other Active Problems none  Hospital day # [place] , MSN, AGACNP-BC [facility] See Amion for schedule and pager information 09/14/2022 1:46 PM  I have personally obtained history,examined this patient, reviewed notes, independently viewed imaging studies, participated in medical decision making and plan of care.ROS completed by me personally and pertinent positives fully documented  I have made any additions or clarifications directly to the above note. Agree with note above.  Patient with transient right-sided weakness and numbness likely from left hemispheric subcortical infarct and CT angiogram suggests 80% proximal left carotid stenosis which point symptomatic and he also has A-fib and is on anticoagulation with Eliquis which he has been compliant with.  Recommend further evaluation by checking carotid ultrasound and vascular surgery consult for elective left carotid revascularization.  Aggressive risk factor modification.  Mobilize out of bed.  Therapy consults.  Greater than 50% time during this 50-minute visit was spent in counseling and coordination of care about his symptomatic carotid stenosis and A-fib and discussion about evaluation and answering questions.  Antony Contras, MD Medical Director [facility] Pager: [phone] 09/14/2022 4:14 PM   To contact Stroke Continuity provider, please refer to [email or web address] After hours, contact General Neurology

## 2022-09-15 ENCOUNTER — Inpatient Hospital Stay (HOSPITAL_COMMUNITY): Payer: No Typology Code available for payment source

## 2022-09-15 DIAGNOSIS — I6522 Occlusion and stenosis of left carotid artery: Secondary | ICD-10-CM | POA: Diagnosis not present

## 2022-09-15 DIAGNOSIS — I639 Cerebral infarction, unspecified: Secondary | ICD-10-CM | POA: Diagnosis not present

## 2022-09-15 DIAGNOSIS — I6529 Occlusion and stenosis of unspecified carotid artery: Secondary | ICD-10-CM | POA: Diagnosis not present

## 2022-09-15 DIAGNOSIS — I10 Essential (primary) hypertension: Secondary | ICD-10-CM | POA: Diagnosis not present

## 2022-09-15 DIAGNOSIS — Z95810 Presence of automatic (implantable) cardiac defibrillator: Secondary | ICD-10-CM

## 2022-09-15 DIAGNOSIS — N1831 Chronic kidney disease, stage 3a: Secondary | ICD-10-CM | POA: Diagnosis not present

## 2022-09-15 DIAGNOSIS — Z01810 Encounter for preprocedural cardiovascular examination: Secondary | ICD-10-CM

## 2022-09-15 DIAGNOSIS — I480 Paroxysmal atrial fibrillation: Secondary | ICD-10-CM | POA: Diagnosis not present

## 2022-09-15 DIAGNOSIS — I25708 Atherosclerosis of coronary artery bypass graft(s), unspecified, with other forms of angina pectoris: Secondary | ICD-10-CM | POA: Diagnosis not present

## 2022-09-15 DIAGNOSIS — I6300 Cerebral infarction due to thrombosis of unspecified precerebral artery: Secondary | ICD-10-CM | POA: Diagnosis not present

## 2022-09-15 DIAGNOSIS — N183 Chronic kidney disease, stage 3 unspecified: Secondary | ICD-10-CM | POA: Diagnosis not present

## 2022-09-15 LAB — BASIC METABOLIC PANEL
Anion gap: 9 (ref 5–15)
BUN: 22 mg/dL (ref 8–23)
CO2: 24 mmol/L (ref 22–32)
Calcium: 9 mg/dL (ref 8.9–10.3)
Chloride: 108 mmol/L (ref 98–111)
Creatinine, Ser: 1.47 mg/dL — ABNORMAL HIGH (ref 0.61–1.24)
GFR, Estimated: 49 mL/min — ABNORMAL LOW (ref >60)
Glucose, Bld: 88 mg/dL (ref 70–99)
Potassium: 3.8 mmol/L (ref 3.5–5.1)
Sodium: 141 mmol/L (ref 135–145)

## 2022-09-15 LAB — CBC
HCT: 33.6 % — ABNORMAL LOW (ref 39.0–52.0)
Hemoglobin: 11.5 g/dL — ABNORMAL LOW (ref 13.0–17.0)
MCH: 31.1 pg (ref 26.0–34.0)
MCHC: 34.2 g/dL (ref 30.0–36.0)
MCV: 90.8 fL (ref 80.0–100.0)
Platelets: 146 10*3/uL — ABNORMAL LOW (ref 150–400)
RBC: 3.7 MIL/uL — ABNORMAL LOW (ref 4.22–5.81)
RDW: 14.4 % (ref 11.5–15.5)
WBC: 4 10*3/uL (ref 4.0–10.5)
nRBC: 0 % (ref 0.0–0.2)

## 2022-09-15 MED ORDER — APIXABAN 5 MG PO TABS
5.0000 mg | ORAL_TABLET | Freq: Two times a day (BID) | ORAL | Status: DC
  Administered 2022-09-15: 5 mg via ORAL
  Filled 2022-09-15: qty 1

## 2022-09-15 MED ORDER — HYDRALAZINE HCL 20 MG/ML IJ SOLN
10.0000 mg | Freq: Four times a day (QID) | INTRAMUSCULAR | Status: DC | PRN
  Administered 2022-09-15 – 2022-09-26 (×4): 10 mg via INTRAVENOUS
  Filled 2022-09-15 (×4): qty 1

## 2022-09-15 NOTE — Progress Notes (Signed)
Mobility Specialist: Progress Note   09/15/22 1252  Mobility  Activity Ambulated with assistance in hallway  Level of Assistance Minimal assist, patient does 75% or more  Assistive Device Four wheel walker  Distance Ambulated (ft) 210 ft  Activity Response Tolerated well  $Mobility charge 1 Mobility   Pt received in the bed and agreeable to mobility. Transferred to [facility] per request and then agreeable to hallway ambulation. Verbal cues for rollator proximity and upright posture, no c/o throughout. Pt back to bed after session with call bell and phone at his side.   [facility] Jamine Wingate Mobility Specialist Mobility Specialist [address]: [phone]

## 2022-09-15 NOTE — Progress Notes (Signed)
STROKE TEAM PROGRESS NOTE   INTERVAL HISTORY Patient is seen in his room sitting up in bed.  Looks comfortable and not in any distress.   Carotid ultrasound confirms a 60-79% left ICA stenosis.  Patient has been seen by Dr. Doren Custard from vascular surgery with plans elective carotid surgery next Monday.  Patient's neurological exam is unchanged.  Vital signs stable. Vitals:   09/15/22 0000 09/15/22 0400 09/15/22 0740 09/15/22 1152  BP: (!) 176/74 (!) 163/60 (!) 178/77 (!) 191/70  Pulse: (!) 59 60 (!) 52 (!) 59  Resp: '18 18 17 17  '$ Temp: 98.7 F (37.1 C) 98.7 F (37.1 C) 98.2 F (36.8 C) 97.9 F (36.6 C)  TempSrc: Oral Oral Oral Oral  SpO2: 95%  96% 93%  Weight:      Height:       CBC:  Recent Labs  Lab 09/13/22 1750 09/13/22 1753 09/15/22 0705  WBC 4.3  --  4.0  NEUTROABS 3.3  --   --   HGB 12.3* 11.6* 11.5*  HCT 36.7* 34.0* 33.6*  MCV 90.8  --  90.8  PLT 165  --  161*   Basic Metabolic Panel:  Recent Labs  Lab 09/14/22 0333 09/15/22 0705  NA 142 141  K 3.2* 3.8  CL 111 108  CO2 26 24  GLUCOSE 99 88  BUN 21 22  CREATININE 1.63* 1.47*  CALCIUM 8.9 9.0   Lipid Panel:  Recent Labs  Lab 09/14/22 0333  CHOL 95  TRIG 75  HDL 31*  CHOLHDL 3.1  VLDL 15  LDLCALC 49   HgbA1c:  Recent Labs  Lab 09/14/22 0333  HGBA1C 4.7*   Urine Drug Screen:  Recent Labs  Lab 09/14/22 0553  LABOPIA NONE DETECTED  COCAINSCRNUR NONE DETECTED  LABBENZ NONE DETECTED  AMPHETMU NONE DETECTED  THCU NONE DETECTED  LABBARB POSITIVE*    Alcohol Level  Recent Labs  Lab 09/13/22 1750  ETH <10    IMAGING past 24 hours MR BRAIN WO CONTRAST  Result Date: 09/15/2022 CLINICAL DATA:  Stroke, follow-up.  Right-sided weakness. EXAM: MRI HEAD WITHOUT CONTRAST TECHNIQUE: Multiplanar, multiecho pulse sequences of the brain and surrounding structures were obtained without intravenous contrast. COMPARISON:  Head CT and CTA 09/13/2022 FINDINGS: Brain: There is a small acute to early subacute  cortical and subcortical infarct in the left frontoparietal region. Multiple chronic cortical and subcortical infarcts are noted in the left greater than right frontal and parietal lobes and in the left occipital lobe. There are also small chronic bilateral cerebellar infarcts. There are a few scattered chronic cerebral and cerebellar microhemorrhages. Mild-to-moderate T2 hyperintensities elsewhere in the cerebral white matter bilaterally are nonspecific but compatible with chronic small vessel ischemic disease. T2 hyperintensities in the basal ganglia bilaterally reflect a combination of dilated perivascular spaces and chronic lacunar infarcts. Mild chronic small vessel changes are also present in the thalami and pons. No mass, midline shift, or extra-axial fluid collection is identified. There is moderate cerebral atrophy. Vascular: Major intracranial vascular flow voids are preserved. Skull and upper cervical spine: Unremarkable bone marrow signal. Sinuses/Orbits: Unremarkable orbits. Paranasal sinuses and mastoid air cells are clear. Other: None. IMPRESSION: 1. Small acute to early subacute left frontoparietal infarct. 2. Chronic ischemia with multiple chronic infarcts as above. Electronically Signed   By: Logan Bores M.D.   On: 09/15/2022 11:47   VAS US CAROTID  Result Date: 09/15/2022 Carotid Arterial Duplex Study Patient Name:  Alexander Austin  Date of Exam:  09/15/2022 Medical Rec #: [id number]     Accession #:    [id number] Date of Birth: 1946-02-22     Patient Gender: M Patient Age:   76 years Exam Location:  [facility] Procedure:      VAS US CAROTID Referring Phys: Frederich Chick LAMA --------------------------------------------------------------------------------  Indications:       CVA and Sudden onset right sided weakness and numbness with                    difficulty ambulating. CTA neck showed severe stenosis at the                    origin of the left ICA (likely greater than 80%). Risk Factors:       Hypertension, hyperlipidemia, past history of smoking. Other Factors:     Atrial fibrillation. Comparison Study:  09/13/2022- CTA neck Performing Technologist: Maudry Mayhew MHA, RDMS, RVT, RDCS  Examination Guidelines: A complete evaluation includes B-mode imaging, spectral Doppler, color Doppler, and power Doppler as needed of all accessible portions of each vessel. Bilateral testing is considered an integral part of a complete examination. Limited examinations for reoccurring indications may be performed as noted.  Right Carotid Findings: +----------+--------+-------+--------+----------------------+------------------+           PSV cm/sEDV    StenosisPlaque Description    Comments                             cm/s                                                    +----------+--------+-------+--------+----------------------+------------------+ CCA Prox  109     10                                                      +----------+--------+-------+--------+----------------------+------------------+ CCA Distal93      9                                    intimal thickening +----------+--------+-------+--------+----------------------+------------------+ ICA Prox  131     24             smooth and                                                                heterogenous                             +----------+--------+-------+--------+----------------------+------------------+ ICA Distal158     29                                                      +----------+--------+-------+--------+----------------------+------------------+  ECA       89                                           intimal thickening +----------+--------+-------+--------+----------------------+------------------+ +----------+--------+-------+----------------+-------------------+           PSV cm/sEDV cmsDescribe        Arm Pressure (mmHG)  +----------+--------+-------+----------------+-------------------+ [id number]             Multiphasic, WNL                    +----------+--------+-------+----------------+-------------------+ +---------+--------+--+--------+--+---------+ VertebralPSV cm/s31EDV cm/s10Antegrade +---------+--------+--+--------+--+---------+  Left Carotid Findings: +----------+--------+--------+--------+------------------------------+---------+           PSV cm/sEDV cm/sStenosisPlaque Description            Comments  +----------+--------+--------+--------+------------------------------+---------+ CCA Prox  100     9                                                       +----------+--------+--------+--------+------------------------------+---------+ CCA Distal71      9               focal and calcific                      +----------+--------+--------+--------+------------------------------+---------+ ICA Prox  232     35              heterogenous, irregular and   Shadowing                                   calcific                                +----------+--------+--------+--------+------------------------------+---------+ ICA Mid   170     35                                                      +----------+--------+--------+--------+------------------------------+---------+ ICA Distal116     20                                                      +----------+--------+--------+--------+------------------------------+---------+ ECA       246                     heterogenous, irregular and   shadowing                                   calcific                                +----------+--------+--------+--------+------------------------------+---------+ +----------+--------+--------+----------------+-------------------+           PSV cm/sEDV cm/sDescribe  Arm Pressure (mmHG) +----------+--------+--------+----------------+-------------------+  Subclavian117             Multiphasic, WNL                    +----------+--------+--------+----------------+-------------------+ +---------+--------+--+--------+--+---------+ VertebralPSV cm/s64EDV cm/s12Antegrade +---------+--------+--+--------+--+---------+   Summary: Right Carotid: Velocities in the right ICA are consistent with a 1-39% stenosis. Left Carotid: Velocities in the left ICA are consistent with an upper range               1-39% stenosis by end diastolic velocity, and 30-86% stenosis by               peak systolic velocity. Vertebrals:  Bilateral vertebral arteries demonstrate antegrade flow. Subclavians: Normal flow hemodynamics were seen in bilateral subclavian              arteries. *See table(s) above for measurements and observations.     Preliminary     PHYSICAL EXAM General:  Alert, well-nourished, well-developed patient in no acute distress Respiratory:  Regular, unlabored respirations on room air  NEURO:  Mental Status: AA&Ox3  Speech/Language: speech is without dysarthria or aphasia.  Fluency, and comprehension intact.  Cranial Nerves:  II: PERRL. Visual fields full.  III, IV, VI: EOMI. Eyelids elevate symmetrically.  V: Sensation is intact to light touch and symmetrical to face.  VII: Smile is symmetrical. VIII: hearing intact to voice. IX, X:Phonation is normal.  VH:QIONGEXB shrug 5/5. XII: tongue is midline without fasciculations. Motor: 5/5 strength to all muscle groups tested.  Tone: is normal and bulk is normal Sensation- Intact to light touch bilaterally.  Coordination: FTN intact bilaterally.No drift.  Gait- deferred   ASSESSMENT/PLAN Alexander Austin is a 76 y.o. male with history of atrial fibrillation, GERD, HLD, HTN, OSA and CHF presenting with  sudden onset right sided weakness and numbness with difficulty ambulating.  He is taking Eliquis for atrial fibrillation and reports that he has not missed any doses of this medication.  MRI is  pending.  Stroke:  left sided stroke likely symptomatic from proximal carotid stenosis as well as A-fib  Code Stroke CT head Age indeterminate infarct in posterior left frontal lobe Small vessel disease. Atrophy. ASPECTS 9   CTA head & neck severe stenosis at right vertebral artery origin, severe stenosis of right subclavian artery, moderate stenosis in paraclinoid right ICA, severe stenosis in right M2 MCA and severe stenosis in bilateral proximal V4 vertebral arteries MRI small acute left frontoparietal embolic infarcts.   Carotid doppler 60-79% left ICA stenosis.   2D Echo ejection fraction 50-55%.  Left atrium severely dilated. LDL 49 HgbA1c 4.7 VTE prophylaxis - SCDs    Diet   Diet Heart Room service appropriate? Yes; Fluid consistency: Thin   Eliquis (apixaban) daily prior to admission,   Restart Eliquis   Therapy recommendations:  pending Disposition:  pending  Hypertension Home meds:  amlodipine 10 mg daily, carvedilol 25 mg daily,  Stable Permissive hypertension (OK if < 220/120) but gradually normalize in 5-7 days Long-term BP goal normotensive  Hyperlipidemia Home meds:  atorvastatin 80 mg daily, resumed in hospital LDL 49, goal < 70 Continue statin at discharge  Atrial fibrillation Patient has history of PAF Was taking Eliquis at home, did not miss any doses Will resume Eliquis in next few days, depending on stroke size  Other Stroke Risk Factors Advanced Age >/= 33  Former cigarette smoker  Obesity, Body mass index is 32.69 kg/m., BMI >/= 30 associated with increased  stroke risk, recommend weight loss, diet and exercise as appropriate  Obstructive sleep apnea Congestive heart failure  Other Active Problems none  Hospital day # 1   Patient with transient right-sided weakness and numbness likely from left frontoparietal embolic infarcts likely symptomatic from moderate proximal left carotid stenosis and he also has A-fib and is on anticoagulation with  Eliquis which he has been compliant with.  Recommend elective vascular surgery  for  left carotid revascularization.  Continue Eliquis but hold 48 hours prior to scheduled surgery next week.  Aggressive risk factor modification.  Mobilize out of bed.  Therapy consults.  Discussed with Dr. Darrick Meigs.  Stroke team will sign off.  Kindly call for questions greater than 50% time during this 35 minute visit was spent in counseling and coordination of care about his symptomatic carotid stenosis and A-fib and discussion about evaluation and answering questions.  Follow-up as an outpatient stroke clinic in 2 months or call earlier if necessary  Antony Contras, [place] Pager: [phone] 09/15/2022 2:27 PM   To contact Stroke Continuity provider, please refer to [email or web address] After hours, contact General Neurology

## 2022-09-15 NOTE — Progress Notes (Signed)
   VASCULAR SURGERY ASSESSMENT & PLAN:   SYMPTOMATIC LEFT CAROTID STENOSIS: By CT angio he has a greater than 80% left carotid stenosis and he has had a left brain stroke.  Although the lesion is somewhat high I think he would be a candidate for left carotid endarterectomy.  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  This could certainly make surgery more technically challenging.  I may be able to schedule his surgery for Monday.  We can hold his Eliquis once cardiology has evaluated the patient.  We would have to hold his Eliquis 48 hours prior to the procedure.    He will need preoperative cardiac evaluation.  His carotid duplex scan is pending.  SUBJECTIVE:   No complaints this morning.  PHYSICAL EXAM:   Vitals:   09/14/22 1535 09/14/22 1930 09/15/22 0000 09/15/22 0400  BP: (!) 165/63 (!) 172/67 (!) 176/74 (!) 163/60  Pulse: (!) 58 60 (!) 59 60  Resp: '17  18 18  '$ Temp:  97.8 F (36.6 C) 98.7 F (37.1 C) 98.7 F (37.1 C)  TempSrc:  Axillary Oral Oral  SpO2: 99% 97% 95%   Weight:      Height:       No change in exam.  He does have some mild weakness to grip in the right arm.  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

## 2022-09-15 NOTE — TOC Initial Note (Signed)
Transition of Care [facility]) - Initial/Assessment Note    Patient Details  Name: Alexander Austin MRN: [id number] Date of Birth: 09-10-1946  [facility] CM/SW Contact:    Pollie Friar, RN Phone Number: 09/15/2022, 1:31 PM  Clinical Narrative:                Pt is from home alone. His landlord (that lives upstairs) does check on him and provides needed transportation. Pt has an aide that comes 5 days a week for 3 hours a day. [facility].  Pt states he has all needed DME.  Pt states his [facility] RN sets up his meds. He would also like to use them for therapy at home. CM has updated [name] with [facility].  Per pt he has a procedure on Monday. He hopes to d/c home over the weekend and return on Monday. TOC following.  Expected Discharge Plan: [place] Barriers to Discharge: Continued Medical Work up   Patient Goals and CMS Choice   CMS Medicare.gov Compare Post Acute Care list provided to:: Patient Choice offered to / list presented to : Patient  Expected Discharge Plan and Services Expected Discharge Plan: [place]   Discharge Planning Services: CM Consult Post Acute Care Choice: [place] arrangements for the past 2 months: Boarding House                           HH Arranged: RN, PT, Social Work [employer] Agency: [place] Date [place]: 09/15/22   Representative spoke with at [place]: [place] Arrangements/Services Living arrangements for the past 2 months: [place] with:: Self Patient language and need for interpreter reviewed:: Yes Do you feel safe going back to the place where you live?: Yes      Need for Family Participation in Patient Care: Yes (Comment)   Current home services: DME (Pt states he has all needed) Criminal Activity/Legal Involvement Pertinent to Current Situation/Hospitalization: No - Comment as  needed  Activities of Daily Living Home Assistive Devices/Equipment: Environmental consultant (specify type) ADL Screening (condition at time of admission) Patient's cognitive ability adequate to safely complete daily activities?: Yes Is the patient deaf or have difficulty hearing?: No Does the patient have difficulty seeing, even when wearing glasses/contacts?: No Does the patient have difficulty concentrating, remembering, or making decisions?: No Patient able to express need for assistance with ADLs?: Yes Does the patient have difficulty dressing or bathing?: No Independently performs ADLs?: Yes (appropriate for developmental age) Does the patient have difficulty walking or climbing stairs?: No Weakness of Legs: Right Weakness of Arms/Hands: Right  Permission Sought/Granted                  Emotional Assessment Appearance:: Appears stated age Attitude/Demeanor/Rapport: Engaged Affect (typically observed): Accepting Orientation: : Oriented to Self, Oriented to  Time, Oriented to Situation, Oriented to Place   Psych Involvement: No (comment)  Admission diagnosis:  CVA (cerebral vascular accident) [facility]) [I63.9] Acute CVA (cerebrovascular accident) [facility]) [I63.9] Patient Active Problem List   Diagnosis Date Noted   CVA (cerebral vascular accident) ([place]) 09/13/2022   Internal carotid artery stenosis, left 09/13/2022   Skin wound from surgical incision 09/13/2022   Gout 07/03/2020   Gout flare 05/21/2020   Chronic diastolic CHF (congestive heart failure) ([place]) 03/28/2020   Acute gout 03/07/2020  Chronic kidney disease, stage 3a ([place]) 03/07/2020   Obesity (BMI 30.0-34.9) 03/07/2020   Tremor 04/16/2015   BPH (benign prostatic hyperplasia) 04/16/2015   Chronic anticoagulation 04/16/2015   Edema 04/16/2015   Urinary retention 03/06/2015   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 ([place]) 03/06/2015   NSTEMI (non-ST elevated  myocardial infarction) ([place]) 03/06/2015   PAF (paroxysmal atrial fibrillation) ([place]) 03/06/2015   History of renal cell cancer 03/06/2015   Vitamin B12 deficiency 03/06/2015   Major depressive disorder, recurrent, in remission ([place]) 03/06/2015   History of stroke 03/06/2015   GERD (gastroesophageal reflux disease) 03/06/2015   Essential hypertension, benign 03/06/2015   Gout, arthritis 03/06/2015   PCP:  Windy Fast, MD Pharmacy:   [id number] [address]Phone: [phone]: [phone] [id number] [address] [id number]: [phone]: [address]hone: [phone]: [phone] [address]: [phone]: [phone]   Social Determinants of Health (SDOH) Interventions    Readmission Risk Interventions    07/04/2020   11:18 AM 04/03/2020   12:10 PM  Readmission Risk Prevention Plan  Transportation Screening Complete Complete  PCP or Specialist Appt within 3-5 Days  Complete  HRI or Ho[place]Complete  Social Work Consult for ReRavinelanning/Counseling  Complete  Palliative Care Screening  Not Applicable  Medication Review (RPress photographerReferral to Pharmacy Complete  PCP or Specialist appointment within 3-5 days of discharge Complete   HRI or Ho[place]armlandomplete   SW Recovery Care/Counseling Consult Complete   Palliative Care Screening Not ApQueen Cityatient Refused

## 2022-09-15 NOTE — Progress Notes (Signed)
PT Cancellation Note  Patient Details Name: Alexander Austin MRN: [id number] DOB: Mar 19, 1946   Cancelled Treatment:    Reason Eval/Treat Not Completed: Patient at procedure or test/unavailable. Pt off the floor at vascular for a Duplex. Acute PT to return as able to progress mobility.  Kittie Plater, PT, DPT Acute Rehabilitation Services Secure chat preferred Office #: [phone]    [place] Lopes 09/15/2022, 10:53 AM

## 2022-09-15 NOTE — Progress Notes (Addendum)
[facility]  PROGRESS NOTE  Alexander Austin:[id number] DOB: 27-Aug-1946 DOA: 09/13/2022 PCP: Windy Fast, MD   Brief HPI:   76 year old male with medical history of chronic systolic heart failure, CAD s/p CABG x3 with ICD and pacemaker, paroxysmal atrial fibrillation on Eliquis, CVA, pulmonary hypertension, CKD stage IV with history of renal cell cancer s/p nephrectomy presented with right-sided weakness.  Patient takes Plavix and Eliquis at home. In the ED he was hypertensive blood pressure 160/64 Code stroke was called, CT head showed small cortical/subcortical infarct in the left frontal lobe that was new from 2015.  CTA head and neck showed no large vessel occlusion but had severe stenosis of the origin of left ICA greater than 80%. Patient was not a candidate for TNKase, since he is on Eliquis.  Neurology was consulted    Subjective   Patient seen and examined, underwent carotid Doppler yesterday which showed 60 to 79% left ICA stenosis.  Vascular surgery was consulted.  Plan for revascularization of carotid artery on Monday.   Assessment/Plan:    CVA -CT head showed small cortical/subcortical infarct within the mid to posterior left frontal lobe, left MCA/ACA watershed territory, new from prior head CT of 09/07/2014 -CT head and neck showed severe stenosis of the origin of left ICA greater than 80%; carotid duplex obtained which shows 60 to 79% left ICA stenosis.   -vascular surgery consulted, called and discussed Dr. Doren Custard who saw the patient and plan for surgery on Monday 09-20-22. -Continue Lipitor 80 mg daily -MRI brain pending compatibility of his [employer]/ICD pacemaker -Echocardiogram ordered -Follow hemoglobin A1c, lipid profile PT/OT/speech therapy -Permissive hypertension, treat for BP greater than 220/120 -Neurology has signed off  Internal carotid artery stenosis, left -Severe stenosis near the origin of left ICA likely more than 80% -Vascular surgery  consulted for further recommendations  Chronic kidney disease stage IIIa -Creatinine is stable  Hypertension -Home antihypertensives on hold for permissive hypertension -Okay for blood pressure less than 220/120 -We will gradually restart home medications  Paroxysmal atrial fibrillation -Apixaban on hold -Vascular surgery recommended to continue with Plavix for now and start Eliquis post op -Discussed with Dr. Leonie Man, patient is currently on Plavix.  We will start Eliquis 5 mg p.o. twice daily starting from tonight and hold it 48 hours prior to surgery.  CAD s/p CABG -Continue Plavix  Hypokalemia -Replete  Medications     atorvastatin  80 mg Oral Daily   calcitRIOL  0.25 mcg Oral Daily   clopidogrel  75 mg Oral Daily   colchicine  0.3 mg Oral BID   docusate sodium  100 mg Oral Daily   escitalopram  20 mg Oral QHS   febuxostat  80 mg Oral Daily   primidone  100 mg Oral BID     Data Reviewed:   CBG:  Recent Labs  Lab 09/13/22 1748  GLUCAP 122*    SpO2: 93 %    Vitals:   09/15/22 0000 09/15/22 0400 09/15/22 0740 09/15/22 1152  BP: (!) 176/74 (!) 163/60 (!) 178/77 (!) 191/70  Pulse: (!) 59 60 (!) 52 (!) 59  Resp: '18 18 17 17  '$ Temp: 98.7 F (37.1 C) 98.7 F (37.1 C) 98.2 F (36.8 C) 97.9 F (36.6 C)  TempSrc: Oral Oral Oral Oral  SpO2: 95%  96% 93%  Weight:      Height:          Data Reviewed:  Basic Metabolic Panel: Recent Labs  Lab 09/13/22 1753 09/14/22  5361 09/15/22 0705  NA 142 142 141  K 4.9 3.2* 3.8  CL 108 111 108  CO2  --  26 24  GLUCOSE 121* 99 88  BUN 28* 21 22  CREATININE 1.60* 1.63* 1.47*  CALCIUM  --  8.9 9.0    CBC: Recent Labs  Lab 09/13/22 1750 09/13/22 1753 09/15/22 0705  WBC 4.3  --  4.0  NEUTROABS 3.3  --   --   HGB 12.3* 11.6* 11.5*  HCT 36.7* 34.0* 33.6*  MCV 90.8  --  90.8  PLT 165  --  146*    LFT Recent Labs  Lab 09/14/22 0333  AST 16  ALT 16  ALKPHOS 83  BILITOT 0.9  PROT 5.9*  ALBUMIN 3.3*      Antibiotics: Anti-infectives (From admission, onward)    None        DVT prophylaxis: Patient takes Eliquis at home  Code Status: Partial code, no intubation mechanical ventilation  Family Communication: No family at bedside   CONSULTS neurology   Objective    Physical Examination:   General-appears in no acute distress Heart-S1-S2, regular, no murmur auscultated Lungs-clear to auscultation bilaterally, no wheezing or crackles auscultated Abdomen-soft, nontender, no organomegaly Extremities-no edema in the lower extremities Neuro-alert, oriented x3, no focal deficit noted  Status is: Inpatient:         Oswald Hillock   [facility] If 7PM-7AM, please contact night-coverage at [email or web address], Office  [phone]   09/15/2022, 3:22 PM  LOS: 1 day

## 2022-09-15 NOTE — Progress Notes (Signed)
Carotid artery duplex completed. Refer to "CV Proc" under chart review to view preliminary results.  09/15/2022 9:41 AM Kelby Aline., MHA, RVT, RDCS, RDMS

## 2022-09-15 NOTE — Consult Note (Signed)
Cardiology Consultation   Patient ID: Alexander Austin MRN: [id number]; DOB: 1946/01/12  Admit date: 09/13/2022 Date of Consult: 09/15/2022  PCP:  Windy Fast, [place] [facility] Cardiologist:  None        Patient Profile:   Alexander Austin is a 76 y.o. male with a hx of CAD (history of CABG x1 in 1993 SVG-RCA and multiple stents, CABG redo in 2016) and 4 stents/8 angioplasties, ischemic dilated cardiomyopathy, chronic heart failure, s/p biventricular ICD, paroxysmal atrial fibrillation on Eliquis, CKD, renal cell carcinoma s/p partial nephrectomy), CVA (2014), cervical spinal stenosis who is being seen 09/15/2022 for pre-surgical evaluation at the request of Dr. Scot Dock.  History of Present Illness:   On 9/25, Mr. Swaim was reportedly sitting at home when he developed sudden right hand weakness, followed by development of an unsteady gate with RLE weakness (uses rollator at baseline due to peripheral neuropathy). He was brought to the ED for further evaluation, code stroke. 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 Patient was not a candidate for TNKase due to daily plavix/eliquis. Neurology was consulted and recommendations given for MRI brain w/o contrast. Based on carotid stenosis findings, vascular surgery consulted. They are planning for carotid endarterectomy vs TCAR on Monday.   MRI completed today, notable for small acute to early subacute left frontoparietal infarct and chronic ischemia with multiple chronic infarcts. He also underwent TTE today which showed LVEF 50-55% and no region wall motion abnormalities. Grade II diastolic dysfunction, LA severely dilated. Carotid duplex today with 60-79% stenosis by peak systolic velocity.    Patient has an extensive cardiac history. Chart review shows that patient already with multiple stents underwent CABG x1 in 1993 with SVG-RCA. He was subsequently diagnosed with NSTEMI in  2016 and LHC was notable for 3 vessel disease with proximal LAD lesion, OM lesion, occluded RCA with occluded SVG-RCA graft. LIMA in situ. He underwent successful CABG redo at that time. It appears that patient developed paroxysmal afib while admitted post CABG redo and he was started on [facility]. His most recent LHC was November 2017 with findings of native RCA occluded, LAD small & diffusely diseased, patent stent in OM2 but side branch occluded, LIMA-LAD patent, SVG-OM occluded, SVG from OP1 to RCA occluded and 2nd SVG to RCA patent. At the time of this NSTEMI/LHC patient had notably reduced LVEF and decision was made to place a biventricular ICD. Since that time, he has had recovery of LVEF to 50-55%.   Past Medical History:  Diagnosis Date   A-fib [facility])    Anxiety    Benign prostate hyperplasia    Cancer ([place])    Kidney   Chronic kidney disease    Congestive heart failure (CHF) (HCC)    CVA (cerebral infarction)    Depression    Dyspnea    GERD (gastroesophageal reflux disease)    Gout    Hematospermia 01/13/2012   History of myocardial infarction 1993   Hyperlipidemia    Hypertension    Sleep apnea    Stroke [facility])    Vitamin B 12 deficiency    Weakness of left leg 03/28/2020    Past Surgical History:  Procedure Laterality Date   ANGIOPLASTY     X 8    BACK SURGERY     CHOLECYSTECTOMY     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   SAPHENOUS VEIN GRAFT RESECTION  02/05/15  Inpatient Medications: Scheduled Meds:  atorvastatin  80 mg Oral Daily   calcitRIOL  0.25 mcg Oral Daily   clopidogrel  75 mg Oral Daily   colchicine  0.3 mg Oral BID   docusate sodium  100 mg Oral Daily   escitalopram  20 mg Oral QHS   febuxostat  80 mg Oral Daily   primidone  100 mg Oral BID   Continuous Infusions:  PRN Meds: acetaminophen **OR** acetaminophen (TYLENOL) oral liquid 160 mg/5 mL **OR** acetaminophen, albuterol, hydrALAZINE, oxyCODONE, traZODone  Allergies:    Allergies   Allergen Reactions   Isosorbide Nitrate Anaphylaxis    Other reaction(s): Cardiovascular Arrest (ALLERGY/intolerance) Can take Sublingual Nitro.    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    Social History:   Social History   Socioeconomic History   Marital status: Divorced    Spouse name: Not on file   Number of children: Not on file   Years of education: Not on file   Highest education level: Not on file  Occupational History   Occupation: retired  Tobacco Use   Smoking status: Former    Types: Cigarettes    Quit date: 07/24/1982    Years since quitting: 40.1   Smokeless tobacco: Never  Vaping Use   Vaping Use: Never used  Substance and Sexual Activity   Alcohol use: No    Alcohol/week: 0.0 standard drinks of alcohol   Drug use: No   Sexual activity: Not Currently  Other Topics Concern   Not on file  Social History Narrative   Not on file   Social Determinants of Health   Financial Resource Strain: Not on file  Food Insecurity: No [place] (09/14/2022)   Hunger Vital Sign    Worried About Running Out of Food in the Last Year: Never true    Ran Out of Food in the Last Year: Never true  Transportation Needs: No Transportation Needs (09/14/2022)   PRAPARE - Hydrologist (Medical): No    Lack of Transportation (Non-Medical): No  Physical Activity: Not on file  Stress: Not on file  Social Connections: Not on file  Intimate Partner Violence: Not At Risk (09/14/2022)   Humiliation, Afraid, Rape, and Kick questionnaire    Fear of Current or Ex-Partner: No    Emotionally Abused: No    Physically Abused: No    Sexually Abused: No    Family History:    Family History  Problem Relation Age of Onset   Hypertension Mother    Heart disease Mother    Stroke Mother    Hypertension Father      ROS:  Please see the history of present illness.   All other ROS reviewed and  negative.     Physical Exam/Data:   Vitals:   09/15/22 0000 09/15/22 0400 09/15/22 0740 09/15/22 1152  BP: (!) 176/74 (!) 163/60 (!) 178/77 (!) 191/70  Pulse: (!) 59 60 (!) 52 (!) 59  Resp: '18 18 17 17  '$ Temp: 98.7 F (37.1 C) 98.7 F (37.1 C) 98.2 F (36.8 C) 97.9 F (36.6 C)  TempSrc: Oral Oral Oral Oral  SpO2: 95%  96% 93%  Weight:      Height:        Intake/Output Summary (Last 24 hours) at 09/15/2022 1521 Last data filed at 09/15/2022 0400 Gross per 24 hour  Intake 100 ml  Output 600 ml  Net -500 ml  09/13/2022    6:20 PM 09/13/2022    5:00 PM 07/07/2020    5:00 AM  Last 3 Weights  Weight (lbs) 247 lb 12.8 oz 247 lb 12.8 oz 245 lb 9.5 oz  Weight (kg) 112.4 kg 112.4 kg 111.4 kg     Body mass index is 32.69 kg/m.  Physical exam:    Overweight white male Left bruit Left Med[employer]onic Bi V AICD Abdomen bening No edema Mild dysarthria and RLE weakness improving   Relevant CV Studies: TTE EF 50-55% no valve dx  Laboratory Data:  High Sensitivity Troponin:  No results for input(s): "TROPONINIHS" in the last 720 hours.   Chemistry Recent Labs  Lab 09/13/22 1753 09/14/22 0333 09/15/22 0705  NA 142 142 141  K 4.9 3.2* 3.8  CL 108 111 108  CO2  --  26 24  GLUCOSE 121* 99 88  BUN 28* 21 22  CREATININE 1.60* 1.63* 1.47*  CALCIUM  --  8.9 9.0  GFRNONAA  --  43* 49*  ANIONGAP  --  5 9    Recent Labs  Lab 09/14/22 0333  PROT 5.9*  ALBUMIN 3.3*  AST 16  ALT 16  ALKPHOS 83  BILITOT 0.9   Lipids  Recent Labs  Lab 09/14/22 0333  CHOL 95  TRIG 75  HDL 31*  LDLCALC 49  CHOLHDL 3.1    Hematology Recent Labs  Lab 09/13/22 1750 09/13/22 1753 09/15/22 0705  WBC 4.3  --  4.0  RBC 4.04*  --  3.70*  HGB 12.3* 11.6* 11.5*  HCT 36.7* 34.0* 33.6*  MCV 90.8  --  90.8  MCH 30.4  --  31.1  MCHC 33.5  --  34.2  RDW 14.5  --  14.4  PLT 165  --  146*   Thyroid No results for input(s): "TSH", "FREET4" in the last 168 hours.  BNPNo results for  input(s): "BNP", "PROBNP" in the last 168 hours.  DDimer No results for input(s): "DDIMER" in the last 168 hours.   Radiology/Studies:  VAS US CAROTID  Result Date: 09/15/2022 Carotid Arterial Duplex Study Patient Name:  MANJINDER BREAU  Date of Exam:   09/15/2022 Medical Rec #: [id number]     Accession #:    [id number] Date of Birth: [birth year]     Patient Gender: M Patient Age:   1 years Exam Location:  [facility] Procedure:      VAS US CAROTID Referring Phys: Frederich Chick LAMA --------------------------------------------------------------------------------  Indications:       CVA and Sudden onset right sided weakness and numbness with                    difficulty ambulating. CTA neck showed severe stenosis at the                    origin of the left ICA (likely greater than 80%). Risk Factors:      Hypertension, hyperlipidemia, past history of smoking. Other Factors:     Atrial fibrillation. Comparison Study:  09/13/2022- CTA neck Performing Technologist: Maudry Mayhew MHA, RDMS, RVT, RDCS  Examination Guidelines: A complete evaluation includes B-mode imaging, spectral Doppler, color Doppler, and power Doppler as needed of all accessible portions of each vessel. Bilateral testing is considered an integral part of a complete examination. Limited examinations for reoccurring indications may be performed as noted.  Right Carotid Findings: +----------+--------+-------+--------+----------------------+------------------+           PSV cm/sEDV  StenosisPlaque Description    Comments                             cm/s                                                    +----------+--------+-------+--------+----------------------+------------------+ CCA Prox  109     10                                                      +----------+--------+-------+--------+----------------------+------------------+ CCA Distal93      9                                    intimal thickening  +----------+--------+-------+--------+----------------------+------------------+ ICA Prox  131     24             smooth and                                                                heterogenous                             +----------+--------+-------+--------+----------------------+------------------+ ICA Distal158     29                                                      +----------+--------+-------+--------+----------------------+------------------+ ECA       89                                           intimal thickening +----------+--------+-------+--------+----------------------+------------------+ +----------+--------+-------+----------------+-------------------+           PSV cm/sEDV cmsDescribe        Arm Pressure (mmHG) +----------+--------+-------+----------------+-------------------+ [id number]             Multiphasic, WNL                    +----------+--------+-------+----------------+-------------------+ +---------+--------+--+--------+--+---------+ VertebralPSV cm/s31EDV cm/s10Antegrade +---------+--------+--+--------+--+---------+  Left Carotid Findings: +----------+--------+--------+--------+------------------------------+---------+           PSV cm/sEDV cm/sStenosisPlaque Description            Comments  +----------+--------+--------+--------+------------------------------+---------+ CCA Prox  100     9                                                       +----------+--------+--------+--------+------------------------------+---------+ CCA Distal71      9  focal and calcific                      +----------+--------+--------+--------+------------------------------+---------+ ICA Prox  232     35              heterogenous, irregular and   Shadowing                                   calcific                                 +----------+--------+--------+--------+------------------------------+---------+ ICA Mid   170     35                                                      +----------+--------+--------+--------+------------------------------+---------+ ICA Distal116     20                                                      +----------+--------+--------+--------+------------------------------+---------+ ECA       246                     heterogenous, irregular and   shadowing                                   calcific                                +----------+--------+--------+--------+------------------------------+---------+ +----------+--------+--------+----------------+-------------------+           PSV cm/sEDV cm/sDescribe        Arm Pressure (mmHG) +----------+--------+--------+----------------+-------------------+ Subclavian117             Multiphasic, WNL                    +----------+--------+--------+----------------+-------------------+ +---------+--------+--+--------+--+---------+ VertebralPSV cm/s64EDV cm/s12Antegrade +---------+--------+--+--------+--+---------+   Summary: Right Carotid: Velocities in the right ICA are consistent with a 1-39% stenosis. Left Carotid: Velocities in the left ICA are consistent with an upper range               1-39% stenosis by end diastolic velocity, and 20-94% stenosis by               peak systolic velocity. Vertebrals:  Bilateral vertebral arteries demonstrate antegrade flow. Subclavians: Normal flow hemodynamics were seen in bilateral subclavian              arteries. *See table(s) above for measurements and observations.  Electronically signed by Antony Contras MD on 09/15/2022 at 2:33:21 PM.    Final    MR BRAIN WO CONTRAST  Result Date: 09/15/2022 CLINICAL DATA:  Stroke, follow-up.  Right-sided weakness. EXAM: MRI HEAD WITHOUT CONTRAST TECHNIQUE: Multiplanar, multiecho pulse sequences of the brain and surrounding structures were  obtained without intravenous contrast. COMPARISON:  Head CT and CTA 09/13/2022 FINDINGS: Brain: There is a small acute to early subacute cortical and subcortical infarct in the  left frontoparietal region. Multiple chronic cortical and subcortical infarcts are noted in the left greater than right frontal and parietal lobes and in the left occipital lobe. There are also small chronic bilateral cerebellar infarcts. There are a few scattered chronic cerebral and cerebellar microhemorrhages. Mild-to-moderate T2 hyperintensities elsewhere in the cerebral white matter bilaterally are nonspecific but compatible with chronic small vessel ischemic disease. T2 hyperintensities in the basal ganglia bilaterally reflect a combination of dilated perivascular spaces and chronic lacunar infarcts. Mild chronic small vessel changes are also present in the thalami and pons. No mass, midline shift, or extra-axial fluid collection is identified. There is moderate cerebral atrophy. Vascular: Major intracranial vascular flow voids are preserved. Skull and upper cervical spine: Unremarkable bone marrow signal. Sinuses/Orbits: Unremarkable orbits. Paranasal sinuses and mastoid air cells are clear. Other: None. IMPRESSION: 1. Small acute to early subacute left frontoparietal infarct. 2. Chronic ischemia with multiple chronic infarcts as above. Electronically Signed   By: Logan Bores M.D.   On: 09/15/2022 11:47   ECHOCARDIOGRAM COMPLETE  Result Date: 09/14/2022    ECHOCARDIOGRAM REPORT   Patient Name:   ANIVAL PASHA Date of Exam: 09/14/2022 Medical Rec #:  [id number]    Height:       73.0 in Accession #:    [id number]   Weight:       247.8 lb Date of Birth:  [birth year]    BSA:          2.357 m Patient Age:    69 years     BP:           155/72 mmHg Patient Gender: M            HR:           63 bpm. Exam Location:  Inpatient Procedure: 2D Echo, Cardiac Doppler, Color Doppler and Intracardiac            Opacification Agent Indications:     Stroke  History:        Patient has no prior history of Echocardiogram examinations. CAD                 and NSTEMI, CVA, Pulmonary HTN and Carotid Disease,                 Arrythmias:Atrial Fibrillation, Signs/Symptoms:Edema; Risk                 Factors:Hypertension and Current Smoker.  Sonographer:    Greer Pickerel Sonographer#2:  Raquel Sarna Senior Referring Phys: [id number] [place] T TU  Sonographer Comments: Technically difficult study due to poor echo windows, suboptimal parasternal window, suboptimal apical window, no subcostal window and patient is obese. Image acquisition challenging due to respiratory motion. IMPRESSIONS  1. Left ventricular ejection fraction, by estimation, is 50 to 55%. The left ventricle has low normal function. The left ventricle has no regional wall motion abnormalities. Left ventricular diastolic parameters are consistent with Grade II diastolic dysfunction (pseudonormalization).  2. Right ventricular systolic function is normal. The right ventricular size is normal. There is normal pulmonary artery systolic pressure.  3. Left atrial size was severely dilated.  4. The mitral valve is grossly normal. No evidence of mitral valve regurgitation. No evidence of mitral stenosis.  5. The aortic valve was not well visualized. Aortic valve regurgitation is trivial. Comparison(s): No significant change from prior study. Prior study not done with contrast. FINDINGS  Left Ventricle: Left ventricular ejection fraction, by estimation, is 50 to 55%. The left ventricle  has low normal function. The left ventricle has no regional wall motion abnormalities. Definity contrast agent was given IV to delineate the left ventricular endocardial borders. The left ventricular internal cavity size was normal in size. Suboptimal image quality limits for assessment of left ventricular hypertrophy. Left ventricular diastolic parameters are consistent with Grade II diastolic dysfunction (pseudonormalization). Right Ventricle:  The right ventricular size is normal. No increase in right ventricular wall thickness. Right ventricular systolic function is normal. There is normal pulmonary artery systolic pressure. The tricuspid regurgitant velocity is 2.24 m/s, and  with an assumed right atrial pressure of 8 mmHg, the estimated right ventricular systolic pressure is 91.5 mmHg. Left Atrium: Left atrial size was severely dilated. Right Atrium: Right atrial size was normal in size. Pericardium: There is no evidence of pericardial effusion. Mitral Valve: The mitral valve is grossly normal. No evidence of mitral valve regurgitation. No evidence of mitral valve stenosis. Tricuspid Valve: The tricuspid valve is grossly normal. Tricuspid valve regurgitation is not demonstrated. No evidence of tricuspid stenosis. Aortic Valve: The aortic valve was not well visualized. Aortic valve regurgitation is trivial. Pulmonic Valve: The pulmonic valve was normal in structure. Pulmonic valve regurgitation is mild. No evidence of pulmonic stenosis. Aorta: The aortic root is normal in size and structure. IAS/Shunts: No atrial level shunt detected by color flow Doppler.  LEFT VENTRICLE PLAX 2D LVOT diam:     2.10 cm      Diastology LV SV:         73           LV e' medial:    5.93 cm/s LV SV Index:   31           LV E/e' medial:  15.6 LVOT Area:     3.46 cm     LV e' lateral:   6.15 cm/s                             LV E/e' lateral: 15.0  LV Volumes (MOD) LV vol d, MOD A4C: 198.0 ml LV vol s, MOD A4C: 96.2 ml LV SV MOD A4C:     198.0 ml RIGHT VENTRICLE RV S prime:     5.40 cm/s TAPSE (M-mode): 2.0 cm LEFT ATRIUM              Index        RIGHT ATRIUM           Index LA Vol (A2C):   113.0 ml 47.95 ml/m  RA Area:     22.90 cm LA Vol (A4C):   150.0 ml 63.65 ml/m  RA Volume:   64.00 ml  27.16 ml/m LA Biplane Vol: 145.0 ml 61.52 ml/m  AORTIC VALVE             PULMONIC VALVE LVOT Vmax:   91.10 cm/s  PR End Diast Vel: 2.84 msec LVOT Vmean:  68.100 cm/s LVOT VTI:     0.212 m  AORTA Ao Root diam: 4.10 cm Ao Asc diam:  3.50 cm MITRAL VALVE               TRICUSPID VALVE MV Area (PHT): 3.66 cm    TR Peak grad:   20.1 mmHg MV Decel Time: 207 msec    TR Vmax:        224.00 cm/s MV E velocity: 92.30 cm/s MV A velocity: 72.40 cm/s  SHUNTS MV E/A ratio:  1.27  Systemic VTI:  0.21 m                            Systemic Diam: 2.10 cm Rudean Haskell MD Electronically signed by Rudean Haskell MD Signature Date/Time: 09/14/2022/2:10:45 PM    Final    CT ANGIO HEAD NECK W WO CM (CODE STROKE)  Result Date: 09/13/2022 CLINICAL DATA:  By history: Neuro deficit, acute, stroke suspected. Right-sided weakness. EXAM: CT ANGIOGRAPHY HEAD AND NECK TECHNIQUE: Multidetector CT imaging of the head and neck was performed using the standard protocol during bolus administration of intravenous contrast. Multiplanar CT image reconstructions and MIPs were obtained to evaluate the vascular anatomy. Carotid stenosis measurements (when applicable) are obtained utilizing NASCET criteria, using the distal internal carotid diameter as the denominator. RADIATION DOSE REDUCTION: This exam was performed according to the departmental dose-optimization program which includes automated exposure control, adjustment of the mA and/or kV according to patient size and/or use of iterative reconstruction technique. CONTRAST:  26m OMNIPAQUE IOHEXOL 350 MG/ML SOLN COMPARISON:  Noncontrast head CT performed earlier today 09/13/2022. FINDINGS: CTA NECK FINDINGS Aortic arch: Standard aortic branching. Atherosclerotic plaque within the visualized aortic arch and proximal major branch vessels of the neck. Streak and beam hardening artifact arising from a dense right-sided contrast bolus partially obscures the right subclavian artery. However, there is an apparent severe stenosis within the mid right subclavian artery (series 8, image 205). Right carotid system: CCA and ICA patent within the neck. Atherosclerotic  plaque within the distal CCA, about the carotid bifurcation and within the proximal ICA. Atherosclerotic narrowing of the proximal ICA of up to 60%. Foci ulcerated plaque within the carotid bulb. Left carotid system: CCA and ICA patent within the neck. Atherosclerotic plaque at the CCA origin, within the distal CCA, about the carotid bifurcation and within the proximal to mid ICA. Stenosis is greatest at the origin of the left ICA (severe and likely greater than 80% at this site) (series 7, image 236). Vertebral arteries: Vertebral arteries patent within the neck. Atherosclerotic disease within these vessels. Most notably, there is multifocal moderate severe stenoses within the at the right vertebral artery origin and within the right V1 segment. Additionally, there is a moderate/severe stenosis at the origin of the left vertebral artery. Skeleton: Multilevel vertebral ankylosis within the cervical and upper thoracic spine. Cervical spondylosis. At C3-C4, a posterior disc osteophyte complex and ossification of the posterior longitudinal ligament contribute to apparent severe spinal canal stenosis. No acute fracture or aggressive osseous lesion. Other neck: Subcentimeter thyroid nodules, not meeting consensus criteria for ultrasound follow-up based on size. No follow-up imaging is recommended. Reference: J Am Coll Radiol. 2015 Feb;12(2): 143-50. Upper chest: No consolidation within the imaged lung apices. Review of the MIP images confirms the above findings CTA HEAD FINDINGS Anterior circulation: The intracranial internal carotid arteries are patent. Atherosclerotic plaque within both vessels. No more than mild stenosis of the intracranial right ICA. Up to moderate stenosis of the paraclinoid left ICA. The M1 middle cerebral arteries are patent. Atherosclerotic irregularity of the M2 and more distal MCA vessels, bilaterally. Most notably, there is a severe stenosis within a mid M1 right MCA vessel (series 12, image  40). No M2 proximal branch occlusion is identified. The anterior cerebral arteries are patent. No intracranial aneurysm is identified. Posterior circulation: The intracranial vertebral arteries are patent. Sites of severe stenosis within the proximal V4 segments, bilaterally. The basilar artery is patent. Fenestration within the  proximal basilar artery. The posterior cerebral arteries are patent. Posterior communicating arteries are hypoplastic or absent, bilaterally. Venous sinuses: Within the limitations of contrast timing, no convincing thrombus. Anatomic variants: As described. Review of the MIP images confirms the above findings No emergent large vessel occlusion identified. These results were communicated to Dr. Lorrin Goodell At 6:28 pmon 9/25/2023by text page via the [facility] messaging system. IMPRESSION: CTA neck: 1. The common carotid and internal carotid arteries are patent within the neck. Atherosclerotic plaque bilaterally. Most notably, there is a severe stenosis at the origin of the left ICA (likely greater than 80%). 2. Vertebral arteries patent within the neck. Sites of moderate/severe stenosis at the right vertebral artery origin, within the right V1 segment and at the origin of the left vertebral artery. 3. 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 4. Aortic Atherosclerosis (ICD10-I70.0). 5. 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 Consider a dedicated cervical spine MRI for further evaluation. CTA head: 1. No intracranial large vessel occlusion is identified. 2. Intracranial atherosclerotic disease with multifocal stenoses, most notably as follows. 3. Up to moderate stenosis within the paraclinoid left ICA. 4. Severe focal stenosis within a mid M2 right MCA vessel. 5. Sites of severe stenosis within the proximal V4 vertebral arteries, bilaterally. Electronically Signed   By: Kellie Simmering D.O.   On: 09/13/2022 18:31   CT HEAD CODE STROKE WO CONTRAST  Result Date:  09/13/2022 CLINICAL DATA:  Code stroke. Neuro deficit, acute, stroke suspected. Right-sided weakness. EXAM: CT HEAD WITHOUT CONTRAST TECHNIQUE: Contiguous axial images were obtained from the base of the skull through the vertex without intravenous contrast. RADIATION DOSE REDUCTION: This exam was performed according to the departmental dose-optimization program which includes automated exposure control, adjustment of the mA and/or kV according to patient size and/or use of iterative reconstruction technique. COMPARISON:  Head CT 09/07/2014 (images available, report unavailable). FINDINGS: Brain: Moderate cerebral atrophy. Small cortical/subcortical infarct within the mid to posterior left frontal lobe (within the left MCA/ACA watershed territory), new from the prior head CT of 09/07/2014, but otherwise age-indeterminate. Small chronic cortical/subcortical infarcts scattered elsewhere within the bilateral cerebral hemispheres. Background mild patchy and ill-defined hypoattenuation within the cerebral white matter, nonspecific but compatible with chronic small vessel disease. Chronic lacunar infarcts within bilateral basal ganglia. Small chronic infarcts within the bilateral cerebellar hemispheres. Vascular: No hyperdense vessel.  Atherosclerotic calcifications. Skull: No fracture or aggressive osseous lesion. Sinuses/Orbits: No mass or acute finding within the imaged orbits. Small mucous retention cysts within the right maxillary sinus. ASPECTS [facility] Stroke Program Early CT Score) - Ganglionic level infarction (caudate, lentiform nuclei, internal capsule, insula, M1-M3 cortex): 7 - Supraganglionic infarction (M4-M6 cortex): 2 Total score (0-10 with 10 being normal): 9 These results were communicated to Dr. Lorrin Goodell At 6:10 pmon 9/25/2023by text page via the [facility] messaging system. IMPRESSION: Small cortical/subcortical infarct within the mid-to-posterior left frontal lobe (left MCA/ACA watershed territory), new  from the prior head CT of 09/07/2014, but otherwise age-indeterminate. Background parenchymal atrophy, chronic small-vessel ischemic disease and chronic infarcts, as described. Electronically Signed   By: Kellie Simmering D.O.   On: 09/13/2022 18:10     Assessment and Plan:   Preoperative:  Ok to proceed with CEA Monday. Does all ADL's , 5 Mets and most recent cath with patent stent to OM and patent SVG to RCA and LIMA to LAD. No angina or CHF TTE this admission shows EF 50-55%  AICD:  not pacer dependant as far as I can tell  looking at [place] notes Have notified EP to make sure shocking Rx;s disabled for surgery since device is near left surgical CEA field PAF:  Anticoagulation has been with Eliquis He has been on primidone for years for tremors Interaction makes Eliquis concentration low and this may have had something to do with his stroke Ideally would stop primidone Discussed with pharmacy and would change Edoxoban 60 mg daily rather than restarting eliquis  Stroke:  Dr Scot Dock has seen Mixed results US/duplex with only 82-70% LICA velocities 7.86L/JQG systolic and 9.20 m/sec diastolic CTA suggests > 10% stenosis and stroke fits LICA distribution See caveat above regarding lack of proper anticoagulation   Jenkins Rouge MD [facility]  For questions or updates, please contact [place] Please consult [email or web address] for contact info under

## 2022-09-15 NOTE — Progress Notes (Signed)
SLP Cancellation Note  Patient Details Name: Alexander Austin MRN: [id number] DOB: 08-18-1946   Cancelled treatment:       Reason Eval/Treat Not Completed: Patient at procedure or test/unavailable (Pt off unit at this time. SLP will follow up.)  Shaheem Pichon I. Hardin Negus, [place], [place] Office number [phone]  Horton Marshall 09/15/2022, 10:02 AM

## 2022-09-15 NOTE — Plan of Care (Signed)
  Problem: Education: Goal: Knowledge of General Education information will improve Description: Including pain rating scale, medication(s)/side effects and non-pharmacologic comfort measures Outcome: Progressing   Problem: Health Behavior/Discharge Planning: Goal: Ability to manage health-related needs will improve Outcome: Progressing   Problem: Clinical Measurements: Goal: Ability to maintain clinical measurements within normal limits will improve Outcome: Progressing Goal: Will remain free from infection Outcome: Progressing Goal: Diagnostic test results will improve Outcome: Progressing Goal: Respiratory complications will improve Outcome: Progressing Goal: Cardiovascular complication will be avoided Outcome: Progressing   Problem: Activity: Goal: Risk for activity intolerance will decrease Outcome: Progressing   Problem: Nutrition: Goal: Adequate nutrition will be maintained Outcome: Progressing   Problem: Coping: Goal: Level of anxiety will decrease Outcome: Progressing   Problem: Elimination: Goal: Will not experience complications related to bowel motility Outcome: Progressing Goal: Will not experience complications related to urinary retention Outcome: Progressing   Problem: Pain Managment: Goal: General experience of comfort will improve Outcome: Progressing   Problem: Safety: Goal: Ability to remain free from injury will improve Outcome: Progressing   Problem: Skin Integrity: Goal: Risk for impaired skin integrity will decrease Outcome: Progressing   Problem: Education: Goal: Knowledge of disease or condition will improve Outcome: Progressing Goal: Knowledge of secondary prevention will improve (SELECT ALL) Outcome: Progressing

## 2022-09-15 NOTE — Progress Notes (Signed)
Patient is planned for left CEA EP is asked to comment on device management perioperatively. Appears to have a medtronic CRT-D by CXR, I can not find any other data Medtr[employer] rep is aware of the patient and will check the device today. Current device status is unknown. I have asked the rep to make [place] aware of current device status for pre-opertive recommendations.   Tommye Standard, PA-C

## 2022-09-15 NOTE — Progress Notes (Signed)
[facility] [place]  Subjective  - Awake with clear speech, no new complaints    Objective (!) 163/60 60 98.7 F (37.1 C) (Oral) 18 95%  Intake/Output Summary (Last 24 hours) at 09/15/2022 0708 Last data filed at 09/15/2022 0400 Gross per 24 hour  Intake 100 ml  Output 600 ml  Net -500 ml   Moving all 4 extremities, minimal right UE weakness Speech clear, alert and oriented Lungs non labored breathing Heart A fib  Assessment/Planning: Symptomatic left carotid stenosis with > 80% narrowing of the ICA  Pending cardiac clearance and carotid duplex Plan for CEA verses TCAR Eliquis on hold for surgical preporation.   He is receiving Plavix and statin daily without ASA.  Plans to restart anticoagulation post op.   Alexander Austin 09/15/2022 7:08 AM --  Laboratory Lab Results: Recent Labs    09/13/22 1750 09/13/22 1753  WBC 4.3  --   HGB 12.3* 11.6*  HCT 36.7* 34.0*  PLT 165  --    BMET Recent Labs    09/13/22 1753 09/14/22 0333  NA 142 142  K 4.9 3.2*  CL 108 111  CO2  --  26  GLUCOSE 121* 99  BUN 28* 21  CREATININE 1.60* 1.63*  CALCIUM  --  8.9    COAG Lab Results  Component Value Date   INR 1.5 (H) 09/13/2022   INR 1.1 02/12/2015   No results found for: "PTT"

## 2022-09-16 ENCOUNTER — Other Ambulatory Visit (HOSPITAL_COMMUNITY): Payer: Self-pay

## 2022-09-16 DIAGNOSIS — Z95810 Presence of automatic (implantable) cardiac defibrillator: Secondary | ICD-10-CM

## 2022-09-16 DIAGNOSIS — I480 Paroxysmal atrial fibrillation: Secondary | ICD-10-CM | POA: Diagnosis not present

## 2022-09-16 DIAGNOSIS — I6522 Occlusion and stenosis of left carotid artery: Secondary | ICD-10-CM

## 2022-09-16 DIAGNOSIS — I25708 Atherosclerosis of coronary artery bypass graft(s), unspecified, with other forms of angina pectoris: Secondary | ICD-10-CM | POA: Diagnosis not present

## 2022-09-16 DIAGNOSIS — Z01810 Encounter for preprocedural cardiovascular examination: Secondary | ICD-10-CM | POA: Diagnosis not present

## 2022-09-16 DIAGNOSIS — I6300 Cerebral infarction due to thrombosis of unspecified precerebral artery: Secondary | ICD-10-CM | POA: Diagnosis not present

## 2022-09-16 LAB — APTT: aPTT: 100 seconds — ABNORMAL HIGH (ref 24–36)

## 2022-09-16 MED ORDER — HEPARIN (PORCINE) 25000 UT/250ML-% IV SOLN
1350.0000 [IU]/h | INTRAVENOUS | Status: DC
  Administered 2022-09-16: 1500 [IU]/h via INTRAVENOUS
  Administered 2022-09-17: 1450 [IU]/h via INTRAVENOUS
  Administered 2022-09-17 – 2022-09-19 (×3): 1350 [IU]/h via INTRAVENOUS
  Filled 2022-09-16 (×6): qty 250

## 2022-09-16 NOTE — Progress Notes (Signed)
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  Patient takes Plavix and Eliquis at home. In the ED he was hypertensive blood pressure 160/64 Code stroke was called, CT head showed small cortical/subcortical infarct in the left frontal lobe that was new from 2015.  CTA head and neck showed no large vessel occlusion but had severe stenosis of the origin of left ICA greater than 80%. Patient was not a candidate for TNKase, since he is on Eliquis.  Neurology was consulted    Subjective   Patient seen and examined, no new complaints.  Seen by cardiology yesterday appreciate cardiology input.   Assessment/Plan:    CVA -CT head showed small cortical/subcortical infarct within the mid to posterior left frontal lobe, left MCA/ACA watershed territory, new from prior head CT of 09/07/2014 -CT head and neck showed severe stenosis of the origin of left ICA greater than 80%; carotid duplex obtained which shows 60 to 79% left ICA stenosis.   -vascular surgery consulted, called and discussed Dr. Doren Custard who saw the patient and plan for surgery on Monday 09-20-22. -Continue Lipitor 80 mg daily -MRI brain pending compatibility of his Medtronic/ICD pacemaker -Echocardiogram ordered -Follow hemoglobin A1c, lipid profile PT/OT/speech therapy -Permissive hypertension, treat for BP greater than 220/120 -Neurology has signed off  Internal carotid artery stenosis, left -Severe stenosis near the origin of left ICA likely more than 80% -Vascular surgery consulted for further recommendations -Plan for carotid endarterectomy on Monday  Chronic  kidney disease stage IIIa -Creatinine is stable  Hypertension -Home antihypertensives on hold for permissive hypertension -Okay for blood pressure less than 220/120 -We will gradually restart home medications  Paroxysmal atrial fibrillation -Apixaban on hold -Vascular surgery recommended to continue with Plavix for now and start Eliquis post op -Cardiology was consulted for preop evaluation -Found out that patient was on Eliquis and primidone and primidone interacts with Eliquis and makes it less effective; which may explain that patient had stroke while being on Eliquis -Eliquis has been discontinued, patient started on heparin per pharmacy by vascular surgery -Plan to start edoxaban and Plavix post op  CAD s/p CABG -Continue Plavix  Hypokalemia -Replete  Medications     atorvastatin  80 mg Oral Daily   calcitRIOL  0.25 mcg Oral Daily   clopidogrel  75 mg Oral Daily   colchicine  0.3 mg Oral BID   docusate sodium  100 mg Oral Daily   escitalopram  20 mg Oral QHS   febuxostat  80 mg Oral Daily   primidone  100 mg Oral BID     Data Reviewed:   CBG:  Recent Labs  Lab 09/13/22 1748  GLUCAP 122*    SpO2: 95 %    Vitals:   09/15/22 2020 09/15/22 2350 09/16/22 0405 09/16/22 0753  BP: (!) 174/74 (!) 197/82 (!) 174/68 (!) 194/60  Pulse: 66 66 (!) 59 (!) 59  Resp: '19 19 20 18  '$ Temp: 98.2 F (36.8 C) 97.7 F (36.5 C) 97.6 F (36.4 C) 98.2 F (36.8 C)  TempSrc: Oral Oral Oral Oral  SpO2: 97% 95% 94% 95%  Weight:      Height:  Data Reviewed:  Basic Metabolic Panel: Recent Labs  Lab 09/13/22 1753 09/14/22 0333 09/15/22 0705  NA 142 142 141  K 4.9 3.2* 3.8  CL 108 111 108  CO2  --  26 24  GLUCOSE 121* 99 88  BUN 28* 21 22  CREATININE 1.60* 1.63* 1.47*  CALCIUM  --  8.9 9.0    CBC: Recent Labs  Lab 09/13/22 1750 09/13/22 1753 09/15/22 0705  WBC 4.3  --  4.0  NEUTROABS 3.3  --   --   HGB 12.3* 11.6* 11.5*  HCT 36.7* 34.0* 33.6*   MCV 90.8  --  90.8  PLT 165  --  146*    LFT Recent Labs  Lab 09/14/22 0333  AST 16  ALT 16  ALKPHOS 83  BILITOT 0.9  PROT 5.9*  ALBUMIN 3.3*     Antibiotics: Anti-infectives (From admission, onward)    None        DVT prophylaxis: Started on heparin per pharmacy  Code Status: Partial code, no intubation mechanical ventilation  Family Communication: No family at bedside   CONSULTS neurology   Objective    Physical Examination:  General-appears in no acute distress Heart-S1-S2, regular, no murmur auscultated Lungs-clear to auscultation bilaterally, no wheezing or crackles auscultated Abdomen-soft, nontender, no organomegaly Extremities-no edema in the lower extremities Neuro-alert, oriented x3, no focal deficit noted   Status is: Inpatient:         Oswald Hillock   [facility] If 7PM-7AM, please contact night-coverage at [email or web address], Office  [phone]   09/16/2022, 10:09 AM  LOS: 2 days

## 2022-09-16 NOTE — Progress Notes (Signed)
ANTICOAGULATION CONSULT NOTE - Initial Consult  Pharmacy Consult for heparin Indication: atrial fibrillation  Allergies  Allergen Reactions   Isosorbide Nitrate Anaphylaxis    Other reaction(s): Cardiovascular Arrest (ALLERGY/intolerance) Can take Sublingual Nitro.    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    Patient Measurements: Height: '6\' 1"'$  (185.4 cm) Weight: 112.4 kg (247 lb 12.8 oz) IBW/kg (Calculated) : 79.9 Heparin Dosing Weight: 103kg  Vital Signs: Temp: 98.2 F (36.8 C) (09/28 0753) Temp Source: Oral (09/28 0753) BP: 194/60 (09/28 0753) Pulse Rate: 59 (09/28 0753)  Labs: Recent Labs    09/13/22 1750 09/13/22 1753 09/14/22 0333 09/15/22 0705  HGB 12.3* 11.6*  --  11.5*  HCT 36.7* 34.0*  --  33.6*  PLT 165  --   --  146*  APTT 35  --   --   --   LABPROT 18.3*  --   --   --   INR 1.5*  --   --   --   CREATININE  --  1.60* 1.63* 1.47*    Estimated Creatinine Clearance: 55.3 mL/min (A) (by C-G formula based on SCr of 1.47 mg/dL (H)).   Medical History: Past Medical History:  Diagnosis Date   A-fib [facility])    Anxiety    Benign prostate hyperplasia    Cancer ([place])    Kidney   Chronic kidney disease    Congestive heart failure (CHF) (HCC)    CVA (cerebral infarction)    Depression    Dyspnea    GERD (gastroesophageal reflux disease)    Gout    Hematospermia 01/13/2012   History of myocardial infarction 1993   Hyperlipidemia    Hypertension    Sleep apnea    Stroke ([place])    Vitamin B 12 deficiency    Weakness of left leg 03/28/2020    Assessment: 76 yo M on apixaban PTA for afib now held for planned L carotid endarterectomy 10/2. Last dose apixaban 9/27 at 22:00. Pharmacy consulted for heparin. After procedure, plan to switch to edoxaban due to drug interaction with primidone. Patient fills medications through the [place].   Goal of Therapy:  Heparin level 0.3- 0.7 units/ml APTT 66-102  sec Monitor platelets by anticoagulation protocol: Yes   Plan:  Heparin 1500 units/hr, no bolus F/u aPTT until correlates with heparin level  F/u APTT in 8 hrs Monitor daily heparin level, CBC Monitor for signs/symptoms of bleeding    Benetta Spar, PharmD, BCPS, BCCP Clinical Pharmacist  Please check AMION for all [place] phone numbers After 10:00 PM, call [place]

## 2022-09-16 NOTE — Evaluation (Signed)
Speech Language Pathology Evaluation Patient Details Name: Alexander Austin MRN: [id number] DOB: 1946/04/23 Today's Date: 09/16/2022 Time: 1030-1051 SLP Time Calculation (min) (ACUTE ONLY): 21 min  Problem List:  Patient Active Problem List   Diagnosis Date Noted   AICD (automatic cardioverter/defibrillator) present    Preoperative cardiovascular examination    CVA (cerebral vascular accident) ([place]) 09/13/2022   Internal carotid artery stenosis, left 09/13/2022   Skin wound from surgical incision 09/13/2022   Gout 07/03/2020   Gout flare 05/21/2020   Chronic diastolic CHF (congestive heart failure) ([place]) 03/28/2020   Acute gout 03/07/2020   Chronic kidney disease, stage 3a ([place]) 03/07/2020   Obesity (BMI 30.0-34.9) 03/07/2020   Tremor 04/16/2015   BPH (benign prostatic hyperplasia) 04/16/2015   Chronic anticoagulation 04/16/2015   Edema 04/16/2015   Urinary retention 03/06/2015   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 ([place]) 03/06/2015   NSTEMI (non-ST elevated myocardial infarction) ([place]) 03/06/2015   PAF (paroxysmal atrial fibrillation) ([place]) 03/06/2015   History of renal cell cancer 03/06/2015   Vitamin B12 deficiency 03/06/2015   Major depressive disorder, recurrent, in remission ([place]) 03/06/2015   History of stroke 03/06/2015   GERD (gastroesophageal reflux disease) 03/06/2015   Essential hypertension, benign 03/06/2015   Gout, arthritis 03/06/2015   Past Medical History:  Past Medical History:  Diagnosis Date   A-fib [facility])    Anxiety    Benign prostate hyperplasia    Cancer ([place])    Kidney   Chronic kidney disease    Congestive heart failure (CHF) ([place])    CVA (cerebral infarction)    Depression    Dyspnea    GERD (gastroesophageal reflux disease)    Gout    Hematospermia 01/13/2012   History of myocardial infarction 1993   Hyperlipidemia    Hypertension    Sleep apnea    Stroke ([place])     Vitamin B 12 deficiency    Weakness of left leg 03/28/2020   Past Surgical History:  Past Surgical History:  Procedure Laterality Date   ANGIOPLASTY     X 8    BACK SURGERY     CHOLECYSTECTOMY     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   SAPHENOUS VEIN GRAFT RESECTION  02/05/15   HPI:  Alexander Austin is a 76 y.o. male with history of atrial fibrillation, GERD, HLD, HTN, OSA and CHF presenting with  sudden onset right sided weakness and numbness with difficulty ambulating.  He is taking Eliquis for atrial fibrillation and reports that he has not missed any doses of this medication.  MRI head 09/15/22 indicated There is a small acute to early subacute cortical and  subcortical infarct in the left frontoparietal region. Multiple  chronic cortical and subcortical infarcts are noted in the left  greater than right frontal and parietal lobes and in the left  occipital lobe. There are also small chronic bilateral cerebellar  infarcts; SLE generated to assess speech/language/cognition.   Assessment / Plan / Recommendation Clinical Impression  Pt seen for speech/language cognitive assessment via the [place] Mental Status Examination (SLUMS) with a score obtained of 19/26 with baseline cognitive deficits in memory recall noted by pt as he had a prior CVA in past.  Pt demonstrated deficits on this assessment in areas of attention/memory recall with tasks such as repeating digits backwards past 3 digits accurately, completing simple calculation task and 75% accuracy achieved with paragraph retention.  Pt  recalled 3/5 objects after a time delay without cueing and 5/5 with categorization cue provided.  Pt stated he has an aide 5 days/week for several hours a day to assist with ADLs.  Pt appeared aware of current deficits, but f/u at next venue of care may be beneficial to assure new needs are identified within area of cognition/language.  ST will s/o in acute setting.  Thank you for this consult.    SLP Assessment  SLP Recommendation/Assessment: All further Speech Language Pathology  needs can be addressed in the next venue of care SLP Visit Diagnosis: Cognitive communication deficit (R41.841)    Recommendations for follow up therapy are one component of a multi-disciplinary discharge planning process, led by the attending physician.  Recommendations may be updated based on patient status, additional functional criteria and insurance authorization.    Follow Up Recommendations  Follow physician's recommendations for discharge plan and follow up therapies (may consider [place] SLP prn)    Assistance Recommended at Discharge  Set up Supervision/Assistance  Functional Status Assessment Patient has had a recent decline in their functional status and demonstrates the ability to make significant improvements in function in a reasonable and predictable amount of time.  Frequency and Duration  (evaluation only)         SLP Evaluation Cognition  Overall Cognitive Status: History of cognitive impairments - at baseline Arousal/Alertness: Awake/alert Orientation Level: Oriented X4 Attention: Sustained Sustained Attention: Impaired Sustained Attention Impairment: Verbal basic;Functional basic Memory: Impaired Memory Impairment: Retrieval deficit;Decreased recall of new information;Decreased short term memory Decreased Short Term Memory: Verbal basic;Functional basic Awareness: Appears intact Problem Solving: Appears intact Safety/Judgment: Appears intact       Comprehension  Auditory Comprehension Overall Auditory Comprehension: Appears within functional limits for tasks assessed Yes/No Questions: Within Functional Limits Commands: Within Functional Limits Conversation: Simple Interfering Components: Working Field seismologist: Repetition Retail banker: Within Function Limits Reading Comprehension Reading Status: Within funtional limits     Expression Expression Primary Mode of Expression: Verbal Verbal Expression Overall Verbal Expression: Impaired at baseline (d/t memory deficits; occasional anomia) Level of Generative/Spontaneous Verbalization: Conversation Naming: Not tested Pragmatics: No impairment Interfering Components: Attention Non-Verbal Means of Communication: Not applicable Written Expression Dominant Hand: Right Written Expression: Not tested (pt denotes R sensory impairment/fine motor deficit from prior CVA; "I can't write")   Oral / Motor  Oral Motor/Sensory Function Overall Oral Motor/Sensory Function: Within functional limits Motor Speech Overall Motor Speech: Appears within functional limits for tasks assessed Respiration: Within functional limits Phonation: Normal Resonance: Within functional limits Articulation: Within functional limitis Intelligibility: Intelligible Motor Planning: Witnin functional limits Motor Speech Errors: Not applicable            Elvina Sidle, M.S., CCC-SLP 09/16/2022, 12:02 PM

## 2022-09-16 NOTE — Progress Notes (Signed)
[employer] rep checked device yesterday with a verbal report Device function is intact Patient is not device dependent He will need peri-operative device management as per protocol or surgeon's preference. Rep management with programming pre-post op  Vs Magnet application  Alexander Austin Standard, [place]

## 2022-09-16 NOTE — Progress Notes (Signed)
ANTICOAGULATION CONSULT NOTE - Initial Consult  Pharmacy Consult for heparin Indication: atrial fibrillation  Allergies  Allergen Reactions   Isosorbide Nitrate Anaphylaxis    Other reaction(s): Cardiovascular Arrest (ALLERGY/intolerance) Can take Sublingual Nitro.    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    Patient Measurements: Height: '6\' 1"'$  (185.4 cm) Weight: 112.4 kg (247 lb 12.8 oz) IBW/kg (Calculated) : 79.9 Heparin Dosing Weight: 103kg  Vital Signs: Temp: 98.5 F (36.9 C) (09/28 1500) Temp Source: Oral (09/28 1500) BP: 163/62 (09/28 1500) Pulse Rate: 78 (09/28 1500)  Labs: Recent Labs    09/14/22 0333 09/15/22 0705 09/16/22 1751  HGB  --  11.5*  --   HCT  --  33.6*  --   PLT  --  146*  --   APTT  --   --  100*  CREATININE 1.63* 1.47*  --      Estimated Creatinine Clearance: 55.3 mL/min (A) (by C-G formula based on SCr of 1.47 mg/dL (H)).   Medical History: Past Medical History:  Diagnosis Date   A-fib [facility])    Anxiety    Benign prostate hyperplasia    Cancer ([place])    Kidney   Chronic kidney disease    Congestive heart failure (CHF) (HCC)    CVA (cerebral infarction)    Depression    Dyspnea    GERD (gastroesophageal reflux disease)    Gout    Hematospermia 01/13/2012   History of myocardial infarction 1993   Hyperlipidemia    Hypertension    Sleep apnea    Stroke ([place])    Vitamin B 12 deficiency    Weakness of left leg 03/28/2020    Assessment: 76 yo M on apixaban PTA for afib now held for planned L carotid endarterectomy 10/2. Last dose apixaban 9/27 at 22:00. Pharmacy consulted for heparin. After procedure, plan to switch to edoxaban due to drug interaction with primidone. Patient fills medications through the [place].  aPTT level at upper range of therapeutic at 100.     Goal of Therapy:  Heparin level 0.3- 0.7 units/ml APTT 66-102 sec Monitor platelets by anticoagulation  protocol: Yes   Plan:  Will decrease heparin to 1450 due to aPTT being in upper range of therapeutic in the setting of recent stroke.  F/u aPTT until correlates with heparin level  F/u APTT in 8 hrs Monitor daily heparin level, CBC Monitor for signs/symptoms of bleeding   Esmeralda Arthur, PharmD  Clinical Pharmacist  Please check AMION for all [place] phone numbers After 10:00 PM, call [place]

## 2022-09-16 NOTE — Progress Notes (Signed)
   VASCULAR SURGERY ASSESSMENT & PLAN:   SYMPTOMATIC LEFT CAROTID STENOSIS: The patient has been cleared by Dr. Johnsie Cancel.  He is scheduled for a left carotid endarterectomy on Monday.  This will be technically challenging given his limited neck mobility however he is not an ideal candidate for TCAR given that he is on anticoagulation for his A-fib.  He needs to be on aspirin and Plavix for 1 month after TCAR.   ANTICOAGULATION: I have asked pharmacy to start heparin and stop his Eliquis in anticipation of surgery.  As per Dr. Kyla Balzarine note yesterday, the patient is on primidone for tremors which can have an effect on the Eliquis concentration.  He recommended Edoxoban 60 mg daily, rather than Eliquis, once he restarts anticoagulation postoperatively.   SUBJECTIVE:   No complaints this morning.  PHYSICAL EXAM:   Vitals:   09/15/22 2020 09/15/22 2350 09/16/22 0405 09/16/22 0753  BP: (!) 174/74 (!) 197/82 (!) 174/68 (!) 194/60  Pulse: 66 66 (!) 59 (!) 59  Resp: '19 19 20 18  '$ Temp: 98.2 F (36.8 C) 97.7 F (36.5 C) 97.6 F (36.4 C) 98.2 F (36.8 C)  TempSrc: Oral Oral Oral Oral  SpO2: 97% 95% 94% 95%  Weight:      Height:       No change in exam.  He has some mild weakness to grip on the right hand.  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 (HCC)   PAF (paroxysmal atrial fibrillation) (HCC)   Essential hypertension, benign   Chronic kidney disease, stage  3a (HCC)   Obesity (BMI 30.0-34.9)   Internal carotid artery stenosis, left   Skin wound from surgical incision   AICD (automatic cardioverter/defibrillator) present   Preoperative cardiovascular examination   CURRENT MEDS:    apixaban  5 mg Oral BID   atorvastatin  80 mg Oral Daily   calcitRIOL  0.25 mcg Oral Daily   clopidogrel  75 mg Oral Daily   colchicine  0.3 mg Oral BID   docusate sodium  100 mg Oral Daily   escitalopram  20 mg Oral QHS   febuxostat  80 mg Oral Daily   primidone  100 mg Oral BID    Deitra Mayo Office: [phone] 09/16/2022

## 2022-09-16 NOTE — Plan of Care (Signed)
  Problem: Education: Goal: Knowledge of General Education information will improve Description: Including pain rating scale, medication(s)/side effects and non-pharmacologic comfort measures Outcome: Progressing   Problem: Health Behavior/Discharge Planning: Goal: Ability to manage health-related needs will improve Outcome: Progressing   Problem: Clinical Measurements: Goal: Ability to maintain clinical measurements within normal limits will improve Outcome: Progressing Goal: Will remain free from infection Outcome: Progressing Goal: Diagnostic test results will improve Outcome: Progressing Goal: Respiratory complications will improve Outcome: Progressing   Problem: Activity: Goal: Risk for activity intolerance will decrease Outcome: Progressing   Problem: Nutrition: Goal: Adequate nutrition will be maintained Outcome: Progressing   Problem: Coping: Goal: Level of anxiety will decrease Outcome: Progressing   Problem: Elimination: Goal: Will not experience complications related to bowel motility Outcome: Progressing Goal: Will not experience complications related to urinary retention Outcome: Progressing   Problem: Pain Managment: Goal: General experience of comfort will improve Outcome: Progressing   Problem: Safety: Goal: Ability to remain free from injury will improve Outcome: Progressing   Problem: Skin Integrity: Goal: Risk for impaired skin integrity will decrease Outcome: Progressing   Problem: Education: Goal: Knowledge of disease or condition will improve Outcome: Progressing Goal: Knowledge of secondary prevention will improve (SELECT ALL) Outcome: Progressing

## 2022-09-16 NOTE — Progress Notes (Signed)
Mobility Specialist: Progress Note   09/16/22 1031  Mobility  Activity Ambulated with assistance in hallway  Level of Assistance Minimal assist, patient does 75% or more  Assistive Device Four wheel walker  Distance Ambulated (ft) 250 ft  Activity Response Tolerated well  $Mobility charge 1 Mobility   Received pt in bed having no complaints and agreeable to mobility. To BSC, then agreeable to mobility. Pt was asymptomatic throughout ambulation and returned to room w/o fault. Requesting to use BSC after returning to the room. Pt assisted back to bed w/ call bell in reach and all needs met.  [facility] Rhealyn Cullen Mobility Specialist Mobility Specialist [address]: [phone]

## 2022-09-16 NOTE — Progress Notes (Signed)
[facility] Cardiology:   [facility] Subjective:  Denies SSCP, palpitations or Dyspnea   Objective:  Vitals:   09/15/22 2020 09/15/22 2350 09/16/22 0405 09/16/22 0753  BP: (!) 174/74 (!) 197/82 (!) 174/68 (!) 194/60  Pulse: 66 66 (!) 59 (!) 59  Resp: '19 19 20 18  '$ Temp: 98.2 F (36.8 C) 97.7 F (36.5 C) 97.6 F (36.4 C) 98.2 F (36.8 C)  TempSrc: Oral Oral Oral Oral  SpO2: 97% 95% 94% 95%  Weight:      Height:        Intake/Output from previous day:  Intake/Output Summary (Last 24 hours) at 09/16/2022 0847 Last data filed at 09/16/2022 0411 Gross per 24 hour  Intake 150 ml  Output 900 ml  Net -750 ml    Physical Exam: Overweight white male Left bruit Left [employer] Bi V AICD Abdomen bening No edema Mild dysarthria and RLE weakness improving   Lab Results: Basic Metabolic Panel: Recent Labs    09/14/22 0333 09/15/22 0705  NA 142 141  K 3.2* 3.8  CL 111 108  CO2 26 24  GLUCOSE 99 88  BUN 21 22  CREATININE 1.63* 1.47*  CALCIUM 8.9 9.0   Liver Function Tests: Recent Labs    09/14/22 0333  AST 16  ALT 16  ALKPHOS 83  BILITOT 0.9  PROT 5.9*  ALBUMIN 3.3*   No results for input(s): "LIPASE", "AMYLASE" in the last 72 hours. CBC: Recent Labs    09/13/22 1750 09/13/22 1753 09/15/22 0705  WBC 4.3  --  4.0  NEUTROABS 3.3  --   --   HGB 12.3* 11.6* 11.5*  HCT 36.7* 34.0* 33.6*  MCV 90.8  --  90.8  PLT 165  --  146*    Hemoglobin A1C: Recent Labs    09/14/22 0333  HGBA1C 4.7*   Fasting Lipid Panel: Recent Labs    09/14/22 0333  CHOL 95  HDL 31*  LDLCALC 49  TRIG 75  CHOLHDL 3.1     Imaging: VAS US CAROTID  Result Date: 09/15/2022 Carotid Arterial Duplex Study Patient Name:  NAITHEN RIVENBURG  Date of Exam:   09/15/2022 Medical Rec #: [id number]     Accession #:    [id number] Date of Birth: 30-Nov-1946     Patient Gender: M Patient Age:   76 years Exam Location:  [facility] Procedure:      VAS US CAROTID Referring Phys: Frederich Chick LAMA  --------------------------------------------------------------------------------  Indications:       CVA and Sudden onset right sided weakness and numbness with                    difficulty ambulating. CTA neck showed severe stenosis at the                    origin of the left ICA (likely greater than 80%). Risk Factors:      Hypertension, hyperlipidemia, past history of smoking. Other Factors:     Atrial fibrillation. Comparison Study:  09/13/2022- CTA neck Performing Technologist: Maudry Mayhew MHA, RDMS, RVT, RDCS  Examination Guidelines: A complete evaluation includes B-mode imaging, spectral Doppler, color Doppler, and power Doppler as needed of all accessible portions of each vessel. Bilateral testing is considered an integral part of a complete examination. Limited examinations for reoccurring indications may be performed as noted.  Right Carotid Findings: +----------+--------+-------+--------+----------------------+------------------+           PSV cm/sEDV    StenosisPlaque Description  Comments                             cm/s                                                    +----------+--------+-------+--------+----------------------+------------------+ CCA Prox  109     10                                                      +----------+--------+-------+--------+----------------------+------------------+ CCA Distal93      9                                    intimal thickening +----------+--------+-------+--------+----------------------+------------------+ ICA Prox  131     24             smooth and                                                                heterogenous                             +----------+--------+-------+--------+----------------------+------------------+ ICA Distal158     29                                                      +----------+--------+-------+--------+----------------------+------------------+ ECA       89                                            intimal thickening +----------+--------+-------+--------+----------------------+------------------+ +----------+--------+-------+----------------+-------------------+           PSV cm/sEDV cmsDescribe        Arm Pressure (mmHG) +----------+--------+-------+----------------+-------------------+ [id number]             Multiphasic, WNL                    +----------+--------+-------+----------------+-------------------+ +---------+--------+--+--------+--+---------+ VertebralPSV cm/s31EDV cm/s10Antegrade +---------+--------+--+--------+--+---------+  Left Carotid Findings: +----------+--------+--------+--------+------------------------------+---------+           PSV cm/sEDV cm/sStenosisPlaque Description            Comments  +----------+--------+--------+--------+------------------------------+---------+ CCA Prox  100     9                                                       +----------+--------+--------+--------+------------------------------+---------+ CCA Distal71      9  focal and calcific                      +----------+--------+--------+--------+------------------------------+---------+ ICA Prox  232     35              heterogenous, irregular and   Shadowing                                   calcific                                +----------+--------+--------+--------+------------------------------+---------+ ICA Mid   170     35                                                      +----------+--------+--------+--------+------------------------------+---------+ ICA Distal116     20                                                      +----------+--------+--------+--------+------------------------------+---------+ ECA       246                     heterogenous, irregular and   shadowing                                   calcific                                 +----------+--------+--------+--------+------------------------------+---------+ +----------+--------+--------+----------------+-------------------+           PSV cm/sEDV cm/sDescribe        Arm Pressure (mmHG) +----------+--------+--------+----------------+-------------------+ Subclavian117             Multiphasic, WNL                    +----------+--------+--------+----------------+-------------------+ +---------+--------+--+--------+--+---------+ VertebralPSV cm/s64EDV cm/s12Antegrade +---------+--------+--+--------+--+---------+   Summary: Right Carotid: Velocities in the right ICA are consistent with a 1-39% stenosis. Left Carotid: Velocities in the left ICA are consistent with an upper range               1-39% stenosis by end diastolic velocity, and 66-44% stenosis by               peak systolic velocity. Vertebrals:  Bilateral vertebral arteries demonstrate antegrade flow. Subclavians: Normal flow hemodynamics were seen in bilateral subclavian              arteries. *See table(s) above for measurements and observations.  Electronically signed by Antony Contras MD on 09/15/2022 at 2:33:21 PM.    Final    MR BRAIN WO CONTRAST  Result Date: 09/15/2022 CLINICAL DATA:  Stroke, follow-up.  Right-sided weakness. EXAM: MRI HEAD WITHOUT CONTRAST TECHNIQUE: Multiplanar, multiecho pulse sequences of the brain and surrounding structures were obtained without intravenous contrast. COMPARISON:  Head CT and CTA 09/13/2022 FINDINGS: Brain: There is a small acute to early subacute cortical and subcortical infarct in the left  frontoparietal region. Multiple chronic cortical and subcortical infarcts are noted in the left greater than right frontal and parietal lobes and in the left occipital lobe. There are also small chronic bilateral cerebellar infarcts. There are a few scattered chronic cerebral and cerebellar microhemorrhages. Mild-to-moderate T2 hyperintensities elsewhere in the cerebral white matter  bilaterally are nonspecific but compatible with chronic small vessel ischemic disease. T2 hyperintensities in the basal ganglia bilaterally reflect a combination of dilated perivascular spaces and chronic lacunar infarcts. Mild chronic small vessel changes are also present in the thalami and pons. No mass, midline shift, or extra-axial fluid collection is identified. There is moderate cerebral atrophy. Vascular: Major intracranial vascular flow voids are preserved. Skull and upper cervical spine: Unremarkable bone marrow signal. Sinuses/Orbits: Unremarkable orbits. Paranasal sinuses and mastoid air cells are clear. Other: None. IMPRESSION: 1. Small acute to early subacute left frontoparietal infarct. 2. Chronic ischemia with multiple chronic infarcts as above. Electronically Signed   By: Logan Bores M.D.   On: 09/15/2022 11:47   ECHOCARDIOGRAM COMPLETE  Result Date: 09/14/2022    ECHOCARDIOGRAM REPORT   Patient Name:   CHIMA ASTORINO Date of Exam: 09/14/2022 Medical Rec #:  [id number]    Height:       73.0 in Accession #:    [id number]   Weight:       247.8 lb Date of Birth:  Apr 21, 1946    BSA:          2.357 m Patient Age:    39 years     BP:           155/72 mmHg Patient Gender: M            HR:           63 bpm. Exam Location:  Inpatient Procedure: 2D Echo, Cardiac Doppler, Color Doppler and Intracardiac            Opacification Agent Indications:    Stroke  History:        Patient has no prior history of Echocardiogram examinations. CAD                 and NSTEMI, CVA, Pulmonary HTN and Carotid Disease,                 Arrythmias:Atrial Fibrillation, Signs/Symptoms:Edema; Risk                 Factors:Hypertension and Current Smoker.  Sonographer:    Greer Pickerel Sonographer#2:  Raquel Sarna Senior Referring Phys: [id number] [place] T TU  Sonographer Comments: Technically difficult study due to poor echo windows, suboptimal parasternal window, suboptimal apical window, no subcostal window and patient is obese. Image  acquisition challenging due to respiratory motion. IMPRESSIONS  1. Left ventricular ejection fraction, by estimation, is 50 to 55%. The left ventricle has low normal function. The left ventricle has no regional wall motion abnormalities. Left ventricular diastolic parameters are consistent with Grade II diastolic dysfunction (pseudonormalization).  2. Right ventricular systolic function is normal. The right ventricular size is normal. There is normal pulmonary artery systolic pressure.  3. Left atrial size was severely dilated.  4. The mitral valve is grossly normal. No evidence of mitral valve regurgitation. No evidence of mitral stenosis.  5. The aortic valve was not well visualized. Aortic valve regurgitation is trivial. Comparison(s): No significant change from prior study. Prior study not done with contrast. FINDINGS  Left Ventricle: Left ventricular ejection fraction, by estimation, is 50 to 55%. The left ventricle  has low normal function. The left ventricle has no regional wall motion abnormalities. Definity contrast agent was given IV to delineate the left ventricular endocardial borders. The left ventricular internal cavity size was normal in size. Suboptimal image quality limits for assessment of left ventricular hypertrophy. Left ventricular diastolic parameters are consistent with Grade II diastolic dysfunction (pseudonormalization). Right Ventricle: The right ventricular size is normal. No increase in right ventricular wall thickness. Right ventricular systolic function is normal. There is normal pulmonary artery systolic pressure. The tricuspid regurgitant velocity is 2.24 m/s, and  with an assumed right atrial pressure of 8 mmHg, the estimated right ventricular systolic pressure is 42.5 mmHg. Left Atrium: Left atrial size was severely dilated. Right Atrium: Right atrial size was normal in size. Pericardium: There is no evidence of pericardial effusion. Mitral Valve: The mitral valve is grossly normal.  No evidence of mitral valve regurgitation. No evidence of mitral valve stenosis. Tricuspid Valve: The tricuspid valve is grossly normal. Tricuspid valve regurgitation is not demonstrated. No evidence of tricuspid stenosis. Aortic Valve: The aortic valve was not well visualized. Aortic valve regurgitation is trivial. Pulmonic Valve: The pulmonic valve was normal in structure. Pulmonic valve regurgitation is mild. No evidence of pulmonic stenosis. Aorta: The aortic root is normal in size and structure. IAS/Shunts: No atrial level shunt detected by color flow Doppler.  LEFT VENTRICLE PLAX 2D LVOT diam:     2.10 cm      Diastology LV SV:         73           LV e' medial:    5.93 cm/s LV SV Index:   31           LV E/e' medial:  15.6 LVOT Area:     3.46 cm     LV e' lateral:   6.15 cm/s                             LV E/e' lateral: 15.0  LV Volumes (MOD) LV vol d, MOD A4C: 198.0 ml LV vol s, MOD A4C: 96.2 ml LV SV MOD A4C:     198.0 ml RIGHT VENTRICLE RV S prime:     5.40 cm/s TAPSE (M-mode): 2.0 cm LEFT ATRIUM              Index        RIGHT ATRIUM           Index LA Vol (A2C):   113.0 ml 47.95 ml/m  RA Area:     22.90 cm LA Vol (A4C):   150.0 ml 63.65 ml/m  RA Volume:   64.00 ml  27.16 ml/m LA Biplane Vol: 145.0 ml 61.52 ml/m  AORTIC VALVE             PULMONIC VALVE LVOT Vmax:   91.10 cm/s  PR End Diast Vel: 2.84 msec LVOT Vmean:  68.100 cm/s LVOT VTI:    0.212 m  AORTA Ao Root diam: 4.10 cm Ao Asc diam:  3.50 cm MITRAL VALVE               TRICUSPID VALVE MV Area (PHT): 3.66 cm    TR Peak grad:   20.1 mmHg MV Decel Time: 207 msec    TR Vmax:        224.00 cm/s MV E velocity: 92.30 cm/s MV A velocity: 72.40 cm/s  SHUNTS MV E/A ratio:  1.27  Systemic VTI:  0.21 m                            Systemic Diam: 2.10 cm Rudean Haskell MD Electronically signed by Rudean Haskell MD Signature Date/Time: 09/14/2022/2:10:45 PM    Final     Cardiac Studies:  ECG: Bi V pacing ? Underlying afib    Telemetry:    Echo: EF 50-55%   Medications:    atorvastatin  80 mg Oral Daily   calcitRIOL  0.25 mcg Oral Daily   clopidogrel  75 mg Oral Daily   colchicine  0.3 mg Oral BID   docusate sodium  100 mg Oral Daily   escitalopram  20 mg Oral QHS   febuxostat  80 mg Oral Daily   primidone  100 mg Oral BID      Assessment/Plan:  Preoperative:  Ok to proceed with CEA Monday. Does all ADL's , 5 Mets and most recent cath with patent stent to OM and patent SVG to RCA and LIMA to LAD. No angina or CHF TTE this admission shows EF 50-55%  AICD:  not pacer dependant as far as I can tell [facility] Have notified EP to make sure shocking Rx;s disabled for surgery since device is near left surgical CEA field Tommye Standard has seen normal device function and management per Anesthesia protocol He is not device dependant  PAF:  Anticoagulation has been with Eliquis He has been on primidone for years for tremors Interaction makes Eliquis concentration low and this may have had something to do with his stroke Ideally would stop primidone Discussed with pharmacy and would change Edoxoban 60 mg daily rather than restarting eliquis Patient thinks he is going home Not ideal for anticoagulation management unless he is on lovenox/plavix Post op will need ? Plavix and Edoxoban Stroke:  Dr Scot Dock has seen Mixed results US/duplex with only 46-56% LICA velocities 8.12X/NTZ systolic and 0.01 m/sec diastolic CTA suggests > 74% stenosis and stroke fits LICA distribution See caveat above regarding lack of proper anticoagulation   Jenkins Rouge 09/16/2022, 8:47 AM

## 2022-09-17 DIAGNOSIS — I25708 Atherosclerosis of coronary artery bypass graft(s), unspecified, with other forms of angina pectoris: Secondary | ICD-10-CM | POA: Diagnosis not present

## 2022-09-17 DIAGNOSIS — I480 Paroxysmal atrial fibrillation: Secondary | ICD-10-CM | POA: Diagnosis not present

## 2022-09-17 DIAGNOSIS — N1831 Chronic kidney disease, stage 3a: Secondary | ICD-10-CM | POA: Diagnosis not present

## 2022-09-17 DIAGNOSIS — I6300 Cerebral infarction due to thrombosis of unspecified precerebral artery: Secondary | ICD-10-CM | POA: Diagnosis not present

## 2022-09-17 DIAGNOSIS — Z95810 Presence of automatic (implantable) cardiac defibrillator: Secondary | ICD-10-CM | POA: Diagnosis not present

## 2022-09-17 DIAGNOSIS — Z01810 Encounter for preprocedural cardiovascular examination: Secondary | ICD-10-CM | POA: Diagnosis not present

## 2022-09-17 DIAGNOSIS — I639 Cerebral infarction, unspecified: Secondary | ICD-10-CM | POA: Diagnosis not present

## 2022-09-17 LAB — CBC
HCT: 38.4 % — ABNORMAL LOW (ref 39.0–52.0)
Hemoglobin: 13.3 g/dL (ref 13.0–17.0)
MCH: 30.9 pg (ref 26.0–34.0)
MCHC: 34.6 g/dL (ref 30.0–36.0)
MCV: 89.1 fL (ref 80.0–100.0)
Platelets: 190 10*3/uL (ref 150–400)
RBC: 4.31 MIL/uL (ref 4.22–5.81)
RDW: 14.5 % (ref 11.5–15.5)
WBC: 5.3 10*3/uL (ref 4.0–10.5)
nRBC: 0 % (ref 0.0–0.2)

## 2022-09-17 LAB — HEPARIN LEVEL (UNFRACTIONATED): Heparin Unfractionated: 0.97 IU/mL — ABNORMAL HIGH (ref 0.30–0.70)

## 2022-09-17 LAB — APTT
aPTT: 104 seconds — ABNORMAL HIGH (ref 24–36)
aPTT: 74 seconds — ABNORMAL HIGH (ref 24–36)

## 2022-09-17 MED ORDER — CARVEDILOL 12.5 MG PO TABS
12.5000 mg | ORAL_TABLET | Freq: Two times a day (BID) | ORAL | Status: DC
  Administered 2022-09-17 – 2022-09-19 (×4): 12.5 mg via ORAL
  Filled 2022-09-17 (×5): qty 1

## 2022-09-17 NOTE — Progress Notes (Signed)
Mobility Specialist: Progress Note   09/17/22 1238  Mobility  Activity Ambulated with assistance in hallway  Level of Assistance Minimal assist, patient does 75% or more  Assistive Device Four wheel walker  Distance Ambulated (ft) 290 ft  Activity Response Tolerated well  $Mobility charge 1 Mobility   During Mobility: 82 HR, 93% SpO2  Received pt in bed having no complaints and agreeable to mobility. Pt was asymptomatic throughout ambulation and returned to room w/o fault. To BR after session per request and then assisted to the chair w/ call bell in reach and all needs met.  [facility] [name] Mobility Specialist Mobility Specialist [address]: [phone]

## 2022-09-17 NOTE — H&P (View-Only) (Signed)
[facility]  VASCULAR SURGERY ASSESSMENT & PLAN:   SYMPTOMATIC LEFT CAROTID STENOSIS: He is scheduled for a left carotid endarterectomy on Monday.  His Eliquis is on hold pending surgery.  I am aware of his skin biopsy results which showed invasive squamous cell carcinoma.  However I would favor proceeding with surgery so that we can get him through this and he can then aggressively treat the skin cancer.  Gae Gallop, MD 1:41 PM   Assessment/Planning: Symptomatic left ICA stenosis.  Plan for TCAR with DR. Scot Dock 09/20/22.  Eliquise has been stopped and he is being managed on Heparin.  Plan post op will be to start Edoxoban 60 mg daily, rather than Eliquis, once he restarts anticoagulation postoperatively.  As well as ASA Plavix.  No change with mild right UE weakness   Objective (!) 178/74 60 98.4 F (36.9 C) (Oral) 16 92%  Intake/Output Summary (Last 24 hours) at 09/17/2022 0700 Last data filed at 09/16/2022 2135 Gross per 24 hour  Intake 502.59 ml  Output 1150 ml  Net -647.41 ml    Roxy Horseman 09/17/2022 7:00 AM --  Laboratory Lab Results: Recent Labs    09/15/22 0705  WBC 4.0  HGB 11.5*  HCT 33.6*  PLT 146*   BMET Recent Labs    09/15/22 0705  NA 141  K 3.8  CL 108  CO2 24  GLUCOSE 88  BUN 22  CREATININE 1.47*  CALCIUM 9.0    COAG Lab Results  Component Value Date   INR 1.5 (H) 09/13/2022   INR 1.1 02/12/2015   No results found for: "PTT"

## 2022-09-17 NOTE — Progress Notes (Signed)
Physical Therapy Treatment Patient Details Name: Alexander Austin MRN: [id number] DOB: December 23, 1945 Today's Date: 09/17/2022   History of Present Illness Pt is a 76 y/o male who presented with R UE weakness and unsteady gait. CT head showed age indeterminate L frontal infarct, MRI pending if pacemaker compatible. PMH: CHF, CAD s/p CABG x 3, ICD, PPM, a fib, pulmonary hypertension, CKD 4, hx of renal cell cancer s/p nephrectomy.    PT Comments    Patient with limited activity tolerance he reports is his baseline due to longstanding back issues.  But benefiting from working on balance, safety and postural awareness.  He remains appropriate for HHPT at d/c.  PT will continue to follow acutely.    Recommendations for follow up therapy are one component of a multi-disciplinary discharge planning process, led by the attending physician.  Recommendations may be updated based on patient status, additional functional criteria and insurance authorization.  Follow Up Recommendations  Home health PT     Assistance Recommended at Discharge Intermittent Supervision/Assistance  Patient can return home with the following A little help with bathing/dressing/bathroom;A little help with walking and/or transfers;Assistance with cooking/housework;Help with stairs or ramp for entrance;Assist for transportation   Equipment Recommendations  None recommended by PT    Recommendations for Other Services       Precautions / Restrictions Precautions Precautions: Fall     Mobility  Bed Mobility           Sit to supine: Supervision   General bed mobility comments: back to bed after ambulation, cues for positioning    Transfers Overall transfer level: Needs assistance Equipment used: Rolling walker (2 wheels) Transfers: Sit to/from Stand Sit to Stand: Min guard                Ambulation/Gait Ambulation/Gait assistance: Min guard Gait Distance (Feet): 170 Feet Assistive device: Rolling walker (2  wheels) Gait Pattern/deviations: Step-through pattern, Decreased stride length, Wide base of support, Trunk flexed       General Gait Details: safe with RW, but flexed and fatigues with pain in back/legs which is a chronic issue per pt   Stairs             Wheelchair Mobility    Modified Rankin (Stroke Patients Only)       Balance Overall balance assessment: Needs assistance   Sitting balance-Leahy Scale: Good     Standing balance support: Bilateral upper extremity supported Standing balance-Leahy Scale: Poor                 High Level Balance Comments: holding rail in hallway ant/post rocking for limits of stability, side stepping with cues for foot position            Cognition Arousal/Alertness: Awake/alert Behavior During Therapy: WFL for tasks assessed/performed Overall Cognitive Status: History of cognitive impairments - at baseline                                 General Comments: A and O X4. Decreased safety awareness        Exercises      General Comments General comments (skin integrity, edema, etc.): HR 81 with activity, mild SOB with activity      Pertinent Vitals/Pain Pain Assessment Pain Assessment: No/denies pain    Home Living  Prior Function            PT Goals (current goals can now be found in the care plan section) Progress towards PT goals: Progressing toward goals    Frequency    Min 4X/week      PT Plan Current plan remains appropriate    Co-evaluation              AM-PAC PT "6 Clicks" Mobility   Outcome Measure  Help needed turning from your back to your side while in a flat bed without using bedrails?: A Little Help needed moving from lying on your back to sitting on the side of a flat bed without using bedrails?: A Little Help needed moving to and from a bed to a chair (including a wheelchair)?: A Little Help needed standing up from a chair  using your arms (e.g., wheelchair or bedside chair)?: A Little Help needed to walk in hospital room?: A Little Help needed climbing 3-5 steps with a railing? : A Little 6 Click Score: 18    End of Session Equipment Utilized During Treatment: Gait belt Activity Tolerance: Patient limited by fatigue Patient left: in bed;with call bell/phone within reach;with bed alarm set   PT Visit Diagnosis: Muscle weakness (generalized) (M62.81);Other abnormalities of gait and mobility (R26.89)     Time: 3832-9191 PT Time Calculation (min) (ACUTE ONLY): 19 min  Charges:  $Gait Training: 8-22 mins                     Magda Kiel, PT Acute Rehabilitation Services Office:[phone] 09/17/2022    Reginia Naas 09/17/2022, 4:50 PM

## 2022-09-17 NOTE — Progress Notes (Addendum)
[facility]  VASCULAR SURGERY ASSESSMENT & PLAN:   SYMPTOMATIC LEFT CAROTID STENOSIS: He is scheduled for a left carotid endarterectomy on Monday.  His Eliquis is on hold pending surgery.  I am aware of his skin biopsy results which showed invasive squamous cell carcinoma.  However I would favor proceeding with surgery so that we can get him through this and he can then aggressively treat the skin cancer.  Gae Gallop, MD 1:41 PM   Assessment/Planning: Symptomatic left ICA stenosis.  Plan for TCAR with DR. Scot Dock 09/20/22.  Eliquise has been stopped and he is being managed on Heparin.  Plan post op will be to start Edoxoban 60 mg daily, rather than Eliquis, once he restarts anticoagulation postoperatively.  As well as ASA Plavix.  No change with mild right UE weakness   Objective (!) 178/74 60 98.4 F (36.9 C) (Oral) 16 92%  Intake/Output Summary (Last 24 hours) at 09/17/2022 0700 Last data filed at 09/16/2022 2135 Gross per 24 hour  Intake 502.59 ml  Output 1150 ml  Net -647.41 ml    Roxy Horseman 09/17/2022 7:00 AM --  Laboratory Lab Results: Recent Labs    09/15/22 0705  WBC 4.0  HGB 11.5*  HCT 33.6*  PLT 146*   BMET Recent Labs    09/15/22 0705  NA 141  K 3.8  CL 108  CO2 24  GLUCOSE 88  BUN 22  CREATININE 1.47*  CALCIUM 9.0    COAG Lab Results  Component Value Date   INR 1.5 (H) 09/13/2022   INR 1.1 02/12/2015   No results found for: "PTT"

## 2022-09-17 NOTE — Progress Notes (Signed)
[facility]  Subjective:  Denies SSCP, palpitations or Dyspnea   Objective:  Vitals:   09/16/22 2335 09/17/22 0106 09/17/22 0343 09/17/22 0759  BP: (!) 187/85 (!) 183/77 (!) 178/74 (!) 170/73  Pulse: 60  60 (!) 56  Resp: '18  16 14  '$ Temp: 98.1 F (36.7 C)  98.4 F (36.9 C) 98 F (36.7 C)  TempSrc: Oral  Oral Oral  SpO2: 96%  92% 96%  Weight:      Height:        Intake/Output from previous day:  Intake/Output Summary (Last 24 hours) at 09/17/2022 0800 Last data filed at 09/16/2022 2135 Gross per 24 hour  Intake 502.59 ml  Output 1150 ml  Net -647.41 ml    Physical Exam: Overweight white male Left bruit Left Medtronic Bi V AICD Abdomen bening No edema Mild dysarthria and RLE weakness improving   Lab Results: Basic Metabolic Panel: Recent Labs    09/15/22 0705  NA 141  K 3.8  CL 108  CO2 24  GLUCOSE 88  BUN 22  CREATININE 1.47*  CALCIUM 9.0   Liver Function Tests: No results for input(s): "AST", "ALT", "ALKPHOS", "BILITOT", "PROT", "ALBUMIN" in the last 72 hours.  No results for input(s): "LIPASE", "AMYLASE" in the last 72 hours. CBC: Recent Labs    09/15/22 0705  WBC 4.0  HGB 11.5*  HCT 33.6*  MCV 90.8  PLT 146*    Hemoglobin A1C: No results for input(s): "HGBA1C" in the last 72 hours.  Fasting Lipid Panel: No results for input(s): "CHOL", "HDL", "LDLCALC", "TRIG", "CHOLHDL", "LDLDIRECT" in the last 72 hours.    Imaging: VAS US CAROTID  Result Date: 09/15/2022 Carotid Arterial Duplex Study Patient Name:  Alexander Austin  Date of Exam:   09/15/2022 Medical Rec #: [id number]     Accession #:    [id number] Date of Birth: 25-May-1946     Patient Gender: M Patient Age:   76 years Exam Location:  [facility] Procedure:      VAS US CAROTID Referring Phys: Frederich Chick LAMA --------------------------------------------------------------------------------  Indications:       CVA and Sudden onset right sided weakness and numbness with                     difficulty ambulating. CTA neck showed severe stenosis at the                    origin of the left ICA (likely greater than 80%). Risk Factors:      Hypertension, hyperlipidemia, past history of smoking. Other Factors:     Atrial fibrillation. Comparison Study:  09/13/2022- CTA neck Performing Technologist: Maudry Mayhew MHA, RDMS, RVT, RDCS  Examination Guidelines: A complete evaluation includes B-mode imaging, spectral Doppler, color Doppler, and power Doppler as needed of all accessible portions of each vessel. Bilateral testing is considered an integral part of a complete examination. Limited examinations for reoccurring indications may be performed as noted.  Right Carotid Findings: +----------+--------+-------+--------+----------------------+------------------+           PSV cm/sEDV    StenosisPlaque Description    Comments                             cm/s                                                    +----------+--------+-------+--------+----------------------+------------------+  CCA Prox  109     10                                                      +----------+--------+-------+--------+----------------------+------------------+ CCA Distal93      9                                    intimal thickening +----------+--------+-------+--------+----------------------+------------------+ ICA Prox  131     24             smooth and                                                                heterogenous                             +----------+--------+-------+--------+----------------------+------------------+ ICA Distal158     29                                                      +----------+--------+-------+--------+----------------------+------------------+ ECA       89                                           intimal thickening +----------+--------+-------+--------+----------------------+------------------+  +----------+--------+-------+----------------+-------------------+           PSV cm/sEDV cmsDescribe        Arm Pressure (mmHG) +----------+--------+-------+----------------+-------------------+ [id number]             Multiphasic, WNL                    +----------+--------+-------+----------------+-------------------+ +---------+--------+--+--------+--+---------+ VertebralPSV cm/s31EDV cm/s10Antegrade +---------+--------+--+--------+--+---------+  Left Carotid Findings: +----------+--------+--------+--------+------------------------------+---------+           PSV cm/sEDV cm/sStenosisPlaque Description            Comments  +----------+--------+--------+--------+------------------------------+---------+ CCA Prox  100     9                                                       +----------+--------+--------+--------+------------------------------+---------+ CCA Distal71      9               focal and calcific                      +----------+--------+--------+--------+------------------------------+---------+ ICA Prox  232     35              heterogenous, irregular and   Shadowing  calcific                                +----------+--------+--------+--------+------------------------------+---------+ ICA Mid   170     35                                                      +----------+--------+--------+--------+------------------------------+---------+ ICA Distal116     20                                                      +----------+--------+--------+--------+------------------------------+---------+ ECA       246                     heterogenous, irregular and   shadowing                                   calcific                                +----------+--------+--------+--------+------------------------------+---------+ +----------+--------+--------+----------------+-------------------+            PSV cm/sEDV cm/sDescribe        Arm Pressure (mmHG) +----------+--------+--------+----------------+-------------------+ Subclavian117             Multiphasic, WNL                    +----------+--------+--------+----------------+-------------------+ +---------+--------+--+--------+--+---------+ VertebralPSV cm/s64EDV cm/s12Antegrade +---------+--------+--+--------+--+---------+   Summary: Right Carotid: Velocities in the right ICA are consistent with a 1-39% stenosis. Left Carotid: Velocities in the left ICA are consistent with an upper range               1-39% stenosis by end diastolic velocity, and 40-98% stenosis by               peak systolic velocity. Vertebrals:  Bilateral vertebral arteries demonstrate antegrade flow. Subclavians: Normal flow hemodynamics were seen in bilateral subclavian              arteries. *See table(s) above for measurements and observations.  Electronically signed by Antony Contras MD on 09/15/2022 at 2:33:21 PM.    Final    MR BRAIN WO CONTRAST  Result Date: 09/15/2022 CLINICAL DATA:  Stroke, follow-up.  Right-sided weakness. EXAM: MRI HEAD WITHOUT CONTRAST TECHNIQUE: Multiplanar, multiecho pulse sequences of the brain and surrounding structures were obtained without intravenous contrast. COMPARISON:  Head CT and CTA 09/13/2022 FINDINGS: Brain: There is a small acute to early subacute cortical and subcortical infarct in the left frontoparietal region. Multiple chronic cortical and subcortical infarcts are noted in the left greater than right frontal and parietal lobes and in the left occipital lobe. There are also small chronic bilateral cerebellar infarcts. There are a few scattered chronic cerebral and cerebellar microhemorrhages. Mild-to-moderate T2 hyperintensities elsewhere in the cerebral white matter bilaterally are nonspecific but compatible with chronic small vessel ischemic disease. T2 hyperintensities in the basal ganglia bilaterally reflect a  combination of dilated perivascular spaces and chronic lacunar infarcts. Mild chronic small vessel  changes are also present in the thalami and pons. No mass, midline shift, or extra-axial fluid collection is identified. There is moderate cerebral atrophy. Vascular: Major intracranial vascular flow voids are preserved. Skull and upper cervical spine: Unremarkable bone marrow signal. Sinuses/Orbits: Unremarkable orbits. Paranasal sinuses and mastoid air cells are clear. Other: None. IMPRESSION: 1. Small acute to early subacute left frontoparietal infarct. 2. Chronic ischemia with multiple chronic infarcts as above. Electronically Signed   By: Logan Bores M.D.   On: 09/15/2022 11:47    Cardiac Studies:  ECG: Bi V pacing ? Underlying afib    Telemetry:   Echo: EF 50-55%   Medications:    atorvastatin  80 mg Oral Daily   calcitRIOL  0.25 mcg Oral Daily   clopidogrel  75 mg Oral Daily   colchicine  0.3 mg Oral BID   docusate sodium  100 mg Oral Daily   escitalopram  20 mg Oral QHS   febuxostat  80 mg Oral Daily   primidone  100 mg Oral BID      heparin 1,350 Units/hr (09/17/22 0741)    Assessment/Plan:  Preoperative:  Ok to proceed with CEA Monday. Does all ADL's , 5 Mets and most recent cath with patent stent to OM and patent SVG to RCA and LIMA to LAD. No angina or CHF TTE this admission shows EF 50-55%  AICD:  not pacer dependant as far as [facility] can tell [facility] Have notified EP to make sure shocking Rx;s disabled for surgery since device is near left surgical CEA field Tommye Standard has seen normal device function and management per Anesthesia protocol He is not device dependant  PAF:  Anticoagulation has been with Eliquis He has been on primidone for years for tremors Interaction makes Eliquis concentration low and this may have had something to do with his stroke Ideally would stop primidone Discussed with pharmacy and would change Edoxoban 60 mg daily rather than restarting  eliquis Currently on heparin  Stroke:  Dr Scot Dock has seen Mixed results US/duplex with only 26-83% LICA velocities 4.19Q/QIW systolic and 9.79 m/sec diastolic CTA suggests > 89% stenosis and stroke fits LICA distribution See caveat above regarding lack of proper anticoagulation   Jenkins Rouge 09/17/2022, 8:00 AM

## 2022-09-17 NOTE — Progress Notes (Signed)
[facility]  PROGRESS NOTE  Alexander Austin:[id number] DOB: October 20, 1946 DOA: 09/13/2022 PCP: Windy Fast, MD   Brief HPI:   76 year old male with medical history of chronic systolic heart failure, CAD s/p CABG x3 with ICD and pacemaker, paroxysmal atrial fibrillation on Eliquis, CVA, pulmonary hypertension, CKD stage IV with history of renal cell cancer s/p nephrectomy presented with right-sided weakness.  Patient takes Plavix and Eliquis at home. In the ED he was hypertensive blood pressure 160/64 Code stroke was called, CT head showed small cortical/subcortical infarct in the left frontal lobe that was new from 2015.  CTA head and neck showed no large vessel occlusion but had severe stenosis of the origin of left ICA greater than 80%. Patient was not a candidate for TNKase, since he is on Eliquis.  Neurology was consulted    Subjective   Patient seen and examined, no new complaints.   Assessment/Plan:    CVA -CT head showed small cortical/subcortical infarct within the mid to posterior left frontal lobe, left MCA/ACA watershed territory, new from prior head CT of 09/07/2014 -CT head and neck showed severe stenosis of the origin of left ICA greater than 80%; carotid duplex obtained which shows 60 to 79% left ICA stenosis.   -vascular surgery consulted, called and discussed Dr. Doren Custard who saw the patient and plan for surgery on Monday 09-20-22. -Continue Lipitor 80 mg daily -MRI brain pending compatibility of his Medtronic/ICD pacemaker -Echocardiogram ordered -Follow hemoglobin A1c, lipid profile PT/OT/speech therapy -Permissive hypertension, treat for BP greater than 220/120 - -Neurology has signed off  Internal carotid artery stenosis, left -Severe stenosis near the origin of left ICA likely more than 80% -Vascular surgery consulted for further recommendations -Plan for carotid endarterectomy on Monday -Started on IV heparin  Chronic kidney disease stage  IIIa -Creatinine is stable  Hypertension -Home antihypertensives on hold for permissive hypertension -Okay for blood pressure less than 220/120 -We will gradually restart home medications -Patient takes amlodipine 10 mg daily, Coreg 25 mg twice daily at home -We will start Coreg at low-dose of 12.5 mg p.o. twice daily  Paroxysmal atrial fibrillation -Apixaban on hold -Vascular surgery recommended to continue with Plavix for now and start Eliquis post op -Cardiology was consulted for preop evaluation -Found out that patient was on Eliquis and primidone and primidone interacts with Eliquis and makes it less effective; which may explain that patient had stroke while being on Eliquis -Eliquis has been discontinued, patient started on heparin per pharmacy by vascular surgery -Plan to start edoxaban and Plavix post op  CAD s/p CABG -Continue Plavix  Hypokalemia -Replete  Medications     atorvastatin  80 mg Oral Daily   calcitRIOL  0.25 mcg Oral Daily   clopidogrel  75 mg Oral Daily   colchicine  0.3 mg Oral BID   docusate sodium  100 mg Oral Daily   escitalopram  20 mg Oral QHS   febuxostat  80 mg Oral Daily   primidone  100 mg Oral BID     Data Reviewed:   CBG:  Recent Labs  Lab 09/13/22 1748  GLUCAP 122*    SpO2: 96 %    Vitals:   09/16/22 2335 09/17/22 0106 09/17/22 0343 09/17/22 0759  BP: (!) 187/85 (!) 183/77 (!) 178/74 (!) 170/73  Pulse: 60  60 (!) 56  Resp: '18  16 14  '$ Temp: 98.1 F (36.7 C)  98.4 F (36.9 C) 98 F (36.7 C)  TempSrc: Oral  Oral Oral  SpO2: 96%  92% 96%  Weight:      Height:          Data Reviewed:  Basic Metabolic Panel: Recent Labs  Lab 09/13/22 1753 09/14/22 0333 09/15/22 0705  NA 142 142 141  K 4.9 3.2* 3.8  CL 108 111 108  CO2  --  26 24  GLUCOSE 121* 99 88  BUN 28* 21 22  CREATININE 1.60* 1.63* 1.47*  CALCIUM  --  8.9 9.0    CBC: Recent Labs  Lab 09/13/22 1750 09/13/22 1753 09/15/22 0705  WBC 4.3  --  4.0   NEUTROABS 3.3  --   --   HGB 12.3* 11.6* 11.5*  HCT 36.7* 34.0* 33.6*  MCV 90.8  --  90.8  PLT 165  --  146*    LFT Recent Labs  Lab 09/14/22 0333  AST 16  ALT 16  ALKPHOS 83  BILITOT 0.9  PROT 5.9*  ALBUMIN 3.3*     Antibiotics: Anti-infectives (From admission, onward)    None        DVT prophylaxis: Started on heparin per pharmacy  Code Status: Partial code, no intubation mechanical ventilation  Family Communication: No family at bedside   CONSULTS neurology   Objective    Physical Examination:  General-appears in no acute distress Heart-S1-S2, regular, no murmur auscultated Lungs-clear to auscultation bilaterally, no wheezing or crackles auscultated Abdomen-soft, nontender, no organomegaly Extremities-no edema in the lower extremities Neuro-alert, oriented x3, no focal deficit noted   Status is: Inpatient:         Alexander Austin   [facility] If 7PM-7AM, please contact night-coverage at [email or web address], Office  [phone]   09/17/2022, 10:05 AM  LOS: 3 days

## 2022-09-17 NOTE — Progress Notes (Signed)
ANTICOAGULATION CONSULT NOTE - Initial Consult  Pharmacy Consult for heparin Indication: atrial fibrillation  Allergies  Allergen Reactions   Isosorbide Nitrate Anaphylaxis    Other reaction(s): Cardiovascular Arrest (ALLERGY/intolerance) Can take Sublingual Nitro.    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    Patient Measurements: Height: '6\' 1"'$  (185.4 cm) Weight: 112.4 kg (247 lb 12.8 oz) IBW/kg (Calculated) : 79.9 Heparin Dosing Weight: 103kg  Vital Signs: Temp: 98.4 F (36.9 C) (09/29 0343) Temp Source: Oral (09/29 0343) BP: 178/74 (09/29 0343) Pulse Rate: 60 (09/29 0343)  Labs: Recent Labs    09/15/22 0705 09/16/22 1751 09/17/22 0458  HGB 11.5*  --   --   HCT 33.6*  --   --   PLT 146*  --   --   APTT  --  100* 104*  HEPARINUNFRC  --   --  0.97*  CREATININE 1.47*  --   --     Estimated Creatinine Clearance: 55.3 mL/min (A) (by C-G formula based on SCr of 1.47 mg/dL (H)).   Medical History: Past Medical History:  Diagnosis Date   A-fib [facility])    Anxiety    Benign prostate hyperplasia    Cancer ([place])    Kidney   Chronic kidney disease    Congestive heart failure (CHF) (HCC)    CVA (cerebral infarction)    Depression    Dyspnea    GERD (gastroesophageal reflux disease)    Gout    Hematospermia 01/13/2012   History of myocardial infarction 1993   Hyperlipidemia    Hypertension    Sleep apnea    Stroke [facility])    Vitamin B 12 deficiency    Weakness of left leg 03/28/2020    Assessment: 76 yo M admitted with small acute left frontoparietal embolic infarcts while on apixaban PTA for afib. Apixaban now held for planned L carotid endarterectomy 10/2. Last dose apixaban 9/27 at 22:00. Pharmacy consulted for heparin. After procedure, plan to switch to edoxaban due to drug interaction with primidone. Patient fills medications through the [place].  APTT 104 is just supratherapeutic on 1450 units/hr. Heparin  level 0.97 is supratherapeutic and still affected by apixaban.   Goal of Therapy:  Heparin level 0.3- 0.7 units/ml APTT 66-102 sec Monitor platelets by anticoagulation protocol: Yes   Plan:  Decrease heparin to 1350 units/ hr F/u aPTT until correlates with heparin level  F/u APTT in 6- 8 hrs Monitor daily heparin level, CBC Monitor for signs/symptoms of bleeding   Benetta Spar, PharmD, BCPS, BCCP Clinical Pharmacist  Please check AMION for all [place] phone numbers After 10:00 PM, call [place]

## 2022-09-18 DIAGNOSIS — I639 Cerebral infarction, unspecified: Secondary | ICD-10-CM | POA: Diagnosis not present

## 2022-09-18 LAB — CBC
HCT: 33.3 % — ABNORMAL LOW (ref 39.0–52.0)
Hemoglobin: 11.3 g/dL — ABNORMAL LOW (ref 13.0–17.0)
MCH: 31 pg (ref 26.0–34.0)
MCHC: 33.9 g/dL (ref 30.0–36.0)
MCV: 91.2 fL (ref 80.0–100.0)
Platelets: 152 10*3/uL (ref 150–400)
RBC: 3.65 MIL/uL — ABNORMAL LOW (ref 4.22–5.81)
RDW: 14.7 % (ref 11.5–15.5)
WBC: 5.3 10*3/uL (ref 4.0–10.5)
nRBC: 0 % (ref 0.0–0.2)

## 2022-09-18 LAB — APTT: aPTT: 99 seconds — ABNORMAL HIGH (ref 24–36)

## 2022-09-18 NOTE — Progress Notes (Signed)
ANTICOAGULATION CONSULT NOTE   Pharmacy Consult for heparin Indication: atrial fibrillation  Allergies  Allergen Reactions   Isosorbide Nitrate Anaphylaxis    Other reaction(s): Cardiovascular Arrest (ALLERGY/intolerance) Can take Sublingual Nitro.    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    Patient Measurements: Height: '6\' 1"'$  (185.4 cm) Weight: 112.4 kg (247 lb 12.8 oz) IBW/kg (Calculated) : 79.9 Heparin Dosing Weight: 103kg  Vital Signs: Temp: 97.6 F (36.4 C) (09/30 0827) Temp Source: Oral (09/30 0827) BP: 172/79 (09/30 0827) Pulse Rate: 57 (09/30 0827)  Labs: Recent Labs    09/17/22 0458 09/17/22 1416 09/18/22 0655  HGB  --  13.3 11.3*  HCT  --  38.4* 33.3*  PLT  --  190 152  APTT 104* 74* 99*  HEPARINUNFRC 0.97*  --   --     Estimated Creatinine Clearance: 55.3 mL/min (A) (by C-G formula based on SCr of 1.47 mg/dL (H)).   Medical History: Past Medical History:  Diagnosis Date   A-fib [facility])    Anxiety    Benign prostate hyperplasia    Cancer ([place])    Kidney   Chronic kidney disease    Congestive heart failure (CHF) (HCC)    CVA (cerebral infarction)    Depression    Dyspnea    GERD (gastroesophageal reflux disease)    Gout    Hematospermia 01/13/2012   History of myocardial infarction 1993   Hyperlipidemia    Hypertension    Sleep apnea    Stroke [facility])    Vitamin B 12 deficiency    Weakness of left leg 03/28/2020    Assessment: 76 yo M admitted with small acute left frontoparietal embolic infarcts while on apixaban PTA for afib. Apixaban now held for planned L carotid endarterectomy 10/2. Last dose apixaban 9/27 at 22:00. Pharmacy consulted for heparin. After procedure, plan to switch to edoxaban due to drug interaction with primidone. Patient fills medications through the [place].  APTT 99 sec, therapeutic  Heparin running appropriately at 1350 units/hr. No issues noted with infusion. No  overt s/sx of bleeding.   Goal of Therapy:  Heparin level 0.3- 0.7 units/ml APTT 66-102 sec Monitor platelets by anticoagulation protocol: Yes   Plan:  Continue heparin to 1350 units/ hr F/u aPTT until correlates with heparin level  Monitor daily aptt, heparin level, CBC, and s/sx of bleeding Will follow plans for heparin transition to edoxaban   Thank you for allowing pharmacy to participate in this patient's care.  Levonne Spiller, PharmD 09/18/2022,9:29 AM

## 2022-09-18 NOTE — Progress Notes (Signed)
Mobility Specialist Progress Note:   09/18/22 1330  Mobility  Activity Ambulated with assistance in hallway  Level of Assistance Contact guard assist, steadying assist  Assistive Device Four wheel walker  Distance Ambulated (ft) 300 ft  Activity Response Tolerated well  $Mobility charge 1 Mobility   Pt eager for mobility session. Only minG assist required for ambulation. Pt back in chair with chair alarm on, all needs met.   Nelta Numbers Acute Rehab Secure Chat or Office Phone: [phone]

## 2022-09-18 NOTE — Progress Notes (Signed)
Mobility Specialist Progress Note:   09/18/22 1115  Mobility  Activity Transferred from bed to chair  Level of Assistance Minimal assist, patient does 75% or more  Assistive Device Other (Comment) (HHA)  Distance Ambulated (ft) 3 ft  Activity Response Tolerated well  $Mobility charge 1 Mobility   Pt eager for mobility however deferring ambulation until after breakfast. Requested to transfer to chair to eat. Left with all needs met, chair alarm on. Will f/u for ambulation.   Nelta Numbers Acute Rehab Secure Chat or Office Phone: [phone]

## 2022-09-18 NOTE — Progress Notes (Signed)
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  Patient takes Plavix and Eliquis at home. In the ED he was hypertensive blood pressure 160/64 Code stroke was called, CT head showed small cortical/subcortical infarct in the left frontal lobe that was new from 2015.  CTA head and neck showed no large vessel occlusion but had severe stenosis of the origin of left ICA greater than 80%. Patient was not a candidate for TNKase, since he is on Eliquis.  Neurology was consulted    Subjective   Patient seen and examined, no new complaints.  Plan for surgery on Monday    Assessment/Plan:    CVA -CT head showed small cortical/subcortical infarct within the mid to posterior left frontal lobe, left MCA/ACA watershed territory, new from prior head CT of 09/07/2014 -CT head and neck showed severe stenosis of the origin of left ICA greater than 80%; carotid duplex obtained which shows 60 to 79% left ICA stenosis.   -vascular surgery consulted, called and discussed Dr. Doren Custard who saw the patient and plan for surgery on Monday 09-20-22. -Continue Lipitor 80 mg daily -MRI brain pending compatibility of his Medtronic/ICD pacemaker -Echocardiogram ordered -Follow hemoglobin A1c, lipid profile PT/OT/speech therapy -Permissive hypertension, treat for BP greater than 220/120 -Started back on low-dose Coreg 12.5 mg p.o. twice daily -Neurology has signed off  Internal carotid artery stenosis, left -Severe stenosis near the origin of left ICA likely more than 80% -Vascular surgery consulted for further recommendations -Plan for carotid  endarterectomy on Monday -Started on IV heparin  Chronic kidney disease stage IIIa -Creatinine is stable  Hypertension -Home antihypertensives on hold for permissive hypertension -Okay for blood pressure less than 220/120 -We will gradually restart home medications -Patient takes amlodipine 10 mg daily, Coreg 25 mg twice daily at home -Started back on low-dose of Coreg 12.5 mg p.o. twice daily  Paroxysmal atrial fibrillation -Apixaban on hold -Vascular surgery recommended to continue with Plavix for now and start Eliquis post op -Cardiology was consulted for preop evaluation -Found out that patient was on Eliquis and primidone and primidone interacts with Eliquis and makes it less effective; which may explain that patient had stroke while being on Eliquis -Eliquis has been discontinued, patient started on heparin per pharmacy by vascular surgery -Plan to start edoxaban and Plavix post op  CAD s/p CABG -Continue Plavix  Hypokalemia -Replete  Medications     atorvastatin  80 mg Oral Daily   calcitRIOL  0.25 mcg Oral Daily   carvedilol  12.5 mg Oral BID WC   clopidogrel  75 mg Oral Daily   colchicine  0.3 mg Oral BID   docusate sodium  100 mg Oral Daily   escitalopram  20 mg Oral QHS   febuxostat  80 mg Oral Daily   primidone  100 mg Oral BID     Data Reviewed:   CBG:  Recent Labs  Lab 09/13/22 1748  GLUCAP 122*    SpO2: 99 %    Vitals:   09/17/22 2125 09/18/22 0012 09/18/22 0348 09/18/22 0827  BP: (!) 181/62 (!) 186/76 (!) 167/69 (!) 172/79  Pulse: (!) 59 60 (!) 58 (!) 57  Resp: [name])  $'22 20 18 16  'N$ Temp: (!) 97.4 F (36.3 C) 98.5 F (36.9 C) 97.9 F (36.6 C) 97.6 F (36.4 C)  TempSrc: Oral Oral Oral Oral  SpO2: 94% 97% 98% 99%  Weight:      Height:          Data Reviewed:  Basic Metabolic Panel: Recent Labs  Lab 09/13/22 1753 09/14/22 0333 09/15/22 0705  NA 142 142 141  K 4.9 3.2* 3.8  CL 108 111 108  CO2  --  26 24  GLUCOSE 121* 99 88   BUN 28* 21 22  CREATININE 1.60* 1.63* 1.47*  CALCIUM  --  8.9 9.0    CBC: Recent Labs  Lab 09/13/22 1750 09/13/22 1753 09/15/22 0705 09/17/22 1416 09/18/22 0655  WBC 4.3  --  4.0 5.3 5.3  NEUTROABS 3.3  --   --   --   --   HGB 12.3* 11.6* 11.5* 13.3 11.3*  HCT 36.7* 34.0* 33.6* 38.4* 33.3*  MCV 90.8  --  90.8 89.1 91.2  PLT 165  --  146* 190 152    LFT Recent Labs  Lab 09/14/22 0333  AST 16  ALT 16  ALKPHOS 83  BILITOT 0.9  PROT 5.9*  ALBUMIN 3.3*     Antibiotics: Anti-infectives (From admission, onward)    None        DVT prophylaxis: Started on heparin per pharmacy  Code Status: Partial code, no intubation mechanical ventilation  Family Communication: No family at bedside   CONSULTS neurology   Objective    Physical Examination:  General-appears in no acute distress Heart-S1-S2, regular, no murmur auscultated Lungs-clear to auscultation bilaterally, no wheezing or crackles auscultated Abdomen-soft, nontender, no organomegaly Extremities-no edema in the lower extremities Neuro-alert, oriented x3, no focal deficit noted  Status is: Inpatient:         Alexander Austin   [facility] If 7PM-7AM, please contact night-coverage at [email or web address], Office  [phone]   09/18/2022, 10:44 AM  LOS: 4 days

## 2022-09-19 DIAGNOSIS — N1831 Chronic kidney disease, stage 3a: Secondary | ICD-10-CM | POA: Diagnosis not present

## 2022-09-19 DIAGNOSIS — I639 Cerebral infarction, unspecified: Secondary | ICD-10-CM | POA: Diagnosis not present

## 2022-09-19 DIAGNOSIS — I10 Essential (primary) hypertension: Secondary | ICD-10-CM | POA: Diagnosis not present

## 2022-09-19 DIAGNOSIS — I6522 Occlusion and stenosis of left carotid artery: Secondary | ICD-10-CM | POA: Diagnosis not present

## 2022-09-19 LAB — BASIC METABOLIC PANEL
Anion gap: 6 (ref 5–15)
BUN: 17 mg/dL (ref 8–23)
CO2: 23 mmol/L (ref 22–32)
Calcium: 8.2 mg/dL — ABNORMAL LOW (ref 8.9–10.3)
Chloride: 109 mmol/L (ref 98–111)
Creatinine, Ser: 1.45 mg/dL — ABNORMAL HIGH (ref 0.61–1.24)
GFR, Estimated: 50 mL/min — ABNORMAL LOW (ref >60)
Glucose, Bld: 85 mg/dL (ref 70–99)
Potassium: 3.7 mmol/L (ref 3.5–5.1)
Sodium: 138 mmol/L (ref 135–145)

## 2022-09-19 LAB — APTT: aPTT: 97 seconds — ABNORMAL HIGH (ref 24–36)

## 2022-09-19 LAB — CBC
HCT: 31.7 % — ABNORMAL LOW (ref 39.0–52.0)
Hemoglobin: 11 g/dL — ABNORMAL LOW (ref 13.0–17.0)
MCH: 31 pg (ref 26.0–34.0)
MCHC: 34.7 g/dL (ref 30.0–36.0)
MCV: 89.3 fL (ref 80.0–100.0)
Platelets: 147 10*3/uL — ABNORMAL LOW (ref 150–400)
RBC: 3.55 MIL/uL — ABNORMAL LOW (ref 4.22–5.81)
RDW: 14.6 % (ref 11.5–15.5)
WBC: 3.9 10*3/uL — ABNORMAL LOW (ref 4.0–10.5)
nRBC: 0 % (ref 0.0–0.2)

## 2022-09-19 LAB — HEPARIN LEVEL (UNFRACTIONATED): Heparin Unfractionated: 0.33 IU/mL (ref 0.30–0.70)

## 2022-09-19 MED ORDER — CEFAZOLIN SODIUM-DEXTROSE 2-4 GM/100ML-% IV SOLN: 2.0000 g | INTRAVENOUS | Status: DC

## 2022-09-19 MED ORDER — ASPIRIN 81 MG PO CHEW
81.0000 mg | CHEWABLE_TABLET | Freq: Every day | ORAL | Status: DC
  Administered 2022-09-19: 81 mg via ORAL
  Filled 2022-09-19: qty 1

## 2022-09-19 MED ORDER — CARVEDILOL 12.5 MG PO TABS
12.5000 mg | ORAL_TABLET | Freq: Once | ORAL | Status: AC
  Administered 2022-09-19: 12.5 mg via ORAL
  Filled 2022-09-19: qty 1

## 2022-09-19 MED ORDER — CARVEDILOL 25 MG PO TABS
25.0000 mg | ORAL_TABLET | Freq: Two times a day (BID) | ORAL | Status: DC
  Administered 2022-09-19 – 2022-09-21 (×4): 25 mg via ORAL
  Filled 2022-09-19: qty 2
  Filled 2022-09-19 (×3): qty 1

## 2022-09-19 MED ORDER — AMLODIPINE BESYLATE 10 MG PO TABS
10.0000 mg | ORAL_TABLET | Freq: Every day | ORAL | Status: DC
  Administered 2022-09-19 – 2022-09-29 (×10): 10 mg via ORAL
  Filled 2022-09-19 (×10): qty 1

## 2022-09-19 NOTE — Progress Notes (Signed)
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  Patient takes Plavix and Eliquis at home. In the ED he was hypertensive blood pressure 160/64 Code stroke was called, CT head showed small cortical/subcortical infarct in the left frontal lobe that was new from 2015.  CTA head and neck showed no large vessel occlusion but had severe stenosis of the origin of left ICA greater than 80%. Patient was not a candidate for TNKase, since he is on Eliquis.  Neurology was consulted    Subjective   Patient seen and examined, no new complaints.  Plan for surgery on Monday    Assessment/Plan:    CVA -CT head showed small cortical/subcortical infarct within the mid to posterior left frontal lobe, left MCA/ACA watershed territory, new from prior head CT of 09/07/2014 -CT head and neck showed severe stenosis of the origin of left ICA greater than 80%; carotid duplex obtained which shows 60 to 79% left ICA stenosis.   -vascular surgery consulted, called and discussed Dr. Doren Custard who saw the patient and plan for surgery on Monday 09-20-22. -Continue Lipitor 80 mg daily -MRI brain pending compatibility of his Medtronic/ICD pacemaker -Echocardiogram ordered -Follow hemoglobin A1c, lipid profile PT/OT/speech therapy -Permissive hypertension, treat for BP greater than 220/120 -Started back on low-dose Coreg 12.5 mg p.o. twice daily -Neurology has signed off  Internal carotid artery stenosis, left -Severe stenosis near the origin of left ICA likely more than 80% -Vascular surgery consulted for further recommendations -Plan for carotid  endarterectomy on Monday -Started on IV heparin -Plavix has been held by vascular surgery  Chronic kidney disease stage IIIa -Creatinine is stable  Hypertension -Home antihypertensives on hold for permissive hypertension -Okay for blood pressure less than 220/120 -We will gradually restart home medications -Patient takes amlodipine 10 mg daily, Coreg 25 mg twice daily at home -Started back on low-dose of Coreg 12.5 mg p.o. twice daily -Blood pressure is now elevated; will change Coreg to 25 mg p.o. twice daily -We will restart home dose of amlodipine 10 mg daily  Paroxysmal atrial fibrillation -Apixaban on hold in anticipation for surgery -Vascular surgery recommended to continue with Plavix for now and start Eliquis post op -Cardiology was consulted for preop evaluation -Found out that patient was on Eliquis and primidone and primidone interacts with Eliquis and makes it less effective; which may explain that patient had stroke while being on Eliquis -Eliquis has been discontinued, patient started on heparin per pharmacy by vascular surgery -Plan to start edoxaban and Plavix post op  CAD s/p CABG -Continue Plavix  Hypokalemia -Replete  Medications     aspirin  81 mg Oral Daily   atorvastatin  80 mg Oral Daily   calcitRIOL  0.25 mcg Oral Daily   carvedilol  12.5 mg Oral BID WC   colchicine  0.3 mg Oral BID   docusate sodium  100 mg Oral Daily   escitalopram  20 mg Oral QHS   febuxostat  80 mg Oral Daily   primidone  100 mg Oral BID     Data Reviewed:   CBG:  Recent Labs  Lab 09/13/22 1748  GLUCAP 122*    SpO2: 94 %  Vitals:   09/18/22 2348 09/19/22 0318 09/19/22 0815 09/19/22 0816  BP: (!) 169/71 (!) 172/66 (!) 185/70   Pulse: (!) 59 (!) 56 (!) 54   Resp: '20 20 18   '$ Temp: 97.7 F (36.5 C) 97.8 F (36.6 C) 97.9 F (36.6 C)   TempSrc: Oral Oral Oral   SpO2: 96% 94% (!) 88% 94%  Weight:      Height:          Data Reviewed:  Basic Metabolic  Panel: Recent Labs  Lab 09/13/22 1753 09/14/22 0333 09/15/22 0705 09/19/22 0720  NA 142 142 141 138  K 4.9 3.2* 3.8 3.7  CL 108 111 108 109  CO2  --  '26 24 23  '$ GLUCOSE 121* 99 88 85  BUN 28* '21 22 17  '$ CREATININE 1.60* 1.63* 1.47* 1.45*  CALCIUM  --  8.9 9.0 8.2*    CBC: Recent Labs  Lab 09/13/22 1750 09/13/22 1753 09/15/22 0705 09/17/22 1416 09/18/22 0655 09/19/22 0720  WBC 4.3  --  4.0 5.3 5.3 3.9*  NEUTROABS 3.3  --   --   --   --   --   HGB 12.3* 11.6* 11.5* 13.3 11.3* 11.0*  HCT 36.7* 34.0* 33.6* 38.4* 33.3* 31.7*  MCV 90.8  --  90.8 89.1 91.2 89.3  PLT 165  --  146* 190 152 147*    LFT Recent Labs  Lab 09/14/22 0333  AST 16  ALT 16  ALKPHOS 83  BILITOT 0.9  PROT 5.9*  ALBUMIN 3.3*     Antibiotics: Anti-infectives (From admission, onward)    Start     Dose/Rate Route Frequency Ordered Stop   09/20/22 0600  ceFAZolin (ANCEF) IVPB 2g/100 mL premix       Note to Pharmacy: Send with pt to OR   2 g 200 mL/hr over 30 Minutes Intravenous On call 09/19/22 0726 09/21/22 0600        DVT prophylaxis: Started on heparin per pharmacy  Code Status: Partial code, no intubation mechanical ventilation  Family Communication: No family at bedside   CONSULTS neurology   Objective    Physical Examination:  General-appears in no acute distress Heart-S1-S2, regular, no murmur auscultated Lungs-clear to auscultation bilaterally, no wheezing or crackles auscultated Abdomen-soft, nontender, no organomegaly Extremities-no edema in the lower extremities Neuro-alert, oriented x3, no focal deficit noted  Status is: Inpatient:         Oswald Hillock   [facility] If 7PM-7AM, please contact night-coverage at [email or web address], Office  [phone]   09/19/2022, 10:10 AM  LOS: 5 days

## 2022-09-19 NOTE — Progress Notes (Signed)
ANTICOAGULATION CONSULT NOTE   Pharmacy Consult for heparin Indication: atrial fibrillation  Allergies  Allergen Reactions   Isosorbide Nitrate Anaphylaxis    Other reaction(s): Cardiovascular Arrest (ALLERGY/intolerance) Can take Sublingual Nitro.    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    Patient Measurements: Height: '6\' 1"'$  (185.4 cm) Weight: 112.4 kg (247 lb 12.8 oz) IBW/kg (Calculated) : 79.9 Heparin Dosing Weight: 103kg  Vital Signs: Temp: 97.8 F (36.6 C) (10/01 0318) Temp Source: Oral (10/01 0318) BP: 172/66 (10/01 0318) Pulse Rate: 56 (10/01 0318)  Labs: Recent Labs    09/17/22 0458 09/17/22 1416 09/18/22 0655  HGB  --  13.3 11.3*  HCT  --  38.4* 33.3*  PLT  --  190 152  APTT 104* 74* 99*  HEPARINUNFRC 0.97*  --   --      Estimated Creatinine Clearance: 55.3 mL/min (A) (by C-G formula based on SCr of 1.47 mg/dL (H)).   Medical History: Past Medical History:  Diagnosis Date   A-fib [facility])    Anxiety    Benign prostate hyperplasia    Cancer ([place])    Kidney   Chronic kidney disease    Congestive heart failure (CHF) (HCC)    CVA (cerebral infarction)    Depression    Dyspnea    GERD (gastroesophageal reflux disease)    Gout    Hematospermia 01/13/2012   History of myocardial infarction 1993   Hyperlipidemia    Hypertension    Sleep apnea    Stroke [facility])    Vitamin B 12 deficiency    Weakness of left leg 03/28/2020    Assessment: 76 yo M admitted with small acute left frontoparietal embolic infarcts while on apixaban PTA for afib. Apixaban now held for planned L carotid endarterectomy 10/2. Last dose apixaban 9/27 at 22:00. Pharmacy consulted for heparin. After procedure, plan to switch to edoxaban due to drug interaction with primidone. Patient fills medications through the [place].  10/1: APTT 97 sec (therapeutic); HL 0.33 (therapeutic)  Heparin running appropriately at 1350 units/hr. No  issues noted with infusion. No overt s/sx of bleeding.   Goal of Therapy:  Heparin level 0.3- 0.7 units/ml APTT 66-102 sec Monitor platelets by anticoagulation protocol: Yes   Plan:  Continue heparin to 1350 units/ hr- stop at 0500 10/2 AM per [facility]  Monitor daily CBC, and s/sx of bleeding  Will follow plans for heparin transition to edoxaban   Thank you for allowing pharmacy to participate in this patient's care.  Adria Dill, PharmD PGY-2 Infectious Diseases Resident  09/19/2022 7:09 AM

## 2022-09-19 NOTE — Progress Notes (Signed)
Mobility Specialist Progress Note:   09/19/22 1045  Mobility  Activity Ambulated with assistance in hallway  Level of Assistance Standby assist, set-up cues, supervision of patient - no hands on  Assistive Device Four wheel walker  Distance Ambulated (ft) 300 ft  Activity Response Tolerated well  $Mobility charge 1 Mobility   Pt eager for mobility session. Required minG for safety. Pt back in chair with all needs met, chair alarm on.   Alexander Austin Acute Rehab Secure Chat or Office Phone: [phone]

## 2022-09-19 NOTE — Progress Notes (Addendum)
    VASCULAR SURGERY ASSESSMENT & PLAN:   SYMPTOMATIC LEFT CAROTID STENOSIS: He is scheduled for left carotid endarterectomy tomorrow.  His Eliquis has been on hold and he is on IV heparin.  I will stop this at 5 AM tomorrow.  He is on Plavix and is on a statin.  I will give him a dose of aspirin today but he can stop that once he goes back on Eliquis.  I have reviewed the indications for carotid endarterectomy, that is to lower the risk of future stroke. I have also reviewed the potential complications of surgery, including but not limited to: bleeding, stroke (perioperative risk 1-2%), MI, nerve injury of other unpredictable medical problems. All of the patients questions were answered and they are agreeable to proceed with surgery.  He will be at slightly high risk for bleeding as he is on Plavix.  I have held today's dose.  SUBJECTIVE:   No complaints this morning.  He does not have any questions about his upcoming surgery.  PHYSICAL EXAM:   Vitals:   09/18/22 1620 09/18/22 1940 09/18/22 2348 09/19/22 0318  BP: (!) 168/71 (!) 176/68 (!) 169/71 (!) 172/66  Pulse: 60 (!) 59 (!) 59 (!) 56  Resp: '16 20 20 20  '$ Temp: (!) 97.5 F (36.4 C) 98.1 F (36.7 C) 97.7 F (36.5 C) 97.8 F (36.6 C)  TempSrc: Oral Oral Oral Oral  SpO2: 95% 95% 96% 94%  Weight:      Height:       Still some mild weakness to grip on the right.  LABS:   Lab Results  Component Value Date   WBC 5.3 09/18/2022   HGB 11.3 (L) 09/18/2022   HCT 33.3 (L) 09/18/2022   MCV 91.2 09/18/2022   PLT 152 09/18/2022   Lab Results  Component Value Date   CREATININE 1.47 (H) 09/15/2022   Lab Results  Component Value Date   INR 1.5 (H) 09/13/2022    PROBLEM LIST:    Principal Problem:   CVA (cerebral vascular accident) ([place]) Active Problems:   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 ([place])   PAF (paroxysmal atrial fibrillation) ([place])   Essential  hypertension, benign   Chronic kidney disease, stage 3a ([place])   Obesity (BMI 30.0-34.9)   Internal carotid artery stenosis, left   Skin wound from surgical incision   AICD (automatic cardioverter/defibrillator) present   Preoperative cardiovascular examination   CURRENT MEDS:    atorvastatin  80 mg Oral Daily   calcitRIOL  0.25 mcg Oral Daily   carvedilol  12.5 mg Oral BID WC   clopidogrel  75 mg Oral Daily   colchicine  0.3 mg Oral BID   docusate sodium  100 mg Oral Daily   escitalopram  20 mg Oral QHS   febuxostat  80 mg Oral Daily   primidone  100 mg Oral BID    Deitra Mayo Office: [phone] 09/19/2022

## 2022-09-20 ENCOUNTER — Inpatient Hospital Stay (HOSPITAL_COMMUNITY): Payer: No Typology Code available for payment source | Admitting: Certified Registered"

## 2022-09-20 ENCOUNTER — Encounter (HOSPITAL_COMMUNITY)
Admission: EM | Disposition: A | Discharge status: Skilled Nursing Facility | Payer: Self-pay | Source: Home / Self Care | Attending: Family Medicine

## 2022-09-20 ENCOUNTER — Encounter (HOSPITAL_COMMUNITY): Payer: Self-pay | Admitting: Family Medicine

## 2022-09-20 ENCOUNTER — Other Ambulatory Visit: Payer: Self-pay

## 2022-09-20 DIAGNOSIS — Z95810 Presence of automatic (implantable) cardiac defibrillator: Secondary | ICD-10-CM | POA: Diagnosis not present

## 2022-09-20 DIAGNOSIS — I25708 Atherosclerosis of coronary artery bypass graft(s), unspecified, with other forms of angina pectoris: Secondary | ICD-10-CM | POA: Diagnosis not present

## 2022-09-20 DIAGNOSIS — Z951 Presence of aortocoronary bypass graft: Secondary | ICD-10-CM | POA: Diagnosis not present

## 2022-09-20 DIAGNOSIS — I63232 Cerebral infarction due to unspecified occlusion or stenosis of left carotid arteries: Secondary | ICD-10-CM

## 2022-09-20 DIAGNOSIS — I509 Heart failure, unspecified: Secondary | ICD-10-CM

## 2022-09-20 DIAGNOSIS — I110 Hypertensive heart disease with heart failure: Secondary | ICD-10-CM | POA: Diagnosis not present

## 2022-09-20 DIAGNOSIS — I2510 Atherosclerotic heart disease of native coronary artery without angina pectoris: Secondary | ICD-10-CM | POA: Diagnosis not present

## 2022-09-20 DIAGNOSIS — Z87891 Personal history of nicotine dependence: Secondary | ICD-10-CM

## 2022-09-20 DIAGNOSIS — N1831 Chronic kidney disease, stage 3a: Secondary | ICD-10-CM | POA: Diagnosis not present

## 2022-09-20 DIAGNOSIS — I6522 Occlusion and stenosis of left carotid artery: Secondary | ICD-10-CM

## 2022-09-20 DIAGNOSIS — I639 Cerebral infarction, unspecified: Secondary | ICD-10-CM | POA: Diagnosis not present

## 2022-09-20 HISTORY — PX: ENDARTERECTOMY: SHX5162

## 2022-09-20 LAB — TYPE AND SCREEN
ABO/RH(D): A POS
Antibody Screen: NEGATIVE

## 2022-09-20 LAB — CBC
HCT: 31 % — ABNORMAL LOW (ref 39.0–52.0)
Hemoglobin: 10.8 g/dL — ABNORMAL LOW (ref 13.0–17.0)
MCH: 31.3 pg (ref 26.0–34.0)
MCHC: 34.8 g/dL (ref 30.0–36.0)
MCV: 89.9 fL (ref 80.0–100.0)
Platelets: 142 10*3/uL — ABNORMAL LOW (ref 150–400)
RBC: 3.45 MIL/uL — ABNORMAL LOW (ref 4.22–5.81)
RDW: 14.7 % (ref 11.5–15.5)
WBC: 3.7 10*3/uL — ABNORMAL LOW (ref 4.0–10.5)
nRBC: 0 % (ref 0.0–0.2)

## 2022-09-20 LAB — SURGICAL PCR SCREEN
MRSA, PCR: NEGATIVE
Staphylococcus aureus: NEGATIVE

## 2022-09-20 LAB — ABO/RH: ABO/RH(D): A POS

## 2022-09-20 LAB — APTT: aPTT: 89 seconds — ABNORMAL HIGH (ref 24–36)

## 2022-09-20 LAB — POCT ACTIVATED CLOTTING TIME: Activated Clotting Time: 269 seconds

## 2022-09-20 LAB — HEPARIN LEVEL (UNFRACTIONATED): Heparin Unfractionated: 0.23 IU/mL — ABNORMAL LOW (ref 0.30–0.70)

## 2022-09-20 SURGERY — ENDARTERECTOMY, CAROTID
Anesthesia: General | Site: Neck | Laterality: Left

## 2022-09-20 MED ORDER — LIDOCAINE-EPINEPHRINE (PF) 1 %-1:200000 IJ SOLN
INTRAMUSCULAR | Status: AC
  Filled 2022-09-20: qty 30

## 2022-09-20 MED ORDER — HEMOSTATIC AGENTS (NO CHARGE) OPTIME
TOPICAL | Status: DC | PRN
  Administered 2022-09-20: 1 via TOPICAL

## 2022-09-20 MED ORDER — SUGAMMADEX SODIUM 500 MG/5ML IV SOLN
INTRAVENOUS | Status: AC
  Filled 2022-09-20: qty 5

## 2022-09-20 MED ORDER — EPHEDRINE SULFATE-NACL 50-0.9 MG/10ML-% IV SOSY
PREFILLED_SYRINGE | INTRAVENOUS | Status: DC | PRN
  Administered 2022-09-20: 5 mg via INTRAVENOUS
  Administered 2022-09-20: 10 mg via INTRAVENOUS
  Administered 2022-09-20: 5 mg via INTRAVENOUS

## 2022-09-20 MED ORDER — PROPOFOL 10 MG/ML IV BOLUS
INTRAVENOUS | Status: DC | PRN
  Administered 2022-09-20: 30 mg via INTRAVENOUS
  Administered 2022-09-20: 120 mg via INTRAVENOUS

## 2022-09-20 MED ORDER — SODIUM CHLORIDE 0.9 % IV SOLN: INTRAVENOUS | Status: DC | PRN

## 2022-09-20 MED ORDER — LIDOCAINE 2% (20 MG/ML) 5 ML SYRINGE
INTRAMUSCULAR | Status: DC | PRN
  Administered 2022-09-20: 40 mg via INTRAVENOUS

## 2022-09-20 MED ORDER — SODIUM CHLORIDE 0.9 % IV SOLN: INTRAVENOUS | Status: DC

## 2022-09-20 MED ORDER — SUGAMMADEX SODIUM 200 MG/2ML IV SOLN
INTRAVENOUS | Status: DC | PRN
  Administered 2022-09-20: 250 mg via INTRAVENOUS

## 2022-09-20 MED ORDER — DEXTRAN 40 IN SALINE 10-0.9 % IV SOLN
INTRAVENOUS | Status: AC | PRN
  Administered 2022-09-20: 500 mL

## 2022-09-20 MED ORDER — MIDAZOLAM HCL 2 MG/2ML IJ SOLN
INTRAMUSCULAR | Status: AC
  Filled 2022-09-20: qty 2

## 2022-09-20 MED ORDER — PROPOFOL 10 MG/ML IV BOLUS
INTRAVENOUS | Status: AC
  Filled 2022-09-20: qty 20

## 2022-09-20 MED ORDER — LIDOCAINE HCL (PF) 1 % IJ SOLN
INTRAMUSCULAR | Status: AC
  Filled 2022-09-20: qty 30

## 2022-09-20 MED ORDER — SODIUM CHLORIDE 0.9 % IV SOLN
0.1500 ug/kg/min | INTRAVENOUS | Status: DC
  Administered 2022-09-20: .2 ug/kg/min via INTRAVENOUS
  Administered 2022-09-20: .15 ug/kg/min via INTRAVENOUS
  Administered 2022-09-20: .2 ug/kg/min via INTRAVENOUS
  Filled 2022-09-20 (×2): qty 2000

## 2022-09-20 MED ORDER — CEFAZOLIN SODIUM-DEXTROSE 2-4 GM/100ML-% IV SOLN
2.0000 g | Freq: Three times a day (TID) | INTRAVENOUS | Status: AC
  Administered 2022-09-20 (×2): 2 g via INTRAVENOUS
  Filled 2022-09-20 (×3): qty 100

## 2022-09-20 MED ORDER — SODIUM CHLORIDE 0.9% IV SOLUTION: Freq: Once | INTRAVENOUS | Status: DC

## 2022-09-20 MED ORDER — HEPARIN 6000 UNIT IRRIGATION SOLUTION
Status: DC | PRN
  Administered 2022-09-20: 1

## 2022-09-20 MED ORDER — LACTATED RINGERS IV SOLN: INTRAVENOUS | Status: DC | PRN

## 2022-09-20 MED ORDER — DEXAMETHASONE SODIUM PHOSPHATE 10 MG/ML IJ SOLN
INTRAMUSCULAR | Status: AC
  Filled 2022-09-20: qty 1

## 2022-09-20 MED ORDER — ROCURONIUM BROMIDE 100 MG/10ML IV SOLN
INTRAVENOUS | Status: DC | PRN
  Administered 2022-09-20: 20 mg via INTRAVENOUS
  Administered 2022-09-20: 10 mg via INTRAVENOUS
  Administered 2022-09-20: 60 mg via INTRAVENOUS

## 2022-09-20 MED ORDER — PROTAMINE SULFATE 10 MG/ML IV SOLN
INTRAVENOUS | Status: DC | PRN
  Administered 2022-09-20: 50 mg via INTRAVENOUS

## 2022-09-20 MED ORDER — LIDOCAINE 2% (20 MG/ML) 5 ML SYRINGE
INTRAMUSCULAR | Status: AC
  Filled 2022-09-20: qty 5

## 2022-09-20 MED ORDER — FENTANYL CITRATE (PF) 250 MCG/5ML IJ SOLN
INTRAMUSCULAR | Status: AC
  Filled 2022-09-20: qty 5

## 2022-09-20 MED ORDER — ONDANSETRON HCL 4 MG/2ML IJ SOLN
INTRAMUSCULAR | Status: DC | PRN
  Administered 2022-09-20: 4 mg via INTRAVENOUS

## 2022-09-20 MED ORDER — HEPARIN 6000 UNIT IRRIGATION SOLUTION
Status: AC
  Filled 2022-09-20: qty 500

## 2022-09-20 MED ORDER — HYDROMORPHONE HCL 1 MG/ML IJ SOLN
0.5000 mg | INTRAMUSCULAR | Status: DC | PRN
  Administered 2022-09-20: 0.5 mg via INTRAVENOUS

## 2022-09-20 MED ORDER — LIDOCAINE-EPINEPHRINE (PF) 1 %-1:200000 IJ SOLN
INTRAMUSCULAR | Status: DC | PRN
  Administered 2022-09-20: 10 mL

## 2022-09-20 MED ORDER — ONDANSETRON HCL 4 MG/2ML IJ SOLN
INTRAMUSCULAR | Status: AC
  Filled 2022-09-20: qty 2

## 2022-09-20 MED ORDER — METOPROLOL TARTRATE 5 MG/5ML IV SOLN: 2.0000 mg | INTRAVENOUS | Status: DC | PRN

## 2022-09-20 MED ORDER — PROTAMINE SULFATE 10 MG/ML IV SOLN
INTRAVENOUS | Status: AC
  Filled 2022-09-20: qty 5

## 2022-09-20 MED ORDER — MAGNESIUM SULFATE 2 GM/50ML IV SOLN: 2.0000 g | Freq: Every day | INTRAVENOUS | Status: DC | PRN

## 2022-09-20 MED ORDER — HYDROMORPHONE HCL 1 MG/ML IJ SOLN
INTRAMUSCULAR | Status: AC
  Filled 2022-09-20: qty 1

## 2022-09-20 MED ORDER — PHENYLEPHRINE HCL-NACL 20-0.9 MG/250ML-% IV SOLN
INTRAVENOUS | Status: DC | PRN
  Administered 2022-09-20: 20 ug/min via INTRAVENOUS

## 2022-09-20 MED ORDER — THROMBIN (RECOMBINANT) 20000 UNITS EX SOLR
CUTANEOUS | Status: AC
  Filled 2022-09-20: qty 20000

## 2022-09-20 MED ORDER — DEXAMETHASONE SODIUM PHOSPHATE 10 MG/ML IJ SOLN
INTRAMUSCULAR | Status: DC | PRN
  Administered 2022-09-20: 8 mg via INTRAVENOUS

## 2022-09-20 MED ORDER — ROCURONIUM BROMIDE 10 MG/ML (PF) SYRINGE
PREFILLED_SYRINGE | INTRAVENOUS | Status: AC
  Filled 2022-09-20: qty 10

## 2022-09-20 MED ORDER — ONDANSETRON HCL 4 MG/2ML IJ SOLN
4.0000 mg | Freq: Four times a day (QID) | INTRAMUSCULAR | Status: DC | PRN
  Filled 2022-09-20: qty 2

## 2022-09-20 MED ORDER — CLEVIDIPINE BUTYRATE 0.5 MG/ML IV EMUL: INTRAVENOUS | Status: DC | PRN

## 2022-09-20 MED ORDER — SODIUM CHLORIDE 0.9 % IV SOLN: 500.0000 mL | Freq: Once | INTRAVENOUS | Status: DC | PRN

## 2022-09-20 MED ORDER — 0.9 % SODIUM CHLORIDE (POUR BTL) OPTIME
TOPICAL | Status: DC | PRN
  Administered 2022-09-20: 2000 mL

## 2022-09-20 MED ORDER — CEFAZOLIN SODIUM-DEXTROSE 2-3 GM-%(50ML) IV SOLR
INTRAVENOUS | Status: DC | PRN
  Administered 2022-09-20: 2 g via INTRAVENOUS

## 2022-09-20 MED ORDER — CLEVIDIPINE BUTYRATE 0.5 MG/ML IV EMUL
INTRAVENOUS | Status: DC | PRN
  Administered 2022-09-20: 1 mg/h via INTRAVENOUS

## 2022-09-20 MED ORDER — FENTANYL CITRATE (PF) 100 MCG/2ML IJ SOLN
INTRAMUSCULAR | Status: DC | PRN
  Administered 2022-09-20: 50 ug via INTRAVENOUS

## 2022-09-20 MED ORDER — HEPARIN SODIUM (PORCINE) 1000 UNIT/ML IJ SOLN
INTRAMUSCULAR | Status: DC | PRN
  Administered 2022-09-20: 10000 [IU] via INTRAVENOUS

## 2022-09-20 MED ORDER — GUAIFENESIN-DM 100-10 MG/5ML PO SYRP: 15.0000 mL | ORAL_SOLUTION | ORAL | Status: DC | PRN

## 2022-09-20 MED ORDER — SODIUM CHLORIDE 0.9 % IV SOLN: 0.1500 ug/kg/min | INTRAVENOUS | Status: DC

## 2022-09-20 MED ORDER — PANTOPRAZOLE SODIUM 40 MG PO TBEC
40.0000 mg | DELAYED_RELEASE_TABLET | Freq: Every day | ORAL | Status: DC
  Administered 2022-09-20 – 2022-09-29 (×10): 40 mg via ORAL
  Filled 2022-09-20 (×10): qty 1

## 2022-09-20 MED ORDER — POTASSIUM CHLORIDE CRYS ER 20 MEQ PO TBCR: 20.0000 meq | EXTENDED_RELEASE_TABLET | Freq: Every day | ORAL | Status: DC | PRN

## 2022-09-20 MED ORDER — HEPARIN SODIUM (PORCINE) 1000 UNIT/ML IJ SOLN
INTRAMUSCULAR | Status: AC
  Filled 2022-09-20: qty 10

## 2022-09-20 MED ORDER — LABETALOL HCL 5 MG/ML IV SOLN: 10.0000 mg | INTRAVENOUS | Status: DC | PRN

## 2022-09-20 MED ORDER — PHENOL 1.4 % MT LIQD: 1.0000 | OROMUCOSAL | Status: DC | PRN

## 2022-09-20 SURGICAL SUPPLY — 53 items
BAG COUNTER SPONGE SURGICOUNT (BAG) ×1 IMPLANT
BAG DECANTER FOR FLEXI CONT (MISCELLANEOUS) ×1 IMPLANT
CANISTER SUCT 3000ML PPV (MISCELLANEOUS) ×1 IMPLANT
CANNULA VESSEL 3MM 2 BLNT TIP (CANNULA) ×3 IMPLANT
CATH ROBINSON RED A/P 18FR (CATHETERS) ×1 IMPLANT
CLIP VESOCCLUDE MED 24/CT (CLIP) ×1 IMPLANT
CLIP VESOCCLUDE SM WIDE 24/CT (CLIP) ×1 IMPLANT
COVER PROBE W GEL 5X96 (DRAPES) IMPLANT
DERMABOND ADVANCED .7 DNX12 (GAUZE/BANDAGES/DRESSINGS) ×1 IMPLANT
DRAIN CHANNEL 15F RND FF W/TCR (WOUND CARE) IMPLANT
ELECT REM PT RETURN 9FT ADLT (ELECTROSURGICAL) ×1
ELECTRODE REM PT RTRN 9FT ADLT (ELECTROSURGICAL) ×1 IMPLANT
EVACUATOR SILICONE 100CC (DRAIN) IMPLANT
GLOVE BIO SURGEON STRL SZ 6.5 (GLOVE) IMPLANT
GLOVE BIO SURGEON STRL SZ7 (GLOVE) IMPLANT
GLOVE BIO SURGEON STRL SZ7.5 (GLOVE) ×1 IMPLANT
GLOVE BIOGEL PI IND STRL 6.5 (GLOVE) IMPLANT
GLOVE BIOGEL PI IND STRL 7.0 (GLOVE) IMPLANT
GLOVE BIOGEL PI IND STRL 7.5 (GLOVE) IMPLANT
GLOVE BIOGEL PI IND STRL 8 (GLOVE) ×1 IMPLANT
GLOVE ECLIPSE 7.0 STRL STRAW (GLOVE) IMPLANT
GLOVE SURG POLY ORTHO LF SZ7.5 (GLOVE) IMPLANT
GLOVE SURG UNDER LTX SZ8 (GLOVE) ×1 IMPLANT
GOWN STRL REUS W/ TWL LRG LVL3 (GOWN DISPOSABLE) ×3 IMPLANT
GOWN STRL REUS W/TWL LRG LVL3 (GOWN DISPOSABLE) ×3
HEMOSTAT HEMOBLAST BELLOWS (HEMOSTASIS) IMPLANT
KIT BASIN OR (CUSTOM PROCEDURE TRAY) ×1 IMPLANT
KIT SHUNT ARGYLE CAROTID ART 6 (VASCULAR PRODUCTS) IMPLANT
KIT TURNOVER KIT B (KITS) ×1 IMPLANT
NDL HYPO 25GX1X1/2 BEV (NEEDLE) ×1 IMPLANT
NEEDLE HYPO 25GX1X1/2 BEV (NEEDLE) ×1 IMPLANT
NS IRRIG 1000ML POUR BTL (IV SOLUTION) ×2 IMPLANT
PACK CAROTID (CUSTOM PROCEDURE TRAY) ×1 IMPLANT
PAD ARMBOARD 7.5X6 YLW CONV (MISCELLANEOUS) ×2 IMPLANT
PATCH VASC XENOSURE 1CMX6CM (Vascular Products) ×1 IMPLANT
PATCH VASC XENOSURE 1X6 (Vascular Products) IMPLANT
POSITIONER HEAD DONUT 9IN (MISCELLANEOUS) ×1 IMPLANT
SET WALTER ACTIVATION W/DRAPE (SET/KITS/TRAYS/PACK) IMPLANT
SHEATH PROBE COVER 6X72 (BAG) IMPLANT
SHUNT CAROTID BYPASS 10 (VASCULAR PRODUCTS) IMPLANT
SHUNT CAROTID BYPASS 12 (VASCULAR PRODUCTS) IMPLANT
SPONGE SURGIFOAM ABS GEL 100 (HEMOSTASIS) IMPLANT
SUT MNCRL AB 4-0 PS2 18 (SUTURE) ×1 IMPLANT
SUT PROLENE 6 0 BV (SUTURE) ×2 IMPLANT
SUT SILK 2 0 PERMA HAND 18 BK (SUTURE) IMPLANT
SUT SILK 3 0 (SUTURE) ×1
SUT SILK 3-0 18XBRD TIE 12 (SUTURE) IMPLANT
SUT VIC AB 3-0 SH 27 (SUTURE) ×1
SUT VIC AB 3-0 SH 27X BRD (SUTURE) ×1 IMPLANT
SYR 20ML LL LF (SYRINGE) ×1 IMPLANT
SYR CONTROL 10ML LL (SYRINGE) ×1 IMPLANT
TOWEL GREEN STERILE (TOWEL DISPOSABLE) ×1 IMPLANT
WATER STERILE IRR 1000ML POUR (IV SOLUTION) ×1 IMPLANT

## 2022-09-20 NOTE — Transfer of Care (Signed)
Immediate Anesthesia Transfer of Care Note  Patient: Alexander Austin  Procedure(s) Performed: ENDARTERECTOMY CAROTID WITH 1 CM X 6CM BIOLOGIC PATCH. (Left: Neck)  Patient Location: PACU  Anesthesia Type:General  Level of Consciousness: alert , oriented, drowsy and patient cooperative  Airway & Oxygen Therapy: Patient Spontanous Breathing and Patient connected to nasal cannula oxygen  Post-op Assessment: Report given to RN and Post -op Vital signs reviewed and stable  Post vital signs: Reviewed and stable  Last Vitals:  Vitals Value Taken Time  BP 129/50 09/20/22 1045  Temp    Pulse 60 09/20/22 1050  Resp 20 09/20/22 1050  SpO2 91 % 09/20/22 1050  Vitals shown include unvalidated device data.  Last Pain:  Vitals:   09/20/22 0332  TempSrc: Oral  PainSc:          Complications: No notable events documented.

## 2022-09-20 NOTE — Op Note (Signed)
NAME: Alexander Austin    MRN: [id number] DOB: 1946/11/02    DATE OF OPERATION: 09/20/2022  PREOP DIAGNOSIS:    Symptomatic left carotid stenosis  POSTOP DIAGNOSIS:    Same  PROCEDURE:    Left carotid endarterectomy with bovine pericardial patch angioplasty  SURGEON: Judeth Cornfield. Scot Dock, MD  ASSIST: Leontine Locket, PA  ANESTHESIA: General  EBL: 50 cc  INDICATIONS:    Alexander Austin is a 76 y.o. male who had a left parietal stroke.  He had a significant plaque in the left carotid artery by CT and duplex.  Left carotid endarterectomy was recommended in order to lower his risk of future stroke.  FINDINGS:   The patient had a hemorrhagic plaque in the left carotid that was 90% narrowed.  The plaque extended quite high but I ultimately was able to get above the plaque.  TECHNIQUE:   Patient was taken to the operating room and received a general anesthetic.  Arterial line had been placed by anesthesia.  The left neck was prepped and draped in usual sterile fashion.  Of note he did have a history of some spinal stenosis and therefore we were very careful not to hyperextend the neck.  An incision was made along the anterior border of the sternocleidomastoid on the left and the dissection carried down to the common carotid artery.  The patient was heparinized at this point and ACT was monitored throughout the procedure.  The common carotid artery was controlled with an umbilical tape.  The facial vein was divided between 2-0 silk ties.  The dissection was carried up to the bifurcation and then onto the internal carotid artery.  I had to fully mobilized the hypoglossal nerve as the stenosis extended quite high.  There was a calcific plaque distally which I had seen on CT and we were able to get above this.  The superior thyroid artery and external carotid arteries were also controlled.  Next clamps were placed on the internal carotid artery, then the common carotid artery, then the external  carotid artery.  A longitudinal arteriotomy was made in the common carotid artery.  This was extended through the plaque into the internal carotid artery.  I placed a 10 shunt initially but as the plaque was extremely high I felt I could have better visualization without the shunt.  He had excellent backbleeding and for this reason the shunt was removed.  An endarterectomy plane was established proximally and the plaque was sharply divided.  Eversion endarterectomy was performed of the external carotid artery.  Distally there was a nice tapering the plaque and no tacking sutures were required.  The artery was irrigated with copious amounts of heparin and dextran and all loose debris removed.  The bovine pericardial patch was then sewn using continuous 6-0 Prolene suture.  Prior to completing the patch closure the arteries were backbled and flushed appropriately and the anastomosis completed.  Flow was reestablished first to the external carotid artery and then to the internal carotid artery.  Hemostasis was obtained in the wound.  The wound was then closed with a deep layer of 3-0 Vicryl.  The platysma was closed with running 3-0 Vicryl.  The skin was closed with 4-0 Monocryl.  Dermabond was applied.  The patient tolerated the procedure well was transferred recovery in stable condition.  All needle and sponge counts were correct.  He awoke neurologically intact.   Given the complexity of the case,  the assistant was necessary in  order to expedient the procedure and safely perform the technical aspects of the operation.  The assistant provided traction and countertraction to assist with exposure of the common carotid artery, external carotid artery, and internal carotid artery.  They also assisted with suture ligatures and dividing the facial vein and multiple small venous branches tethering the hypoglossal nerve. The assistant also played a critical role in placing the shunt safely.  In addition they were  necessary to provide adequate traction and countertraction to perform a precise endarterectomy and precise closure.  These skills, especially following the Prolene suture for the anastomosis, could not have been adequately performed by a scrub tech assistant.     Deitra Mayo, MD, FACS [facility]  DATE OF DICTATION:   09/20/2022

## 2022-09-20 NOTE — Progress Notes (Signed)
PT Cancellation Note  Patient Details Name: [name] MRN: [id number] DOB: 10-Jan-1946   Cancelled Treatment:    Reason Eval/Treat Not Completed: Patient at procedure or test/unavailable. Pt off the unit at OR undergoing L carotid endarterectomy. Acute PT to return as able and appropriate to progress mobility.  Kittie Plater, PT, DPT Acute Rehabilitation Services Secure chat preferred Office #: [phone]    Berline Lopes 09/20/2022, 7:19 AM

## 2022-09-20 NOTE — Anesthesia Procedure Notes (Signed)
Procedure Name: Intubation Date/Time: 09/20/2022 8:04 AM  Performed by: Gwyndolyn Saxon, CRNAPre-anesthesia Checklist: Patient identified, Emergency Drugs available, Suction available and Patient being monitored Patient Re-evaluated:Patient Re-evaluated prior to induction Oxygen Delivery Method: Circle system utilized Preoxygenation: Pre-oxygenation with 100% oxygen Induction Type: IV induction Ventilation: Mask ventilation without difficulty and Oral airway inserted - appropriate to patient size Laryngoscope Size: Sabra Heck and 2 Grade View: Grade I Tube type: Oral Tube size: 8.0 mm Number of attempts: 1 Airway Equipment and Method: Stylet Placement Confirmation: ETT inserted through vocal cords under direct vision, positive ETCO2 and breath sounds checked- equal and bilateral Secured at: 23 cm Tube secured with: Tape Dental Injury: Teeth and Oropharynx as per pre-operative assessment  Difficulty Due To: Difficult Airway- due to reduced neck mobility Comments: Pt with cervical stenosis and very limited extension. Edentulous. Easy mask airway with oral airway. Grade I view.

## 2022-09-20 NOTE — Anesthesia Preprocedure Evaluation (Addendum)
Anesthesia Evaluation  Patient identified by MRN, date of birth, ID band Patient awake    Reviewed: Allergy & Precautions, NPO status , Patient's Chart, lab work & pertinent test results  Airway Mallampati: II  TM Distance: >3 FB Neck ROM: Full    Dental  (+) Edentulous Upper, Edentulous Lower   Pulmonary sleep apnea , former smoker,     + decreased breath sounds      Cardiovascular hypertension, + CAD, + CABG and +CHF  + dysrhythmias Atrial Fibrillation + Cardiac Defibrillator  Rhythm:Regular Rate:Normal  Echo: 1. Left ventricular ejection fraction, by estimation, is 50 to 55%. The  left ventricle has low normal function. The left ventricle has no regional  wall motion abnormalities. Left ventricular diastolic parameters are  consistent with Grade II diastolic  dysfunction (pseudonormalization).  2. Right ventricular systolic function is normal. The right ventricular  size is normal. There is normal pulmonary artery systolic pressure.  3. Left atrial size was severely dilated.  4. The mitral valve is grossly normal. No evidence of mitral valve  regurgitation. No evidence of mitral stenosis.  5. The aortic valve was not well visualized. Aortic valve regurgitation  is trivial.    Neuro/Psych PSYCHIATRIC DISORDERS Anxiety Depression CVA, Residual Symptoms    GI/Hepatic Neg liver ROS, GERD  ,  Endo/Other  negative endocrine ROS  Renal/GU Renal disease     Musculoskeletal  (+) Arthritis ,   Abdominal (+) + obese,   Peds  Hematology negative hematology ROS (+)   Anesthesia Other Findings   Reproductive/Obstetrics                            Anesthesia Physical Anesthesia Plan  ASA: 3  Anesthesia Plan: General   Post-op Pain Management:    Induction: Intravenous  PONV Risk Score and Plan: 3 and Ondansetron, Dexamethasone and Treatment may vary due to age or medical  condition  Airway Management Planned: Oral ETT  Additional Equipment: Arterial line  Intra-op Plan:   Post-operative Plan: Extubation in OR  Informed Consent: I have reviewed the patients History and Physical, chart, labs and discussed the procedure including the risks, benefits and alternatives for the proposed anesthesia with the patient or authorized representative who has indicated his/her understanding and acceptance.     Dental advisory given  Plan Discussed with: CRNA  Anesthesia Plan Comments: (Remi gtt)      Anesthesia Quick Evaluation

## 2022-09-20 NOTE — Progress Notes (Signed)
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  Patient takes Plavix and Eliquis at home. In the ED he was hypertensive blood pressure 160/64 Code stroke was called, CT head showed small cortical/subcortical infarct in the left frontal lobe that was new from 2015.  CTA head and neck showed no large vessel occlusion but had severe stenosis of the origin of left ICA greater than 80%. Patient was not a candidate for TNKase, since he is on Eliquis.  Neurology was consulted    Subjective   Patient seen, s/p left carotid endarterectomy.  Pain adequately controlled.    Assessment/Plan:    CVA -CT head showed small cortical/subcortical infarct within the mid to posterior left frontal lobe, left MCA/ACA watershed territory, new from prior head CT of 09/07/2014 -CT head and neck showed severe stenosis of the origin of left ICA greater than 80%; carotid duplex obtained which shows 60 to 79% left ICA stenosis.   -vascular surgery consulted, patient underwent left carotid endarterectomy today -Continue Lipitor 80 mg daily -MRI brain pending compatibility of his [employer]/ICD pacemaker -Follow hemoglobin A1c, 4.7 lipid profile showed LDL 49 PT/OT/speech therapy -Started back on low-dose Coreg 25 mg p.o. twice daily -Neurology has signed off  Internal carotid artery stenosis, left -Severe stenosis near the origin of left ICA likely more than 80% -Vascular surgery consulted for further recommendations -Patient underwent left carotid endarterectomy today -Can be restarted on edoxaban from tomorrow morning  Chronic kidney disease  stage IIIa -Creatinine is stable  Hypertension -Home antihypertensives on hold for permissive hypertension -Okay for blood pressure less than 220/120 -We will gradually restart home medications -Patient takes amlodipine 10 mg daily, Coreg 25 mg twice daily at home -Started back on low-dose of Coreg 12.5 mg p.o. twice daily -Blood pressure was elevated so he was started back on Coreg 25 p.o. twice daily, amlodipine 10 mg daily   Paroxysmal atrial fibrillation -Apixaban on hold in anticipation for surgery -Vascular surgery recommended to continue with Plavix for now and start Eliquis post op -Cardiology was consulted for preop evaluation -Found out that patient was on Eliquis and primidone and primidone interacts with Eliquis and makes it less effective; which may explain that patient had stroke while being on Eliquis -Eliquis has been discontinued, patient started on heparin per pharmacy by vascular surgery -Plan to start edoxaban and Plavix post op from tomorrow morning  CAD s/p CABG -Continue Plavix  Hypokalemia -Replete  Medications     sodium chloride   Intravenous Once   amLODipine  10 mg Oral Daily   aspirin  81 mg Oral Daily   atorvastatin  80 mg Oral Daily   calcitRIOL  0.25 mcg Oral Daily   carvedilol  25 mg Oral BID WC   colchicine  0.3 mg Oral BID   docusate sodium  100 mg Oral Daily   escitalopram  20 mg Oral QHS   febuxostat  80 mg Oral Daily   HYDROmorphone       pantoprazole  40 mg Oral Daily   primidone  100 mg Oral BID     Data Reviewed:   CBG:  Recent Labs  Lab 09/13/22 1748  GLUCAP 122*    SpO2: 94 % O2 Flow Rate (L/min): 2 L/min    Vitals:   09/20/22 1410 09/20/22 1424 09/20/22 1440 09/20/22 1520  BP: (!) 144/57 (!) 150/69 (!) 143/63 (!) 151/67  Pulse: 60 61 60 62  Resp: '18 19 18 15  '$ Temp:    98 F (36.7 C)  TempSrc:    Oral  SpO2: 94% 94% 95% 94%  Weight:      Height:          Data Reviewed:  Basic Metabolic Panel: Recent  Labs  Lab 09/13/22 1753 09/14/22 0333 09/15/22 0705 09/19/22 0720  NA 142 142 141 138  K 4.9 3.2* 3.8 3.7  CL 108 111 108 109  CO2  --  '26 24 23  '$ GLUCOSE 121* 99 88 85  BUN 28* '21 22 17  '$ CREATININE 1.60* 1.63* 1.47* 1.45*  CALCIUM  --  8.9 9.0 8.2*    CBC: Recent Labs  Lab 09/13/22 1750 09/13/22 1753 09/15/22 0705 09/17/22 1416 09/18/22 0655 09/19/22 0720 09/20/22 0248  WBC 4.3  --  4.0 5.3 5.3 3.9* 3.7*  NEUTROABS 3.3  --   --   --   --   --   --   HGB 12.3*   < > 11.5* 13.3 11.3* 11.0* 10.8*  HCT 36.7*   < > 33.6* 38.4* 33.3* 31.7* 31.0*  MCV 90.8  --  90.8 89.1 91.2 89.3 89.9  PLT 165  --  146* 190 152 147* 142*   < > = values in this interval not displayed.    LFT Recent Labs  Lab 09/14/22 0333  AST 16  ALT 16  ALKPHOS 83  BILITOT 0.9  PROT 5.9*  ALBUMIN 3.3*     Antibiotics: Anti-infectives (From admission, onward)    Start     Dose/Rate Route Frequency Ordered Stop   09/20/22 1400  ceFAZolin (ANCEF) IVPB 2g/100 mL premix        2 g 200 mL/hr over 30 Minutes Intravenous Every 8 hours 09/20/22 1346 09/21/22 0559   09/20/22 0600  ceFAZolin (ANCEF) IVPB 2g/100 mL premix  Status:  Discontinued       Note to Pharmacy: [place] with pt to OR   2 g 200 mL/hr over 30 Minutes Intravenous On call 09/19/22 0726 09/20/22 1506        DVT prophylaxis: Started on heparin per pharmacy  Code Status: Partial code, no intubation mechanical ventilation  Family Communication: No family at bedside   CONSULTS neurology   Objective    Physical Examination:  General-appears in no acute distress Heart-S1-S2, regular, no murmur auscultated Lungs-clear to auscultation bilaterally, no wheezing or crackles auscultated Abdomen-soft, nontender, no organomegaly Extremities-no edema in the lower extremities Neuro-alert, oriented x3, no focal deficit noted  Status is: Inpatient:         Oswald Hillock   [facility] If 7PM-7AM, please contact  night-coverage at [email or web address], Office  [phone]   09/20/2022, 4:23 PM  LOS: 6 days

## 2022-09-20 NOTE — Progress Notes (Signed)
  Day of Surgery Note    Subjective:  says he has a headache not specific to either side.     Vitals:   09/20/22 1125 09/20/22 1140  BP: (!) 124/53 (!) 124/53  Pulse: 60 60  Resp: (!) 22 16  Temp:  98.1 F (36.7 C)  SpO2: 95% 94%    Incisions:   left neck incision  is clean without hematoma Extremities:  strong right hand grip; moving BLE equally Lungs:  non labored Neuro:  tongue is midline   Assessment/Plan:  This is a 76 y.o. male who is s/p  Left carotid endarterectomy  -pt doing well in recovery -left neck incision looks good and neuro in tact -dilaudid for headache -to 4 east later today -if hgb stable and no evidence of bleeding in the am, can restart Eliquis at that time.   -pt will f/u with [facility] in 2-3 weeks on Dr. Scot Dock clinic day - a message has been sent and our office will arrange appt.    Leontine Locket, PA-C 09/20/2022 12:07 PM [phone]

## 2022-09-20 NOTE — Anesthesia Postprocedure Evaluation (Signed)
Anesthesia Post Note  Patient: Alexander Austin  Procedure(s) Performed: ENDARTERECTOMY CAROTID WITH 1 CM X 6CM BIOLOGIC PATCH. (Left: Neck)     Patient location during evaluation: PACU Anesthesia Type: General Level of consciousness: awake and alert Pain management: pain level controlled Vital Signs Assessment: post-procedure vital signs reviewed and stable Respiratory status: spontaneous breathing, nonlabored ventilation, respiratory function stable and patient connected to nasal cannula oxygen Cardiovascular status: blood pressure returned to baseline and stable Postop Assessment: no apparent nausea or vomiting Anesthetic complications: no   No notable events documented.  Last Vitals:  Vitals:   09/20/22 1424 09/20/22 1440  BP: (!) 150/69 (!) 143/63  Pulse: 61 60  Resp: 19 18  Temp:    SpO2: 94% 95%    Last Pain:  Vitals:   09/20/22 1210  TempSrc:   PainSc: Kincaid Nashaly Dorantes

## 2022-09-20 NOTE — Progress Notes (Signed)
Orthopedic Tech Progress Note Patient Details:  Alexander Austin 1946/07/01 [id number]  OR RN called requesting an Benld, so I called in order to [facility] for it to be STAT  Patient ID: Alexander Austin, male   DOB: 23-Feb-1946, 76 y.o.   MRN: [id number]  Janit Pagan 09/20/2022, 2:51 PM

## 2022-09-20 NOTE — Discharge Instructions (Addendum)
[facility]  Discharge Instructions   Carotid Surgery  Please refer to the following instructions for your post-procedure care. Your surgeon or physician assistant will discuss any changes with you.  Activity  You are encouraged to walk as much as you can. You can slowly return to normal activities but must avoid strenuous activity and heavy lifting until your doctor tell you it's okay. Avoid activities such as vacuuming or swinging a golf club. You can drive after one week if you are comfortable and you are no longer taking prescription pain medications. It is normal to feel tired for serval weeks after your surgery. It is also normal to have difficulty with sleep habits, eating, and bowel movements after surgery. These will go away with time.  Bathing/Showering  Shower daily after you go home. Do not soak in a bathtub, hot tub, or swim until the incision heals completely.  Incision Care  Shower every day. Clean your incision with mild soap and water. Pat the area dry with a clean towel. You do not need a bandage unless otherwise instructed. Do not apply any ointments or creams to your incision. You may have skin glue on your incision. Do not peel it off. It will come off on its own in about one week. Your incision may feel thickened and raised for several weeks after your surgery. This is normal and the skin will soften over time.   For Men Only: It's okay to shave around the incision but do not shave the incision itself for 2 weeks. It is common to have numbness under your chin that could last for several months.  Diet  Resume your normal diet. There are no special food restrictions following this procedure. A low fat/low cholesterol diet is recommended for all patients with vascular disease. In order to heal from your surgery, it is CRITICAL to get adequate nutrition. Your body requires vitamins, minerals, and protein. Vegetables are the best source of  vitamins and minerals. Vegetables also provide the perfect balance of protein. Processed food has little nutritional value, so try to avoid this.  Medications  Resume taking all of your medications unless your doctor or physician assistant tells you not to. If your incision is causing pain, you may take over-the- counter pain relievers such as acetaminophen (Tylenol). If you were prescribed a stronger pain medication, please be aware these medications can cause nausea and constipation. Prevent nausea by taking the medication with a snack or meal. Avoid constipation by drinking plenty of fluids and eating foods with a high amount of fiber, such as fruits, vegetables, and grains.  Do not take Tylenol if you are taking prescription pain medications.  Follow Up  Our office will schedule a follow up appointment 2-3 weeks following discharge.  Please call us immediately for any of the following conditions  Increased pain, redness, drainage (pus) from your incision site. Fever of 101 degrees or higher. If you should develop stroke (slurred speech, difficulty swallowing, weakness on one side of your body, loss of vision) you should call 911 and go to the nearest emergency room.  Reduce your risk of vascular disease:  Stop smoking. If you would like help call [facility] at 1-800-QUIT-NOW 434-188-0041) or [place] at 6698289508. Manage your cholesterol Maintain a desired weight Control your diabetes Keep your blood pressure down  If you have any questions, please call the office at [phone].  ------------------------------------------------------------------------------------------------------------------- Information on my medicine - Coumadin   (Warfarin)  Why  was Coumadin prescribed for you? Coumadin was prescribed for you because you have a blood clot or a medical condition that can cause an increased risk of forming blood clots. Blood clots can cause serious health problems by  blocking the flow of blood to the heart, lung, or brain. Coumadin can prevent harmful blood clots from forming. As a reminder your indication for Coumadin is:  Stroke Prevention because of Atrial Fibrillation  What test will check on my response to Coumadin? While on Coumadin (warfarin) you will need to have an INR test regularly to ensure that your dose is keeping you in the desired range. The INR (international normalized ratio) number is calculated from the result of the laboratory test called prothrombin time (PT).  If an INR APPOINTMENT HAS NOT ALREADY BEEN MADE FOR YOU please schedule an appointment to have this lab work done by your health care provider within 7 days. Your INR goal is usually a number between:  2 to 3 or your provider may give you a more narrow range like 2-2.5.  Ask your health care provider during an office visit what your goal INR is.  What  do you need to  know  About  COUMADIN? Take Coumadin (warfarin) exactly as prescribed by your healthcare provider about the same time each day.  DO NOT stop taking without talking to the doctor who prescribed the medication.  Stopping without other blood clot prevention medication to take the place of Coumadin may increase your risk of developing a new clot or stroke.  Get refills before you run out.  What do you do if you miss a dose? If you miss a dose, take it as soon as you remember on the same day then continue your regularly scheduled regimen the next day.  Do not take two doses of Coumadin at the same time.  Important Safety Information A possible side effect of Coumadin (Warfarin) is an increased risk of bleeding. You should call your healthcare provider right away if you experience any of the following: Bleeding from an injury or your nose that does not stop. Unusual colored urine (red or dark brown) or unusual colored stools (red or black). Unusual bruising for unknown reasons. A serious fall or if you hit your head (even  if there is no bleeding).  Some foods or medicines interact with Coumadin (warfarin) and might alter your response to warfarin. To help avoid this: Eat a balanced diet, maintaining a consistent amount of Vitamin K. Notify your provider about major diet changes you plan to make. Avoid alcohol or limit your intake to 1 drink for women and 2 drinks for men per day. (1 drink is 5 oz. wine, 12 oz. beer, or 1.5 oz. liquor.)  Make sure that ANY health care provider who prescribes medication for you knows that you are taking Coumadin (warfarin).  Also make sure the healthcare provider who is monitoring your Coumadin knows when you have started a new medication including herbals and non-prescription products.  Coumadin (Warfarin)  Major Drug Interactions  Increased Warfarin Effect Decreased Warfarin Effect  Alcohol (large quantities) Antibiotics (esp. Septra/Bactrim, Flagyl, Cipro) Amiodarone (Cordarone) Aspirin (ASA) Cimetidine (Tagamet) Megestrol (Megace) NSAIDs (ibuprofen, naproxen, etc.) Piroxicam (Feldene) Propafenone (Rythmol SR) Propranolol (Inderal) Isoniazid (INH) Posaconazole (Noxafil) Barbiturates (Phenobarbital) Carbamazepine (Tegretol) Chlordiazepoxide (Librium) Cholestyramine (Questran) Griseofulvin Oral Contraceptives Rifampin Sucralfate (Carafate) Vitamin K   Coumadin (Warfarin) Major Herbal Interactions  Increased Warfarin Effect Decreased Warfarin Effect  Garlic Ginseng Ginkgo biloba Coenzyme Q10 Green tea St.  John's wort    Coumadin (Warfarin) FOOD Interactions  Eat a consistent number of servings per week of foods HIGH in Vitamin K (1 serving =  cup)  Collards (cooked, or boiled & drained) Kale (cooked, or boiled & drained) Mustard greens (cooked, or boiled & drained) Parsley *serving size only =  cup Spinach (cooked, or boiled & drained) Swiss chard (cooked, or boiled & drained) Turnip greens (cooked, or boiled & drained)  Eat a consistent number  of servings per week of foods MEDIUM-HIGH in Vitamin K (1 serving = 1 cup)  Asparagus (cooked, or boiled & drained) Broccoli (cooked, boiled & drained, or raw & chopped) Brussel sprouts (cooked, or boiled & drained) *serving size only =  cup Lettuce, raw (green leaf, endive, romaine) Spinach, raw Turnip greens, raw & chopped   These websites have more information on Coumadin (warfarin):  FailFactory.se; VeganReport.com.au;

## 2022-09-20 NOTE — Progress Notes (Signed)
PHARMACIST LIPID MONITORING   Alexander Austin is a 76 y.o. male admitted on 09/13/2022 with CVA.  Pharmacy has been consulted to optimize lipid-lowering therapy with the indication of secondary prevention for clinical ASCVD.  Recent Labs:  Lipid Panel (last 6 months):   Lab Results  Component Value Date   CHOL 95 09/14/2022   TRIG 75 09/14/2022   HDL 31 (L) 09/14/2022   CHOLHDL 3.1 09/14/2022   VLDL 15 09/14/2022   LDLCALC 49 09/14/2022    Hepatic function panel (last 6 months):   Lab Results  Component Value Date   AST 16 09/14/2022   ALT 16 09/14/2022   ALKPHOS 83 09/14/2022   BILITOT 0.9 09/14/2022    SCr (since admission):   Serum creatinine: 1.45 mg/dL (H) 09/19/22 0720 Estimated creatinine clearance: 56.1 mL/min (A)  Current therapy and lipid therapy tolerance Current lipid-lowering therapy: Atorvastatin Previous lipid-lowering therapies (if applicable):  Documented or reported allergies or intolerances to lipid-lowering therapies (if applicable):   Assessment:   Patient prefers no changes in lipid-lowering therapy at this time due to LDL already below goal  Plan:    1.Statin intensity (high intensity recommended for all patients regardless of the LDL):  No statin changes. The patient is already on a high intensity statin.  2.Add ezetimibe (if any one of the following):   Not indicated at this time.  3.Refer to lipid clinic:   No  4.Follow-up with:  Primary care provider - Windy Fast, MD  5.Follow-up labs after discharge:  No changes in lipid therapy, repeat a lipid panel in one year.      Onnie Boer, PharmD, BCIDP, AAHIVP, CPP Infectious Disease Pharmacist 09/20/2022 3:43 PM

## 2022-09-20 NOTE — Interval H&P Note (Signed)
History and Physical Interval Note:  09/20/2022 6:37 AM  Alexander Austin  has presented today for surgery, with the diagnosis of Left carotid artery stenosis.  The various methods of treatment have been discussed with the patient and family. After consideration of risks, benefits and other options for treatment, the patient has consented to  Procedure(s): ENDARTERECTOMY CAROTID (Left) as a surgical intervention.  The patient's history has been reviewed, patient examined, no change in status, stable for surgery.  I have reviewed the patient's chart and labs.  Questions were answered to the patient's satisfaction.     Deitra Mayo

## 2022-09-20 NOTE — Anesthesia Procedure Notes (Signed)
Arterial Line Insertion Start/End10/01/2022 7:25 AM, 09/20/2022 7:30 AM Performed by: Effie Berkshire, MD, Gwyndolyn Saxon, CRNA, CRNA  Patient location: Pre-op. Preanesthetic checklist: patient identified, IV checked, site marked, risks and benefits discussed, surgical consent, monitors and equipment checked, pre-op evaluation, timeout performed and anesthesia consent Lidocaine 1% used for infiltration Left, radial was placed Catheter size: 20 G Hand hygiene performed , maximum sterile barriers used  and Seldinger technique used Allen's test indicative of satisfactory collateral circulation Attempts: 1 Procedure performed without using ultrasound guided technique. Following insertion, dressing applied and Biopatch. Post procedure assessment: normal  Patient tolerated the procedure well with no immediate complications.

## 2022-09-21 ENCOUNTER — Encounter (HOSPITAL_COMMUNITY): Payer: Self-pay | Admitting: Vascular Surgery

## 2022-09-21 ENCOUNTER — Other Ambulatory Visit (HOSPITAL_COMMUNITY): Payer: Self-pay

## 2022-09-21 ENCOUNTER — Inpatient Hospital Stay (HOSPITAL_COMMUNITY): Payer: No Typology Code available for payment source

## 2022-09-21 DIAGNOSIS — I639 Cerebral infarction, unspecified: Secondary | ICD-10-CM | POA: Diagnosis not present

## 2022-09-21 DIAGNOSIS — I10 Essential (primary) hypertension: Secondary | ICD-10-CM | POA: Diagnosis not present

## 2022-09-21 DIAGNOSIS — Z95810 Presence of automatic (implantable) cardiac defibrillator: Secondary | ICD-10-CM | POA: Diagnosis not present

## 2022-09-21 DIAGNOSIS — N1831 Chronic kidney disease, stage 3a: Secondary | ICD-10-CM | POA: Diagnosis not present

## 2022-09-21 LAB — BASIC METABOLIC PANEL
Anion gap: 7 (ref 5–15)
BUN: 27 mg/dL — ABNORMAL HIGH (ref 8–23)
CO2: 22 mmol/L (ref 22–32)
Calcium: 8.6 mg/dL — ABNORMAL LOW (ref 8.9–10.3)
Chloride: 110 mmol/L (ref 98–111)
Creatinine, Ser: 1.61 mg/dL — ABNORMAL HIGH (ref 0.61–1.24)
GFR, Estimated: 44 mL/min — ABNORMAL LOW (ref >60)
Glucose, Bld: 102 mg/dL — ABNORMAL HIGH (ref 70–99)
Potassium: 4.9 mmol/L (ref 3.5–5.1)
Sodium: 139 mmol/L (ref 135–145)

## 2022-09-21 LAB — CBC
HCT: 30.8 % — ABNORMAL LOW (ref 39.0–52.0)
Hemoglobin: 10.3 g/dL — ABNORMAL LOW (ref 13.0–17.0)
MCH: 31.2 pg (ref 26.0–34.0)
MCHC: 33.4 g/dL (ref 30.0–36.0)
MCV: 93.3 fL (ref 80.0–100.0)
Platelets: 142 10*3/uL — ABNORMAL LOW (ref 150–400)
RBC: 3.3 MIL/uL — ABNORMAL LOW (ref 4.22–5.81)
RDW: 14.9 % (ref 11.5–15.5)
WBC: 6.1 10*3/uL (ref 4.0–10.5)
nRBC: 0 % (ref 0.0–0.2)

## 2022-09-21 MED ORDER — WARFARIN SODIUM 5 MG PO TABS
5.0000 mg | ORAL_TABLET | Freq: Once | ORAL | Status: AC
  Administered 2022-09-21: 5 mg via ORAL
  Filled 2022-09-21: qty 1

## 2022-09-21 MED ORDER — WARFARIN - PHARMACIST DOSING INPATIENT: Freq: Every day | Status: DC

## 2022-09-21 MED ORDER — CLOPIDOGREL BISULFATE 75 MG PO TABS
75.0000 mg | ORAL_TABLET | Freq: Every day | ORAL | Status: DC
  Administered 2022-09-21 – 2022-09-29 (×9): 75 mg via ORAL
  Filled 2022-09-21 (×9): qty 1

## 2022-09-21 MED ORDER — FUROSEMIDE 40 MG PO TABS
40.0000 mg | ORAL_TABLET | Freq: Every day | ORAL | Status: DC
  Administered 2022-09-21: 40 mg via ORAL
  Filled 2022-09-21: qty 1

## 2022-09-21 NOTE — Progress Notes (Signed)
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  Patient takes Plavix and Eliquis at home. In the ED he was hypertensive blood pressure 160/64 Code stroke was called, CT head showed small cortical/subcortical infarct in the left frontal lobe that was new from 2015.  CTA head and neck showed no large vessel occlusion but had severe stenosis of the origin of left ICA greater than 80%. Patient was not a candidate for TNKase, since he is on Eliquis.  Neurology was consulted    Subjective   Patient seen and examined, complains of dyspnea on exertion.  Patient is on Lasix 40 mg twice daily at home which is currently on hold.   Assessment/Plan:    CVA -CT head showed small cortical/subcortical infarct within the mid to posterior left frontal lobe, left MCA/ACA watershed territory, new from prior head CT of 09/07/2014 -CT head and neck showed severe stenosis of the origin of left ICA greater than 80%; carotid duplex obtained which shows 60 to 79% left ICA stenosis.   -vascular surgery consulted, patient underwent left carotid endarterectomy today -Continue Lipitor 80 mg daily -MRI brain pending compatibility of his [employer]/ICD pacemaker -Follow hemoglobin A1c, 4.7 lipid profile showed LDL 49 PT/OT/speech therapy -Started back on low-dose Coreg 25 mg p.o. twice daily -Neurology has signed off  Internal carotid artery stenosis, left -Severe stenosis near the origin of left ICA likely more than 80% -Vascular surgery consulted for further recommendations -Patient underwent left carotid endarterectomy today -Eliquis had  interaction with primidone so Eliquis was not restarted -Edoxaban will require preauthorization from [facility] physician -Discussed with pharmacy patient started on warfarin per pharmacy  Acute on chronic diastolic CHF -Patient takes Lasix 40 mg p.o. twice daily at home which is currently on hold -Chest x-ray obtained today was unremarkable -Echocardiogram from 09/14/2022 showed EF of 50 to 19%, grade 2 diastolic dysfunction -We will start Lasix 40 mg p.o. daily  Chronic kidney disease stage IIIa -Creatinine is stable  Hypertension -Home antihypertensives on hold for permissive hypertension -Okay for blood pressure less than 220/120 -We will gradually restart home medications -Patient takes amlodipine 10 mg daily, Coreg 25 mg twice daily at home -Started back on low-dose of Coreg 12.5 mg p.o. twice daily -Blood pressure was elevated so he was started back on Coreg 25 p.o. twice daily, amlodipine 10 mg daily   Paroxysmal atrial fibrillation -Apixaban on hold in anticipation for surgery -Vascular surgery recommended to continue with Plavix for now and start Eliquis post op -Cardiology was consulted for preop evaluation -Found out that patient was on Eliquis and primidone and primidone interacts with Eliquis and makes it less effective; which may explain that patient had stroke while being on Eliquis -Eliquis has been discontinued, patient started on heparin per pharmacy by vascular surgery -Warfarin and Plavix have been restarted  CAD s/p CABG -Continue Plavix  Hypokalemia -Replete  Medications     sodium chloride   Intravenous Once   amLODipine  10 mg Oral Daily   atorvastatin  80 mg Oral Daily   calcitRIOL  0.25 mcg Oral Daily   carvedilol  25 mg Oral BID WC   clopidogrel  75  mg Oral Daily   colchicine  0.3 mg Oral BID   docusate sodium  100 mg Oral Daily   escitalopram  20 mg Oral QHS   febuxostat  80 mg Oral Daily   pantoprazole  40 mg Oral Daily   primidone  100 mg Oral  BID   warfarin  5 mg Oral ONCE-1600   Warfarin - Pharmacist Dosing Inpatient   Does not apply S3419     Data Reviewed:   CBG:  No results for input(s): "GLUCAP" in the last 168 hours.   SpO2: 93 % O2 Flow Rate (L/min): 2 L/min    Vitals:   09/21/22 0200 09/21/22 0300 09/21/22 0842 09/21/22 1100  BP: (!) 127/58 (!) 127/59 (!) 165/66 (!) 130/55  Pulse: (!) 51 (!) 50 (!) 50 (!) 50  Resp: '17 18 20 18  '$ Temp:  97.8 F (36.6 C) 97.6 F (36.4 C) 97.7 F (36.5 C)  TempSrc:  Oral Oral Oral  SpO2:  93% 93%   Weight:      Height:          Data Reviewed:  Basic Metabolic Panel: Recent Labs  Lab 09/15/22 0705 09/19/22 0720 09/21/22 0500  NA 141 138 139  K 3.8 3.7 4.9  CL 108 109 110  CO2 '24 23 22  '$ GLUCOSE 88 85 102*  BUN 22 17 27*  CREATININE 1.47* 1.45* 1.61*  CALCIUM 9.0 8.2* 8.6*    CBC: Recent Labs  Lab 09/17/22 1416 09/18/22 0655 09/19/22 0720 09/20/22 0248 09/21/22 0500  WBC 5.3 5.3 3.9* 3.7* 6.1  HGB 13.3 11.3* 11.0* 10.8* 10.3*  HCT 38.4* 33.3* 31.7* 31.0* 30.8*  MCV 89.1 91.2 89.3 89.9 93.3  PLT 190 152 147* 142* 142*    LFT No results for input(s): "AST", "ALT", "ALKPHOS", "BILITOT", "PROT", "ALBUMIN" in the last 168 hours.    Antibiotics: Anti-infectives (From admission, onward)    Start     Dose/Rate Route Frequency Ordered Stop   09/20/22 1400  ceFAZolin (ANCEF) IVPB 2g/100 mL premix        2 g 200 mL/hr over 30 Minutes Intravenous Every 8 hours 09/20/22 1346 09/20/22 2301   09/20/22 0600  ceFAZolin (ANCEF) IVPB 2g/100 mL premix  Status:  Discontinued       Note to Pharmacy: Pender with pt to OR   2 g 200 mL/hr over 30 Minutes Intravenous On call 09/19/22 0726 09/20/22 1506        DVT prophylaxis: Started on heparin per pharmacy  Code Status: Partial code, no intubation mechanical ventilation  Family Communication: No family at bedside   CONSULTS neurology   Objective    Physical Examination:  General-appears in no  acute distress Heart-S1-S2, regular, no murmur auscultated Lungs-faint crackles auscultated bilaterally at lung bases Abdomen-soft, nontender, no organomegaly Extremities-no edema in the lower extremities Neuro-alert, oriented x3, no focal deficit noted  Status is: Inpatient:         Oswald Hillock   [facility] If 7PM-7AM, please contact night-coverage at [email or web address], Office  [phone]   09/21/2022, 3:14 PM  LOS: 7 days

## 2022-09-21 NOTE — Progress Notes (Signed)
ANTICOAGULATION CONSULT NOTE - Initial Consult  Pharmacy Consult for warfarin Indication: stroke  Allergies  Allergen Reactions   Isosorbide Nitrate Anaphylaxis    Other reaction(s): Cardiovascular Arrest (ALLERGY/intolerance) Can take Sublingual Nitro.    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   Medical History: Past Medical History:  Diagnosis Date   A-fib [facility])    Anxiety    Benign prostate hyperplasia    Cancer ([place])    Kidney   Chronic kidney disease    Congestive heart failure (CHF) (HCC)    CVA (cerebral infarction)    Depression    Dyspnea    GERD (gastroesophageal reflux disease)    Gout    Hematospermia 01/13/2012   History of myocardial infarction 1993   Hyperlipidemia    Hypertension    Sleep apnea    Stroke ([place])    Vitamin B 12 deficiency    Weakness of left leg 03/28/2020    Medications:  Medications Prior to Admission  Medication Sig Dispense Refill Last Dose   albuterol (VENTOLIN HFA) 108 (90 Base) MCG/ACT inhaler Inhale 2 puffs into the lungs daily as needed for wheezing or shortness of breath.   unknown   amLODipine (NORVASC) 10 MG tablet Take 10 mg by mouth daily.   09/13/2022   apixaban (ELIQUIS) 5 MG TABS tablet Take 5 mg by mouth 2 (two) times daily.    09/13/2022 at 1600   atorvastatin (LIPITOR) 80 MG tablet Take 80 mg by  mouth daily.    09/13/2022   calcitRIOL (ROCALTROL) 0.25 MCG capsule Take 0.25 mcg by mouth daily.    09/13/2022   carbamide peroxide (DEBROX) 6.5 % OTIC solution Place 5-10 drops into both ears 2 (two) times daily.   unknown   carvedilol (COREG) 25 MG tablet Take 25 mg by mouth in the morning and at bedtime.    09/13/2022 at unknown   clopidogrel (PLAVIX) 75 MG tablet Take 75 mg by mouth daily.   09/13/2022 at unknown   colchicine 0.6 MG tablet Take 0.5 tablets (0.3 mg total) by mouth 2 (two) times daily. 30 tablet 3 09/13/2022   Docusate Sodium (DSS) 100 MG CAPS Take 100 mg by mouth daily.   09/13/2022   escitalopram (LEXAPRO) 20 MG tablet Take 20 mg by mouth at bedtime.   09/12/2022   Febuxostat 80 MG TABS Take 80 mg by mouth daily.   09/13/2022   furosemide (LASIX) 40 MG tablet Take 1 tablet (40 mg total) by mouth daily. (Patient taking differently: Take 40 mg by mouth 2 (two) times daily.) 30 tablet 0 09/13/2022   hydrALAZINE (APRESOLINE) 50 MG tablet Take 1 tablet (50 mg total) by mouth 3 (three) times daily. 90 tablet 0 09/13/2022   pantoprazole (PROTONIX) 40 MG tablet Take 40 mg by  mouth daily.    09/13/2022   potassium chloride (KLOR-CON M) 10 MEQ tablet Take 10 mEq by mouth daily.   09/13/2022   primidone (MYSOLINE) 50 MG tablet Take 100 mg by mouth in the morning and at bedtime.    09/13/2022   traZODone (DESYREL) 50 MG tablet Take 25 mg by mouth at bedtime as needed for sleep.   unknown   Vitamin D, Ergocalciferol, (DRISDOL) 1.25 MG (50000 UNIT) CAPS capsule Take 50,000 Units by mouth every 7 (seven) days. Sunday   Past Week   Scheduled:   sodium chloride   Intravenous Once   amLODipine  10 mg Oral Daily   atorvastatin  80 mg Oral Daily   calcitRIOL  0.25 mcg Oral Daily   carvedilol  25 mg Oral BID WC   clopidogrel  75 mg Oral Daily   colchicine  0.3 mg Oral BID   docusate sodium  100 mg Oral Daily   escitalopram  20 mg Oral QHS   febuxostat  80 mg Oral Daily   pantoprazole  40 mg Oral  Daily   primidone  100 mg Oral BID    Assessment: 76 yo male with CVA and was on apixaban PTA for afib. Plans are to change anticoagulation due to apixaban failure. He is noted on primidone which can decrease apixaban levels. The same effect is expected for rivaroxaban. Recommendations on use of dabigatran and edoxaban with primidone vary across sources (caution or avoidance).  Best option to avoid recurrent CVA is likely warfarin.  I discussed options with patient and he is willing to try warfarin.  -INR= 1.5 on 9.25  Goal of Therapy:  INR 2-3 Monitor platelets by anticoagulation protocol: Yes   Plan:  -Warfarin '5mg'$  po today -Daily INR  Hildred Laser, PharmD Clinical Pharmacist **Pharmacist phone directory can now be found on [email or web address] ([email or web address]).  Listed under [place].

## 2022-09-21 NOTE — Progress Notes (Signed)
Physical Therapy Treatment Patient Details Name: Alexander Austin MRN: [id number] DOB: December 28, 1945 Today's Date: 09/21/2022   History of Present Illness Pt is a 76 y/o male who presented with R UE weakness and unsteady gait. CT head showed age indeterminate L frontal infarct, MRI pending if pacemaker compatible. PMH: CHF, CAD s/p CABG x 3, ICD, PPM, a fib, pulmonary hypertension, CKD 4, hx of renal cell cancer s/p nephrectomy.    PT Comments    Pt received supine and agreeable to session with focus on continued gait and transfer training for increased activity tolerance. Session limited by pt bowel urgency, and pt continues to be limited by chronic back pain, decreased activity tolerance, and general weakness. Pt grossly min guard throughout gait with RW and needing up to min assist to power up to standing and for steadying assist during transfers. Pt continues to benefit from skilled PT services to progress toward functional mobility goals.    Recommendations for follow up therapy are one component of a multi-disciplinary discharge planning process, led by the attending physician.  Recommendations may be updated based on patient status, additional functional criteria and insurance authorization.  Follow Up Recommendations  Home health PT     Assistance Recommended at Discharge Intermittent Supervision/Assistance  Patient can return home with the following A little help with bathing/dressing/bathroom;A little help with walking and/or transfers;Assistance with cooking/housework;Help with stairs or ramp for entrance;Assist for transportation   Equipment Recommendations  None recommended by PT    Recommendations for Other Services       Precautions / Restrictions Precautions Precautions: Fall Restrictions Weight Bearing Restrictions: No     Mobility  Bed Mobility Overal bed mobility: Needs Assistance Bed Mobility: Supine to Sit     Supine to sit: Min guard, HOB elevated     General  bed mobility comments: min guard to come to sitting EOB.    Transfers Overall transfer level: Needs assistance Equipment used: Rolling walker (2 wheels) Transfers: Sit to/from Stand Sit to Stand: Min assist           General transfer comment: Min A for lift assist and steadying on initiatl stand and from low commode    Ambulation/Gait Ambulation/Gait assistance: Min guard Gait Distance (Feet): 44 Feet (+30) Assistive device: Rolling walker (2 wheels) Gait Pattern/deviations: Step-through pattern, Decreased stride length, Wide base of support, Trunk flexed Gait velocity: Decreased     General Gait Details: safe with RW, but flexed, distance limited by bathrrom urgency.   Stairs             Wheelchair Mobility    Modified Rankin (Stroke Patients Only)       Balance Overall balance assessment: Needs assistance Sitting-balance support: Feet supported, No upper extremity supported Sitting balance-Leahy Scale: Good     Standing balance support: Bilateral upper extremity supported Standing balance-Leahy Scale: Poor Standing balance comment: BUE for mobility, able to stand statically without UE support and without LOB                            Cognition Arousal/Alertness: Awake/alert Behavior During Therapy: WFL for tasks assessed/performed Overall Cognitive Status: History of cognitive impairments - at baseline                                 General Comments: A and O X4. Decreased safety awareness  Exercises      General Comments General comments (skin integrity, edema, etc.): VSS on RA      Pertinent Vitals/Pain Pain Assessment Pain Assessment: No/denies pain    Home Living                          Prior Function            PT Goals (current goals can now be found in the care plan section) Acute Rehab PT Goals Patient Stated Goal: to go home PT Goal Formulation: With patient Time For Goal  Achievement: 09/28/22    Frequency    Min 4X/week      PT Plan Current plan remains appropriate    Co-evaluation              AM-PAC PT "6 Clicks" Mobility   Outcome Measure  Help needed turning from your back to your side while in a flat bed without using bedrails?: A Little Help needed moving from lying on your back to sitting on the side of a flat bed without using bedrails?: A Little Help needed moving to and from a bed to a chair (including a wheelchair)?: A Little Help needed standing up from a chair using your arms (e.g., wheelchair or bedside chair)?: A Little Help needed to walk in hospital room?: A Little Help needed climbing 3-5 steps with a railing? : A Little 6 Click Score: 18    End of Session Equipment Utilized During Treatment: Gait belt Activity Tolerance: Patient limited by fatigue;Patient tolerated treatment well Patient left: in chair;with call bell/phone within reach;with chair alarm set Nurse Communication: Mobility status PT Visit Diagnosis: Muscle weakness (generalized) (M62.81);Other abnormalities of gait and mobility (R26.89)     Time: 0092-3300 PT Time Calculation (min) (ACUTE ONLY): 30 min  Charges:  $Gait Training: 8-22 mins $Therapeutic Activity: 8-22 mins                     Alexander Austin R. PTA Acute Rehabilitation Services Office: [place] 09/21/2022, 10:54 AM

## 2022-09-21 NOTE — Progress Notes (Signed)
   VASCULAR SURGERY ASSESSMENT & PLAN:   POD 1 LEFT CAROTID ENDARTERECTOMY: Patient is doing well status post left carotid endarterectomy.  He has no focal weakness or paresthesias.  Tongue is midline.  VASCULAR QUALITY INITIATIVE: He is on a statin.  I had held his Plavix preoperatively but have restarted this today.   ANTICOAGULATION: The plan was to start Edoxoban instead of Eliquis postoperatively.  He was on Plavix for cardiac reasons.  For this reason I will stop his aspirin given the high risk for bleeding being on triple therapy.  SUBJECTIVE:   No complaints this morning.  PHYSICAL EXAM:   Vitals:   09/20/22 2341 09/21/22 0000 09/21/22 0200 09/21/22 0300  BP: 134/61 134/61 (!) 127/58 (!) 127/59  Pulse: 61 61 (!) 51 (!) 50  Resp: '19 16 17 18  '$ Temp: 97.8 F (36.6 C)   97.8 F (36.6 C)  TempSrc: Oral   Oral  SpO2: 97%   93%  Weight:      Height:       No focal weakness or paresthesias.  His incision looks fine. On his midline.  LABS:   Lab Results  Component Value Date   WBC 6.1 09/21/2022   HGB 10.3 (L) 09/21/2022   HCT 30.8 (L) 09/21/2022   MCV 93.3 09/21/2022   PLT 142 (L) 09/21/2022   Lab Results  Component Value Date   CREATININE 1.61 (H) 09/21/2022   Lab Results  Component Value Date   INR 1.5 (H) 09/13/2022   CBG (last 3)  No results for input(s): "GLUCAP" in the last 72 hours.  PROBLEM LIST:    Principal Problem:   CVA (cerebral vascular accident) ([place]) Active Problems:   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 (HCC)   PAF (paroxysmal atrial fibrillation) ([place])   Essential hypertension, benign   Chronic kidney disease, stage 3a ([place])   Obesity (BMI 30.0-34.9)   Internal carotid artery stenosis, left   Skin wound from surgical incision   AICD (automatic cardioverter/defibrillator) present   Preoperative cardiovascular examination   CURRENT MEDS:    sodium chloride    Intravenous Once   amLODipine  10 mg Oral Daily   aspirin  81 mg Oral Daily   atorvastatin  80 mg Oral Daily   calcitRIOL  0.25 mcg Oral Daily   carvedilol  25 mg Oral BID WC   colchicine  0.3 mg Oral BID   docusate sodium  100 mg Oral Daily   escitalopram  20 mg Oral QHS   febuxostat  80 mg Oral Daily   pantoprazole  40 mg Oral Daily   primidone  100 mg Oral BID    Deitra Mayo Office: [phone] 09/21/2022

## 2022-09-22 DIAGNOSIS — I63032 Cerebral infarction due to thrombosis of left carotid artery: Secondary | ICD-10-CM | POA: Diagnosis not present

## 2022-09-22 LAB — BASIC METABOLIC PANEL
Anion gap: 7 (ref 5–15)
BUN: 29 mg/dL — ABNORMAL HIGH (ref 8–23)
CO2: 21 mmol/L — ABNORMAL LOW (ref 22–32)
Calcium: 8.4 mg/dL — ABNORMAL LOW (ref 8.9–10.3)
Chloride: 110 mmol/L (ref 98–111)
Creatinine, Ser: 1.67 mg/dL — ABNORMAL HIGH (ref 0.61–1.24)
GFR, Estimated: 42 mL/min — ABNORMAL LOW (ref >60)
Glucose, Bld: 101 mg/dL — ABNORMAL HIGH (ref 70–99)
Potassium: 4 mmol/L (ref 3.5–5.1)
Sodium: 138 mmol/L (ref 135–145)

## 2022-09-22 LAB — PROTIME-INR
INR: 1.2 (ref 0.8–1.2)
Prothrombin Time: 15.3 seconds — ABNORMAL HIGH (ref 11.4–15.2)

## 2022-09-22 MED ORDER — CARVEDILOL 12.5 MG PO TABS
12.5000 mg | ORAL_TABLET | Freq: Two times a day (BID) | ORAL | Status: DC
  Administered 2022-09-22 – 2022-09-29 (×16): 12.5 mg via ORAL
  Filled 2022-09-22 (×16): qty 1

## 2022-09-22 MED ORDER — WARFARIN SODIUM 5 MG PO TABS
5.0000 mg | ORAL_TABLET | Freq: Once | ORAL | Status: AC
  Administered 2022-09-22: 5 mg via ORAL
  Filled 2022-09-22: qty 1

## 2022-09-22 MED ORDER — FUROSEMIDE 40 MG PO TABS
40.0000 mg | ORAL_TABLET | Freq: Two times a day (BID) | ORAL | Status: DC
  Administered 2022-09-22 – 2022-09-24 (×6): 40 mg via ORAL
  Filled 2022-09-22 (×6): qty 1

## 2022-09-22 NOTE — Progress Notes (Addendum)
[facility]:   POD 2 LEFT CAROTID ENDARTERECTOMY: Patient is doing well status post left carotid endarterectomy.  He has no focal weakness or paresthesias.  Tongue is midline.   VASCULAR QUALITY INITIATIVE: He is on a statin and Plavix.    ANTICOAGULATION: The patient is on Coumadin since Edoxaban will require preauthorization from [place].  We do not have the patient on aspirin given the high risk for bleeding on triple therapy.  Gae Gallop, MD 7:48 AM    Subjective  -  No new complaints   Objective (!) 142/56 (!) 50 97.7 F (36.5 C) (Oral) 17 92%  Intake/Output Summary (Last 24 hours) at 09/22/2022 0649 Last data filed at 09/22/2022 0600 Gross per 24 hour  Intake --  Output 350 ml  Net -350 ml   Left neck incision healing well without hematoma No neurologic deficits.  No tongue deviation or facial droop. Lungs non labored breathing Moving all 4 extremities with slight weakness on the right UE.   Assessment/Planning: POD #2 left CEA for symptomatic ICA stenosis  Due to interactions the and that Edoxaban will require preauthorization from [facility] physician He was placed on Coumadin for anticoagulation.  Continue Plavix and Statin daily for maximum medical management.   Stable from a vascular point of view.  F/U will be arranged as an OP with [facility].  Will continue to follow.  Roxy Horseman 09/22/2022 6:49 AM --  Laboratory Lab Results: Recent Labs    09/20/22 0248 09/21/22 0500  WBC 3.7* 6.1  HGB 10.8* 10.3*  HCT 31.0* 30.8*  PLT 142* 142*   BMET Recent Labs    09/21/22 0500 09/22/22 0125  NA 139 138  K 4.9 4.0  CL 110 110  CO2 22 21*  GLUCOSE 102* 101*  BUN 27* 29*  CREATININE 1.61* 1.67*  CALCIUM 8.6* 8.4*    COAG Lab Results  Component Value Date   INR 1.2 09/22/2022   INR 1.5 (H) 09/13/2022   INR 1.1 02/12/2015   No results found for: "PTT"

## 2022-09-22 NOTE — Progress Notes (Signed)
PROGRESS NOTE    Alexander Austin  VQQ:[id number] DOB: [birth year] DOA: 09/13/2022 PCP: Windy Fast, MD     Brief Narrative:  Alexander Austin is a 76 year old male with medical history of chronic systolic heart failure, CAD s/p CABG x3 with ICD and pacemaker, paroxysmal atrial fibrillation on Eliquis, CVA, pulmonary hypertension, CKD stage IV with history of renal cell cancer s/p nephrectomy presented with right-sided weakness.  Patient takes Plavix and Eliquis at home. In the ED he was hypertensive blood pressure 160/64. Code stroke was called, CT head showed small cortical/subcortical infarct in the left frontal lobe that was new from 2015.  CTA head and neck showed no large vessel occlusion but had severe stenosis of the origin of left ICA greater than 80%. Patient was not a candidate for TNKase, since he is on Eliquis.  Neurology was consulted. vascular surgery consulted, patient underwent left carotid endarterectomy.   New events last 24 hours / Subjective: Doing well overall, denies shortness of breath.  Remains on 2.5 L oxygen.  Assessment & Plan:   Principal Problem:   CVA (cerebral vascular accident) ([place]) Active Problems:   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 (HCC)   PAF (paroxysmal atrial fibrillation) ([place])   Essential hypertension, benign   Chronic kidney disease, stage 3a ([place])   Obesity (BMI 30.0-34.9)   Internal carotid artery stenosis, left   Skin wound from surgical incision   AICD (automatic cardioverter/defibrillator) present   Preoperative cardiovascular examination   CVA -CT head showed small cortical/subcortical infarct within the mid to posterior left frontal lobe, left MCA/ACA watershed territory, new from prior head CT of 09/07/2014 -CTA head and neck showed severe stenosis of the origin of left ICA greater than 80%; carotid duplex obtained which shows 60 to 79% left ICA stenosis.   -MRI brain  showed small acute to early subacute left frontoparietal infarct, chronic ischemia with multiple chronic infarcts -Vascular surgery consulted, patient underwent left carotid endarterectomy 10/2 -Neuro surgery signed off -Eliquis had interaction with primidone so Eliquis was not restarted. Edoxaban will require preauthorization from [place]. Thus started on coumadin  -Lipitor    Acute hypoxemic respiratory failure -Patient desatted down to 77% on room air overnight -Currently on 2.5 L nasal cannula O2 on my examination -He does not wear oxygen at baseline  Acute on chronic diastolic CHF -Patient takes Lasix 40 mg p.o. twice daily at home which was on hold -Echocardiogram from 09/14/2022 showed EF of 50 to 43%, grade 2 diastolic dysfunction -Restart home lasix BID    Chronic kidney disease stage IIIa -Stable   Hypertension -Resume amlodipine, Coreg    Paroxysmal atrial fibrillation -Found out that patient was on Eliquis and primidone and primidone interacts with Eliquis and makes it less effective; which may explain that patient had stroke while being on Eliquis. Eliquis has been discontinued -Warfarin and Plavix have been restarted   CAD s/p CABG -Continue Plavix     DVT prophylaxis:  SCD's Start: 09/20/22 1507 warfarin (COUMADIN) tablet 5 mg  Code Status: Partial code, DNI Family Communication: No family at bedside Disposition Plan:  Status is: Inpatient Remains inpatient appropriate because: Continue to wean oxygen   Antimicrobials:  Anti-infectives (From admission, onward)    Start     Dose/Rate Route Frequency Ordered Stop   09/20/22 1400  ceFAZolin (ANCEF) IVPB 2g/100 mL premix        2 g 200 mL/hr over 30 Minutes  Intravenous Every 8 hours 09/20/22 1346 09/20/22 2301   09/20/22 0600  ceFAZolin (ANCEF) IVPB 2g/100 mL premix  Status:  Discontinued       Note to Pharmacy: Send with pt to OR   2 g 200 mL/hr over 30 Minutes Intravenous On call 09/19/22 0726  09/20/22 1506        Objective: Vitals:   09/22/22 0200 09/22/22 0315 09/22/22 0747 09/22/22 1209  BP:  (!) 142/56 (!) 125/58 (!) 118/45  Pulse: (!) 50 (!) 50 (!) 49 64  Resp: '17 17 19 20  '$ Temp:  97.7 F (36.5 C) 97.7 F (36.5 C) 98.1 F (36.7 C)  TempSrc:  Oral Axillary Oral  SpO2: 96% 92% 99% 97%  Weight:      Height:        Intake/Output Summary (Last 24 hours) at 09/22/2022 1253 Last data filed at 09/22/2022 1211 Gross per 24 hour  Intake 250 ml  Output 650 ml  Net -400 ml   Filed Weights   09/13/22 1820  Weight: 112.4 kg    Examination:  General exam: Appears calm and comfortable  Respiratory system: Clear to auscultation. Respiratory effort normal. No respiratory distress. No conversational dyspnea. On 2.5L O2  Cardiovascular system: S1 & S2 heard, RRR. No murmurs. No pedal edema. Gastrointestinal system: Abdomen is nondistended, soft and nontender. Normal bowel sounds heard. Central nervous system: Alert and oriented. No focal neurological deficits. Speech clear. Left sided facial droop at rest, symmetric with smile  Extremities: Symmetric in appearance  Skin: No rashes, lesions or ulcers on exposed skin  Psychiatry: Judgement and insight appear normal. Mood & affect appropriate.   Data Reviewed: I have personally reviewed following labs and imaging studies  CBC: Recent Labs  Lab 09/17/22 1416 09/18/22 0655 09/19/22 0720 09/20/22 0248 09/21/22 0500  WBC 5.3 5.3 3.9* 3.7* 6.1  HGB 13.3 11.3* 11.0* 10.8* 10.3*  HCT 38.4* 33.3* 31.7* 31.0* 30.8*  MCV 89.1 91.2 89.3 89.9 93.3  PLT 190 152 147* 142* 841*   Basic Metabolic Panel: Recent Labs  Lab 09/19/22 0720 09/21/22 0500 09/22/22 0125  NA 138 139 138  K 3.7 4.9 4.0  CL 109 110 110  CO2 23 22 21*  GLUCOSE 85 102* 101*  BUN 17 27* 29*  CREATININE 1.45* 1.61* 1.67*  CALCIUM 8.2* 8.6* 8.4*   GFR: Estimated Creatinine Clearance: 48.7 mL/min (A) (by C-G formula based on SCr of 1.67 mg/dL  (H)). Liver Function Tests: No results for input(s): "AST", "ALT", "ALKPHOS", "BILITOT", "PROT", "ALBUMIN" in the last 168 hours. No results for input(s): "LIPASE", "AMYLASE" in the last 168 hours. No results for input(s): "AMMONIA" in the last 168 hours. Coagulation Profile: Recent Labs  Lab 09/22/22 0125  INR 1.2   Cardiac Enzymes: No results for input(s): "CKTOTAL", "CKMB", "CKMBINDEX", "TROPONINI" in the last 168 hours. BNP (last 3 results) No results for input(s): "PROBNP" in the last 8760 hours. HbA1C: No results for input(s): "HGBA1C" in the last 72 hours. CBG: No results for input(s): "GLUCAP" in the last 168 hours. Lipid Profile: No results for input(s): "CHOL", "HDL", "LDLCALC", "TRIG", "CHOLHDL", "LDLDIRECT" in the last 72 hours. Thyroid Function Tests: No results for input(s): "TSH", "T4TOTAL", "FREET4", "T3FREE", "THYROIDAB" in the last 72 hours. Anemia Panel: No results for input(s): "VITAMINB12", "FOLATE", "FERRITIN", "TIBC", "IRON", "RETICCTPCT" in the last 72 hours. Sepsis Labs: No results for input(s): "PROCALCITON", "LATICACIDVEN" in the last 168 hours.  Recent Results (from the past 240 hour(s))  Resp Panel by  RT-PCR (Flu A&B, Covid) Anterior Nasal Swab     Status: None   Collection Time: 09/13/22  6:31 PM   Specimen: Anterior Nasal Swab  Result Value Ref Range Status   SARS Coronavirus 2 by RT PCR NEGATIVE NEGATIVE Final    Comment: (NOTE) SARS-CoV-2 target nucleic acids are NOT DETECTED.  The SARS-CoV-2 RNA is generally detectable in upper respiratory specimens during the acute phase of infection. The lowest concentration of SARS-CoV-2 viral copies this assay can detect is 138 copies/mL. A negative result does not preclude SARS-Cov-2 infection and should not be used as the sole basis for treatment or other patient management decisions. A negative result may occur with  improper specimen collection/handling, submission of specimen other than  nasopharyngeal swab, presence of viral mutation(s) within the areas targeted by this assay, and inadequate number of viral copies(<138 copies/mL). A negative result must be combined with clinical observations, patient history, and epidemiological information. The expected result is Negative.  Fact Sheet for Patients:  EntrepreneurPulse.com.au  Fact Sheet for Healthcare Providers:  IncredibleEmployment.be  This test is no t yet approved or cleared by the [place] FDA and  has been authorized for detection and/or diagnosis of SARS-CoV-2 by FDA under an Emergency Use Authorization (EUA). This EUA will remain  in effect (meaning this test can be used) for the duration of the COVID-19 declaration under Section 564(b)(1) of the Act, 21 U.S.C.section 360bbb-3(b)(1), unless the authorization is terminated  or revoked sooner.       Influenza A by PCR NEGATIVE NEGATIVE Final   Influenza B by PCR NEGATIVE NEGATIVE Final    Comment: (NOTE) The Xpert Xpress SARS-CoV-2/FLU/RSV plus assay is intended as an aid in the diagnosis of influenza from Nasopharyngeal swab specimens and should not be used as a sole basis for treatment. Nasal washings and aspirates are unacceptable for Xpert Xpress SARS-CoV-2/FLU/RSV testing.  Fact Sheet for Patients: EntrepreneurPulse.com.au  Fact Sheet for Healthcare Providers: IncredibleEmployment.be  This test is not yet approved or cleared by the [place] FDA and has been authorized for detection and/or diagnosis of SARS-CoV-2 by FDA under an Emergency Use Authorization (EUA). This EUA will remain in effect (meaning this test can be used) for the duration of the COVID-19 declaration under Section 564(b)(1) of the Act, 21 U.S.C. section 360bbb-3(b)(1), unless the authorization is terminated or revoked.  Performed at [facility], [place] [address] [place], [place] [address]    Surgical pcr screen     Status: None   Collection Time: 09/20/22  4:42 AM   Specimen: Nasal Mucosa; Nasal Swab  Result Value Ref Range Status   MRSA, PCR NEGATIVE NEGATIVE Final   Staphylococcus aureus NEGATIVE NEGATIVE Final    Comment: (NOTE) The Xpert SA Assay (FDA approved for NASAL specimens in patients 19 years of age and older), is one component of a comprehensive surveillance program. It is not intended to diagnose infection nor to guide or monitor treatment. Performed at [facility], [place] [address], [place], [place] [address]       Radiology Studies: DG Chest [place] 1V same Day  Result Date: 09/21/2022 CLINICAL DATA:  A 76 year old male presents with dyspnea, history of CHF and hypertension with stroke. EXAM: PORTABLE CHEST 1 VIEW COMPARISON:  July 03, 2020 FINDINGS: Multi lead pacer defibrillator remains in place. Power pack over LEFT chest. EKG leads project over the chest. Image rotated slightly to the RIGHT. Cardiomediastinal contours and hilar structures are stable with signs of cardiac  enlargement following median sternotomy for CABG. No lobar consolidation. No visible pneumothorax. Lung volumes are low normal. On limited assessment no acute skeletal findings. IMPRESSION: 1. Low lung volumes without acute cardiopulmonary disease. 2. Stable cardiac enlargement following median sternotomy for CABG with multi lead pacer defibrillator in place. Electronically Signed   By: Zetta Bills M.D.   On: 09/21/2022 14:32      Scheduled Meds:  sodium chloride   Intravenous Once   amLODipine  10 mg Oral Daily   atorvastatin  80 mg Oral Daily   calcitRIOL  0.25 mcg Oral Daily   carvedilol  12.5 mg Oral BID WC   clopidogrel  75 mg Oral Daily   colchicine  0.3 mg Oral BID   docusate sodium  100 mg Oral Daily   escitalopram  20 mg Oral QHS   febuxostat  80 mg Oral Daily   furosemide  40 mg Oral BID   pantoprazole  40 mg Oral Daily   primidone  100 mg Oral BID   warfarin  5  mg Oral ONCE-1600   Warfarin - Pharmacist Dosing Inpatient   Does not apply q1600   Continuous Infusions:  magnesium sulfate bolus IVPB       LOS: 8 days     Dessa Phi, DO [facility] 09/22/2022, 12:53 PM   Available via Epic secure chat 7am-7pm After these hours, please refer to coverage provider listed on [email or web address]

## 2022-09-22 NOTE — Progress Notes (Signed)
SPO2 77-89% on room air. He is sleeping at night with open mouth breathing. 3 LPM of O2 NCL was given. SPO2 is up above 92-98%. HR 49-50 with Atrial paced on the monitor. BP stable. Neurological intact. Left neck incision is dry and clean. His pain is well tolerated. No acute distress is noted. We will continue to monitor.  Kennyth Lose, RN

## 2022-09-22 NOTE — Progress Notes (Addendum)
Mobility Specialist Progress Note:   09/22/22 1529  Mobility  Activity Ambulated with assistance in hallway  Activity Response Tolerated well  Distance Ambulated (ft) 200 ft  $Mobility charge 1 Mobility  Level of Assistance Minimal assist, patient does 75% or more  Assistive Device Four wheel walker   Pt received in chair stating his neck hurts. Willing to walk then get back to bed.MinA to stand from chair then contact guard throughout. Left in bed with call bell in reach and all needs met.   [facility] Surveyor, mining Chat only

## 2022-09-22 NOTE — Progress Notes (Signed)
Physical Therapy Treatment Patient Details Name: Alexander Austin MRN: [id number] DOB: 1946/10/23 Today's Date: 09/22/2022   History of Present Illness Pt is a 76 y/o male who presented with R UE weakness and unsteady gait. CT head showed age indeterminate L frontal infarct, MRI pending if pacemaker compatible. PMH: CHF, CAD s/p CABG x 3, ICD, PPM, a fib, pulmonary hypertension, CKD 4, hx of renal cell cancer s/p nephrectomy.    PT Comments    Pt making good progress this session with session focused on continued gait training and transfers for increased activity tolerance and strength. Pt needing up to min assist for gait with rollator as pt with poor environmental awareness needing cues to not run into obstacles in hall/room and for cues for stepping closer to rollator. Pt with fair self pacing with distance limited secondary to fatigue. Pt continues to be limited by decreased activity tolerance, weakness and decreased balance/postural reactions. Pt continues to benefit from skilled PT services to progress toward functional mobility goals.    Recommendations for follow up therapy are one component of a multi-disciplinary discharge planning process, led by the attending physician.  Recommendations may be updated based on patient status, additional functional criteria and insurance authorization.  Follow Up Recommendations  Home health PT     Assistance Recommended at Discharge Intermittent Supervision/Assistance  Patient can return home with the following A little help with bathing/dressing/bathroom;A little help with walking and/or transfers;Assistance with cooking/housework;Help with stairs or ramp for entrance;Assist for transportation   Equipment Recommendations  None recommended by PT    Recommendations for Other Services       Precautions / Restrictions Precautions Precautions: Fall Restrictions Weight Bearing Restrictions: No     Mobility  Bed Mobility Overal bed mobility:  Needs Assistance Bed Mobility: Supine to Sit     Supine to sit: Min guard, HOB elevated     General bed mobility comments: min guard to come to sitting EOB.    Transfers Overall transfer level: Needs assistance Equipment used: Rollator (4 wheels) Transfers: Sit to/from Stand Sit to Stand: Min assist           General transfer comment: Min A for lift assist and steadying from EOB and from low commode    Ambulation/Gait Ambulation/Gait assistance: Min guard Gait Distance (Feet): 200 Feet Assistive device: Rollator (4 wheels) Gait Pattern/deviations: Step-through pattern, Decreased stride length, Wide base of support, Trunk flexed Gait velocity: Decreased     General Gait Details: felxed trunk throughout needing repeated cues to correct and to move closer to rollator. some difting present needing cues for environmental awareness.   Stairs             Wheelchair Mobility    Modified Rankin (Stroke Patients Only)       Balance Overall balance assessment: Needs assistance Sitting-balance support: Feet supported, No upper extremity supported Sitting balance-Leahy Scale: Good     Standing balance support: Bilateral upper extremity supported Standing balance-Leahy Scale: Poor Standing balance comment: BUE for mobility, able to stand statically without UE support and without LOB                            Cognition Arousal/Alertness: Awake/alert Behavior During Therapy: WFL for tasks assessed/performed Overall Cognitive Status: History of cognitive impairments - at baseline  General Comments: decreased safety awareness        Exercises General Exercises - Lower Extremity Ankle Circles/Pumps: AROM, Right, Left, 10 reps (encouraged repetition throughout day)    General Comments General comments (skin integrity, edema, etc.): VSS on RA      Pertinent Vitals/Pain Pain Assessment Pain Assessment:  Faces Faces Pain Scale: Hurts little more Pain Location: back Pain Descriptors / Indicators: Sore, Tightness Pain Intervention(s): Limited activity within patient's tolerance, Monitored during session, Repositioned    Home Living                          Prior Function            PT Goals (current goals can now be found in the care plan section) Acute Rehab PT Goals PT Goal Formulation: With patient Time For Goal Achievement: 09/28/22    Frequency    Min 4X/week      PT Plan Current plan remains appropriate    Co-evaluation              AM-PAC PT "6 Clicks" Mobility   Outcome Measure  Help needed turning from your back to your side while in a flat bed without using bedrails?: A Little Help needed moving from lying on your back to sitting on the side of a flat bed without using bedrails?: A Little Help needed moving to and from a bed to a chair (including a wheelchair)?: A Little Help needed standing up from a chair using your arms (e.g., wheelchair or bedside chair)?: A Little Help needed to walk in hospital room?: A Little Help needed climbing 3-5 steps with a railing? : A Little 6 Click Score: 18    End of Session   Activity Tolerance: Patient tolerated treatment well Patient left: in chair;with call bell/phone within reach;with chair alarm set Nurse Communication: Mobility status PT Visit Diagnosis: Muscle weakness (generalized) (M62.81);Other abnormalities of gait and mobility (R26.89)     Time: 7902-4097 PT Time Calculation (min) (ACUTE ONLY): 25 min  Charges:  $Gait Training: 8-22 mins $Therapeutic Activity: 8-22 mins                     Rabia Argote R. PTA Acute Rehabilitation Services Office: [place] 09/22/2022, 12:52 PM

## 2022-09-22 NOTE — Progress Notes (Signed)
[place] for warfarin Indication: stroke  Allergies  Allergen Reactions   Isosorbide Nitrate Anaphylaxis    Other reaction(s): Cardiovascular Arrest (ALLERGY/intolerance) Can take Sublingual Nitro.    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    Patient Measurements: Height: '6\' 1"'$  (185.4 cm) Weight: 112.4 kg (247 lb 12.8 oz) IBW/kg (Calculated) : 79.9   Vital Signs: Temp: 97.7 F (36.5 C) (10/04 0315) Temp Source: Oral (10/04 0315) BP: 142/56 (10/04 0315) Pulse Rate: 50 (10/04 0315)  Labs: Recent Labs    09/20/22 0248 09/21/22 0500 09/22/22 0125  HGB 10.8* 10.3*  --   HCT 31.0* 30.8*  --   PLT 142* 142*  --   APTT 89*  --   --   LABPROT  --   --  15.3*  INR  --   --  1.2  HEPARINUNFRC 0.23*  --   --   CREATININE  --  1.61* 1.67*     Estimated Creatinine Clearance: 48.7 mL/min (A) (by C-G formula based on SCr of 1.67 mg/dL (H)).   Medical History: Past Medical History:  Diagnosis Date   A-fib [facility])    Anxiety    Benign prostate hyperplasia    Cancer ([place])    Kidney   Chronic kidney disease    Congestive heart failure (CHF) (HCC)    CVA (cerebral infarction)    Depression    Dyspnea    GERD (gastroesophageal reflux disease)    Gout    Hematospermia 01/13/2012   History of myocardial infarction 1993   Hyperlipidemia    Hypertension    Sleep apnea    Stroke ([place])    Vitamin B 12 deficiency    Weakness of left leg 03/28/2020    Medications:  Medications Prior to Admission  Medication Sig Dispense Refill Last Dose   albuterol (VENTOLIN HFA) 108 (90 Base) MCG/ACT inhaler Inhale 2 puffs into the lungs daily as needed for wheezing or shortness of breath.   unknown   amLODipine (NORVASC) 10 MG tablet Take 10 mg by mouth daily.   09/13/2022   apixaban (ELIQUIS) 5 MG TABS tablet Take 5 mg by mouth 2 (two) times daily.    09/13/2022 at 1600   atorvastatin (LIPITOR)  80 MG tablet Take 80 mg by mouth daily.    09/13/2022   calcitRIOL (ROCALTROL) 0.25 MCG capsule Take 0.25 mcg by mouth daily.    09/13/2022   carbamide peroxide (DEBROX) 6.5 % OTIC solution Place 5-10 drops into both ears 2 (two) times daily.   unknown   carvedilol (COREG) 25 MG tablet Take 25 mg by mouth in the morning and at bedtime.    09/13/2022 at unknown   clopidogrel (PLAVIX) 75 MG tablet Take 75 mg by mouth daily.   09/13/2022 at unknown   colchicine 0.6 MG tablet Take 0.5 tablets (0.3 mg total) by mouth 2 (two) times daily. 30 tablet 3 09/13/2022   Docusate Sodium (DSS) 100 MG CAPS Take 100 mg by mouth daily.   09/13/2022   escitalopram (LEXAPRO) 20 MG tablet Take 20 mg by mouth at bedtime.   09/12/2022   Febuxostat 80 MG TABS Take 80 mg by mouth daily.   09/13/2022   furosemide (LASIX) 40 MG tablet Take 1 tablet (40 mg total) by mouth daily. (Patient taking differently: Take 40 mg by mouth 2 (two) times daily.) 30 tablet 0 09/13/2022   hydrALAZINE (APRESOLINE) 50 MG tablet Take  1 tablet (50 mg total) by mouth 3 (three) times daily. 90 tablet 0 09/13/2022   pantoprazole (PROTONIX) 40 MG tablet Take 40 mg by mouth daily.    09/13/2022   potassium chloride (KLOR-CON M) 10 MEQ tablet Take 10 mEq by mouth daily.   09/13/2022   primidone (MYSOLINE) 50 MG tablet Take 100 mg by mouth in the morning and at bedtime.    09/13/2022   traZODone (DESYREL) 50 MG tablet Take 25 mg by mouth at bedtime as needed for sleep.   unknown   Vitamin D, Ergocalciferol, (DRISDOL) 1.25 MG (50000 UNIT) CAPS capsule Take 50,000 Units by mouth every 7 (seven) days. Sunday   Past Week   Scheduled:   sodium chloride   Intravenous Once   amLODipine  10 mg Oral Daily   atorvastatin  80 mg Oral Daily   calcitRIOL  0.25 mcg Oral Daily   carvedilol  25 mg Oral BID WC   clopidogrel  75 mg Oral Daily   colchicine  0.3 mg Oral BID   docusate sodium  100 mg Oral Daily   escitalopram  20 mg Oral QHS   febuxostat  80 mg Oral Daily    furosemide  40 mg Oral Daily   pantoprazole  40 mg Oral Daily   primidone  100 mg Oral BID   Warfarin - Pharmacist Dosing Inpatient   Does not apply q1600    Assessment: 76 yo male with CVA and was on apixaban PTA for afib. Plans are to change anticoagulation due to apixaban failure. He is noted on primidone which can decrease apixaban levels. The same effect is expected for rivaroxaban. Recommendations on use of dabigatran and edoxaban with primidone vary across sources (caution or avoidance).  Best option to avoid recurrent CVA is likely warfarin.  I discussed options with patient and warfarin started on 10/3.  -INR= 1.2  Goal of Therapy:  INR 2-3 Monitor platelets by anticoagulation protocol: Yes   Plan:  -Warfarin '5mg'$  po today -Daily INR  Hildred Laser, PharmD Clinical Pharmacist **Pharmacist phone directory can now be found on [email or web address] ([email or web address]).  Listed under [place].

## 2022-09-23 ENCOUNTER — Other Ambulatory Visit (HOSPITAL_COMMUNITY): Payer: Self-pay

## 2022-09-23 DIAGNOSIS — I63032 Cerebral infarction due to thrombosis of left carotid artery: Secondary | ICD-10-CM | POA: Diagnosis not present

## 2022-09-23 LAB — BASIC METABOLIC PANEL
Anion gap: 7 (ref 5–15)
BUN: 33 mg/dL — ABNORMAL HIGH (ref 8–23)
CO2: 24 mmol/L (ref 22–32)
Calcium: 8.6 mg/dL — ABNORMAL LOW (ref 8.9–10.3)
Chloride: 109 mmol/L (ref 98–111)
Creatinine, Ser: 1.54 mg/dL — ABNORMAL HIGH (ref 0.61–1.24)
GFR, Estimated: 46 mL/min — ABNORMAL LOW (ref >60)
Glucose, Bld: 90 mg/dL (ref 70–99)
Potassium: 3.8 mmol/L (ref 3.5–5.1)
Sodium: 140 mmol/L (ref 135–145)

## 2022-09-23 LAB — PROTIME-INR
INR: 1.7 — ABNORMAL HIGH (ref 0.8–1.2)
Prothrombin Time: 20 seconds — ABNORMAL HIGH (ref 11.4–15.2)

## 2022-09-23 MED ORDER — WARFARIN SODIUM 2.5 MG PO TABS
2.5000 mg | ORAL_TABLET | Freq: Once | ORAL | Status: AC
  Administered 2022-09-23: 2.5 mg via ORAL
  Filled 2022-09-23: qty 1

## 2022-09-23 MED ORDER — WARFARIN SODIUM 2.5 MG PO TABS
2.5000 mg | ORAL_TABLET | Freq: Every day | ORAL | 1 refills | Status: DC
  Filled 2022-09-23: qty 30, 30d supply, fill #0

## 2022-09-23 MED ORDER — CARVEDILOL 12.5 MG PO TABS
12.5000 mg | ORAL_TABLET | Freq: Two times a day (BID) | ORAL | 1 refills | Status: DC
  Filled 2022-09-23: qty 60, 30d supply, fill #0

## 2022-09-23 NOTE — Progress Notes (Signed)
Mobility Specialist - Progress Note   09/23/22 1118  Mobility  Activity Ambulated with assistance in hallway  Activity Response Tolerated well  Distance Ambulated (ft) 100 ft  $Mobility charge 1 Mobility  Level of Assistance Minimal assist, patient does 75% or more  Assistive Device Front wheel walker  Mobility Referral Yes   Pt received in bed and agreeable to mobility. No complaints throughout. Pt was left in BR and instructed to press call bell when finished. RN notified.   Larey Seat

## 2022-09-23 NOTE — Care Management Important Message (Signed)
Important Message  Patient Details  Name: Alexander Austin MRN: [id number] Date of Birth: 1946/04/28   Medicare Important Message Given:  Yes     Shelda Altes 09/23/2022, 10:56 AM

## 2022-09-23 NOTE — Progress Notes (Signed)
  PROGRESS NOTE  Patient was supposed to discharge home today.  However after ambulation, patient felt short of breath, requiring oxygen, did not feel ready to be discharged home.  Case manager working on getting DME O2 from the [place] today.  Patient lives at home alone, has a landlord who lives upstairs to help but no other family to help.  Cancel discharge today and hopeful discharge in the morning.   Dessa Phi, DO [facility] 09/23/2022, 12:12 PM  Available via Epic secure chat 7am-7pm After these hours, please refer to coverage provider listed on [email or web address]

## 2022-09-23 NOTE — Progress Notes (Signed)
   VASCULAR SURGERY ASSESSMENT & PLAN:   POD 3 LEFT CAROTID ENDARTERECTOMY: Patient is doing well status post left carotid endarterectomy.   VASCULAR QUALITY INITIATIVE: He is on a statin and Plavix.    ANTICOAGULATION: The patient is on Coumadin since Edoxaban will require preauthorization from [place].  We do not have the patient on aspirin given the high risk for bleeding on triple therapy.  Vascular surgery has arranged follow-up and will be available as needed.  SUBJECTIVE:   No complaints.  PHYSICAL EXAM:   Vitals:   09/23/22 0449 09/23/22 0451 09/23/22 0454 09/23/22 0457  BP:      Pulse:   60 (!) 51  Resp:   16 20  Temp:      TempSrc:      SpO2: (!) 84% (!) 83% (S) (!) 79% 96%  Weight:      Height:       No focal weakness or paresthesias.  LABS:   Lab Results  Component Value Date   INR 1.7 (H) 09/23/2022   PROBLEM LIST:    Principal Problem:   CVA (cerebral vascular accident) ([place]) Active Problems:   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 (HCC)   PAF (paroxysmal atrial fibrillation) ([place])   Essential hypertension, benign   Chronic kidney disease, stage 3a ([place])   Obesity (BMI 30.0-34.9)   Internal carotid artery stenosis, left   Skin wound from surgical incision   AICD (automatic cardioverter/defibrillator) present   Preoperative cardiovascular examination   CURRENT MEDS:    amLODipine  10 mg Oral Daily   atorvastatin  80 mg Oral Daily   calcitRIOL  0.25 mcg Oral Daily   carvedilol  12.5 mg Oral BID WC   clopidogrel  75 mg Oral Daily   colchicine  0.3 mg Oral BID   docusate sodium  100 mg Oral Daily   escitalopram  20 mg Oral QHS   febuxostat  80 mg Oral Daily   furosemide  40 mg Oral BID   pantoprazole  40 mg Oral Daily   primidone  100 mg Oral BID   warfarin  2.5 mg Oral ONCE-1600   Warfarin - Pharmacist Dosing Inpatient   Does not apply [place] Office: [phone] 09/23/2022

## 2022-09-23 NOTE — Progress Notes (Signed)
SATURATION QUALIFICATIONS: (This note is used to comply with regulatory documentation for home oxygen)  Patient Saturations on Room Air at Rest = 91%  Patient Saturations on Room Air while Ambulating = 77%  Patient Saturations on 4 Liters of oxygen while Ambulating = 97%

## 2022-09-23 NOTE — Progress Notes (Signed)
SPO2 77-85% on room air at night with open mouth breathing. 3LPM of O2 NCL given to maintain SPO2 above 92-98%. He is hemodynamically stable. V-paced on the monitor. BP stable, neurological intact. Pain is well tolerated. Left nec incision is dry and clean. No bleeding or hematoma. We will monitor.  Kennyth Lose, RN

## 2022-09-23 NOTE — Discharge Summary (Signed)
Physician Discharge Summary  Alexander Austin:[id number] DOB: 1946/07/16 DOA: 09/13/2022  PCP: Windy Fast, MD  Admit date: 09/13/2022 Discharge date: 09/23/2022  Admitted From: Home Disposition:  Home health   Recommendations for Outpatient Follow-up:  Follow up with PCP in 1 week Follow up with vascular surgery as scheduled in 3 weeks Need INR checked to maintain INR 2-3 on Coumadin.  Stop Eliquis.   Discharge Condition: Stable, improved CODE STATUS: DO NOT INTUBATE Diet recommendation: Heart healthy diet  Brief/Interim Summary: Alexander Austin is a 76 year old male with medical history of chronic systolic heart failure, CAD s/p CABG x3 with ICD and pacemaker, paroxysmal atrial fibrillation on Eliquis, CVA, pulmonary hypertension, CKD stage IV with history of renal cell cancer s/p nephrectomy presented with right-sided weakness.  Patient takes Plavix and Eliquis at home. In the ED he was hypertensive blood pressure 160/64. Code stroke was called, CT head showed small cortical/subcortical infarct in the left frontal lobe that was new from 2015.  CTA head and neck showed no large vessel occlusion but had severe stenosis of the origin of left ICA greater than 80%. Patient was not a candidate for TNKase, since he is on Eliquis.  Neurology was consulted. vascular surgery consulted, patient underwent left carotid endarterectomy.  Patient worked with physical therapy who recommended home health.  He had episodes of nocturnal desaturations, his home Lasix was resumed.  He stated that he had had a sleep study in the past, has a CPAP machine at home that he does not use.  He was discharged on Coumadin.  Discharge Diagnoses:   Principal Problem:   CVA (cerebral vascular accident) [facility]) Active Problems:   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 (HCC)   PAF (paroxysmal atrial fibrillation) ([place])   Essential hypertension, benign    Chronic kidney disease, stage 3a ([place])   Obesity (BMI 30.0-34.9)   Internal carotid artery stenosis, left   Skin wound from surgical incision   AICD (automatic cardioverter/defibrillator) present   Preoperative cardiovascular examination   CVA -CT head showed small cortical/subcortical infarct within the mid to posterior left frontal lobe, left MCA/ACA watershed territory, new from prior head CT of 09/07/2014 -CTA head and neck showed severe stenosis of the origin of left ICA greater than 80%; carotid duplex obtained which shows 60 to 79% left ICA stenosis.   -MRI brain showed small acute to early subacute left frontoparietal infarct, chronic ischemia with multiple chronic infarcts -Vascular surgery consulted, patient underwent left carotid endarterectomy 10/2 -Neuro surgery signed off -Eliquis had interaction with primidone so Eliquis was not restarted. Edoxaban will require preauthorization from [place]. Thus started on coumadin  -Lipitor    OSA -Patient desaturates to 70 to 80% overnight.  Patient states that he has had a sleep study and has a CPAP at home, which he does not wear -Obtain oxygen desaturation screening prior to discharge to see if he qualifies for daytime oxygen    Acute on chronic diastolic CHF -Echocardiogram from 09/14/2022 showed EF of 50 to 60%, grade 2 diastolic dysfunction -Continue Lasix   Chronic kidney disease stage IIIa -Stable   Hypertension -Continue amlodipine, Coreg    Paroxysmal atrial fibrillation -Found out that patient was on Eliquis and primidone and primidone interacts with Eliquis and makes it less effective; which may explain that patient had stroke while being on Eliquis. Eliquis has been discontinued -Warfarin and Plavix have been restarted   CAD s/p CABG -  Continue Plavix    Discharge Instructions  Discharge Instructions     Call MD for:  difficulty breathing, headache or visual disturbances   Complete by: As directed    Call  MD for:  extreme fatigue   Complete by: As directed    Call MD for:  persistant dizziness or light-headedness   Complete by: As directed    Call MD for:  persistant nausea and vomiting   Complete by: As directed    Call MD for:  redness, tenderness, or signs of infection (pain, swelling, redness, odor or green/yellow discharge around incision site)   Complete by: As directed    Call MD for:  severe uncontrolled pain   Complete by: As directed    Call MD for:  temperature >100.4   Complete by: As directed    Diet - low sodium heart healthy   Complete by: As directed    Discharge instructions   Complete by: As directed    You were cared for by a hospitalist during your hospital stay. If you have any questions about your discharge medications or the care you received while you were in the hospital after you are discharged, you can call the unit and ask to speak with the hospitalist on call if the hospitalist that took care of you is not available. Once you are discharged, your primary care physician will handle any further medical issues. Please note that NO REFILLS for any discharge medications will be authorized once you are discharged, as it is imperative that you return to your primary care physician (or establish a relationship with a primary care physician if you do not have one) for your aftercare needs so that they can reassess your need for medications and monitor your lab values.   Increase activity slowly   Complete by: As directed    No wound care   Complete by: As directed       Allergies as of 09/23/2022       Reactions   Isosorbide Nitrate Anaphylaxis   Other reaction(s): Cardiovascular Arrest (ALLERGY/intolerance) Can take Sublingual Nitro.   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   amLODipine 10 MG tablet Commonly known as: NORVASC Take 10 mg by mouth daily.   atorvastatin 80 MG tablet Commonly known as: LIPITOR Take 80 mg by mouth daily.   calcitRIOL 0.25 MCG capsule Commonly known as: ROCALTROL Take 0.25 mcg by mouth daily.   carbamide peroxide 6.5 % OTIC solution Commonly known as: DEBROX Place 5-10 drops into both ears 2 (two) times daily.   carvedilol 12.5 MG tablet Commonly known as: COREG Take 1 tablet (12.5 mg total) by mouth 2 (two) times daily with a meal. What changed:  medication strength how much to take when to take this   clopidogrel 75 MG tablet Commonly known as: PLAVIX Take 75 mg by mouth daily.   colchicine 0.6 MG tablet Take 0.5 tablets (0.3 mg total) by mouth 2 (two) times daily.   DSS 100 MG  Caps Take 100 mg by mouth daily.   escitalopram 20 MG tablet Commonly known as: LEXAPRO Take 20 mg by mouth at bedtime.   Febuxostat 80 MG Tabs Take 80 mg by mouth daily.   furosemide 40 MG tablet Commonly known as: LASIX Take 1 tablet (40 mg total) by mouth daily. What changed: when to take this   pantoprazole 40 MG tablet Commonly known as: PROTONIX Take 40 mg by mouth daily.   potassium chloride 10 MEQ tablet Commonly known as: KLOR-CON M Take 10 mEq by mouth daily.   primidone 50 MG tablet Commonly known as: MYSOLINE Take 100 mg by mouth in the morning and at bedtime.   traZODone 50 MG tablet Commonly known as: DESYREL Take 25 mg by mouth at bedtime as needed for sleep.   Vitamin D (Ergocalciferol) 1.25 MG (50000 UNIT) Caps capsule Commonly known as: DRISDOL Take 50,000 Units by mouth every 7 (seven) days. Sunday   warfarin 2.5 MG tablet Commonly known as: COUMADIN Take 1  tablet (2.5 mg total) by mouth daily at 4 PM.        Follow-up Information     [facility] Follow up in 3 week(s).   Specialty: Vascular Surgery Why: Office will call you to arrange your appt (sent). Contact information: [address] [place] [phone]        Windy Fast, MD. Schedule an appointment as soon as possible for a visit in 1 week(s).   Specialty: Internal Medicine Contact information: [address] [place] [address] [phone]                Allergies  Allergen Reactions   Isosorbide Nitrate Anaphylaxis    Other reaction(s): Cardiovascular Arrest (ALLERGY/intolerance) Can take Sublingual Nitro.    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 EXAM: PORTABLE CHEST 1 VIEW COMPARISON:  July 03, 2020 FINDINGS: Multi lead pacer defibrillator remains in place. Power pack over LEFT chest. EKG leads project over the chest. Image rotated slightly to the RIGHT. Cardiomediastinal contours and hilar structures are stable with signs of cardiac enlargement following median sternotomy for CABG. No lobar consolidation. No visible pneumothorax. Lung volumes are low normal. On limited assessment no acute skeletal findings. IMPRESSION: 1. Low lung volumes without acute cardiopulmonary disease. 2. Stable cardiac enlargement following median sternotomy for CABG with multi lead pacer defibrillator in place. Electronically Signed   By: Zetta Bills M.D.   On: 09/21/2022 14:32   VAS US CAROTID  Result Date: 09/15/2022 Carotid Arterial Duplex Study Patient Name:  SUZANNE GARBERS  Date of Exam:   09/15/2022 Medical Rec #: [id number]     Accession #:    [id number] Date of Birth: Jun 14, 1946      Patient Gender: M Patient Age:   23 years Exam Location:  [facility] Procedure:      VAS US CAROTID Referring Phys: Frederich Chick LAMA --------------------------------------------------------------------------------  Indications:       CVA and Sudden onset right sided weakness and numbness with                    difficulty ambulating. CTA neck showed severe stenosis at the  origin of the left ICA (likely greater than 80%). Risk Factors:      Hypertension, hyperlipidemia, past history of smoking. Other Factors:     Atrial fibrillation. Comparison Study:  09/13/2022- CTA neck Performing Technologist: Maudry Mayhew MHA, RDMS, RVT, RDCS  Examination Guidelines: A complete evaluation includes B-mode imaging, spectral Doppler, color Doppler, and power Doppler as needed of all accessible portions of each vessel. Bilateral testing is considered an integral part of a complete examination. Limited examinations for reoccurring indications may be performed as noted.  Right Carotid Findings: +----------+--------+-------+--------+----------------------+------------------+           PSV cm/sEDV    StenosisPlaque Description    Comments                             cm/s                                                    +----------+--------+-------+--------+----------------------+------------------+ CCA Prox  109     10                                                      +----------+--------+-------+--------+----------------------+------------------+ CCA Distal93      9                                    intimal thickening +----------+--------+-------+--------+----------------------+------------------+ ICA Prox  131     24             smooth and                                                                heterogenous                             +----------+--------+-------+--------+----------------------+------------------+ ICA Distal158     29                                                       +----------+--------+-------+--------+----------------------+------------------+ ECA       89                                           intimal thickening +----------+--------+-------+--------+----------------------+------------------+ +----------+--------+-------+----------------+-------------------+           PSV cm/sEDV cmsDescribe        Arm Pressure (mmHG) +----------+--------+-------+----------------+-------------------+ [id number]             Multiphasic, WNL                    +----------+--------+-------+----------------+-------------------+ +---------+--------+--+--------+--+---------+ VertebralPSV cm/s31EDV  cm/s10Antegrade +---------+--------+--+--------+--+---------+  Left Carotid Findings: +----------+--------+--------+--------+------------------------------+---------+           PSV cm/sEDV cm/sStenosisPlaque Description            Comments  +----------+--------+--------+--------+------------------------------+---------+ CCA Prox  100     9                                                       +----------+--------+--------+--------+------------------------------+---------+ CCA Distal71      9               focal and calcific                      +----------+--------+--------+--------+------------------------------+---------+ ICA Prox  232     35              heterogenous, irregular and   Shadowing                                   calcific                                +----------+--------+--------+--------+------------------------------+---------+ ICA Mid   170     35                                                      +----------+--------+--------+--------+------------------------------+---------+ ICA Distal116     20                                                      +----------+--------+--------+--------+------------------------------+---------+ ECA       246                      heterogenous, irregular and   shadowing                                   calcific                                +----------+--------+--------+--------+------------------------------+---------+ +----------+--------+--------+----------------+-------------------+           PSV cm/sEDV cm/sDescribe        Arm Pressure (mmHG) +----------+--------+--------+----------------+-------------------+ Subclavian117             Multiphasic, WNL                    +----------+--------+--------+----------------+-------------------+ +---------+--------+--+--------+--+---------+ VertebralPSV cm/s64EDV cm/s12Antegrade +---------+--------+--+--------+--+---------+   Summary: Right Carotid: Velocities in the right ICA are consistent with a 1-39% stenosis. Left Carotid: Velocities in the left ICA are consistent with an upper range               1-39% stenosis by end diastolic velocity, and 03-00% stenosis by  peak systolic velocity. Vertebrals:  Bilateral vertebral arteries demonstrate antegrade flow. Subclavians: Normal flow hemodynamics were seen in bilateral subclavian              arteries. *See table(s) above for measurements and observations.  Electronically signed by Antony Contras MD on 09/15/2022 at 2:33:21 PM.    Final    MR BRAIN WO CONTRAST  Result Date: 09/15/2022 CLINICAL DATA:  Stroke, follow-up.  Right-sided weakness. EXAM: MRI HEAD WITHOUT CONTRAST TECHNIQUE: Multiplanar, multiecho pulse sequences of the brain and surrounding structures were obtained without intravenous contrast. COMPARISON:  Head CT and CTA 09/13/2022 FINDINGS: Brain: There is a small acute to early subacute cortical and subcortical infarct in the left frontoparietal region. Multiple chronic cortical and subcortical infarcts are noted in the left greater than right frontal and parietal lobes and in the left occipital lobe. There are also small chronic bilateral cerebellar infarcts. There are a few scattered  chronic cerebral and cerebellar microhemorrhages. Mild-to-moderate T2 hyperintensities elsewhere in the cerebral white matter bilaterally are nonspecific but compatible with chronic small vessel ischemic disease. T2 hyperintensities in the basal ganglia bilaterally reflect a combination of dilated perivascular spaces and chronic lacunar infarcts. Mild chronic small vessel changes are also present in the thalami and pons. No mass, midline shift, or extra-axial fluid collection is identified. There is moderate cerebral atrophy. Vascular: Major intracranial vascular flow voids are preserved. Skull and upper cervical spine: Unremarkable bone marrow signal. Sinuses/Orbits: Unremarkable orbits. Paranasal sinuses and mastoid air cells are clear. Other: None. IMPRESSION: 1. Small acute to early subacute left frontoparietal infarct. 2. Chronic ischemia with multiple chronic infarcts as above. Electronically Signed   By: Logan Bores M.D.   On: 09/15/2022 11:47   ECHOCARDIOGRAM COMPLETE  Result Date: 09/14/2022    ECHOCARDIOGRAM REPORT   Patient Name:   KAMEREN PARGAS Date of Exam: 09/14/2022 Medical Rec #:  [id number]    Height:       73.0 in Accession #:    [id number]   Weight:       247.8 lb Date of Birth:  [birth year]    BSA:          2.357 m Patient Age:    40 years     BP:           155/72 mmHg Patient Gender: M            HR:           63 bpm. Exam Location:  Inpatient Procedure: 2D Echo, Cardiac Doppler, Color Doppler and Intracardiac            Opacification Agent Indications:    Stroke  History:        Patient has no prior history of Echocardiogram examinations. CAD                 and NSTEMI, CVA, Pulmonary HTN and Carotid Disease,                 Arrythmias:Atrial Fibrillation, Signs/Symptoms:Edema; Risk                 Factors:Hypertension and Current Smoker.  Sonographer:    Greer Pickerel Sonographer#2:  Raquel Sarna Senior Referring Phys: [id number] [place] T TU  Sonographer Comments: Technically difficult study due to poor  echo windows, suboptimal parasternal window, suboptimal apical window, no subcostal window and patient is obese. Image acquisition challenging due to respiratory motion. IMPRESSIONS  1. Left ventricular ejection fraction, by estimation, is 50 to  55%. The left ventricle has low normal function. The left ventricle has no regional wall motion abnormalities. Left ventricular diastolic parameters are consistent with Grade II diastolic dysfunction (pseudonormalization).  2. Right ventricular systolic function is normal. The right ventricular size is normal. There is normal pulmonary artery systolic pressure.  3. Left atrial size was severely dilated.  4. The mitral valve is grossly normal. No evidence of mitral valve regurgitation. No evidence of mitral stenosis.  5. The aortic valve was not well visualized. Aortic valve regurgitation is trivial. Comparison(s): No significant change from prior study. Prior study not done with contrast. FINDINGS  Left Ventricle: Left ventricular ejection fraction, by estimation, is 50 to 55%. The left ventricle has low normal function. The left ventricle has no regional wall motion abnormalities. Definity contrast agent was given IV to delineate the left ventricular endocardial borders. The left ventricular internal cavity size was normal in size. Suboptimal image quality limits for assessment of left ventricular hypertrophy. Left ventricular diastolic parameters are consistent with Grade II diastolic dysfunction (pseudonormalization). Right Ventricle: The right ventricular size is normal. No increase in right ventricular wall thickness. Right ventricular systolic function is normal. There is normal pulmonary artery systolic pressure. The tricuspid regurgitant velocity is 2.24 m/s, and  with an assumed right atrial pressure of 8 mmHg, the estimated right ventricular systolic pressure is 36.1 mmHg. Left Atrium: Left atrial size was severely dilated. Right Atrium: Right atrial size was  normal in size. Pericardium: There is no evidence of pericardial effusion. Mitral Valve: The mitral valve is grossly normal. No evidence of mitral valve regurgitation. No evidence of mitral valve stenosis. Tricuspid Valve: The tricuspid valve is grossly normal. Tricuspid valve regurgitation is not demonstrated. No evidence of tricuspid stenosis. Aortic Valve: The aortic valve was not well visualized. Aortic valve regurgitation is trivial. Pulmonic Valve: The pulmonic valve was normal in structure. Pulmonic valve regurgitation is mild. No evidence of pulmonic stenosis. Aorta: The aortic root is normal in size and structure. IAS/Shunts: No atrial level shunt detected by color flow Doppler.  LEFT VENTRICLE PLAX 2D LVOT diam:     2.10 cm      Diastology LV SV:         73           LV e' medial:    5.93 cm/s LV SV Index:   31           LV E/e' medial:  15.6 LVOT Area:     3.46 cm     LV e' lateral:   6.15 cm/s                             LV E/e' lateral: 15.0  LV Volumes (MOD) LV vol d, MOD A4C: 198.0 ml LV vol s, MOD A4C: 96.2 ml LV SV MOD A4C:     198.0 ml RIGHT VENTRICLE RV S prime:     5.40 cm/s TAPSE (M-mode): 2.0 cm LEFT ATRIUM              Index        RIGHT ATRIUM           Index LA Vol (A2C):   113.0 ml 47.95 ml/m  RA Area:     22.90 cm LA Vol (A4C):   150.0 ml 63.65 ml/m  RA Volume:   64.00 ml  27.16 ml/m LA Biplane Vol: 145.0 ml 61.52 ml/m  AORTIC VALVE  PULMONIC VALVE LVOT Vmax:   91.10 cm/s  PR End Diast Vel: 2.84 msec LVOT Vmean:  68.100 cm/s LVOT VTI:    0.212 m  AORTA Ao Root diam: 4.10 cm Ao Asc diam:  3.50 cm MITRAL VALVE               TRICUSPID VALVE MV Area (PHT): 3.66 cm    TR Peak grad:   20.1 mmHg MV Decel Time: 207 msec    TR Vmax:        224.00 cm/s MV E velocity: 92.30 cm/s MV A velocity: 72.40 cm/s  SHUNTS MV E/A ratio:  1.27        Systemic VTI:  0.21 m                            Systemic Diam: 2.10 cm Rudean Haskell MD Electronically signed by Rudean Haskell MD  Signature Date/Time: 09/14/2022/2:10:45 PM    Final    CT ANGIO HEAD NECK W WO CM (CODE STROKE)  Result Date: 09/13/2022 CLINICAL DATA:  By history: Neuro deficit, acute, stroke suspected. Right-sided weakness. EXAM: CT ANGIOGRAPHY HEAD AND NECK TECHNIQUE: Multidetector CT imaging of the head and neck was performed using the standard protocol during bolus administration of intravenous contrast. Multiplanar CT image reconstructions and MIPs were obtained to evaluate the vascular anatomy. Carotid stenosis measurements (when applicable) are obtained utilizing NASCET criteria, using the distal internal carotid diameter as the denominator. RADIATION DOSE REDUCTION: This exam was performed according to the departmental dose-optimization program which includes automated exposure control, adjustment of the mA and/or kV according to patient size and/or use of iterative reconstruction technique. CONTRAST:  66m OMNIPAQUE IOHEXOL 350 MG/ML SOLN COMPARISON:  Noncontrast head CT performed earlier today 09/13/2022. FINDINGS: CTA NECK FINDINGS Aortic arch: Standard aortic branching. Atherosclerotic plaque within the visualized aortic arch and proximal major branch vessels of the neck. Streak and beam hardening artifact arising from a dense right-sided contrast bolus partially obscures the right subclavian artery. However, there is an apparent severe stenosis within the mid right subclavian artery (series 8, image 205). Right carotid system: CCA and ICA patent within the neck. Atherosclerotic plaque within the distal CCA, about the carotid bifurcation and within the proximal ICA. Atherosclerotic narrowing of the proximal ICA of up to 60%. Foci ulcerated plaque within the carotid bulb. Left carotid system: CCA and ICA patent within the neck. Atherosclerotic plaque at the CCA origin, within the distal CCA, about the carotid bifurcation and within the proximal to mid ICA. Stenosis is greatest at the origin of the left ICA (severe  and likely greater than 80% at this site) (series 7, image 236). Vertebral arteries: Vertebral arteries patent within the neck. Atherosclerotic disease within these vessels. Most notably, there is multifocal moderate severe stenoses within the at the right vertebral artery origin and within the right V1 segment. Additionally, there is a moderate/severe stenosis at the origin of the left vertebral artery. Skeleton: Multilevel vertebral ankylosis within the cervical and upper thoracic spine. Cervical spondylosis. At C3-C4, a posterior disc osteophyte complex and ossification of the posterior longitudinal ligament contribute to apparent severe spinal canal stenosis. No acute fracture or aggressive osseous lesion. Other neck: Subcentimeter thyroid nodules, not meeting consensus criteria for ultrasound follow-up based on size. No follow-up imaging is recommended. Reference: J Am Coll Radiol. 2015 Feb;12(2): 143-50. Upper chest: No consolidation within the imaged lung apices. Review of the MIP images confirms the above findings  CTA HEAD FINDINGS Anterior circulation: The intracranial internal carotid arteries are patent. Atherosclerotic plaque within both vessels. No more than mild stenosis of the intracranial right ICA. Up to moderate stenosis of the paraclinoid left ICA. The M1 middle cerebral arteries are patent. Atherosclerotic irregularity of the M2 and more distal MCA vessels, bilaterally. Most notably, there is a severe stenosis within a mid M1 right MCA vessel (series 12, image 40). No M2 proximal branch occlusion is identified. The anterior cerebral arteries are patent. No intracranial aneurysm is identified. Posterior circulation: The intracranial vertebral arteries are patent. Sites of severe stenosis within the proximal V4 segments, bilaterally. The basilar artery is patent. Fenestration within the proximal basilar artery. The posterior cerebral arteries are patent. Posterior communicating arteries are  hypoplastic or absent, bilaterally. Venous sinuses: Within the limitations of contrast timing, no convincing thrombus. Anatomic variants: As described. Review of the MIP images confirms the above findings No emergent large vessel occlusion identified. These results were communicated to Dr. Lorrin Goodell At 6:28 pmon 9/25/2023by text page via the [facility] messaging system. IMPRESSION: CTA neck: 1. The common carotid and internal carotid arteries are patent within the neck. Atherosclerotic plaque bilaterally. Most notably, there is a severe stenosis at the origin of the left ICA (likely greater than 80%). 2. Vertebral arteries patent within the neck. Sites of moderate/severe stenosis at the right vertebral artery origin, within the right V1 segment and at the origin of the left vertebral artery. 3. 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 4. Aortic Atherosclerosis (ICD10-I70.0). 5. 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 Consider a dedicated cervical spine MRI for further evaluation. CTA head: 1. No intracranial large vessel occlusion is identified. 2. Intracranial atherosclerotic disease with multifocal stenoses, most notably as follows. 3. Up to moderate stenosis within the paraclinoid left ICA. 4. Severe focal stenosis within a mid M2 right MCA vessel. 5. Sites of severe stenosis within the proximal V4 vertebral arteries, bilaterally. Electronically Signed   By: Kellie Simmering D.O.   On: 09/13/2022 18:31   CT HEAD CODE STROKE WO CONTRAST  Result Date: 09/13/2022 CLINICAL DATA:  Code stroke. Neuro deficit, acute, stroke suspected. Right-sided weakness. EXAM: CT HEAD WITHOUT CONTRAST TECHNIQUE: Contiguous axial images were obtained from the base of the skull through the vertex without intravenous contrast. RADIATION DOSE REDUCTION: This exam was performed according to the departmental dose-optimization program which includes automated exposure control, adjustment of the mA and/or kV according  to patient size and/or use of iterative reconstruction technique. COMPARISON:  Head CT 09/07/2014 (images available, report unavailable). FINDINGS: Brain: Moderate cerebral atrophy. Small cortical/subcortical infarct within the mid to posterior left frontal lobe (within the left MCA/ACA watershed territory), new from the prior head CT of 09/07/2014, but otherwise age-indeterminate. Small chronic cortical/subcortical infarcts scattered elsewhere within the bilateral cerebral hemispheres. Background mild patchy and ill-defined hypoattenuation within the cerebral white matter, nonspecific but compatible with chronic small vessel disease. Chronic lacunar infarcts within bilateral basal ganglia. Small chronic infarcts within the bilateral cerebellar hemispheres. Vascular: No hyperdense vessel.  Atherosclerotic calcifications. Skull: No fracture or aggressive osseous lesion. Sinuses/Orbits: No mass or acute finding within the imaged orbits. Small mucous retention cysts within the right maxillary sinus. ASPECTS [facility] Stroke Program Early CT Score) - Ganglionic level infarction (caudate, lentiform nuclei, internal capsule, insula, M1-M3 cortex): 7 - Supraganglionic infarction (M4-M6 cortex): 2 Total score (0-10 with 10 being normal): 9 These results were communicated to Dr. Lorrin Goodell At 6:10 pmon 9/25/2023by text page via the [facility]  messaging system. IMPRESSION: Small cortical/subcortical infarct within the mid-to-posterior left frontal lobe (left MCA/ACA watershed territory), new from the prior head CT of 09/07/2014, but otherwise age-indeterminate. Background parenchymal atrophy, chronic small-vessel ischemic disease and chronic infarcts, as described. Electronically Signed   By: Kellie Simmering D.O.   On: 09/13/2022 18:10       Discharge Exam: Vitals:   09/23/22 0457 09/23/22 0900  BP:  (!) 164/67  Pulse: (!) 51 (!) 54  Resp: 20 16  Temp:  97.9 F (36.6 C)  SpO2: 96% 100%    General: Pt is alert, awake,  not in acute distress, left carotid incision clean and dry and intact  Cardiovascular: Bradycardic, regular rhythm, S1/S2 +, no edema Respiratory: CTA bilaterally, no wheezing, no rhonchi, no respiratory distress, no conversational dyspnea  Abdominal: Soft, NT, ND, bowel sounds + Extremities: no edema, no cyanosis Psych: Normal mood and affect, stable judgement and insight     The results of significant diagnostics from this hospitalization (including imaging, microbiology, ancillary and laboratory) are listed below for reference.     Microbiology: Recent Results (from the past 240 hour(s))  Resp Panel by RT-PCR (Flu A&B, Covid) Anterior Nasal Swab     Status: None   Collection Time: 09/13/22  6:31 PM   Specimen: Anterior Nasal Swab  Result Value Ref Range Status   SARS Coronavirus 2 by RT PCR NEGATIVE NEGATIVE Final    Comment: (NOTE) SARS-CoV-2 target nucleic acids are NOT DETECTED.  The SARS-CoV-2 RNA is generally detectable in upper respiratory specimens during the acute phase of infection. The lowest concentration of SARS-CoV-2 viral copies this assay can detect is 138 copies/mL. A negative result does not preclude SARS-Cov-2 infection and should not be used as the sole basis for treatment or other patient management decisions. A negative result may occur with  improper specimen collection/handling, submission of specimen other than nasopharyngeal swab, presence of viral mutation(s) within the areas targeted by this assay, and inadequate number of viral copies(<138 copies/mL). A negative result must be combined with clinical observations, patient history, and epidemiological information. The expected result is Negative.  Fact Sheet for Patients:  EntrepreneurPulse.com.au  Fact Sheet for Healthcare Providers:  IncredibleEmployment.be  This test is no t yet approved or cleared by the [place] FDA and  has been authorized for  detection and/or diagnosis of SARS-CoV-2 by FDA under an Emergency Use Authorization (EUA). This EUA will remain  in effect (meaning this test can be used) for the duration of the COVID-19 declaration under Section 564(b)(1) of the Act, 21 U.S.C.section 360bbb-3(b)(1), unless the authorization is terminated  or revoked sooner.       Influenza A by PCR NEGATIVE NEGATIVE Final   Influenza B by PCR NEGATIVE NEGATIVE Final    Comment: (NOTE) The Xpert Xpress SARS-CoV-2/FLU/RSV plus assay is intended as an aid in the diagnosis of influenza from Nasopharyngeal swab specimens and should not be used as a sole basis for treatment. Nasal washings and aspirates are unacceptable for Xpert Xpress SARS-CoV-2/FLU/RSV testing.  Fact Sheet for Patients: EntrepreneurPulse.com.au  Fact Sheet for Healthcare Providers: IncredibleEmployment.be  This test is not yet approved or cleared by the [place] FDA and has been authorized for detection and/or diagnosis of SARS-CoV-2 by FDA under an Emergency Use Authorization (EUA). This EUA will remain in effect (meaning this test can be used) for the duration of the COVID-19 declaration under Section 564(b)(1) of the Act, 21 U.S.C. section 360bbb-3(b)(1), unless the authorization is terminated or  revoked.  Performed at [facility], [place] [address] [place], [place] [address]   Surgical pcr screen     Status: None   Collection Time: 09/20/22  4:42 AM   Specimen: Nasal Mucosa; Nasal Swab  Result Value Ref Range Status   MRSA, PCR NEGATIVE NEGATIVE Final   Staphylococcus aureus NEGATIVE NEGATIVE Final    Comment: (NOTE) The Xpert SA Assay (FDA approved for NASAL specimens in patients 67 years of age and older), is one component of a comprehensive surveillance program. It is not intended to diagnose infection nor to guide or monitor treatment. Performed at [facility], [place] [address] [place],  [place] [address]      Labs: BNP (last 3 results) No results for input(s): "BNP" in the last 8760 hours. Basic Metabolic Panel: Recent Labs  Lab 09/19/22 0720 09/21/22 0500 09/22/22 0125 09/23/22 0127  NA 138 139 138 140  K 3.7 4.9 4.0 3.8  CL 109 110 110 109  CO2 23 22 21* 24  GLUCOSE 85 102* 101* 90  BUN 17 27* 29* 33*  CREATININE 1.45* 1.61* 1.67* 1.54*  CALCIUM 8.2* 8.6* 8.4* 8.6*   Liver Function Tests: No results for input(s): "AST", "ALT", "ALKPHOS", "BILITOT", "PROT", "ALBUMIN" in the last 168 hours. No results for input(s): "LIPASE", "AMYLASE" in the last 168 hours. No results for input(s): "AMMONIA" in the last 168 hours. CBC: Recent Labs  Lab 09/17/22 1416 09/18/22 0655 09/19/22 0720 09/20/22 0248 09/21/22 0500  WBC 5.3 5.3 3.9* 3.7* 6.1  HGB 13.3 11.3* 11.0* 10.8* 10.3*  HCT 38.4* 33.3* 31.7* 31.0* 30.8*  MCV 89.1 91.2 89.3 89.9 93.3  PLT 190 152 147* 142* 142*   Cardiac Enzymes: No results for input(s): "CKTOTAL", "CKMB", "CKMBINDEX", "TROPONINI" in the last 168 hours. BNP: Invalid input(s): "POCBNP" CBG: No results for input(s): "GLUCAP" in the last 168 hours. D-Dimer No results for input(s): "DDIMER" in the last 72 hours. Hgb A1c No results for input(s): "HGBA1C" in the last 72 hours. Lipid Profile No results for input(s): "CHOL", "HDL", "LDLCALC", "TRIG", "CHOLHDL", "LDLDIRECT" in the last 72 hours. Thyroid function studies No results for input(s): "TSH", "T4TOTAL", "T3FREE", "THYROIDAB" in the last 72 hours.  Invalid input(s): "FREET3" Anemia work up No results for input(s): "VITAMINB12", "FOLATE", "FERRITIN", "TIBC", "IRON", "RETICCTPCT" in the last 72 hours. Urinalysis    Component Value Date/Time   COLORURINE YELLOW 09/14/2022 0553   APPEARANCEUR CLEAR 09/14/2022 0553   LABSPEC 1.010 09/14/2022 0553   PHURINE 5.5 09/14/2022 0553   GLUCOSEU NEGATIVE 09/14/2022 0553   HGBUR NEGATIVE 09/14/2022 0553   BILIRUBINUR NEGATIVE 09/14/2022 0553    KETONESUR NEGATIVE 09/14/2022 0553   PROTEINUR NEGATIVE 09/14/2022 0553   NITRITE NEGATIVE 09/14/2022 0553   LEUKOCYTESUR NEGATIVE 09/14/2022 0553   Sepsis Labs Recent Labs  Lab 09/18/22 0655 09/19/22 0720 09/20/22 0248 09/21/22 0500  WBC 5.3 3.9* 3.7* 6.1   Microbiology Recent Results (from the past 240 hour(s))  Resp Panel by RT-PCR (Flu A&B, Covid) Anterior Nasal Swab     Status: None   Collection Time: 09/13/22  6:31 PM   Specimen: Anterior Nasal Swab  Result Value Ref Range Status   SARS Coronavirus 2 by RT PCR NEGATIVE NEGATIVE Final    Comment: (NOTE) SARS-CoV-2 target nucleic acids are NOT DETECTED.  The SARS-CoV-2 RNA is generally detectable in upper respiratory specimens during the acute phase of infection. The lowest concentration of SARS-CoV-2 viral copies this assay can detect is 138 copies/mL. A  negative result does not preclude SARS-Cov-2 infection and should not be used as the sole basis for treatment or other patient management decisions. A negative result may occur with  improper specimen collection/handling, submission of specimen other than nasopharyngeal swab, presence of viral mutation(s) within the areas targeted by this assay, and inadequate number of viral copies(<138 copies/mL). A negative result must be combined with clinical observations, patient history, and epidemiological information. The expected result is Negative.  Fact Sheet for Patients:  EntrepreneurPulse.com.au  Fact Sheet for Healthcare Providers:  IncredibleEmployment.be  This test is no t yet approved or cleared by the [place] FDA and  has been authorized for detection and/or diagnosis of SARS-CoV-2 by FDA under an Emergency Use Authorization (EUA). This EUA will remain  in effect (meaning this test can be used) for the duration of the COVID-19 declaration under Section 564(b)(1) of the Act, 21 U.S.C.section 360bbb-3(b)(1), unless the  authorization is terminated  or revoked sooner.       Influenza A by PCR NEGATIVE NEGATIVE Final   Influenza B by PCR NEGATIVE NEGATIVE Final    Comment: (NOTE) The Xpert Xpress SARS-CoV-2/FLU/RSV plus assay is intended as an aid in the diagnosis of influenza from Nasopharyngeal swab specimens and should not be used as a sole basis for treatment. Nasal washings and aspirates are unacceptable for Xpert Xpress SARS-CoV-2/FLU/RSV testing.  Fact Sheet for Patients: EntrepreneurPulse.com.au  Fact Sheet for Healthcare Providers: IncredibleEmployment.be  This test is not yet approved or cleared by the [place] FDA and has been authorized for detection and/or diagnosis of SARS-CoV-2 by FDA under an Emergency Use Authorization (EUA). This EUA will remain in effect (meaning this test can be used) for the duration of the COVID-19 declaration under Section 564(b)(1) of the Act, 21 U.S.C. section 360bbb-3(b)(1), unless the authorization is terminated or revoked.  Performed at [facility], [place] [address], [place], [place] [address]   Surgical pcr screen     Status: None   Collection Time: 09/20/22  4:42 AM   Specimen: Nasal Mucosa; Nasal Swab  Result Value Ref Range Status   MRSA, PCR NEGATIVE NEGATIVE Final   Staphylococcus aureus NEGATIVE NEGATIVE Final    Comment: (NOTE) The Xpert SA Assay (FDA approved for NASAL specimens in patients 61 years of age and older), is one component of a comprehensive surveillance program. It is not intended to diagnose infection nor to guide or monitor treatment. Performed at [facility], [place] [address] [place],  [address]      Patient was seen and examined on the day of discharge and was found to be in stable condition. Time coordinating discharge: 35 minutes including assessment and coordination of care, as well as examination of the patient.   SIGNED:  Dessa Phi, DO [facility] 09/23/2022, 10:42 AM

## 2022-09-23 NOTE — Progress Notes (Signed)
Mobility Specialist Progress Note   09/23/22 1200  Mobility  Activity Ambulated with assistance in room;Ambulated with assistance to bathroom  Activity Response Tolerated fair  Distance Ambulated (ft) 15 ft  Level of Assistance Minimal assist, patient does 75% or more  Assistive Device Front wheel walker  Range of Motion/Exercises Active;All extremities   Patient received in bathroom requesting assistance for pericare. Required minimal HHA to stand from low surface of toilet and max/total to assist for pericare. Ambulated in room to recliner with min A requiring cues to stay proximal to RW for safety. Was slightly SOB with short bout exertion but tolerated without incident. Was left in recliner with all needs met, call bell in reach.   [place] Dionne Knoop, [place], [place]  LEXNT:[phone] Office: [phone]

## 2022-09-23 NOTE — TOC Progression Note (Signed)
Transition of Care (TOC) - Progression Note  Marvetta Gibbons RN, BSN Transitions of Care Unit 4E- RN Case Manager See Treatment Team for direct phone #    Patient Details  Name: LAIN TETTERTON MRN: [id number] Date of Birth: 1946-07-22  Transition of Care [facility]) CM/SW Contact  Dahlia Client, Romeo Rabon, RN Phone Number: 09/23/2022, 3:22 PM  Clinical Narrative:    Pt for d/c home today, noted pt will need INR check tomorrow per MD.  CM called [facility]- spoke with CSW at pt's primary care clinic (Dr. Sherral Hammers)- per CSW [place] pt can come to the [facility] lab tomorrow- and present anytime between 8am-4:30pm- pt does not need an appointment.  CSW contact at [facility]- [phone] ext [phone]  D/C summary faxed to Dr. Sherral Hammers at [facility] primary clinic at [phone]  CM spoke with pt at bedside to let him know about INR check, pt is stating he does not feel he is ready to go home today- does not feel good today, per pt he lives alone but states his landlord checks in on him, he reports that his landlord will take him to his appointments and transport him home. Pt states he has all needed DME at home, but does not wear home 02, MD has ordered an ambulating 02 check to see if pt will need home 02.   1215- notified by MD that pt will need home 02, D/C to be canceled for today. Call made back to [facility] and msg left for CSW to return call again for new d/c needs- home 02  1400- Received return call from Corinne at [facility], discussed need for home 02, Domenick Bookbinder is to send via email the urgent needs request, also discussed change for INR check- per Domenick Bookbinder pt can come on Monday and walk in for lab as well anytime betweek 8am-4:30pm- there is no weekend availability for labs unless pt goes to walk-in clinic that is open on Saturday 7am-1pm.  CM has emailed urgent home DME request along with Home 02 orders and support notes to [place] as per [employer]. Orders are being reviewed by the [place] for  approval and they will contact once approved.  CM spoke with pt at bedside to update on home 02 arrangements, pt's landlord-Joe also at bedside and is aware that someone will need to be at the home for the [place] to deliver the home 02 (hopefully tomorrow)- Joe indicated that someone would be available. Joe also states that he will be the one to transport pt home and discussed with Wille Glaser he will need to bring a portable 02 tank with him when he comes once [place] has delivered to the home.   TOC to continue to follow     Expected Discharge Plan: [place] Barriers to Discharge: Continued Medical Work up  Expected Discharge Plan and Services Expected Discharge Plan: [place]   Discharge Planning Services: CM Consult Post Acute Care Choice: [place] arrangements for the past 2 months: [place] Expected Discharge Date: 09/23/22               DME Arranged: O[place] DME Agency: [place] Date DME Agency Contacted: 09/23/22 Time DME Agency Contacted: 1400 Representative spoke with at DME Agency: [place]: RN, PT, Social Work [employer] Agency: [place] Date [place]: 09/15/22   Representative spoke with at [place]: [place] (SDOH) Interventions  Readmission Risk Interventions    07/04/2020   11:18 AM 04/03/2020   12:10 PM  Readmission Risk Prevention Plan  Transportation Screening Complete Complete  PCP or Specialist Appt within 3-5 Days  Complete  HRI or [place]  Complete  Social Work Consult for [place] Planning/Counseling  Complete  Palliative Care Screening  Not Applicable  Medication Review Press photographer) Referral to Pharmacy Complete  PCP or Specialist appointment within 3-5 days of discharge Complete   HRI or [place] Complete   SW Recovery Care/Counseling Consult Complete   Palliative Care Screening Not [place] Patient Refused

## 2022-09-23 NOTE — TOC Transition Note (Signed)
2 discharge meds in the Rule for patient until discharge.

## 2022-09-23 NOTE — Progress Notes (Signed)
[place] for warfarin Indication: stroke  Allergies  Allergen Reactions   Isosorbide Nitrate Anaphylaxis    Other reaction(s): Cardiovascular Arrest (ALLERGY/intolerance) Can take Sublingual Nitro.    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   Medical History: Past Medical History:  Diagnosis Date   A-fib [facility]    Anxiety    Benign prostate hyperplasia    Cancer ([place])    Kidney   Chronic kidney disease    Congestive heart failure (CHF) (HCC)    CVA (cerebral infarction)    Depression    Dyspnea    GERD (gastroesophageal reflux disease)    Gout    Hematospermia 01/13/2012   History of myocardial infarction 1993   Hyperlipidemia    Hypertension    Sleep apnea    Stroke ([place])    Vitamin B 12 deficiency    Weakness of left leg 03/28/2020    Medications:  Medications Prior to Admission  Medication Sig Dispense Refill Last Dose   albuterol (VENTOLIN HFA) 108 (90 Base) MCG/ACT inhaler Inhale 2 puffs into the lungs daily as needed for wheezing or shortness of breath.   unknown   amLODipine (NORVASC) 10 MG tablet Take 10 mg by mouth daily.   09/13/2022   apixaban (ELIQUIS) 5 MG TABS tablet Take 5 mg by mouth 2 (two) times daily.    09/13/2022 at 1600   atorvastatin (LIPITOR) 80 MG tablet Take 80 mg by mouth daily.    09/13/2022    calcitRIOL (ROCALTROL) 0.25 MCG capsule Take 0.25 mcg by mouth daily.    09/13/2022   carbamide peroxide (DEBROX) 6.5 % OTIC solution Place 5-10 drops into both ears 2 (two) times daily.   unknown   carvedilol (COREG) 25 MG tablet Take 25 mg by mouth in the morning and at bedtime.    09/13/2022 at unknown   clopidogrel (PLAVIX) 75 MG tablet Take 75 mg by mouth daily.   09/13/2022 at unknown   colchicine 0.6 MG tablet Take 0.5 tablets (0.3 mg total) by mouth 2 (two) times daily. 30 tablet 3 09/13/2022   Docusate Sodium (DSS) 100 MG CAPS Take 100 mg by mouth daily.   09/13/2022   escitalopram (LEXAPRO) 20 MG tablet Take 20 mg by mouth at bedtime.   09/12/2022   Febuxostat 80 MG TABS Take 80 mg by mouth daily.   09/13/2022   furosemide (LASIX) 40 MG tablet Take 1 tablet (40 mg total) by mouth daily. (Patient taking differently: Take 40 mg by mouth 2 (two) times daily.) 30 tablet 0 09/13/2022   hydrALAZINE (APRESOLINE) 50 MG tablet Take 1 tablet (50 mg total) by mouth 3 (three) times daily. 90 tablet 0 09/13/2022   pantoprazole (  PROTONIX) 40 MG tablet Take 40 mg by mouth daily.    09/13/2022   potassium chloride (KLOR-CON M) 10 MEQ tablet Take 10 mEq by mouth daily.   09/13/2022   primidone (MYSOLINE) 50 MG tablet Take 100 mg by mouth in the morning and at bedtime.    09/13/2022   traZODone (DESYREL) 50 MG tablet Take 25 mg by mouth at bedtime as needed for sleep.   unknown   Vitamin D, Ergocalciferol, (DRISDOL) 1.25 MG (50000 UNIT) CAPS capsule Take 50,000 Units by mouth every 7 (seven) days. Sunday   Past Week   Scheduled:   amLODipine  10 mg Oral Daily   atorvastatin  80 mg Oral Daily   calcitRIOL  0.25 mcg Oral Daily   carvedilol  12.5 mg Oral BID WC   clopidogrel  75 mg Oral Daily   colchicine  0.3 mg Oral BID   docusate sodium  100 mg Oral Daily   escitalopram  20 mg Oral QHS   febuxostat  80 mg Oral Daily   furosemide  40 mg Oral BID   pantoprazole  40 mg Oral Daily   primidone  100 mg Oral BID    Warfarin - Pharmacist Dosing Inpatient   Does not apply q1600    Assessment: 76 yo male with CVA and was on apixaban PTA for afib. Plans are to change anticoagulation due to apixaban failure. He is noted on primidone which can decrease apixaban levels. The same effect is expected for rivaroxaban. Recommendations on use of dabigatran and edoxaban with primidone vary across sources (caution or avoidance).  Best option to avoid recurrent CVA is likely warfarin.  I discussed options with patient and warfarin started on 10/3.  -INR= 1.2> 1.7  Goal of Therapy:  INR 2-3 Monitor platelets by anticoagulation protocol: Yes   Plan:  -Warfarin 2.'5mg'$  po today -Daily INR  Hildred Laser, PharmD Clinical Pharmacist **Pharmacist phone directory can now be found on [email or web address] (PW [email or web address]).  Listed under [place].

## 2022-09-24 ENCOUNTER — Other Ambulatory Visit (HOSPITAL_COMMUNITY): Payer: Self-pay

## 2022-09-24 LAB — BASIC METABOLIC PANEL
Anion gap: 9 (ref 5–15)
BUN: 32 mg/dL — ABNORMAL HIGH (ref 8–23)
CO2: 25 mmol/L (ref 22–32)
Calcium: 9.2 mg/dL (ref 8.9–10.3)
Chloride: 107 mmol/L (ref 98–111)
Creatinine, Ser: 1.55 mg/dL — ABNORMAL HIGH (ref 0.61–1.24)
GFR, Estimated: 46 mL/min — ABNORMAL LOW (ref >60)
Glucose, Bld: 81 mg/dL (ref 70–99)
Potassium: 3.6 mmol/L (ref 3.5–5.1)
Sodium: 141 mmol/L (ref 135–145)

## 2022-09-24 LAB — PROTIME-INR
INR: 2.1 — ABNORMAL HIGH (ref 0.8–1.2)
Prothrombin Time: 23.4 seconds — ABNORMAL HIGH (ref 11.4–15.2)

## 2022-09-24 MED ORDER — WARFARIN SODIUM 2.5 MG PO TABS
2.5000 mg | ORAL_TABLET | Freq: Once | ORAL | Status: AC
  Administered 2022-09-24: 2.5 mg via ORAL
  Filled 2022-09-24: qty 1

## 2022-09-24 NOTE — Progress Notes (Signed)
Mobility Specialist Progress Note:   09/24/22 1110  Mobility  Activity Ambulated with assistance in room;Transferred to/from [facility]  Activity Response Tolerated fair  Distance Ambulated (ft) 6 ft  $Mobility charge 1 Mobility  Level of Assistance Minimal assist, patient does 75% or more  Assistive Device Front wheel walker   Pt received in bed stating he has had a BM. Transferred pt to [facility]. MinA to stand, required maxA for peri-care. Left in bed with call bell in reach and all needs met.    [facility] Surveyor, mining Chat only

## 2022-09-24 NOTE — TOC Progression Note (Signed)
Transition of Care [facility]) - Progression Note    Patient Details  Name: Alexander Austin MRN: [id number] Date of Birth: 1946-04-05  Transition of Care [facility]) CM/SW [place], [place] Phone Number: 09/24/2022, 4:21 PM  Clinical Narrative:     CSW informed by [facility] patient is agreeable to SNF. Patient's VA vs Medicare benefits explained. Patient is agreeable to short term rehab at [facility].   Expected Discharge Plan: [place] Barriers to Discharge: Continued Medical Work up  Expected Discharge Plan and Services Expected Discharge Plan: [place]   Discharge Planning Services: CM Consult Post Acute Care Choice: [place] arrangements for the past 2 months: [place] Expected Discharge Date: 09/24/22               DME Arranged: [facility] DME Agency: [place] Date DME Agency Contacted: 09/23/22 Time DME Agency Contacted: 75 Representative spoke with at DME Agency: [place]: RN, PT, Social Work [employer] Agency: [place] Date [place]: 09/15/22   Representative spoke with at [place]: [place] ([place]) Interventions    Readmission Risk Interventions    07/04/2020   11:18 AM 04/03/2020   12:10 PM  Readmission Risk Prevention Plan  Transportation Screening Complete Complete  PCP or Specialist Appt within 3-5 Days  Complete  HRI or [place]  Complete  Social Work Consult for [place] Planning/Counseling  [place] Screening  Not Applicable  Medication Review Press photographer) Referral to Pharmacy Complete  PCP or Specialist appointment within 3-5 days of discharge Complete   HRI or [place] Complete   SW Recovery Care/Counseling Consult Complete   [place] Patient Refused

## 2022-09-24 NOTE — NC FL2 (Signed)
[place] LEVEL OF CARE SCREENING TOOL     IDENTIFICATION  Patient Name: Alexander Austin Birthdate: [birth year] Sex: male Admission Date (Current Location): 09/13/2022  [facility] and [place] Number:  Herbalist and Address:  The . [facility], [place] [address] [place], [place] [address]      Provider Number: [id number]  Attending Physician Name and Address:  Dessa Phi, DO  Relative Name and Phone Number:       Current Level of Care: Hospital Recommended Level of Care: [place] Prior Approval Number:    Date Approved/Denied:   PASRR Number: [id number]  Discharge Plan: SNF    Current Diagnoses: Patient Active Problem List   Diagnosis Date Noted   AICD (automatic cardioverter/defibrillator) present    Preoperative cardiovascular examination    CVA (cerebral vascular accident) ([place]) 09/13/2022   Internal carotid artery stenosis, left 09/13/2022   Skin wound from surgical incision 09/13/2022   Gout 07/03/2020   Gout flare 05/21/2020   Chronic diastolic CHF (congestive heart failure) ([place]) 03/28/2020   Acute gout 03/07/2020   Chronic kidney disease, stage 3a ([place]) 03/07/2020   Obesity (BMI 30.0-34.9) 03/07/2020   Tremor 04/16/2015   BPH (benign prostatic hyperplasia) 04/16/2015   Chronic anticoagulation 04/16/2015   Edema 04/16/2015   Urinary retention 03/06/2015   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 ([place]) 03/06/2015   NSTEMI (non-ST elevated myocardial infarction) ([place]) 03/06/2015   PAF (paroxysmal atrial fibrillation) ([place]) 03/06/2015   History of renal cell cancer 03/06/2015   Vitamin B12 deficiency 03/06/2015   Major depressive disorder, recurrent, in remission ([place]) 03/06/2015   History of stroke 03/06/2015   GERD (gastroesophageal reflux disease) 03/06/2015   Essential hypertension, benign 03/06/2015   Gout, arthritis 03/06/2015     Orientation RESPIRATION BLADDER Height & Weight     Self, Time, Situation, Place  Normal External catheter, Incontinent Weight: 247 lb 12.8 oz (112.4 kg) Height:  '6\' 1"'$  (185.4 cm)  BEHAVIORAL SYMPTOMS/MOOD NEUROLOGICAL BOWEL NUTRITION STATUS      Continent Diet (please see discharge summary)  AMBULATORY STATUS COMMUNICATION OF NEEDS Skin   Limited Assist Verbally Other (Comment) (closed incisionleft neck, wound/incision laceration puncture head)                       Personal Care Assistance Level of Assistance  Bathing, Feeding, Dressing Bathing Assistance: Limited assistance Feeding assistance: Limited assistance Dressing Assistance: Limited assistance     Functional Limitations Info  Sight, Hearing, Speech Sight Info: Adequate Hearing Info: Adequate Speech Info: Adequate    SPECIAL CARE FACTORS FREQUENCY  PT (By licensed PT), OT (By licensed OT)     PT Frequency: 5x per week OT Frequency: 5x per week            Contractures Contractures Info: Not present    Additional Factors Info  Code Status, Allergies Code Status Info: FULL Allergies Info: Isosorbide Nitrate,Shellfish           Current Medications (09/24/2022):  This is the current hospital active medication list Current Facility-Administered Medications  Medication Dose Route Frequency Provider Last Rate Last Admin   acetaminophen (TYLENOL) tablet 650 mg  650 mg Oral Q4H PRN Gabriel Earing, PA-C   650 mg at 09/22/22 2019   Or   acetaminophen (TYLENOL) 160 MG/5ML solution 650 mg  650 mg Per Tube Q4H PRN Rhyne, Hulen Shouts, PA-C  Or   acetaminophen (TYLENOL) suppository 650 mg  650 mg Rectal Q4H PRN Rhyne, Samantha J, PA-C       albuterol (PROVENTIL) (2.5 MG/3ML) 0.083% nebulizer solution 2.5 mg  2.5 mg Nebulization Daily PRN Rhyne, Samantha J, PA-C       amLODipine (NORVASC) tablet 10 mg  10 mg Oral Daily Rhyne, Samantha J, PA-C   10 mg at 09/24/22 0929   atorvastatin (LIPITOR) tablet 80  mg  80 mg Oral Daily Gabriel Earing, PA-C   80 mg at 09/24/22 2979   calcitRIOL (ROCALTROL) capsule 0.25 mcg  0.25 mcg Oral Daily Gabriel Earing, PA-C   0.25 mcg at 09/24/22 8921   carvedilol (COREG) tablet 12.5 mg  12.5 mg Oral BID WC Dessa Phi, DO   12.5 mg at 09/24/22 1941   clopidogrel (PLAVIX) tablet 75 mg  75 mg Oral Daily Angelia Mould, MD   75 mg at 09/24/22 7408   colchicine tablet 0.3 mg  0.3 mg Oral BID Gabriel Earing, PA-C   0.3 mg at 09/24/22 1448   docusate sodium (COLACE) capsule 100 mg  100 mg Oral Daily Gabriel Earing, PA-C   100 mg at 09/23/22 0956   escitalopram (LEXAPRO) tablet 20 mg  20 mg Oral QHS Rhyne, Samantha J, PA-C   20 mg at 09/23/22 2228   febuxostat (ULORIC) tablet 80 mg  80 mg Oral Daily Gabriel Earing, PA-C   80 mg at 09/24/22 1856   furosemide (LASIX) tablet 40 mg  40 mg Oral BID Dessa Phi, DO   40 mg at 09/24/22 0929   guaiFENesin-dextromethorphan (ROBITUSSIN DM) 100-10 MG/5ML syrup 15 mL  15 mL Oral Q4H PRN Rhyne, Samantha J, PA-C       hydrALAZINE (APRESOLINE) injection 10 mg  10 mg Intravenous Q6H PRN Rhyne, Samantha J, PA-C   10 mg at 09/17/22 1149   HYDROmorphone (DILAUDID) injection 0.5 mg  0.5 mg Intravenous Q3H PRN Rhyne, Samantha J, PA-C   0.5 mg at 09/20/22 1211   labetalol (NORMODYNE) injection 10 mg  10 mg Intravenous Q10 min PRN Rhyne, Samantha J, PA-C       magnesium sulfate IVPB 2 g 50 mL  2 g Intravenous Daily PRN Rhyne, Samantha J, PA-C       metoprolol tartrate (LOPRESSOR) injection 2-5 mg  2-5 mg Intravenous Q2H PRN Rhyne, Samantha J, PA-C       ondansetron (ZOFRAN) injection 4 mg  4 mg Intravenous Q6H PRN Rhyne, Samantha J, PA-C       oxyCODONE (Oxy IR/ROXICODONE) immediate release tablet 5 mg  5 mg Oral Q6H PRN Rhyne, Samantha J, PA-C   5 mg at 09/24/22 0303   pantoprazole (PROTONIX) EC tablet 40 mg  40 mg Oral Daily Rhyne, Samantha J, PA-C   40 mg at 09/24/22 0929   phenol (CHLORASEPTIC) mouth spray 1 spray   1 spray Mouth/Throat PRN Rhyne, Samantha J, PA-C       primidone (MYSOLINE) tablet 100 mg  100 mg Oral BID Rhyne, Samantha J, PA-C   100 mg at 09/24/22 3149   traZODone (DESYREL) tablet 25 mg  25 mg Oral QHS PRN Gabriel Earing, PA-C   25 mg at 09/23/22 2228   warfarin (COUMADIN) tablet 2.5 mg  2.5 mg Oral ONCE-1600 Kris Mouton, [facility]       Warfarin - Pharmacist Dosing Inpatient   Does not apply q1600 Kris Mouton, [facility]  Discharge Medications: Please see discharge summary for a list of discharge medications.  Relevant Imaging Results:  Relevant Lab Results:   Additional Information SSN [id number]  Vinie Sill, LCSW

## 2022-09-24 NOTE — Progress Notes (Signed)
Physical Therapy Treatment Patient Details Name: Alexander Austin MRN: [id number] DOB: 19-Feb-1946 Today's Date: 09/24/2022   History of Present Illness Pt is a 76 y/o male who presented with R UE weakness and unsteady gait. MRI showed small acute to subacute lt frontoparietal infarct. Pt underwent lt carotid endarectomy on 09/20/22.  PMH: CHF, CAD s/p CABG x 3, ICD, PPM, a fib, pulmonary hypertension, CKD 4, hx of renal cell cancer s/p nephrectomy.    PT Comments    Pt is still requiring assist for basic mobility and fatigues quickly. Pt lives alone and will only have intermittent assistance. Feel pt need ST-SNF to improve mobility to a level he can return home alone. Pt in agreement.    Recommendations for follow up therapy are one component of a multi-disciplinary discharge planning process, led by the attending physician.  Recommendations may be updated based on patient status, additional functional criteria and insurance authorization.  Follow Up Recommendations  Skilled nursing-short term rehab (<3 hours/day) Can patient physically be transported by private vehicle: Yes   Assistance Recommended at Discharge Intermittent Supervision/Assistance  Patient can return home with the following A little help with bathing/dressing/bathroom;A little help with walking and/or transfers;Assistance with cooking/housework;Help with stairs or ramp for entrance;Assist for transportation   Equipment Recommendations  None recommended by PT    Recommendations for Other Services       Precautions / Restrictions Precautions Precautions: Fall Restrictions Weight Bearing Restrictions: No     Mobility  Bed Mobility Overal bed mobility: Needs Assistance Bed Mobility: Supine to Sit     Supine to sit: Min guard, HOB elevated     General bed mobility comments: Incr time to perform and use of rail.    Transfers Overall transfer level: Needs assistance Equipment used: Rollator (4 wheels) Transfers:  Sit to/from Stand Sit to Stand: Min assist           General transfer comment: Assist to bring hips up and for balance.    Ambulation/Gait Ambulation/Gait assistance: Min assist Gait Distance (Feet): 100 Feet Assistive device: Rollator (4 wheels) Gait Pattern/deviations: Step-through pattern, Decreased stride length, Wide base of support, Trunk flexed Gait velocity: Decreased Gait velocity interpretation: <1.8 ft/sec, indicate of risk for recurrent falls   General Gait Details: Assist for balance. Verbal cues to stand more erect. Pt with 2 standing rest breaks.   Stairs             Wheelchair Mobility    Modified Rankin (Stroke Patients Only)       Balance Overall balance assessment: Needs assistance Sitting-balance support: Feet supported, No upper extremity supported Sitting balance-Leahy Scale: Good     Standing balance support: Bilateral upper extremity supported Standing balance-Leahy Scale: Poor Standing balance comment: rollator and min guard for static standing                            Cognition Arousal/Alertness: Awake/alert Behavior During Therapy: WFL for tasks assessed/performed Overall Cognitive Status: History of cognitive impairments - at baseline                                 General Comments: decreased safety awareness        Exercises      General Comments General comments (skin integrity, edema, etc.): SpO2 96% on RA at rest and 92% after amb      Pertinent Vitals/Pain  Pain Assessment Pain Assessment: Faces Faces Pain Scale: Hurts little more Pain Location: back Pain Descriptors / Indicators: Sore, Tightness Pain Intervention(s): Limited activity within patient's tolerance, Repositioned    Home Living                          Prior Function            PT Goals (current goals can now be found in the care plan section) Progress towards PT goals: Not progressing toward goals -  comment    Frequency    Min 3X/week      PT Plan Discharge plan needs to be updated;Frequency needs to be updated    Co-evaluation              AM-PAC PT "6 Clicks" Mobility   Outcome Measure  Help needed turning from your back to your side while in a flat bed without using bedrails?: A Little Help needed moving from lying on your back to sitting on the side of a flat bed without using bedrails?: A Little Help needed moving to and from a bed to a chair (including a wheelchair)?: A Little Help needed standing up from a chair using your arms (e.g., wheelchair or bedside chair)?: A Little Help needed to walk in hospital room?: A Little Help needed climbing 3-5 steps with a railing? : A Lot 6 Click Score: 17    End of Session Equipment Utilized During Treatment: Gait belt Activity Tolerance: Patient limited by fatigue Patient left: in chair;with call bell/phone within reach;with chair alarm set Nurse Communication: Mobility status PT Visit Diagnosis: Muscle weakness (generalized) (M62.81);Other abnormalities of gait and mobility (R26.89)     Time: 9629-5284 PT Time Calculation (min) (ACUTE ONLY): 24 min  Charges:  $Gait Training: 23-37 mins                     [place] [facility] [place] 09/24/2022, 3:03 PM

## 2022-09-24 NOTE — Progress Notes (Signed)
[place] for warfarin Indication: stroke  Allergies  Allergen Reactions   Isosorbide Nitrate Anaphylaxis    Other reaction(s): Cardiovascular Arrest (ALLERGY/intolerance) Can take Sublingual Nitro.    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    Patient Measurements: Height: '6\' 1"'$  (185.4 cm) Weight: 112.4 kg (247 lb 12.8 oz) IBW/kg (Calculated) : 79.9   Vital Signs: Temp: 98.4 F (36.9 C) (10/06 0356) Temp Source: Oral (10/06 0356) BP: 154/52 (10/06 0356) Pulse Rate: 58 (10/06 0356)  Labs: Recent Labs    09/22/22 0125 09/23/22 0127 09/24/22 0112  LABPROT 15.3* 20.0* 23.4*  INR 1.2 1.7* 2.1*  CREATININE 1.67* 1.54* 1.55*     Estimated Creatinine Clearance: 52.4 mL/min (A) (by C-G formula based on SCr of 1.55 mg/dL (H)).   Medical History: Past Medical History:  Diagnosis Date   A-fib [facility])    Anxiety    Benign prostate hyperplasia    Cancer ([place])    Kidney   Chronic kidney disease    Congestive heart failure (CHF) (HCC)    CVA (cerebral infarction)    Depression    Dyspnea    GERD (gastroesophageal reflux disease)    Gout    Hematospermia 01/13/2012   History of myocardial infarction 1993   Hyperlipidemia    Hypertension    Sleep apnea    Stroke ([place])    Vitamin B 12 deficiency    Weakness of left leg 03/28/2020    Medications:  Medications Prior to Admission  Medication Sig Dispense Refill Last Dose   albuterol (VENTOLIN HFA) 108 (90 Base) MCG/ACT inhaler Inhale 2 puffs into the lungs daily as needed for wheezing or shortness of breath.   unknown   amLODipine (NORVASC) 10 MG tablet Take 10 mg by mouth daily.   09/13/2022   apixaban (ELIQUIS) 5 MG TABS tablet Take 5 mg by mouth 2 (two) times daily.    09/13/2022 at 1600   atorvastatin (LIPITOR) 80 MG tablet Take 80 mg by mouth daily.    09/13/2022   calcitRIOL (ROCALTROL) 0.25 MCG capsule Take 0.25 mcg by  mouth daily.    09/13/2022   carbamide peroxide (DEBROX) 6.5 % OTIC solution Place 5-10 drops into both ears 2 (two) times daily.   unknown   clopidogrel (PLAVIX) 75 MG tablet Take 75 mg by mouth daily.   09/13/2022 at unknown   colchicine 0.6 MG tablet Take 0.5 tablets (0.3 mg total) by mouth 2 (two) times daily. 30 tablet 3 09/13/2022   Docusate Sodium (DSS) 100 MG CAPS Take 100 mg by mouth daily.   09/13/2022   escitalopram (LEXAPRO) 20 MG tablet Take 20 mg by mouth at bedtime.   09/12/2022   Febuxostat 80 MG TABS Take 80 mg by mouth daily.   09/13/2022   furosemide (LASIX) 40 MG tablet Take 1 tablet (40 mg total) by mouth daily. (Patient taking differently: Take 40 mg by mouth 2 (two) times daily.) 30 tablet 0 09/13/2022   hydrALAZINE (APRESOLINE) 50 MG tablet Take 1 tablet (50 mg total) by mouth 3 (three) times daily. 90 tablet 0 09/13/2022   pantoprazole (PROTONIX) 40 MG tablet Take 40 mg by mouth daily.    09/13/2022   potassium chloride (KLOR-CON M) 10 MEQ tablet Take 10 mEq by mouth daily.   09/13/2022   primidone (MYSOLINE) 50 MG tablet Take 100 mg by mouth in the morning and at bedtime.    09/13/2022  traZODone (DESYREL) 50 MG tablet Take 25 mg by mouth at bedtime as needed for sleep.   unknown   Vitamin D, Ergocalciferol, (DRISDOL) 1.25 MG (50000 UNIT) CAPS capsule Take 50,000 Units by mouth every 7 (seven) days. Sunday   Past Week   [DISCONTINUED] carvedilol (COREG) 25 MG tablet Take 25 mg by mouth in the morning and at bedtime.    09/13/2022 at unknown   Scheduled:   amLODipine  10 mg Oral Daily   atorvastatin  80 mg Oral Daily   calcitRIOL  0.25 mcg Oral Daily   carvedilol  12.5 mg Oral BID WC   clopidogrel  75 mg Oral Daily   colchicine  0.3 mg Oral BID   docusate sodium  100 mg Oral Daily   escitalopram  20 mg Oral QHS   febuxostat  80 mg Oral Daily   furosemide  40 mg Oral BID   pantoprazole  40 mg Oral Daily   primidone  100 mg Oral BID   Warfarin - Pharmacist Dosing Inpatient    Does not apply q1600    Assessment: 76 yo male with CVA and was on apixaban PTA for afib. Plans are to change anticoagulation due to apixaban failure. He is noted on primidone which can decrease apixaban levels. The same effect is expected for rivaroxaban. Recommendations on use of dabigatran and edoxaban with primidone vary across sources (caution or avoidance).  Best option to avoid recurrent CVA is likely warfarin.  I discussed options with patient and warfarin started on 10/3.  -INR= 2.1  Goal of Therapy:  INR 2-3 Monitor platelets by anticoagulation protocol: Yes   Plan:  -Warfarin 2.'5mg'$  po daily -Daily INR  Hildred Laser, PharmD Clinical Pharmacist **Pharmacist phone directory can now be found on [email or web address] (PW [email or web address]).  Listed under [place].

## 2022-09-24 NOTE — Progress Notes (Addendum)
  PROGRESS NOTE  Patient on room air this morning. No acute events. No significant lower extremity edema. Ready for discharge home with home health services today.   Addendum: repeat PT evaluation today with change in recommendation to SNF. TOC aware. Hold discharge until placement found.    Dessa Phi, DO [facility] 09/24/2022, 10:28 AM  Available via Epic secure chat 7am-7pm After these hours, please refer to coverage provider listed on [email or web address]

## 2022-09-24 NOTE — TOC Progression Note (Signed)
Transition of Care (TOC) - Progression Note  Marvetta Gibbons RN, BSN Transitions of Care Unit 4E- RN Case Manager See Treatment Team for direct phone #    Patient Details  Name: Alexander Austin MRN: [id number] Date of Birth: 03/07/1946  Transition of Care [facility]) CM/SW Contact  Dahlia Client, Romeo Rabon, RN Phone Number: 09/24/2022, 3:16 PM  Clinical Narrative:    CM reached out to [facility] to check on home 02 approval- msg left for return call.  Per MD pt medically stable for transition home today.  1130- received return call from [place]- home 02 showing in system- pending, request that paperwork also be faxed to 02 clinic in [place] as they are saying they can not see there referral in system. Paperwork faxed as per request to [phone]  1:30- received call from [place] at [facility], home 02 order has been processed and they are working with [facility] to deliver 02 today. Will have [place] reach out to [facility] for delivery.   CM spoke with [facility] who voiced concerns that pt still unable to ambulate much or take care of his basic needs. Adela Lank is questioning if pt may need rehab prior to return home- msg has been sent to PT that is going to see pt today to re-eval pt's mobility and safe discharge plan.   1500- PT has seen pt and updated recs to SNF, pt agrees, does not feel he is strong enough to return home at this time. Agreeable to SNF search- reports that he also has Medicare coverage, usually uses [facility] benefits, explained narrow network with VA for SNF rehab options, pt agreeable to SNF search and will provide bed offers so pt can make decision about using his Medicare vs VA benefits. CSW aware. MD updated and d/c has been cancelled for now.   CM has updated the [place] home 02 clinic- and they will hold delivery for now, Adela Lank also updated that d/c cancelled for today and we will look for SNF rehab bed.   TOC to continue to follow.      Expected Discharge Plan:  [employer] Barriers to Discharge: Continued Medical Work up  Expected Discharge Plan and Services Expected Discharge Plan: [place]   Discharge Planning Services: CM Consult Post Acute Care Choice: [place] arrangements for the past 2 months: [place] Expected Discharge Date: 09/24/22               DME Arranged: Oxygen DME Agency: [place] Date DME Agency Contacted: 09/23/22 Time DME Agency Contacted: 4 Representative spoke with at DME Agency: [place]: RN, PT, Social Work [employer] Agency: [place] Date [place]: 09/15/22   Representative spoke with at [place]: [place] ([place]) Interventions    Readmission Risk Interventions    07/04/2020   11:18 AM 04/03/2020   12:10 PM  Readmission Risk Prevention Plan  Transportation Screening Complete Complete  PCP or Specialist Appt within 3-5 Days  Complete  HRI or [place]  Complete  Social Work Consult for [place] Planning/Counseling  [place] Screening  Not Applicable  Medication Review Press photographer) Referral to Pharmacy Complete  PCP or Specialist appointment within 3-5 days of discharge Complete   HRI or [place] Complete   SW Recovery Care/Counseling Consult Complete   Palliative Care Screening Not [name] Patient  Refused

## 2022-09-25 DIAGNOSIS — I63032 Cerebral infarction due to thrombosis of left carotid artery: Secondary | ICD-10-CM | POA: Diagnosis not present

## 2022-09-25 LAB — BASIC METABOLIC PANEL WITH GFR
Anion gap: 8 (ref 5–15)
BUN: 27 mg/dL — ABNORMAL HIGH (ref 8–23)
CO2: 27 mmol/L (ref 22–32)
Calcium: 9 mg/dL (ref 8.9–10.3)
Chloride: 106 mmol/L (ref 98–111)
Creatinine, Ser: 1.78 mg/dL — ABNORMAL HIGH (ref 0.61–1.24)
GFR, Estimated: 39 mL/min — ABNORMAL LOW
Glucose, Bld: 98 mg/dL (ref 70–99)
Potassium: 3.9 mmol/L (ref 3.5–5.1)
Sodium: 141 mmol/L (ref 135–145)

## 2022-09-25 LAB — PROTIME-INR
INR: 2.3 — ABNORMAL HIGH (ref 0.8–1.2)
Prothrombin Time: 24.8 seconds — ABNORMAL HIGH (ref 11.4–15.2)

## 2022-09-25 MED ORDER — WARFARIN SODIUM 2.5 MG PO TABS
2.5000 mg | ORAL_TABLET | Freq: Once | ORAL | Status: AC
  Administered 2022-09-25: 2.5 mg via ORAL
  Filled 2022-09-25: qty 1

## 2022-09-25 MED ORDER — FUROSEMIDE 40 MG PO TABS
40.0000 mg | ORAL_TABLET | Freq: Two times a day (BID) | ORAL | Status: DC
  Filled 2022-09-25 (×2): qty 1

## 2022-09-25 NOTE — Progress Notes (Signed)
PROGRESS NOTE    Alexander Austin  YQM:[id number] DOB: 1946-03-08 DOA: 09/13/2022 PCP: Windy Fast, MD     Brief Narrative:  Alexander Austin is a 76 year old male with medical history of chronic systolic heart failure, CAD s/p CABG x3 with ICD and pacemaker, paroxysmal atrial fibrillation on Eliquis, CVA, pulmonary hypertension, CKD stage IV with history of renal cell cancer s/p nephrectomy presented with right-sided weakness.  Patient takes Plavix and Eliquis at home. In the ED he was hypertensive blood pressure 160/64. Code stroke was called, CT head showed small cortical/subcortical infarct in the left frontal lobe that was new from 2015.  CTA head and neck showed no large vessel occlusion but had severe stenosis of the origin of left ICA greater than 80%. Patient was not a candidate for TNKase, since he is on Eliquis.  Neurology was consulted. vascular surgery consulted, patient underwent left carotid endarterectomy.  Patient worked with physical therapy who recommended home health.  He had episodes of nocturnal desaturations, his home Lasix was resumed.  He stated that he had had a sleep study in the past, has a CPAP machine at home that he does not use.  He was supposed to discharge home 10/5 but was delayed due to home oxygen order.  Further PT evaluation change recommendation from home health to SNF placement.  New events last 24 hours / Subjective: No new issues.  Awaiting SNF placement.  Continues to have lower extremity weakness.  Assessment & Plan:   Principal Problem:   CVA (cerebral vascular accident) ([place]) Active Problems:   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 (HCC)   PAF (paroxysmal atrial fibrillation) ([place])   Essential hypertension, benign   Chronic kidney disease, stage 3a ([place])   Obesity (BMI 30.0-34.9)   Internal carotid artery stenosis, left   Skin wound from surgical incision   AICD (automatic  cardioverter/defibrillator) present   Preoperative cardiovascular examination  CVA -CT head showed small cortical/subcortical infarct within the mid to posterior left frontal lobe, left MCA/ACA watershed territory, new from prior head CT of 09/07/2014 -CTA head and neck showed severe stenosis of the origin of left ICA greater than 80%; carotid duplex obtained which shows 60 to 79% left ICA stenosis.   -MRI brain showed small acute to early subacute left frontoparietal infarct, chronic ischemia with multiple chronic infarcts -Vascular surgery consulted, patient underwent left carotid endarterectomy 10/2 -Neuro surgery signed off -Eliquis had interaction with primidone so Eliquis was not restarted. Edoxaban will require preauthorization from [place]. Thus started on coumadin  -Lipitor    OSA -Patient desaturates to 70 to 80% overnight.  Patient states that he has had a sleep study and has a CPAP at home, which he does not wear   Acute on chronic diastolic CHF -Echocardiogram from 09/14/2022 showed EF of 50 to 52%, grade 2 diastolic dysfunction -Continue Lasix, hold dose today due to slight elevation of creatinine   Chronic kidney disease stage IIIa -Stable   Hypertension -Continue amlodipine, Coreg    Paroxysmal atrial fibrillation -Found out that patient was on Eliquis and primidone and primidone interacts with Eliquis and makes it less effective; which may explain that patient had stroke while being on Eliquis. Eliquis has been discontinued -Warfarin and Plavix have been restarted   CAD s/p CABG -Continue Plavix     DVT prophylaxis:  SCD's Start: 09/20/22 1507 warfarin (COUMADIN) tablet 2.5 mg  Code Status: Partial code, DNI Family  Communication: No family at bedside Disposition Plan:  Status is: Inpatient Remains inpatient appropriate because: SNF placement pending   Antimicrobials:  Anti-infectives (From admission, onward)    Start     Dose/Rate Route Frequency  Ordered Stop   09/20/22 1400  ceFAZolin (ANCEF) IVPB 2g/100 mL premix        2 g 200 mL/hr over 30 Minutes Intravenous Every 8 hours 09/20/22 1346 09/20/22 2301   09/20/22 0600  ceFAZolin (ANCEF) IVPB 2g/100 mL premix  Status:  Discontinued       Note to Pharmacy: Send with pt to OR   2 g 200 mL/hr over 30 Minutes Intravenous On call 09/19/22 0726 09/20/22 1506        Objective: Vitals:   09/24/22 1958 09/24/22 2337 09/25/22 0338 09/25/22 0756  BP: (!) 162/72 (!) 157/68 (!) 176/56 (!) 162/82  Pulse: 66 61 60 60  Resp: '20 20 18 18  '$ Temp: (!) 97.5 F (36.4 C) 97.9 F (36.6 C) 98.6 F (37 C) 98.2 F (36.8 C)  TempSrc: Oral Oral Oral Oral  SpO2: 98% 99% 98% 96%  Weight:      Height:        Intake/Output Summary (Last 24 hours) at 09/25/2022 1103 Last data filed at 09/25/2022 0757 Gross per 24 hour  Intake 540 ml  Output 1600 ml  Net -1060 ml    Filed Weights   09/13/22 1820  Weight: 112.4 kg    Examination:  General exam: Appears calm and comfortable  Respiratory system: Clear to auscultation. Respiratory effort normal. No respiratory distress. No conversational dyspnea. On room air  Cardiovascular system: S1 & S2 heard, RRR. No murmurs. No pedal edema. Gastrointestinal system: Abdomen is nondistended, soft and nontender. Normal bowel sounds heard. Central nervous system: Alert and oriented. No focal neurological deficits. Speech clear.  Extremities: Symmetric in appearance  Skin: No rashes, lesions or ulcers on exposed skin  Psychiatry: Judgement and insight appear normal. Mood & affect appropriate.   Data Reviewed: I have personally reviewed following labs and imaging studies  CBC: Recent Labs  Lab 09/19/22 0720 09/20/22 0248 09/21/22 0500  WBC 3.9* 3.7* 6.1  HGB 11.0* 10.8* 10.3*  HCT 31.7* 31.0* 30.8*  MCV 89.3 89.9 93.3  PLT 147* 142* 142*    Basic Metabolic Panel: Recent Labs  Lab 09/21/22 0500 09/22/22 0125 09/23/22 0127 09/24/22 0112  09/25/22 0113  NA 139 138 140 141 141  K 4.9 4.0 3.8 3.6 3.9  CL 110 110 109 107 106  CO2 22 21* '24 25 27  '$ GLUCOSE 102* 101* 90 81 98  BUN 27* 29* 33* 32* 27*  CREATININE 1.61* 1.67* 1.54* 1.55* 1.78*  CALCIUM 8.6* 8.4* 8.6* 9.2 9.0    GFR: Estimated Creatinine Clearance: 45.7 mL/min (A) (by C-G formula based on SCr of 1.78 mg/dL (H)). Liver Function Tests: No results for input(s): "AST", "ALT", "ALKPHOS", "BILITOT", "PROT", "ALBUMIN" in the last 168 hours. No results for input(s): "LIPASE", "AMYLASE" in the last 168 hours. No results for input(s): "AMMONIA" in the last 168 hours. Coagulation Profile: Recent Labs  Lab 09/22/22 0125 09/23/22 0127 09/24/22 0112 09/25/22 0113  INR 1.2 1.7* 2.1* 2.3*    Cardiac Enzymes: No results for input(s): "CKTOTAL", "CKMB", "CKMBINDEX", "TROPONINI" in the last 168 hours. BNP (last 3 results) No results for input(s): "PROBNP" in the last 8760 hours. HbA1C: No results for input(s): "HGBA1C" in the last 72 hours. CBG: No results for input(s): "GLUCAP" in the last 168 hours.  Lipid Profile: No results for input(s): "CHOL", "HDL", "LDLCALC", "TRIG", "CHOLHDL", "LDLDIRECT" in the last 72 hours. Thyroid Function Tests: No results for input(s): "TSH", "T4TOTAL", "FREET4", "T3FREE", "THYROIDAB" in the last 72 hours. Anemia Panel: No results for input(s): "VITAMINB12", "FOLATE", "FERRITIN", "TIBC", "IRON", "RETICCTPCT" in the last 72 hours. Sepsis Labs: No results for input(s): "PROCALCITON", "LATICACIDVEN" in the last 168 hours.  Recent Results (from the past 240 hour(s))  Surgical pcr screen     Status: None   Collection Time: 09/20/22  4:42 AM   Specimen: Nasal Mucosa; Nasal Swab  Result Value Ref Range Status   MRSA, PCR NEGATIVE NEGATIVE Final   Staphylococcus aureus NEGATIVE NEGATIVE Final    Comment: (NOTE) The Xpert SA Assay (FDA approved for NASAL specimens in patients 79 years of age and older), is one component of a  comprehensive surveillance program. It is not intended to diagnose infection nor to guide or monitor treatment. Performed at [facility], [place] [address], [place], [place] [address]       Radiology Studies: No results found.    Scheduled Meds:  amLODipine  10 mg Oral Daily   atorvastatin  80 mg Oral Daily   calcitRIOL  0.25 mcg Oral Daily   carvedilol  12.5 mg Oral BID WC   clopidogrel  75 mg Oral Daily   colchicine  0.3 mg Oral BID   docusate sodium  100 mg Oral Daily   escitalopram  20 mg Oral QHS   febuxostat  80 mg Oral Daily   [START ON 09/26/2022] furosemide  40 mg Oral BID   pantoprazole  40 mg Oral Daily   primidone  100 mg Oral BID   warfarin  2.5 mg Oral ONCE-1600   Warfarin - Pharmacist Dosing Inpatient   Does not apply q1600   Continuous Infusions:  magnesium sulfate bolus IVPB       LOS: 11 days     Dessa Phi, DO [facility] 09/25/2022, 11:03 AM   Available via Epic secure chat 7am-7pm After these hours, please refer to coverage provider listed on [email or web address]

## 2022-09-25 NOTE — TOC Progression Note (Signed)
Transition of Care [facility]) - Progression Note    Patient Details  Name: Alexander Austin MRN: [id number] Date of Birth: 09-Nov-1946  Transition of Care [facility]) CM/SW [place], [place] Phone Number: 09/25/2022, 3:11 PM  Clinical Narrative:     CSW spoke with Wille Glaser and Freda Munro, requested Medicare number for pt, Freda Munro states pt's wallet is at home. She request CSW to call her tomorrow morning, states she can provide the number then.   Expected Discharge Plan: [place] Barriers to Discharge: Continued Medical Work up  Expected Discharge Plan and Services Expected Discharge Plan: [place]   Discharge Planning Services: CM Consult Post Acute Care Choice: [place] arrangements for the past 2 months: [place] Expected Discharge Date: 09/24/22               DME Arranged: Oxygen DME Agency: [place] Date DME Agency Contacted: 09/23/22 Time DME Agency Contacted: 37 Representative spoke with at DME Agency: [place]: RN, PT, Social Work [employer] Agency: [place] Date [place]: 09/15/22   Representative spoke with at [place]: [place] ([place]) Interventions    Readmission Risk Interventions    07/04/2020   11:18 AM 04/03/2020   12:10 PM  Readmission Risk Prevention Plan  Transportation Screening Complete Complete  PCP or Specialist Appt within 3-5 Days  Complete  HRI or [place]  Complete  Social Work Consult for [place] Planning/Counseling  [place] Screening  Not Applicable  Medication Review Press photographer) Referral to Pharmacy Complete  PCP or Specialist appointment within 3-5 days of discharge Complete   HRI or [place] Complete   SW Recovery Care/Counseling Consult Complete   [place] Patient Refused

## 2022-09-25 NOTE — Progress Notes (Addendum)
Alexander Austin for warfarin Indication: stroke  Allergies  Allergen Reactions   Isosorbide Nitrate Anaphylaxis    Other reaction(s): Cardiovascular Arrest (ALLERGY/intolerance) Can take Sublingual Nitro.    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   Medical History: Past Medical History:  Diagnosis Date   A-fib [facility])    Anxiety    Benign prostate hyperplasia    Cancer ([place])    Kidney   Chronic kidney disease    Congestive heart failure (CHF) (HCC)    CVA (cerebral infarction)    Depression    Dyspnea    GERD (gastroesophageal reflux disease)    Gout    Hematospermia 01/13/2012   History of myocardial infarction 1993   Hyperlipidemia    Hypertension    Sleep apnea    Stroke ([place])    Vitamin B 12 deficiency    Weakness of left leg 03/28/2020    Medications:  Medications Prior to Admission  Medication Sig Dispense Refill Last Dose   albuterol (VENTOLIN HFA) 108 (90 Base) MCG/ACT inhaler Inhale 2 puffs into the lungs daily as needed for wheezing or shortness of breath.   unknown   amLODipine (NORVASC) 10 MG tablet Take 10 mg by mouth daily.   09/13/2022   apixaban (ELIQUIS) 5 MG TABS tablet Take 5 mg by mouth 2 (two) times daily.    09/13/2022 at 1600   atorvastatin (LIPITOR) 80 MG tablet Take 80 mg by mouth daily.    09/13/2022   calcitRIOL (ROCALTROL) 0.25 MCG capsule Take 0.25 mcg by mouth  daily.    09/13/2022   carbamide peroxide (DEBROX) 6.5 % OTIC solution Place 5-10 drops into both ears 2 (two) times daily.   unknown   clopidogrel (PLAVIX) 75 MG tablet Take 75 mg by mouth daily.   09/13/2022 at unknown   colchicine 0.6 MG tablet Take 0.5 tablets (0.3 mg total) by mouth 2 (two) times daily. 30 tablet 3 09/13/2022   Docusate Sodium (DSS) 100 MG CAPS Take 100 mg by mouth daily.   09/13/2022   escitalopram (LEXAPRO) 20 MG tablet Take 20 mg by mouth at bedtime.   09/12/2022   Febuxostat 80 MG TABS Take 80 mg by mouth daily.   09/13/2022   furosemide (LASIX) 40 MG tablet Take 1 tablet (40 mg total) by mouth daily. (Patient taking differently: Take 40 mg by mouth 2 (two) times daily.) 30 tablet 0 09/13/2022   hydrALAZINE (APRESOLINE) 50 MG tablet Take 1 tablet (50 mg total) by mouth 3 (three) times daily. 90 tablet 0 09/13/2022   pantoprazole (PROTONIX) 40 MG tablet Take 40 mg by mouth daily.    09/13/2022   potassium chloride (KLOR-CON M) 10 MEQ tablet Take 10 mEq by mouth daily.   09/13/2022   primidone (MYSOLINE) 50 MG tablet Take 100 mg by mouth in the morning and at bedtime.    09/13/2022  traZODone (DESYREL) 50 MG tablet Take 25 mg by mouth at bedtime as needed for sleep.   unknown   Vitamin D, Ergocalciferol, (DRISDOL) 1.25 MG (50000 UNIT) CAPS capsule Take 50,000 Units by mouth every 7 (seven) days. Sunday   Past Week   [DISCONTINUED] carvedilol (COREG) 25 MG tablet Take 25 mg by mouth in the morning and at bedtime.    09/13/2022 at unknown   Scheduled:   amLODipine  10 mg Oral Daily   atorvastatin  80 mg Oral Daily   calcitRIOL  0.25 mcg Oral Daily   carvedilol  12.5 mg Oral BID WC   clopidogrel  75 mg Oral Daily   colchicine  0.3 mg Oral BID   docusate sodium  100 mg Oral Daily   escitalopram  20 mg Oral QHS   febuxostat  80 mg Oral Daily   [START ON 09/26/2022] furosemide  40 mg Oral BID   pantoprazole  40 mg Oral Daily   primidone  100 mg Oral BID   Warfarin - Pharmacist  Dosing Inpatient   Does not apply q1600    Assessment: 76 yo male with CVA and was on apixaban PTA for afib. Plans are to change anticoagulation due to apixaban failure. He is noted on primidone which can decrease apixaban levels. The same effect is expected for rivaroxaban. Recommendations on use of dabigatran and edoxaban with primidone vary across sources (caution or avoidance).  Best option to avoid recurrent CVA is likely warfarin.  Warfarin started on 10/3. Received 2.5 mg x1 on 10/7. INR today of 2.3 therapeutic increased from INR yesterday of 2.1. PLT 142, Hgb 10.3 stable. No s/sx bleeding noted in chart. Patient likely discharge soon.   Goal of Therapy:  INR 2-3 Monitor platelets by anticoagulation protocol: Yes   Plan:  Give warfarin 2.5 mg x1  Obtain daily INR while admitted     Gena Fray, PharmD PGY1 Pharmacy Resident   09/25/2022 8:29 AM  **Pharmacist phone directory can now be found on [email or web address] ([email or web address]).  Listed under [place].

## 2022-09-25 NOTE — Progress Notes (Signed)
Mobility Specialist - Progress Note   09/25/22 1356  Mobility  Activity Ambulated with assistance in hallway  Activity Response Tolerated well  Distance Ambulated (ft) 60 ft  $Mobility charge 1 Mobility  Level of Assistance Minimal assist, patient does 75% or more  Assistive Device Front wheel walker  Mobility Referral Yes   Pt was received in chair and agreeable to mobility. Session was limited d/t pt feeling exerted. Pt was returned to chair with all needs met and chair alarm on.   Larey Seat

## 2022-09-26 DIAGNOSIS — I639 Cerebral infarction, unspecified: Secondary | ICD-10-CM | POA: Diagnosis not present

## 2022-09-26 LAB — BASIC METABOLIC PANEL
Anion gap: 7 (ref 5–15)
BUN: 32 mg/dL — ABNORMAL HIGH (ref 8–23)
CO2: 24 mmol/L (ref 22–32)
Calcium: 8.6 mg/dL — ABNORMAL LOW (ref 8.9–10.3)
Chloride: 104 mmol/L (ref 98–111)
Creatinine, Ser: 2.01 mg/dL — ABNORMAL HIGH (ref 0.61–1.24)
GFR, Estimated: 34 mL/min — ABNORMAL LOW (ref >60)
Glucose, Bld: 106 mg/dL — ABNORMAL HIGH (ref 70–99)
Potassium: 3.6 mmol/L (ref 3.5–5.1)
Sodium: 135 mmol/L (ref 135–145)

## 2022-09-26 LAB — CBC
HCT: 33 % — ABNORMAL LOW (ref 39.0–52.0)
Hemoglobin: 11.6 g/dL — ABNORMAL LOW (ref 13.0–17.0)
MCH: 30.9 pg (ref 26.0–34.0)
MCHC: 35.2 g/dL (ref 30.0–36.0)
MCV: 88 fL (ref 80.0–100.0)
Platelets: 173 10*3/uL (ref 150–400)
RBC: 3.75 MIL/uL — ABNORMAL LOW (ref 4.22–5.81)
RDW: 15 % (ref 11.5–15.5)
WBC: 6.7 10*3/uL (ref 4.0–10.5)
nRBC: 0 % (ref 0.0–0.2)

## 2022-09-26 LAB — PROTIME-INR
INR: 2.4 — ABNORMAL HIGH (ref 0.8–1.2)
Prothrombin Time: 25.7 seconds — ABNORMAL HIGH (ref 11.4–15.2)

## 2022-09-26 MED ORDER — SODIUM CHLORIDE 0.9 % IV SOLN: INTRAVENOUS | Status: AC

## 2022-09-26 MED ORDER — WARFARIN SODIUM 2.5 MG PO TABS
2.5000 mg | ORAL_TABLET | Freq: Once | ORAL | Status: AC
  Administered 2022-09-26: 2.5 mg via ORAL
  Filled 2022-09-26: qty 1

## 2022-09-26 NOTE — Progress Notes (Signed)
Pt ambulated approximately 150' with four wheel walker from home, RA.  Took a few rest breaks, but ultimately walk cut short only due to urgent need for bathroom.

## 2022-09-26 NOTE — Progress Notes (Signed)
Pt refusing recliner this morning, said he didn't rest well last night and would like to try to sleep a while longer.  Will attempt again before lunch.

## 2022-09-26 NOTE — Progress Notes (Signed)
[place] for warfarin Indication: stroke  Allergies  Allergen Reactions   Isosorbide Nitrate Anaphylaxis    Other reaction(s): Cardiovascular Arrest (ALLERGY/intolerance) Can take Sublingual Nitro.    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   Medical History: Past Medical History:  Diagnosis Date   A-fib [facility])    Anxiety    Benign prostate hyperplasia    Cancer ([place])    Kidney   Chronic kidney disease    Congestive heart failure (CHF) (HCC)    CVA (cerebral infarction)    Depression    Dyspnea    GERD (gastroesophageal reflux disease)    Gout    Hematospermia 01/13/2012   History of myocardial infarction 1993   Hyperlipidemia    Hypertension    Sleep apnea    Stroke ([place])    Vitamin B 12 deficiency    Weakness of left leg 03/28/2020    Medications:  Medications Prior to Admission  Medication Sig Dispense Refill Last Dose   albuterol (VENTOLIN HFA) 108 (90 Base) MCG/ACT inhaler Inhale 2 puffs into the lungs daily as needed for wheezing or shortness of breath.   unknown   amLODipine (NORVASC) 10 MG tablet Take 10 mg by mouth daily.   09/13/2022   apixaban (ELIQUIS) 5 MG TABS tablet Take 5 mg by mouth 2 (two) times daily.    09/13/2022 at 1600   atorvastatin (LIPITOR) 80 MG tablet Take 80 mg by mouth daily.    09/13/2022    calcitRIOL (ROCALTROL) 0.25 MCG capsule Take 0.25 mcg by mouth daily.    09/13/2022   carbamide peroxide (DEBROX) 6.5 % OTIC solution Place 5-10 drops into both ears 2 (two) times daily.   unknown   clopidogrel (PLAVIX) 75 MG tablet Take 75 mg by mouth daily.   09/13/2022 at unknown   colchicine 0.6 MG tablet Take 0.5 tablets (0.3 mg total) by mouth 2 (two) times daily. 30 tablet 3 09/13/2022   Docusate Sodium (DSS) 100 MG CAPS Take 100 mg by mouth daily.   09/13/2022   escitalopram (LEXAPRO) 20 MG tablet Take 20 mg by mouth at bedtime.   09/12/2022   Febuxostat 80 MG TABS Take 80 mg by mouth daily.   09/13/2022   furosemide (LASIX) 40 MG tablet Take 1 tablet (40 mg total) by mouth daily. (Patient taking differently: Take 40 mg by mouth 2 (two) times daily.) 30 tablet 0 09/13/2022   hydrALAZINE (APRESOLINE) 50 MG tablet Take 1 tablet (50 mg total) by mouth 3 (three) times daily. 90 tablet 0 09/13/2022   pantoprazole (PROTONIX) 40 MG tablet Take 40 mg by mouth daily.    09/13/2022   potassium chloride (KLOR-CON M) 10 MEQ tablet Take 10 mEq by mouth daily.  09/13/2022   primidone (MYSOLINE) 50 MG tablet Take 100 mg by mouth in the morning and at bedtime.    09/13/2022   traZODone (DESYREL) 50 MG tablet Take 25 mg by mouth at bedtime as needed for sleep.   unknown   Vitamin D, Ergocalciferol, (DRISDOL) 1.25 MG (50000 UNIT) CAPS capsule Take 50,000 Units by mouth every 7 (seven) days. Sunday   Past Week   [DISCONTINUED] carvedilol (COREG) 25 MG tablet Take 25 mg by mouth in the morning and at bedtime.    09/13/2022 at unknown   Scheduled:   amLODipine  10 mg Oral Daily   atorvastatin  80 mg Oral Daily   calcitRIOL  0.25 mcg Oral Daily   carvedilol  12.5 mg Oral BID WC   clopidogrel  75 mg Oral Daily   docusate sodium  100 mg Oral Daily   escitalopram  20 mg Oral QHS   pantoprazole  40 mg Oral Daily   primidone  100 mg Oral BID   Warfarin - Pharmacist Dosing Inpatient   Does not apply q1600     Assessment: 76 yo male with CVA and was on apixaban PTA for afib. Plans are to change anticoagulation due to apixaban failure. He is noted on primidone which can decrease apixaban levels. The same effect is expected for rivaroxaban. Recommendations on use of dabigatran and edoxaban with primidone vary across sources (caution or avoidance).  Best option to avoid recurrent CVA is likely warfarin. On clopidogrel. Pharmacy consulted to dose warfarin.   Warfarin started on 10/3. Received 2.5 mg x1 on 10/7. INR today of 2.4 therapeutic. Increased from INR yesterday of 2.3. PLT 173, Hgb 11.6 stable. No s/sx bleeding noted in chart. Patient discharge pending SNF placement.   Goal of Therapy:  INR 2-3 Monitor platelets by anticoagulation protocol: Yes   Plan:  Give warfarin 2.5 mg x1  Obtain daily INR while admitted     Gena Fray, PharmD PGY1 Pharmacy Resident   09/26/2022 10:16 AM  **Pharmacist phone directory can now be found on [email or web address] ([email or web address]).  Listed under [place].

## 2022-09-26 NOTE — Progress Notes (Signed)
PROGRESS NOTE    Alexander Austin  ZJI:[id number] DOB: 09-Feb-1946 DOA: 09/13/2022 PCP: Windy Fast, MD     Brief Narrative:  Alexander Austin is a 76 year old male with medical history of chronic systolic heart failure, CAD s/p CABG x3 with ICD and pacemaker, paroxysmal atrial fibrillation on Eliquis, CVA, pulmonary hypertension, CKD stage IV with history of renal cell cancer s/p nephrectomy presented with right-sided weakness.  Patient takes Plavix and Eliquis at home. In the ED he was hypertensive blood pressure 160/64. Code stroke was called, CT head showed small cortical/subcortical infarct in the left frontal lobe that was new from 2015.  CTA head and neck showed no large vessel occlusion but had severe stenosis of the origin of left ICA greater than 80%. Patient was not a candidate for TNKase, since he is on Eliquis.  Neurology was consulted. vascular surgery consulted, patient underwent left carotid endarterectomy.  Patient worked with physical therapy who recommended home health.  He had episodes of nocturnal desaturations, his home Lasix was resumed.  He stated that he had had a sleep study in the past, has a CPAP machine at home that he does not use.  He was supposed to discharge home 10/5 but was delayed due to home oxygen order.  Further PT evaluation change recommendation from home health to SNF placement.  New events last 24 hours / Subjective: No new complaints today.  Creatinine trending upward.  Patient urinating using external catheter.   Assessment & Plan:   Principal Problem:   CVA (cerebral vascular accident) ([place]) Active Problems:   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 (HCC)   PAF (paroxysmal atrial fibrillation) ([place])   Essential hypertension, benign   Chronic kidney disease, stage 3a ([place])   Obesity (BMI 30.0-34.9)   Internal carotid artery stenosis, left   Skin wound from surgical incision   AICD  (automatic cardioverter/defibrillator) present   Preoperative cardiovascular examination  CVA -CT head showed small cortical/subcortical infarct within the mid to posterior left frontal lobe, left MCA/ACA watershed territory, new from prior head CT of 09/07/2014 -CTA head and neck showed severe stenosis of the origin of left ICA greater than 80%; carotid duplex obtained which shows 60 to 79% left ICA stenosis.   -MRI brain showed small acute to early subacute left frontoparietal infarct, chronic ischemia with multiple chronic infarcts -Vascular surgery consulted, patient underwent left carotid endarterectomy 10/2 -Neuro surgery signed off -Eliquis had interaction with primidone so Eliquis was not restarted. Edoxaban will require preauthorization from [place]. Thus started on coumadin  -Lipitor    OSA -Patient desaturates to 70 to 80% overnight.  Patient states that he has had a sleep study and has a CPAP at home, which he does not wear   Acute on chronic diastolic CHF -Echocardiogram from 09/14/2022 showed EF of 50 to 17%, grade 2 diastolic dysfunction -Holding Lasix in setting of AKI   AKI on chronic kidney disease stage IIIa -Baseline creatinine around 1.4 -Trended upward in setting of Lasix use -Gentle IV fluids today and repeat lab work in the morning    Hypertension -Continue amlodipine, Coreg    Paroxysmal atrial fibrillation -Found out that patient was on Eliquis and primidone and primidone interacts with Eliquis and makes it less effective; which may explain that patient had stroke while being on Eliquis. Eliquis has been discontinued -Warfarin and Plavix have been restarted   CAD s/p CABG -Continue Plavix  DVT prophylaxis:  SCD's Start: 09/20/22 1507 warfarin (COUMADIN) tablet 2.5 mg  Code Status: Partial code, DNI Family Communication: No family at bedside Disposition Plan:  Status is: Inpatient Remains inpatient appropriate because: SNF placement  pending   Antimicrobials:  Anti-infectives (From admission, onward)    Start     Dose/Rate Route Frequency Ordered Stop   09/20/22 1400  ceFAZolin (ANCEF) IVPB 2g/100 mL premix        2 g 200 mL/hr over 30 Minutes Intravenous Every 8 hours 09/20/22 1346 09/20/22 2301   09/20/22 0600  ceFAZolin (ANCEF) IVPB 2g/100 mL premix  Status:  Discontinued       Note to Pharmacy: Send with pt to OR   2 g 200 mL/hr over 30 Minutes Intravenous On call 09/19/22 0726 09/20/22 1506        Objective: Vitals:   09/25/22 2008 09/26/22 0021 09/26/22 0356 09/26/22 0724  BP: (!) 177/64 (!) 165/74 (!) 181/66 (!) 155/77  Pulse: 64 70 64 66  Resp: '20 20 20 18  '$ Temp: 98.3 F (36.8 C) 99 F (37.2 C) 98.3 F (36.8 C) 98.2 F (36.8 C)  TempSrc: Oral Oral Oral Oral  SpO2: 98% 94% 95% 94%  Weight:      Height:        Intake/Output Summary (Last 24 hours) at 09/26/2022 1023 Last data filed at 09/25/2022 2025 Gross per 24 hour  Intake 100 ml  Output 200 ml  Net -100 ml    Filed Weights   09/13/22 1820  Weight: 112.4 kg    Examination:  General exam: Appears calm and comfortable  Respiratory system: Clear to auscultation. Respiratory effort normal. No respiratory distress. No conversational dyspnea. On room air  Cardiovascular system: S1 & S2 heard, RRR. No murmurs. No pedal edema. Gastrointestinal system: Abdomen is nondistended, soft and nontender. Normal bowel sounds heard. Central nervous system: Alert and oriented. No focal neurological deficits. Speech clear.  Extremities: Symmetric in appearance  Skin: No rashes, lesions or ulcers on exposed skin  Psychiatry: Judgement and insight appear normal. Mood & affect appropriate.   Data Reviewed: I have personally reviewed following labs and imaging studies  CBC: Recent Labs  Lab 09/20/22 0248 09/21/22 0500 09/26/22 0122  WBC 3.7* 6.1 6.7  HGB 10.8* 10.3* 11.6*  HCT 31.0* 30.8* 33.0*  MCV 89.9 93.3 88.0  PLT 142* 142* 173     Basic Metabolic Panel: Recent Labs  Lab 09/22/22 0125 09/23/22 0127 09/24/22 0112 09/25/22 0113 09/26/22 0122  NA 138 140 141 141 135  K 4.0 3.8 3.6 3.9 3.6  CL 110 109 107 106 104  CO2 21* '24 25 27 24  '$ GLUCOSE 101* 90 81 98 106*  BUN 29* 33* 32* 27* 32*  CREATININE 1.67* 1.54* 1.55* 1.78* 2.01*  CALCIUM 8.4* 8.6* 9.2 9.0 8.6*    GFR: Estimated Creatinine Clearance: 40.4 mL/min (A) (by C-G formula based on SCr of 2.01 mg/dL (H)). Liver Function Tests: No results for input(s): "AST", "ALT", "ALKPHOS", "BILITOT", "PROT", "ALBUMIN" in the last 168 hours. No results for input(s): "LIPASE", "AMYLASE" in the last 168 hours. No results for input(s): "AMMONIA" in the last 168 hours. Coagulation Profile: Recent Labs  Lab 09/22/22 0125 09/23/22 0127 09/24/22 0112 09/25/22 0113 09/26/22 0122  INR 1.2 1.7* 2.1* 2.3* 2.4*    Cardiac Enzymes: No results for input(s): "CKTOTAL", "CKMB", "CKMBINDEX", "TROPONINI" in the last 168 hours. BNP (last 3 results) No results for input(s): "PROBNP" in the last 8760 hours. HbA1C:  No results for input(s): "HGBA1C" in the last 72 hours. CBG: No results for input(s): "GLUCAP" in the last 168 hours. Lipid Profile: No results for input(s): "CHOL", "HDL", "LDLCALC", "TRIG", "CHOLHDL", "LDLDIRECT" in the last 72 hours. Thyroid Function Tests: No results for input(s): "TSH", "T4TOTAL", "FREET4", "T3FREE", "THYROIDAB" in the last 72 hours. Anemia Panel: No results for input(s): "VITAMINB12", "FOLATE", "FERRITIN", "TIBC", "IRON", "RETICCTPCT" in the last 72 hours. Sepsis Labs: No results for input(s): "PROCALCITON", "LATICACIDVEN" in the last 168 hours.  Recent Results (from the past 240 hour(s))  Surgical pcr screen     Status: None   Collection Time: 09/20/22  4:42 AM   Specimen: Nasal Mucosa; Nasal Swab  Result Value Ref Range Status   MRSA, PCR NEGATIVE NEGATIVE Final   Staphylococcus aureus NEGATIVE NEGATIVE Final    Comment:  (NOTE) The Xpert SA Assay (FDA approved for NASAL specimens in patients 25 years of age and older), is one component of a comprehensive surveillance program. It is not intended to diagnose infection nor to guide or monitor treatment. Performed at [facility], [place] [address], [place], [place] [address]       Radiology Studies: No results found.    Scheduled Meds:  amLODipine  10 mg Oral Daily   atorvastatin  80 mg Oral Daily   calcitRIOL  0.25 mcg Oral Daily   carvedilol  12.5 mg Oral BID WC   clopidogrel  75 mg Oral Daily   docusate sodium  100 mg Oral Daily   escitalopram  20 mg Oral QHS   pantoprazole  40 mg Oral Daily   primidone  100 mg Oral BID   warfarin  2.5 mg Oral ONCE-1600   Warfarin - Pharmacist Dosing Inpatient   Does not apply q1600   Continuous Infusions:  sodium chloride 50 mL/hr at 09/26/22 0731   magnesium sulfate bolus IVPB       LOS: 12 days     Dessa Phi, DO [facility] 09/26/2022, 10:23 AM   Available via Epic secure chat 7am-7pm After these hours, please refer to coverage provider listed on [email or web address]

## 2022-09-26 NOTE — Progress Notes (Signed)
Mobility Specialist - Progress Note   09/26/22 1208  Mobility  Activity Ambulated with assistance in hallway  Activity Response Tolerated well  Distance Ambulated (ft) 200 ft  $Mobility charge 1 Mobility  Level of Assistance Minimal assist, patient does 75% or more  Assistive Device Front wheel walker  Mobility Referral Yes    During mobility:77 HR Post-mobility:115 HR  Pt received in bed and agreeable. No complaints throughout. Pt left in chair with all needs met. Larey Seat

## 2022-09-27 ENCOUNTER — Inpatient Hospital Stay (HOSPITAL_COMMUNITY): Payer: No Typology Code available for payment source

## 2022-09-27 DIAGNOSIS — I639 Cerebral infarction, unspecified: Secondary | ICD-10-CM | POA: Diagnosis not present

## 2022-09-27 LAB — CBC
HCT: 32.5 % — ABNORMAL LOW (ref 39.0–52.0)
Hemoglobin: 11.4 g/dL — ABNORMAL LOW (ref 13.0–17.0)
MCH: 31 pg (ref 26.0–34.0)
MCHC: 35.1 g/dL (ref 30.0–36.0)
MCV: 88.3 fL (ref 80.0–100.0)
Platelets: 179 10*3/uL (ref 150–400)
RBC: 3.68 MIL/uL — ABNORMAL LOW (ref 4.22–5.81)
RDW: 15.1 % (ref 11.5–15.5)
WBC: 6.4 10*3/uL (ref 4.0–10.5)
nRBC: 0 % (ref 0.0–0.2)

## 2022-09-27 LAB — BASIC METABOLIC PANEL
Anion gap: 11 (ref 5–15)
BUN: 39 mg/dL — ABNORMAL HIGH (ref 8–23)
CO2: 23 mmol/L (ref 22–32)
Calcium: 9 mg/dL (ref 8.9–10.3)
Chloride: 104 mmol/L (ref 98–111)
Creatinine, Ser: 2.1 mg/dL — ABNORMAL HIGH (ref 0.61–1.24)
GFR, Estimated: 32 mL/min — ABNORMAL LOW (ref >60)
Glucose, Bld: 115 mg/dL — ABNORMAL HIGH (ref 70–99)
Potassium: 3.8 mmol/L (ref 3.5–5.1)
Sodium: 138 mmol/L (ref 135–145)

## 2022-09-27 LAB — PROTIME-INR
INR: 2.4 — ABNORMAL HIGH (ref 0.8–1.2)
Prothrombin Time: 26.1 seconds — ABNORMAL HIGH (ref 11.4–15.2)

## 2022-09-27 MED ORDER — WARFARIN SODIUM 2.5 MG PO TABS
2.5000 mg | ORAL_TABLET | Freq: Every day | ORAL | Status: DC
  Administered 2022-09-27 – 2022-09-29 (×3): 2.5 mg via ORAL
  Filled 2022-09-27 (×3): qty 1

## 2022-09-27 MED ORDER — SODIUM CHLORIDE 0.9 % IV SOLN: INTRAVENOUS | Status: AC

## 2022-09-27 NOTE — Progress Notes (Signed)
PROGRESS NOTE    Alexander Austin  UUV:[id number] DOB: 25-Feb-1946 DOA: 09/13/2022 PCP: Windy Fast, MD     Brief Narrative:  Alexander Austin is a 76 year old male with medical history of chronic systolic heart failure, CAD s/p CABG x3 with ICD and pacemaker, paroxysmal atrial fibrillation on Eliquis, CVA, pulmonary hypertension, CKD stage IV with history of renal cell cancer s/p nephrectomy presented with right-sided weakness.  Patient takes Plavix and Eliquis at home. In the ED he was hypertensive blood pressure 160/64. Code stroke was called, CT head showed small cortical/subcortical infarct in the left frontal lobe that was new from 2015.  CTA head and neck showed no large vessel occlusion but had severe stenosis of the origin of left ICA greater than 80%. Patient was not a candidate for TNKase, since he is on Eliquis.  Neurology was consulted. vascular surgery consulted, patient underwent left carotid endarterectomy.  Patient worked with physical therapy who recommended home health.  He had episodes of nocturnal desaturations, his home Lasix was resumed.  He stated that he had had a sleep study in the past, has a CPAP machine at home that he does not use.  He was supposed to discharge home 10/5 but was delayed due to home oxygen order.  Further PT evaluation change recommendation from home health to SNF placement.  New events last 24 hours / Subjective: Tired this morning. Denies SOB.    Assessment & Plan:   Principal Problem:   Acute CVA (cerebrovascular accident) [facility]) Active Problems:   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 (HCC)   PAF (paroxysmal atrial fibrillation) (HCC)   Essential hypertension, benign   Chronic kidney disease, stage 3a ([place])   Obesity (BMI 30.0-34.9)   Internal carotid artery stenosis, left   Skin wound from surgical incision   AICD (automatic cardioverter/defibrillator) present   Preoperative  cardiovascular examination  CVA -CT head showed small cortical/subcortical infarct within the mid to posterior left frontal lobe, left MCA/ACA watershed territory, new from prior head CT of 09/07/2014 -CTA head and neck showed severe stenosis of the origin of left ICA greater than 80%; carotid duplex obtained which shows 60 to 79% left ICA stenosis.   -MRI brain showed small acute to early subacute left frontoparietal infarct, chronic ischemia with multiple chronic infarcts -Vascular surgery consulted, patient underwent left carotid endarterectomy 10/2 -Neuro surgery signed off -Eliquis had interaction with primidone so Eliquis was not restarted. Edoxaban will require preauthorization from [place]. Thus started on coumadin  -Lipitor    OSA -Patient desaturates to 70 to 80% overnight.  Patient states that he has had a sleep study and has a CPAP at home, which he does not wear   Acute on chronic diastolic CHF -Echocardiogram from 09/14/2022 showed EF of 50 to 47%, grade 2 diastolic dysfunction -Holding Lasix in setting of AKI   AKI on chronic kidney disease stage IIIa -Baseline creatinine around 1.4 -Trended upward in setting of Lasix use -Renal ultrasound without hydronephrosis -Continue gentle IV fluids today and repeat lab work in the morning    Hypertension -Continue amlodipine, Coreg    Paroxysmal atrial fibrillation -Found out that patient was on Eliquis and primidone and primidone interacts with Eliquis and makes it less effective; which may explain that patient had stroke while being on Eliquis. Eliquis has been discontinued -Warfarin and Plavix have been restarted   CAD s/p CABG -Continue Plavix     DVT prophylaxis:  SCD's Start: 09/20/22 1507 warfarin (COUMADIN) tablet 2.5 mg  Code Status: Partial code, DNI Family Communication: No family at bedside Disposition Plan:  Status is: Inpatient Remains inpatient appropriate because: SNF placement  pending   Antimicrobials:  Anti-infectives (From admission, onward)    Start     Dose/Rate Route Frequency Ordered Stop   09/20/22 1400  ceFAZolin (ANCEF) IVPB 2g/100 mL premix        2 g 200 mL/hr over 30 Minutes Intravenous Every 8 hours 09/20/22 1346 09/20/22 2301   09/20/22 0600  ceFAZolin (ANCEF) IVPB 2g/100 mL premix  Status:  Discontinued       Note to Pharmacy: Send with pt to OR   2 g 200 mL/hr over 30 Minutes Intravenous On call 09/19/22 0726 09/20/22 1506        Objective: Vitals:   09/26/22 2314 09/27/22 0339 09/27/22 0758 09/27/22 1139  BP: 134/66 (!) 148/57 (!) 155/62 (!) 109/96  Pulse: 63 (!) 59 (!) 57 (!) 58  Resp: '20 20 18 16  '$ Temp: 97.6 F (36.4 C) (!) 97.3 F (36.3 C) (!) 97.2 F (36.2 C) 97.6 F (36.4 C)  TempSrc: Oral Oral Oral Oral  SpO2: 98% 96% 93% 100%  Weight:      Height:        Intake/Output Summary (Last 24 hours) at 09/27/2022 1149 Last data filed at 09/27/2022 1136 Gross per 24 hour  Intake --  Output 775 ml  Net -775 ml    Filed Weights   09/13/22 1820  Weight: 112.4 kg    Examination:  General exam: Appears calm and comfortable  Respiratory system: Clear to auscultation. Respiratory effort normal. No respiratory distress. No conversational dyspnea.  Cardiovascular system: S1 & S2 heard, RRR. No murmurs. No pedal edema. Gastrointestinal system: Abdomen is nondistended, soft and nontender. Normal bowel sounds heard. Central nervous system: Alert and oriented. No focal neurological deficits. Speech clear.  Extremities: Symmetric in appearance  Skin: No rashes, lesions or ulcers on exposed skin  Psychiatry: Judgement and insight appear normal. Mood & affect appropriate.   Data Reviewed: I have personally reviewed following labs and imaging studies  CBC: Recent Labs  Lab 09/21/22 0500 09/26/22 0122 09/27/22 0018  WBC 6.1 6.7 6.4  HGB 10.3* 11.6* 11.4*  HCT 30.8* 33.0* 32.5*  MCV 93.3 88.0 88.3  PLT 142* 173 179     Basic Metabolic Panel: Recent Labs  Lab 09/23/22 0127 09/24/22 0112 09/25/22 0113 09/26/22 0122 09/27/22 0018  NA 140 141 141 135 138  K 3.8 3.6 3.9 3.6 3.8  CL 109 107 106 104 104  CO2 '24 25 27 24 23  '$ GLUCOSE 90 81 98 106* 115*  BUN 33* 32* 27* 32* 39*  CREATININE 1.54* 1.55* 1.78* 2.01* 2.10*  CALCIUM 8.6* 9.2 9.0 8.6* 9.0    GFR: Estimated Creatinine Clearance: 38.7 mL/min (A) (by C-G formula based on SCr of 2.1 mg/dL (H)). Liver Function Tests: No results for input(s): "AST", "ALT", "ALKPHOS", "BILITOT", "PROT", "ALBUMIN" in the last 168 hours. No results for input(s): "LIPASE", "AMYLASE" in the last 168 hours. No results for input(s): "AMMONIA" in the last 168 hours. Coagulation Profile: Recent Labs  Lab 09/23/22 0127 09/24/22 0112 09/25/22 0113 09/26/22 0122 09/27/22 0018  INR 1.7* 2.1* 2.3* 2.4* 2.4*    Cardiac Enzymes: No results for input(s): "CKTOTAL", "CKMB", "CKMBINDEX", "TROPONINI" in the last 168 hours. BNP (last 3 results) No results for input(s): "PROBNP" in the last 8760 hours. HbA1C: No results for  input(s): "HGBA1C" in the last 72 hours. CBG: No results for input(s): "GLUCAP" in the last 168 hours. Lipid Profile: No results for input(s): "CHOL", "HDL", "LDLCALC", "TRIG", "CHOLHDL", "LDLDIRECT" in the last 72 hours. Thyroid Function Tests: No results for input(s): "TSH", "T4TOTAL", "FREET4", "T3FREE", "THYROIDAB" in the last 72 hours. Anemia Panel: No results for input(s): "VITAMINB12", "FOLATE", "FERRITIN", "TIBC", "IRON", "RETICCTPCT" in the last 72 hours. Sepsis Labs: No results for input(s): "PROCALCITON", "LATICACIDVEN" in the last 168 hours.  Recent Results (from the past 240 hour(s))  Surgical pcr screen     Status: None   Collection Time: 09/20/22  4:42 AM   Specimen: Nasal Mucosa; Nasal Swab  Result Value Ref Range Status   MRSA, PCR NEGATIVE NEGATIVE Final   Staphylococcus aureus NEGATIVE NEGATIVE Final    Comment:  (NOTE) The Xpert SA Assay (FDA approved for NASAL specimens in patients 48 years of age and older), is one component of a comprehensive surveillance program. It is not intended to diagnose infection nor to guide or monitor treatment. Performed at [facility], [place] [address] [place], [place] [address]       Radiology Studies: US RENAL  Result Date: 09/27/2022 CLINICAL DATA:  Renal dysfunction EXAM: RENAL / URINARY TRACT ULTRASOUND COMPLETE COMPARISON:  Previous CT done on 01/30/2020 FINDINGS: Right Kidney: Renal measurements: 8.6 x 4.7 x 4.8 cm = volume: 102.4 mL. There is no hydronephrosis. There is cortical thinning. Right kidney is smaller than left, possibly due to previous partial removal. Left Kidney: Renal measurements: 10.9 x 6 x 5.6 cm = volume: 191.9 mL. There is no hydronephrosis. There is cortical thinning. Bladder: Appears normal for degree of bladder distention. Other: None. IMPRESSION: There is no hydronephrosis. Electronically Signed   By: Elmer Picker M.D.   On: 09/27/2022 09:23      Scheduled Meds:  amLODipine  10 mg Oral Daily   atorvastatin  80 mg Oral Daily   calcitRIOL  0.25 mcg Oral Daily   carvedilol  12.5 mg Oral BID WC   clopidogrel  75 mg Oral Daily   docusate sodium  100 mg Oral Daily   escitalopram  20 mg Oral QHS   pantoprazole  40 mg Oral Daily   primidone  100 mg Oral BID   warfarin  2.5 mg Oral q1600   Warfarin - Pharmacist Dosing Inpatient   Does not apply q1600   Continuous Infusions:  sodium chloride 50 mL/hr at 09/27/22 0807   magnesium sulfate bolus IVPB       LOS: 13 days     Dessa Phi, DO [facility] 09/27/2022, 11:49 AM   Available via Epic secure chat 7am-7pm After these hours, please refer to coverage provider listed on [email or web address]

## 2022-09-27 NOTE — Progress Notes (Signed)
[place] for warfarin Indication: stroke  Allergies  Allergen Reactions   Isosorbide Nitrate Anaphylaxis    Other reaction(s): Cardiovascular Arrest (ALLERGY/intolerance) Can take Sublingual Nitro.    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   Medical History: Past Medical History:  Diagnosis Date   A-fib [facility])    Anxiety    Benign prostate hyperplasia    Cancer (HCC)    Kidney   Chronic kidney disease    Congestive heart failure (CHF) (HCC)    CVA (cerebral infarction)    Depression    Dyspnea    GERD (gastroesophageal reflux disease)    Gout    Hematospermia 01/13/2012   History of myocardial infarction 1993   Hyperlipidemia    Hypertension    Sleep apnea    Stroke [facility])    Vitamin B 12 deficiency    Weakness of left leg 03/28/2020   Assessment: 76 yo male with CVA and was on apixaban PTA for afib. Plans are to change anticoagulation due to apixaban failure. He is noted on primidone which can decrease apixaban levels. The same effect is expected for rivaroxaban. Recommendations on use of dabigatran and edoxaban with primidone vary across sources (caution or avoidance).  Best option to avoid recurrent CVA is likely warfarin. On clopidogrel. Pharmacy consulted to dose warfarin.   Warfarin started on 10/3. INR today  continues to be at goal at 2.4. CBC stable. No s/sx bleeding noted in chart. Patient discharge pending SNF placement.   Goal of Therapy:  INR 2-3 Monitor platelets by anticoagulation protocol: Yes   Plan:  Plan to continue 2.5 mg daily for now Obtain daily INR for another couple of days to ensure stable  Erin Hearing PharmD., BCPS Clinical Pharmacist 09/27/2022 8:48 AM

## 2022-09-27 NOTE — Progress Notes (Signed)
Mobility Specialist Progress Note:   09/27/22 1616  Mobility  Activity Ambulated with assistance in hallway  Level of Assistance Modified independent, requires aide device or extra time  Assistive Device Four wheel walker  Distance Ambulated (ft) 200 ft  Activity Response Tolerated well  $Mobility charge 1 Mobility   Pt received in chair willing to participate in mobility. No complaints of pain. Left in chair with call bell in reach and all needs met.   [facility] Surveyor, mining Chat only

## 2022-09-27 NOTE — Progress Notes (Addendum)
PT Cancellation Note  Patient Details Name: Alexander Austin MRN: [id number] DOB: Feb 11, 1946   Cancelled Treatment:    Reason Eval/Treat Not Completed: Patient at procedure or test/unavailable. Pt off floor for renal ultrasound.  1047 Addendum: Checked on pt upon return from procedure. Pt declining due to fatigue. PT to re-attempt as time allows.   Lorriane Shire 09/27/2022, 9:15 AM

## 2022-09-28 DIAGNOSIS — I639 Cerebral infarction, unspecified: Secondary | ICD-10-CM | POA: Diagnosis not present

## 2022-09-28 LAB — BASIC METABOLIC PANEL
Anion gap: 10 (ref 5–15)
BUN: 32 mg/dL — ABNORMAL HIGH (ref 8–23)
CO2: 22 mmol/L (ref 22–32)
Calcium: 8.6 mg/dL — ABNORMAL LOW (ref 8.9–10.3)
Chloride: 107 mmol/L (ref 98–111)
Creatinine, Ser: 1.71 mg/dL — ABNORMAL HIGH (ref 0.61–1.24)
GFR, Estimated: 41 mL/min — ABNORMAL LOW (ref >60)
Glucose, Bld: 111 mg/dL — ABNORMAL HIGH (ref 70–99)
Potassium: 3.8 mmol/L (ref 3.5–5.1)
Sodium: 139 mmol/L (ref 135–145)

## 2022-09-28 LAB — PROTIME-INR
INR: 2.7 — ABNORMAL HIGH (ref 0.8–1.2)
Prothrombin Time: 28 seconds — ABNORMAL HIGH (ref 11.4–15.2)

## 2022-09-28 MED ORDER — HYDRALAZINE HCL 20 MG/ML IJ SOLN
10.0000 mg | INTRAMUSCULAR | Status: DC | PRN
  Administered 2022-09-29: 10 mg via INTRAVENOUS
  Filled 2022-09-28: qty 1

## 2022-09-28 NOTE — TOC Progression Note (Signed)
Transition of Care [facility]) - Progression Note    Patient Details  Name: Alexander Austin MRN: [id number] Date of Birth: [birth year]  Transition of Care [facility]) CM/SW [place], [place] Phone Number: 09/28/2022, 3:04 PM  Clinical Narrative:     CSW spoke with Freda Munro- reviewed  [place] vs Medicare benefits. Family preference is for patient to use his Medicare benefits.  The preferred SNF is [facility].   CSW contacted [place] for SNF to confirmed bed offer.  TOC will continue to follow and assist with discharge planning.  Thurmond Butts, MSW, LCSW Clinical Social Worker    Expected Discharge Plan: SNF Barriers to Discharge: Insurance Authorization, SNF Pending bed offer  Expected Discharge Plan and Services Expected Discharge Plan: [place]   Discharge Planning Services: CM Consult Post Acute Care Choice: [place] arrangements for the past 2 months: [place] Expected Discharge Date: 09/24/22               DME Arranged: Oxygen DME Agency: [place] Date DME Agency Contacted: 09/23/22 Time DME Agency Contacted: 95 Representative spoke with at DME Agency: [place]: RN, PT, Social Work [employer] Agency: [place] Date [place]: 09/15/22   Representative spoke with at [place]: [place] ([place]) Interventions    Readmission Risk Interventions    07/04/2020   11:18 AM 04/03/2020   12:10 PM  Readmission Risk Prevention Plan  Transportation Screening Complete Complete  PCP or Specialist Appt within 3-5 Days  Complete  HRI or [place]  Complete  Social Work Consult for [place] Planning/Counseling  [place] Screening  Not Applicable  Medication Review Press photographer) Referral to Pharmacy Complete  PCP or Specialist appointment within 3-5 days of discharge Complete   HRI or [place] Complete    SW Recovery Care/Counseling Consult Complete   [place] Patient Refused

## 2022-09-28 NOTE — Progress Notes (Signed)
Alexander Austin for warfarin Indication: stroke  Allergies  Allergen Reactions   Isosorbide Nitrate Anaphylaxis    Other reaction(s): Cardiovascular Arrest (ALLERGY/intolerance) Can take Sublingual Nitro.    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   Assessment: 76 yo male with CVA and was on apixaban PTA for afib. Plans are to change anticoagulation due to apixaban failure. He is noted on primidone which can decrease apixaban levels. The same effect is expected for rivaroxaban. Recommendations on use of dabigatran and edoxaban with primidone vary across sources (caution or avoidance).  Best option to avoid recurrent CVA is likely warfarin. On clopidogrel. Pharmacy consulted to dose warfarin.   Warfarin started on 10/3. INR remains therapeutic; no bleeding reported.  Goal of Therapy:  INR 2-3 Monitor platelets by anticoagulation protocol: Yes   Plan:  Continue Coumadin 2.'5mg'$  PO daily for now Obtain daily INR for another couple of days to ensure stable  Almas Rake D. Mina Marble, PharmD, BCPS, [place] 09/28/2022, 9:14 AM

## 2022-09-28 NOTE — Progress Notes (Signed)
PROGRESS NOTE    Alexander Austin  GTX:[id number] DOB: [birth year] DOA: 09/13/2022 PCP: Windy Fast, MD     Brief Narrative:  Alexander Austin is a 76 year old male with medical history of chronic systolic heart failure, CAD s/p CABG x3 with ICD and pacemaker, paroxysmal atrial fibrillation on Eliquis, CVA, pulmonary hypertension, CKD stage IV with history of renal cell cancer s/p nephrectomy presented with right-sided weakness.  Patient takes Plavix and Eliquis at home. In the ED he was hypertensive blood pressure 160/64. Code stroke was called, CT head showed small cortical/subcortical infarct in the left frontal lobe that was new from 2015.  CTA head and neck showed no large vessel occlusion but had severe stenosis of the origin of left ICA greater than 80%. Patient was not a candidate for TNKase, since he is on Eliquis.  Neurology was consulted. vascular surgery consulted, patient underwent left carotid endarterectomy.  Patient worked with physical therapy who recommended home health.  He had episodes of nocturnal desaturations, his home Lasix was resumed.  He stated that he had had a sleep study in the past, has a CPAP machine at home that he does not use.  He was supposed to discharge home 10/5 but was delayed due to home oxygen order.  Further PT evaluation change recommendation from home health to SNF placement.  He also required 2 days of IV fluids due to mild AKI.  Currently remains medically stable for discharge to skilled nursing facility once placement is found.  New events last 24 hours / Subjective: He has no new complaints this morning.  Remains stable on room air.   Assessment & Plan:   Principal Problem:   Acute CVA (cerebrovascular accident) [facility]) Active Problems:   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 (HCC)   PAF (paroxysmal atrial fibrillation) (HCC)   Essential hypertension, benign   Chronic kidney disease,  stage 3a ([place])   Obesity (BMI 30.0-34.9)   Internal carotid artery stenosis, left   Skin wound from surgical incision   AICD (automatic cardioverter/defibrillator) present   Preoperative cardiovascular examination  CVA -CT head showed small cortical/subcortical infarct within the mid to posterior left frontal lobe, left MCA/ACA watershed territory, new from prior head CT of 09/07/2014 -CTA head and neck showed severe stenosis of the origin of left ICA greater than 80%; carotid duplex obtained which shows 60 to 79% left ICA stenosis.   -MRI brain showed small acute to early subacute left frontoparietal infarct, chronic ischemia with multiple chronic infarcts -Vascular surgery consulted, patient underwent left carotid endarterectomy 10/2 -Neuro surgery signed off -Eliquis had interaction with primidone so Eliquis was not restarted. Edoxaban will require preauthorization from [place]. Thus started on coumadin  -Coumadin, Lipitor    OSA -Patient desaturates to 70 to 80% overnight.  Patient states that he has had a sleep study and has a CPAP at home, which he does not wear   Acute on chronic diastolic CHF -Echocardiogram from 09/14/2022 showed EF of 50 to 24%, grade 2 diastolic dysfunction -Holding Lasix in setting of AKI   AKI on chronic kidney disease stage IIIa -Baseline creatinine around 1.4 -Trended upward in setting of Lasix use -Renal ultrasound without hydronephrosis -Improved with gentle IV fluid   Hypertension -Continue amlodipine, Coreg    Paroxysmal atrial fibrillation -Found out that patient was on Eliquis and primidone and primidone interacts with Eliquis and makes it less effective; which may explain that patient had  stroke while being on Eliquis. Eliquis has been discontinued -Warfarin and Plavix have been restarted   CAD s/p CABG -Continue Plavix     DVT prophylaxis:  SCD's Start: 09/20/22 1507 warfarin (COUMADIN) tablet 2.5 mg  Code Status: Partial code,  DNI Family Communication: No family at bedside Disposition Plan:  Status is: Inpatient Remains inpatient appropriate because: SNF placement pending   Antimicrobials:  Anti-infectives (From admission, onward)    Start     Dose/Rate Route Frequency Ordered Stop   09/20/22 1400  ceFAZolin (ANCEF) IVPB 2g/100 mL premix        2 g 200 mL/hr over 30 Minutes Intravenous Every 8 hours 09/20/22 1346 09/20/22 2301   09/20/22 0600  ceFAZolin (ANCEF) IVPB 2g/100 mL premix  Status:  Discontinued       Note to Pharmacy: Send with pt to OR   2 g 200 mL/hr over 30 Minutes Intravenous On call 09/19/22 0726 09/20/22 1506        Objective: Vitals:   09/27/22 1935 09/27/22 2342 09/28/22 0443 09/28/22 0940  BP: 137/60 (!) 159/81 (!) 165/65 (!) 173/72  Pulse: 61 62 65 63  Resp: '20 20 20 12  '$ Temp: 97.6 F (36.4 C) (!) 97.4 F (36.3 C) 98.1 F (36.7 C) 98.4 F (36.9 C)  TempSrc: Oral Axillary Oral Oral  SpO2: 94% 97% 92% 99%  Weight:      Height:        Intake/Output Summary (Last 24 hours) at 09/28/2022 1046 Last data filed at 09/27/2022 2001 Gross per 24 hour  Intake 595 ml  Output 525 ml  Net 70 ml    Filed Weights   09/13/22 1820  Weight: 112.4 kg    Examination:  General exam: Appears calm and comfortable  Respiratory system: Clear to auscultation. Respiratory effort normal. No respiratory distress. No conversational dyspnea.  Cardiovascular system: S1 & S2 heard, RRR. No murmurs. No pedal edema. Gastrointestinal system: Abdomen is nondistended, soft and nontender. Normal bowel sounds heard. Central nervous system: Alert and oriented. No focal neurological deficits. Speech clear.  Extremities: Symmetric in appearance  Skin: No rashes, lesions or ulcers on exposed skin  Psychiatry: Judgement and insight appear normal. Mood & affect appropriate.   Data Reviewed: I have personally reviewed following labs and imaging studies  CBC: Recent Labs  Lab 09/26/22 0122  09/27/22 0018  WBC 6.7 6.4  HGB 11.6* 11.4*  HCT 33.0* 32.5*  MCV 88.0 88.3  PLT 173 381    Basic Metabolic Panel: Recent Labs  Lab 09/24/22 0112 09/25/22 0113 09/26/22 0122 09/27/22 0018 09/28/22 0321  NA 141 141 135 138 139  K 3.6 3.9 3.6 3.8 3.8  CL 107 106 104 104 107  CO2 '25 27 24 23 22  '$ GLUCOSE 81 98 106* 115* 111*  BUN 32* 27* 32* 39* 32*  CREATININE 1.55* 1.78* 2.01* 2.10* 1.71*  CALCIUM 9.2 9.0 8.6* 9.0 8.6*    GFR: Estimated Creatinine Clearance: 47.5 mL/min (A) (by C-G formula based on SCr of 1.71 mg/dL (H)). Liver Function Tests: No results for input(s): "AST", "ALT", "ALKPHOS", "BILITOT", "PROT", "ALBUMIN" in the last 168 hours. No results for input(s): "LIPASE", "AMYLASE" in the last 168 hours. No results for input(s): "AMMONIA" in the last 168 hours. Coagulation Profile: Recent Labs  Lab 09/24/22 0112 09/25/22 0113 09/26/22 0122 09/27/22 0018 09/28/22 0321  INR 2.1* 2.3* 2.4* 2.4* 2.7*    Cardiac Enzymes: No results for input(s): "CKTOTAL", "CKMB", "CKMBINDEX", "TROPONINI" in the last 168  hours. BNP (last 3 results) No results for input(s): "PROBNP" in the last 8760 hours. HbA1C: No results for input(s): "HGBA1C" in the last 72 hours. CBG: No results for input(s): "GLUCAP" in the last 168 hours. Lipid Profile: No results for input(s): "CHOL", "HDL", "LDLCALC", "TRIG", "CHOLHDL", "LDLDIRECT" in the last 72 hours. Thyroid Function Tests: No results for input(s): "TSH", "T4TOTAL", "FREET4", "T3FREE", "THYROIDAB" in the last 72 hours. Anemia Panel: No results for input(s): "VITAMINB12", "FOLATE", "FERRITIN", "TIBC", "IRON", "RETICCTPCT" in the last 72 hours. Sepsis Labs: No results for input(s): "PROCALCITON", "LATICACIDVEN" in the last 168 hours.  Recent Results (from the past 240 hour(s))  Surgical pcr screen     Status: None   Collection Time: 09/20/22  4:42 AM   Specimen: Nasal Mucosa; Nasal Swab  Result Value Ref Range Status   MRSA,  PCR NEGATIVE NEGATIVE Final   Staphylococcus aureus NEGATIVE NEGATIVE Final    Comment: (NOTE) The Xpert SA Assay (FDA approved for NASAL specimens in patients 32 years of age and older), is one component of a comprehensive surveillance program. It is not intended to diagnose infection nor to guide or monitor treatment. Performed at [facility], [place] [address], [place], [place] [address]       Radiology Studies: US RENAL  Result Date: 09/27/2022 CLINICAL DATA:  Renal dysfunction EXAM: RENAL / URINARY TRACT ULTRASOUND COMPLETE COMPARISON:  Previous CT done on 01/30/2020 FINDINGS: Right Kidney: Renal measurements: 8.6 x 4.7 x 4.8 cm = volume: 102.4 mL. There is no hydronephrosis. There is cortical thinning. Right kidney is smaller than left, possibly due to previous partial removal. Left Kidney: Renal measurements: 10.9 x 6 x 5.6 cm = volume: 191.9 mL. There is no hydronephrosis. There is cortical thinning. Bladder: Appears normal for degree of bladder distention. Other: None. IMPRESSION: There is no hydronephrosis. Electronically Signed   By: Elmer Picker M.D.   On: 09/27/2022 09:23      Scheduled Meds:  amLODipine  10 mg Oral Daily   atorvastatin  80 mg Oral Daily   calcitRIOL  0.25 mcg Oral Daily   carvedilol  12.5 mg Oral BID WC   clopidogrel  75 mg Oral Daily   docusate sodium  100 mg Oral Daily   escitalopram  20 mg Oral QHS   pantoprazole  40 mg Oral Daily   primidone  100 mg Oral BID   warfarin  2.5 mg Oral q1600   Warfarin - Pharmacist Dosing Inpatient   Does not apply q1600   Continuous Infusions:  magnesium sulfate bolus IVPB       LOS: 14 days     Dessa Phi, DO [facility] 09/28/2022, 10:46 AM   Available via Epic secure chat 7am-7pm After these hours, please refer to coverage provider listed on [email or web address]

## 2022-09-28 NOTE — TOC Progression Note (Signed)
Transition of Care [facility]) - Progression Note    Patient Details  Name: Alexander Austin MRN: [id number] Date of Birth: 07-23-1946  Transition of Care [facility]) CM/SW [place], [place] Phone Number: 09/28/2022, 3:04 PM  Clinical Narrative:       Expected Discharge Plan: [place] Barriers to Discharge: Insurance Authorization, SNF Pending bed offer  Expected Discharge Plan and Services Expected Discharge Plan: [place]   Discharge Planning Services: CM Consult Post Acute Care Choice: [place] arrangements for the past 2 months: [place] Expected Discharge Date: 09/24/22               DME Arranged: Oxygen DME Agency: [place] Date DME Agency Contacted: 09/23/22 Time DME Agency Contacted: 1400 Representative spoke with at DME Agency: [place]: RN, PT, Social Work [employer] Agency: [place] Date [place]: 09/15/22   Representative spoke with at [place]: [place] ([place]) Interventions    Readmission Risk Interventions    07/04/2020   11:18 AM 04/03/2020   12:10 PM  Readmission Risk Prevention Plan  Transportation Screening Complete Complete  PCP or Specialist Appt within 3-5 Days  Complete  HRI or [place]  Complete  Social Work Consult for [place] Planning/Counseling  [place] Screening  Not Applicable  Medication Review Press photographer) Referral to Pharmacy Complete  PCP or Specialist appointment within 3-5 days of discharge Complete   HRI or [place] Complete   SW Recovery Care/Counseling Consult Complete   [place] Patient Refused

## 2022-09-28 NOTE — Progress Notes (Signed)
Physical Therapy Treatment Patient Details Name: Alexander Austin MRN: [id number] DOB: 03/16/1946 Today's Date: 09/28/2022   History of Present Illness Pt is a 77 y/o male who presented with R UE weakness and unsteady gait. MRI showed small acute to subacute lt frontoparietal infarct. Pt underwent lt carotid endarectomy on 09/20/22.  PMH: CHF, CAD s/p CABG x 3, ICD, PPM, a fib, pulmonary hypertension, CKD 4, hx of renal cell cancer s/p nephrectomy.    PT Comments    Pt received supine and agreeable to session. Pt continues to fatigue quickly needing up to min assist for transfers to standing and gait with personal rollator with pt needing cues throughout gait for upright posture and forward gaze as well as safety awareness as pt speeding up to unsafe speed intermittently. Current plan remains appropriate to address deficits and maximize functional independence and decrease caregiver burden as pt continues to be limited by fatigue, weakness and decreased safety awareness. Pt continues to benefit from skilled PT services to progress toward functional mobility goals.    Recommendations for follow up therapy are one component of a multi-disciplinary discharge planning process, led by the attending physician.  Recommendations may be updated based on patient status, additional functional criteria and insurance authorization.  Follow Up Recommendations  Skilled nursing-short term rehab (<3 hours/day) Can patient physically be transported by private vehicle: Yes   Assistance Recommended at Discharge Intermittent Supervision/Assistance  Patient can return home with the following A little help with bathing/dressing/bathroom;A little help with walking and/or transfers;Assistance with cooking/housework;Help with stairs or ramp for entrance;Assist for transportation   Equipment Recommendations  None recommended by PT    Recommendations for Other Services       Precautions / Restrictions  Precautions Precautions: Fall Restrictions Weight Bearing Restrictions: No     Mobility  Bed Mobility Overal bed mobility: Needs Assistance Bed Mobility: Supine to Sit     Supine to sit: Min guard, HOB elevated     General bed mobility comments: Incr time to perform and use of rail.    Transfers Overall transfer level: Needs assistance Equipment used: Rollator (4 wheels) Transfers: Sit to/from Stand Sit to Stand: Min assist           General transfer comment: Assist to bring hips up and for balance.    Ambulation/Gait Ambulation/Gait assistance: Min assist Gait Distance (Feet): 150 Feet Assistive device: Rollator (4 wheels) Gait Pattern/deviations: Step-through pattern, Decreased stride length, Wide base of support, Trunk flexed Gait velocity: Decreased     General Gait Details: Assist for balance. Verbal cues to stand more erect and to slow pace as pt speeding up to unsafe speed   Stairs             Wheelchair Mobility    Modified Rankin (Stroke Patients Only)       Balance Overall balance assessment: Needs assistance Sitting-balance support: Feet supported, No upper extremity supported Sitting balance-Leahy Scale: Good     Standing balance support: Bilateral upper extremity supported Standing balance-Leahy Scale: Poor Standing balance comment: rollator and min guard for static standing                            Cognition Arousal/Alertness: Awake/alert Behavior During Therapy: WFL for tasks assessed/performed Overall Cognitive Status: History of cognitive impairments - at baseline  General Comments: decreased safety awareness        Exercises      General Comments General comments (skin integrity, edema, etc.): VSS on RA      Pertinent Vitals/Pain Pain Assessment Pain Assessment: Faces Faces Pain Scale: Hurts little more Pain Location: back Pain Descriptors / Indicators:  Sore, Tightness Pain Intervention(s): Monitored during session, Limited activity within patient's tolerance, Repositioned    Home Living                          Prior Function            PT Goals (current goals can now be found in the care plan section) Acute Rehab PT Goals PT Goal Formulation: With patient Time For Goal Achievement: 09/28/22    Frequency    Min 3X/week      PT Plan      Co-evaluation              AM-PAC PT "6 Clicks" Mobility   Outcome Measure  Help needed turning from your back to your side while in a flat bed without using bedrails?: A Little Help needed moving from lying on your back to sitting on the side of a flat bed without using bedrails?: A Little Help needed moving to and from a bed to a chair (including a wheelchair)?: A Little Help needed standing up from a chair using your arms (e.g., wheelchair or bedside chair)?: A Little Help needed to walk in hospital room?: A Little Help needed climbing 3-5 steps with a railing? : A Lot 6 Click Score: 17    End of Session Equipment Utilized During Treatment: Gait belt Activity Tolerance: Patient limited by fatigue Patient left: in chair;with call bell/phone within reach;with chair alarm set Nurse Communication: Mobility status PT Visit Diagnosis: Muscle weakness (generalized) (M62.81);Other abnormalities of gait and mobility (R26.89)     Time: 4801-6553 PT Time Calculation (min) (ACUTE ONLY): 30 min  Charges:  $Gait Training: 23-37 mins                    Araseli Sherry R. PTA Acute Rehabilitation Services Office: [place] 09/28/2022, 12:28 PM

## 2022-09-29 DIAGNOSIS — I639 Cerebral infarction, unspecified: Secondary | ICD-10-CM | POA: Diagnosis not present

## 2022-09-29 LAB — BASIC METABOLIC PANEL
Anion gap: 6 (ref 5–15)
BUN: 25 mg/dL — ABNORMAL HIGH (ref 8–23)
CO2: 22 mmol/L (ref 22–32)
Calcium: 8.7 mg/dL — ABNORMAL LOW (ref 8.9–10.3)
Chloride: 111 mmol/L (ref 98–111)
Creatinine, Ser: 1.58 mg/dL — ABNORMAL HIGH (ref 0.61–1.24)
GFR, Estimated: 45 mL/min — ABNORMAL LOW (ref >60)
Glucose, Bld: 89 mg/dL (ref 70–99)
Potassium: 4 mmol/L (ref 3.5–5.1)
Sodium: 139 mmol/L (ref 135–145)

## 2022-09-29 LAB — PROTIME-INR
INR: 2.2 — ABNORMAL HIGH (ref 0.8–1.2)
Prothrombin Time: 24.1 seconds — ABNORMAL HIGH (ref 11.4–15.2)

## 2022-09-29 MED ORDER — HYDRALAZINE HCL 20 MG/ML IJ SOLN: 10.0000 mg | Freq: Four times a day (QID) | INTRAMUSCULAR | Status: DC | PRN

## 2022-09-29 NOTE — TOC Transition Note (Signed)
Transition of Care [facility]) - CM/SW Discharge Note   Patient Details  Name: Alexander Austin MRN: [id number] Date of Birth: 1946-05-30  Transition of Care [facility]) CM/SW Contact:  Vinie Sill, LCSW Phone Number: 09/29/2022, 1:38 PM   Clinical Narrative:     Patient will Discharge to: [place]  Discharge Date: 09/29/2022 Family Notified: Daughter,Joy Transport By: Family  Per MD patient is ready for discharge. RN, patient, and facility notified of discharge. Discharge Summary sent to facility. RN given number for [phone].    Clinical Social Worker signing off.  Alexander Austin, MSW, LCSW Clinical Social Worker     Final next level of care: Skilled Nursing Facility Barriers to Discharge: Continued Medical Work up   Patient Goals and CMS Choice Patient states their goals for this hospitalization and ongoing recovery are:: return home CMS Medicare.gov Compare Post Acute Care list provided to:: Patient Choice offered to / list presented to : Patient  Discharge Placement              Patient chooses bed at: [facility] Patient to be transferred to facility by: private care Name of family member notified: Caryl Asp, daughter Patient and family notified of of transfer: 09/29/22  Discharge Plan and Services   Discharge Planning Services: CM Consult Post Acute Care Choice: Home Health          DME Arranged: [employer] DME Agency: [place] Date DME Agency Contacted: 09/23/22 Time DME Agency Contacted: 17 Representative spoke with at DME Agency: [place]: RN, PT, Social Work [employer] Agency: [place] Date [place]: 09/15/22   Representative spoke with at [place]: [place] ([place]) Interventions     Readmission Risk Interventions    07/04/2020   11:18 AM 04/03/2020   12:10 PM  Readmission Risk Prevention Plan  Transportation Screening Complete Complete  PCP or  Specialist Appt within 3-5 Days  Complete  HRI or [place]  Complete  Social Work Consult for [place] Planning/Counseling  [place] Screening  Not Applicable  Medication Review Press [employer]) Referral to Pharmacy Complete  PCP or Specialist appointment within 3-5 days of discharge Complete   HRI or [place] Complete   SW Recovery Care/Counseling Consult Complete   [place] Patient Refused

## 2022-09-29 NOTE — Progress Notes (Signed)
[place] for warfarin Indication: stroke  Allergies  Allergen Reactions   Isosorbide Nitrate Anaphylaxis    Other reaction(s): Cardiovascular Arrest (ALLERGY/intolerance) Can take Sublingual Nitro.    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   Assessment: 76 yo male with CVA and was on apixaban PTA for afib. Plans are to change anticoagulation due to apixaban failure. He is noted on primidone which can decrease apixaban levels. The same effect is expected for rivaroxaban. Recommendations on use of dabigatran and edoxaban with primidone vary across sources (caution or avoidance).  Best option to avoid recurrent CVA is likely warfarin. On clopidogrel. Pharmacy consulted to dose warfarin.   Warfarin started on 10/3. INR remains therapeutic; no bleeding reported.  Goal of Therapy:  INR 2-3 Monitor platelets by anticoagulation protocol: Yes   Plan:  Continue Coumadin 2.'5mg'$  PO daily for now INR change to MWF checks for now  Erin Hearing PharmD., BCPS Clinical Pharmacist 09/29/2022 10:18 AM

## 2022-09-29 NOTE — Progress Notes (Signed)
Called report to RN in [facility].   Lavenia Atlas, RN

## 2022-09-29 NOTE — Discharge Summary (Signed)
Physician Discharge Summary   Patient: Alexander Austin MRN: [id number] DOB: [birth year]  Admit date:     09/13/2022  Discharge date: 09/29/22  Discharge Physician: Cordelia Poche, MD   PCP: Windy Fast, MD   Recommendations at discharge:  Follow up with PCP in 1 week Follow up with vascular surgery as scheduled on 10/19 Need INR checked to maintain INR 2-3 on Coumadin. 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 *  Hospital Course: Alexander Austin is a 76 y.o. male with a history of chronic systolic heart failure, CAD s/p CABG and ICD/pacemaker placement, paroxysmal atrial fibrillation on Eliquis, CVA, pulmonary hypertension CKD stage IV, renal cell cancer s/p nephrectomy. Patient presented secondary to right sided weakness and found to have an acute embolic stroke. Eliquis transitioned to Coumadin. Vascular surgery consulted for left carotid artery stenosis and patient underwent successful left endarterectomy. Patient recommended for SNF on discharge.  Assessment and Plan:  Acute embolic infarcts Code stroke called prior to admission. Initial CT head was significant for age indeterminate infarct in the posterior left lobe. MRI confirms small acute left frontotemporal embolic infarcts. CTA head and neck significant for severe stenosis at right vertebral artery origin, right subclavian artery, right M2 MCA and  bilateral V4 vertebral arteries in addition to moderate stenosis in paraclinoid right ICA. Carotid doppler significant for 70-79% left ICA stenosis. LDL of 49. Hemoglobin A1C of 4.7%. Transthoracic Echocardiogram significant for LVEF of 50-55%, severely dilated left atrium and no interatrial shunt. Neurology recommendations for Eliquis which was switched to Coumadin. PT/OT recommendations for SNF. Patient discharged on Lipitor, Coumadin and Plavix. Recommendation for Neurology follow-up in 4 weeks.  Left ICA stenosis Vascular surgery was consulted and performed a left carotid endarterectomy with bovine patch angioplasty. Recommendations for Coumadin and Plavix on discharge. Patient to follow-up with vascular surgery as an outpatient.  OSA Continue CPAP.  Acute on chronic diastolic heart failure History of systolic heart failure with improved EF. No associated hypoxia. LVEF of 50-55% with grade 2 diastolic dysfunction. Appears to be iatrogenic and secondary to holding patient's Lasix while inpatient. Lasix PO resumed with resolution of symptoms.  CKD stage IIIa Stable.  Primary hypertension Continue amlodipine and Coreg  Paroxysmal atrial fibrillation Previously on Eliquis. Transitioned to Coumadin. Continue Coreg.  CAD History of CABG. Continue Plavix.   Consultants: Neurology, Vascular surgery, Cardiology Procedures performed:  Transthoracic Echocardiogram 10/2: Left carotid endarterectomy with bovine patch angioplasty  Disposition: Skilled nursing facility Diet recommendation: Cardiac diet  DISCHARGE MEDICATION: Allergies as of 09/29/2022       Reactions   Isosorbide Nitrate Anaphylaxis   Other reaction(s): Cardiovascular Arrest (ALLERGY/intolerance) Can take Sublingual Nitro.   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   amLODipine 10 MG tablet Commonly known as: NORVASC Take 10 mg by mouth daily.   atorvastatin 80 MG tablet Commonly known as: LIPITOR Take 80 mg by mouth daily.   calcitRIOL 0.25 MCG capsule Commonly known as: ROCALTROL Take 0.25 mcg by mouth daily.   carbamide peroxide 6.5 % OTIC solution Commonly known as: DEBROX Place 5-10 drops into both ears 2 (two) times daily.   carvedilol 12.5 MG tablet Commonly known as: COREG Take 1 tablet (12.5 mg total) by mouth 2 (two) times daily with a meal. What changed:  medication strength how much to take when to take this   clopidogrel 75 MG tablet Commonly known as: PLAVIX Take 75 mg by mouth daily.   colchicine 0.6 MG tablet Take 0.5 tablets (0.3 mg total) by mouth 2 (two) times daily.   DSS 100 MG Caps Take 100 mg by mouth daily.   escitalopram 20 MG tablet Commonly known as: LEXAPRO Take 20 mg by mouth at bedtime.   Febuxostat 80 MG Tabs Take 80 mg by mouth daily.   furosemide 40 MG tablet Commonly known as: LASIX Take 1 tablet (40 mg total) by mouth daily. What changed: when to take this   pantoprazole 40 MG tablet Commonly known as: PROTONIX Take 40 mg by mouth daily.   potassium chloride 10 MEQ tablet Commonly known as: KLOR-CON M Take 10 mEq by mouth daily.   primidone 50 MG tablet Commonly known as: MYSOLINE Take 100 mg by mouth in the morning and at bedtime.   traZODone 50 MG tablet Commonly known as: DESYREL Take 25 mg by mouth at bedtime as needed for sleep.   Vitamin D (Ergocalciferol) 1.25 MG (50000 UNIT) Caps capsule Commonly known as: DRISDOL Take 50,000 Units by mouth every 7 (seven) days. 'Sunday   warfarin 2.5 MG  tablet Commonly known as: COUMADIN Take 1 tablet (2.5 mg total) by mouth daily at 4 PM.               Durable Medical Equipment  (From admission, onward)           Start     Ordered   09/23/22 1213  For home use only DME oxygen  Once       Question Answer Comment  Length of Need 6 Months   Mode or (Route) Nasal cannula   Liters per Minute 4   Frequency Continuous (stationary and portable oxygen unit needed)   Oxygen conserving device Yes   Oxygen delivery system Gas      10'$ /05/23 1212            Follow-up Information     [facility] Follow up on 11/07/2022.   Specialty: Vascular Surgery Why: 11:00 AM, For hospital follow-up Contact information: [address] [place] [phone]        Windy Fast, MD. Schedule an appointment as soon as possible for a visit in 1 week(s).   Specialty: Internal Medicine Contact information: [address] [place] [address] [phone]         Clinic, [place] Go on 09/27/2022.   Why: Please present to the Lab there at the [facility] clinic for INR check Monday- per Dr. Sherral Hammers you do  not need an appointment Hours are 8am-4:30pm- just show up anytime that's good for you Contact information: [place] [address] [phone]         Clinic, [place] Follow up.   Why: Home 02 arranged through the [facility]- with [facility] they will contact you regarding delivery Contact information: [place] [address] [phone]         Garvin Fila, MD. Schedule an appointment as soon as possible for a visit in 6 week(s).   Specialties: Neurology, Radiology Why: For hospital follow-up Contact information: [address] [place]  [address] [phone]                Discharge Exam: BP 133/60 (BP Location: Left Arm)   Pulse 62    Temp 98 F (36.7 C) (Oral)   Resp 20   Ht '6\' 1"'$  (1.854 m)   Wt 112.4 kg   SpO2 97%   BMI 32.69 kg/m   General exam: Appears calm and comfortable  Condition at discharge: stable  The results of significant diagnostics from this hospitalization (including imaging, microbiology, ancillary and laboratory) are listed below for reference.   Imaging Studies: US RENAL  Result Date: 09/27/2022 CLINICAL DATA:  Renal dysfunction EXAM: RENAL / URINARY TRACT ULTRASOUND COMPLETE COMPARISON:  Previous CT done on 01/30/2020 FINDINGS: Right Kidney: Renal measurements: 8.6 x 4.7 x 4.8 cm = volume: 102.4 mL. There is no hydronephrosis. There is cortical thinning. Right kidney is smaller than left, possibly due to previous partial removal. Left Kidney: Renal measurements: 10.9 x 6 x 5.6 cm = volume: 191.9 mL. There is no hydronephrosis. There is cortical thinning. Bladder: Appears normal for degree of bladder distention. Other: None. IMPRESSION: There is no hydronephrosis. Electronically Signed   By: Elmer Picker M.D.   On: 09/27/2022 09:23   DG Chest Port 1V same Day  Result Date: 09/21/2022 CLINICAL DATA:  A 76 year old male presents with dyspnea, history of CHF and hypertension with stroke. EXAM: PORTABLE CHEST 1 VIEW COMPARISON:  July 03, 2020 FINDINGS: Multi lead pacer defibrillator remains in place. Power pack over LEFT chest. EKG leads project over the chest. Image rotated slightly to the RIGHT. Cardiomediastinal contours and hilar structures are stable with signs of cardiac enlargement following median sternotomy for CABG. No lobar consolidation. No visible pneumothorax. Lung volumes are low normal. On limited assessment no acute skeletal findings. IMPRESSION: 1. Low lung volumes without acute cardiopulmonary disease. 2. Stable cardiac enlargement following median sternotomy for CABG with multi lead pacer defibrillator in place. Electronically Signed   By: Zetta Bills M.D.   On: 09/21/2022  14:32   VAS US CAROTID  Result Date: 09/15/2022 Carotid Arterial Duplex Study Patient Name:  Alexander Austin  Date of Exam:   09/15/2022 Medical Rec #: [id number]     Accession #:    [id number] Date of Birth: December 22, 1945     Patient Gender: M Patient Age:   69 years Exam Location:  [facility] Procedure:      VAS US CAROTID Referring Phys: Frederich Chick LAMA --------------------------------------------------------------------------------  Indications:       CVA and Sudden onset right sided weakness and numbness with                    difficulty ambulating. CTA neck showed severe stenosis at the  origin of the left ICA (likely greater than 80%). Risk Factors:      Hypertension, hyperlipidemia, past history of smoking. Other Factors:     Atrial fibrillation. Comparison Study:  09/13/2022- CTA neck Performing Technologist: Maudry Mayhew MHA, RDMS, RVT, RDCS  Examination Guidelines: A complete evaluation includes B-mode imaging, spectral Doppler, color Doppler, and power Doppler as needed of all accessible portions of each vessel. Bilateral testing is considered an integral part of a complete examination. Limited examinations for reoccurring indications may be performed as noted.  Right Carotid Findings: +----------+--------+-------+--------+----------------------+------------------+           PSV cm/sEDV    StenosisPlaque Description    Comments                             cm/s                                                    +----------+--------+-------+--------+----------------------+------------------+ CCA Prox  109     10                                                      +----------+--------+-------+--------+----------------------+------------------+ CCA Distal93      9                                    intimal thickening +----------+--------+-------+--------+----------------------+------------------+ ICA Prox  131     24             smooth and                                                                 heterogenous                             +----------+--------+-------+--------+----------------------+------------------+ ICA Distal158     29                                                      +----------+--------+-------+--------+----------------------+------------------+ ECA       89                                           intimal thickening +----------+--------+-------+--------+----------------------+------------------+ +----------+--------+-------+----------------+-------------------+           PSV cm/sEDV cmsDescribe        Arm Pressure (mmHG) +----------+--------+-------+----------------+-------------------+ [id number]             Multiphasic, WNL                    +----------+--------+-------+----------------+-------------------+ +---------+--------+--+--------+--+---------+ VertebralPSV cm/s31EDV  cm/s10Antegrade +---------+--------+--+--------+--+---------+  Left Carotid Findings: +----------+--------+--------+--------+------------------------------+---------+           PSV cm/sEDV cm/sStenosisPlaque Description            Comments  +----------+--------+--------+--------+------------------------------+---------+ CCA Prox  100     9                                                       +----------+--------+--------+--------+------------------------------+---------+ CCA Distal71      9               focal and calcific                      +----------+--------+--------+--------+------------------------------+---------+ ICA Prox  232     35              heterogenous, irregular and   Shadowing                                   calcific                                +----------+--------+--------+--------+------------------------------+---------+ ICA Mid   170     35                                                       +----------+--------+--------+--------+------------------------------+---------+ ICA Distal116     20                                                      +----------+--------+--------+--------+------------------------------+---------+ ECA       246                     heterogenous, irregular and   shadowing                                   calcific                                +----------+--------+--------+--------+------------------------------+---------+ +----------+--------+--------+----------------+-------------------+           PSV cm/sEDV cm/sDescribe        Arm Pressure (mmHG) +----------+--------+--------+----------------+-------------------+ Subclavian117             Multiphasic, WNL                    +----------+--------+--------+----------------+-------------------+ +---------+--------+--+--------+--+---------+ VertebralPSV cm/s64EDV cm/s12Antegrade +---------+--------+--+--------+--+---------+   Summary: Right Carotid: Velocities in the right ICA are consistent with a 1-39% stenosis. Left Carotid: Velocities in the left ICA are consistent with an upper range               1-39% stenosis by end diastolic velocity, and 24-23% stenosis by  peak systolic velocity. Vertebrals:  Bilateral vertebral arteries demonstrate antegrade flow. Subclavians: Normal flow hemodynamics were seen in bilateral subclavian              arteries. *See table(s) above for measurements and observations.  Electronically signed by Antony Contras MD on 09/15/2022 at 2:33:21 PM.    Final    MR BRAIN WO CONTRAST  Result Date: 09/15/2022 CLINICAL DATA:  Stroke, follow-up.  Right-sided weakness. EXAM: MRI HEAD WITHOUT CONTRAST TECHNIQUE: Multiplanar, multiecho pulse sequences of the brain and surrounding structures were obtained without intravenous contrast. COMPARISON:  Head CT and CTA 09/13/2022 FINDINGS: Brain: There is a small acute to early subacute cortical and subcortical  infarct in the left frontoparietal region. Multiple chronic cortical and subcortical infarcts are noted in the left greater than right frontal and parietal lobes and in the left occipital lobe. There are also small chronic bilateral cerebellar infarcts. There are a few scattered chronic cerebral and cerebellar microhemorrhages. Mild-to-moderate T2 hyperintensities elsewhere in the cerebral white matter bilaterally are nonspecific but compatible with chronic small vessel ischemic disease. T2 hyperintensities in the basal ganglia bilaterally reflect a combination of dilated perivascular spaces and chronic lacunar infarcts. Mild chronic small vessel changes are also present in the thalami and pons. No mass, midline shift, or extra-axial fluid collection is identified. There is moderate cerebral atrophy. Vascular: Major intracranial vascular flow voids are preserved. Skull and upper cervical spine: Unremarkable bone marrow signal. Sinuses/Orbits: Unremarkable orbits. Paranasal sinuses and mastoid air cells are clear. Other: None. IMPRESSION: 1. Small acute to early subacute left frontoparietal infarct. 2. Chronic ischemia with multiple chronic infarcts as above. Electronically Signed   By: Logan Bores M.D.   On: 09/15/2022 11:47   ECHOCARDIOGRAM COMPLETE  Result Date: 09/14/2022    ECHOCARDIOGRAM REPORT   Patient Name:   Alexander Austin Date of Exam: 09/14/2022 Medical Rec #:  [id number]    Height:       73.0 in Accession #:    [id number]   Weight:       247.8 lb Date of Birth:  01/14/1946    BSA:          2.357 m Patient Age:    64 years     BP:           155/72 mmHg Patient Gender: M            HR:           63 bpm. Exam Location:  Inpatient Procedure: 2D Echo, Cardiac Doppler, Color Doppler and Intracardiac            Opacification Agent Indications:    Stroke  History:        Patient has no prior history of Echocardiogram examinations. CAD                 and NSTEMI, CVA, Pulmonary HTN and Carotid Disease,                  Arrythmias:Atrial Fibrillation, Signs/Symptoms:Edema; Risk                 Factors:Hypertension and Current Smoker.  Sonographer:    Greer Pickerel Sonographer#2:  Raquel Sarna Senior Referring Phys: [id number] [place] T TU  Sonographer Comments: Technically difficult study due to poor echo windows, suboptimal parasternal window, suboptimal apical window, no subcostal window and patient is obese. Image acquisition challenging due to respiratory motion. IMPRESSIONS  1. Left ventricular ejection fraction, by estimation, is 50 to  55%. The left ventricle has low normal function. The left ventricle has no regional wall motion abnormalities. Left ventricular diastolic parameters are consistent with Grade II diastolic dysfunction (pseudonormalization).  2. Right ventricular systolic function is normal. The right ventricular size is normal. There is normal pulmonary artery systolic pressure.  3. Left atrial size was severely dilated.  4. The mitral valve is grossly normal. No evidence of mitral valve regurgitation. No evidence of mitral stenosis.  5. The aortic valve was not well visualized. Aortic valve regurgitation is trivial. Comparison(s): No significant change from prior study. Prior study not done with contrast. FINDINGS  Left Ventricle: Left ventricular ejection fraction, by estimation, is 50 to 55%. The left ventricle has low normal function. The left ventricle has no regional wall motion abnormalities. Definity contrast agent was given IV to delineate the left ventricular endocardial borders. The left ventricular internal cavity size was normal in size. Suboptimal image quality limits for assessment of left ventricular hypertrophy. Left ventricular diastolic parameters are consistent with Grade II diastolic dysfunction (pseudonormalization). Right Ventricle: The right ventricular size is normal. No increase in right ventricular wall thickness. Right ventricular systolic function is normal. There is normal pulmonary artery  systolic pressure. The tricuspid regurgitant velocity is 2.24 m/s, and  with an assumed right atrial pressure of 8 mmHg, the estimated right ventricular systolic pressure is 41.7 mmHg. Left Atrium: Left atrial size was severely dilated. Right Atrium: Right atrial size was normal in size. Pericardium: There is no evidence of pericardial effusion. Mitral Valve: The mitral valve is grossly normal. No evidence of mitral valve regurgitation. No evidence of mitral valve stenosis. Tricuspid Valve: The tricuspid valve is grossly normal. Tricuspid valve regurgitation is not demonstrated. No evidence of tricuspid stenosis. Aortic Valve: The aortic valve was not well visualized. Aortic valve regurgitation is trivial. Pulmonic Valve: The pulmonic valve was normal in structure. Pulmonic valve regurgitation is mild. No evidence of pulmonic stenosis. Aorta: The aortic root is normal in size and structure. IAS/Shunts: No atrial level shunt detected by color flow Doppler.  LEFT VENTRICLE PLAX 2D LVOT diam:     2.10 cm      Diastology LV SV:         73           LV e' medial:    5.93 cm/s LV SV Index:   31           LV E/e' medial:  15.6 LVOT Area:     3.46 cm     LV e' lateral:   6.15 cm/s                             LV E/e' lateral: 15.0  LV Volumes (MOD) LV vol d, MOD A4C: 198.0 ml LV vol s, MOD A4C: 96.2 ml LV SV MOD A4C:     198.0 ml RIGHT VENTRICLE RV S prime:     5.40 cm/s TAPSE (M-mode): 2.0 cm LEFT ATRIUM              Index        RIGHT ATRIUM           Index LA Vol (A2C):   113.0 ml 47.95 ml/m  RA Area:     22.90 cm LA Vol (A4C):   150.0 ml 63.65 ml/m  RA Volume:   64.00 ml  27.16 ml/m LA Biplane Vol: 145.0 ml 61.52 ml/m  AORTIC VALVE  PULMONIC VALVE LVOT Vmax:   91.10 cm/s  PR End Diast Vel: 2.84 msec LVOT Vmean:  68.100 cm/s LVOT VTI:    0.212 m  AORTA Ao Root diam: 4.10 cm Ao Asc diam:  3.50 cm MITRAL VALVE               TRICUSPID VALVE MV Area (PHT): 3.66 cm    TR Peak grad:   20.1 mmHg MV Decel Time:  207 msec    TR Vmax:        224.00 cm/s MV E velocity: 92.30 cm/s MV A velocity: 72.40 cm/s  SHUNTS MV E/A ratio:  1.27        Systemic VTI:  0.21 m                            Systemic Diam: 2.10 cm Rudean Haskell MD Electronically signed by Rudean Haskell MD Signature Date/Time: 09/14/2022/2:10:45 PM    Final    CT ANGIO HEAD NECK W WO CM (CODE STROKE)  Result Date: 09/13/2022 CLINICAL DATA:  By history: Neuro deficit, acute, stroke suspected. Right-sided weakness. EXAM: CT ANGIOGRAPHY HEAD AND NECK TECHNIQUE: Multidetector CT imaging of the head and neck was performed using the standard protocol during bolus administration of intravenous contrast. Multiplanar CT image reconstructions and MIPs were obtained to evaluate the vascular anatomy. Carotid stenosis measurements (when applicable) are obtained utilizing NASCET criteria, using the distal internal carotid diameter as the denominator. RADIATION DOSE REDUCTION: This exam was performed according to the departmental dose-optimization program which includes automated exposure control, adjustment of the mA and/or kV according to patient size and/or use of iterative reconstruction technique. CONTRAST:  28m OMNIPAQUE IOHEXOL 350 MG/ML SOLN COMPARISON:  Noncontrast head CT performed earlier today 09/13/2022. FINDINGS: CTA NECK FINDINGS Aortic arch: Standard aortic branching. Atherosclerotic plaque within the visualized aortic arch and proximal major branch vessels of the neck. Streak and beam hardening artifact arising from a dense right-sided contrast bolus partially obscures the right subclavian artery. However, there is an apparent severe stenosis within the mid right subclavian artery (series 8, image 205). Right carotid system: CCA and ICA patent within the neck. Atherosclerotic plaque within the distal CCA, about the carotid bifurcation and within the proximal ICA. Atherosclerotic narrowing of the proximal ICA of up to 60%. Foci ulcerated plaque  within the carotid bulb. Left carotid system: CCA and ICA patent within the neck. Atherosclerotic plaque at the CCA origin, within the distal CCA, about the carotid bifurcation and within the proximal to mid ICA. Stenosis is greatest at the origin of the left ICA (severe and likely greater than 80% at this site) (series 7, image 236). Vertebral arteries: Vertebral arteries patent within the neck. Atherosclerotic disease within these vessels. Most notably, there is multifocal moderate severe stenoses within the at the right vertebral artery origin and within the right V1 segment. Additionally, there is a moderate/severe stenosis at the origin of the left vertebral artery. Skeleton: Multilevel vertebral ankylosis within the cervical and upper thoracic spine. Cervical spondylosis. At C3-C4, a posterior disc osteophyte complex and ossification of the posterior longitudinal ligament contribute to apparent severe spinal canal stenosis. No acute fracture or aggressive osseous lesion. Other neck: Subcentimeter thyroid nodules, not meeting consensus criteria for ultrasound follow-up based on size. No follow-up imaging is recommended. Reference: J Am Coll Radiol. 2015 Feb;12(2): 143-50. Upper chest: No consolidation within the imaged lung apices. Review of the MIP images confirms the above findings  CTA HEAD FINDINGS Anterior circulation: The intracranial internal carotid arteries are patent. Atherosclerotic plaque within both vessels. No more than mild stenosis of the intracranial right ICA. Up to moderate stenosis of the paraclinoid left ICA. The M1 middle cerebral arteries are patent. Atherosclerotic irregularity of the M2 and more distal MCA vessels, bilaterally. Most notably, there is a severe stenosis within a mid M1 right MCA vessel (series 12, image 40). No M2 proximal branch occlusion is identified. The anterior cerebral arteries are patent. No intracranial aneurysm is identified. Posterior circulation: The  intracranial vertebral arteries are patent. Sites of severe stenosis within the proximal V4 segments, bilaterally. The basilar artery is patent. Fenestration within the proximal basilar artery. The posterior cerebral arteries are patent. Posterior communicating arteries are hypoplastic or absent, bilaterally. Venous sinuses: Within the limitations of contrast timing, no convincing thrombus. Anatomic variants: As described. Review of the MIP images confirms the above findings No emergent large vessel occlusion identified. These results were communicated to Dr. Lorrin Goodell At 6:28 pmon 9/25/2023by text page via the [facility] messaging system. IMPRESSION: CTA neck: 1. The common carotid and internal carotid arteries are patent within the neck. Atherosclerotic plaque bilaterally. Most notably, there is a severe stenosis at the origin of the left ICA (likely greater than 80%). 2. Vertebral arteries patent within the neck. Sites of moderate/severe stenosis at the right vertebral artery origin, within the right V1 segment and at the origin of the left vertebral artery. 3. 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 4. Aortic Atherosclerosis (ICD10-I70.0). 5. 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 Consider a dedicated cervical spine MRI for further evaluation. CTA head: 1. No intracranial large vessel occlusion is identified. 2. Intracranial atherosclerotic disease with multifocal stenoses, most notably as follows. 3. Up to moderate stenosis within the paraclinoid left ICA. 4. Severe focal stenosis within a mid M2 right MCA vessel. 5. Sites of severe stenosis within the proximal V4 vertebral arteries, bilaterally. Electronically Signed   By: Kellie Simmering D.O.   On: 09/13/2022 18:31   CT HEAD CODE STROKE WO CONTRAST  Result Date: 09/13/2022 CLINICAL DATA:  Code stroke. Neuro deficit, acute, stroke suspected. Right-sided weakness. EXAM: CT HEAD WITHOUT CONTRAST TECHNIQUE: Contiguous axial images were  obtained from the base of the skull through the vertex without intravenous contrast. RADIATION DOSE REDUCTION: This exam was performed according to the departmental dose-optimization program which includes automated exposure control, adjustment of the mA and/or kV according to patient size and/or use of iterative reconstruction technique. COMPARISON:  Head CT 09/07/2014 (images available, report unavailable). FINDINGS: Brain: Moderate cerebral atrophy. Small cortical/subcortical infarct within the mid to posterior left frontal lobe (within the left MCA/ACA watershed territory), new from the prior head CT of 09/07/2014, but otherwise age-indeterminate. Small chronic cortical/subcortical infarcts scattered elsewhere within the bilateral cerebral hemispheres. Background mild patchy and ill-defined hypoattenuation within the cerebral white matter, nonspecific but compatible with chronic small vessel disease. Chronic lacunar infarcts within bilateral basal ganglia. Small chronic infarcts within the bilateral cerebellar hemispheres. Vascular: No hyperdense vessel.  Atherosclerotic calcifications. Skull: No fracture or aggressive osseous lesion. Sinuses/Orbits: No mass or acute finding within the imaged orbits. Small mucous retention cysts within the right maxillary sinus. ASPECTS [facility] Stroke Program Early CT Score) - Ganglionic level infarction (caudate, lentiform nuclei, internal capsule, insula, M1-M3 cortex): 7 - Supraganglionic infarction (M4-M6 cortex): 2 Total score (0-10 with 10 being normal): 9 These results were communicated to Dr. Lorrin Goodell At 6:10 pmon 9/25/2023by text page via the [facility]  messaging system. IMPRESSION: Small cortical/subcortical infarct within the mid-to-posterior left frontal lobe (left MCA/ACA watershed territory), new from the prior head CT of 09/07/2014, but otherwise age-indeterminate. Background parenchymal atrophy, chronic small-vessel ischemic disease and chronic infarcts, as  described. Electronically Signed   By: Kellie Simmering D.O.   On: 09/13/2022 18:10    Microbiology: Results for orders placed or performed during the hospital encounter of 09/13/22  Resp Panel by RT-PCR (Flu A&B, Covid) Anterior Nasal Swab     Status: None   Collection Time: 09/13/22  6:31 PM   Specimen: Anterior Nasal Swab  Result Value Ref Range Status   SARS Coronavirus 2 by RT PCR NEGATIVE NEGATIVE Final    Comment: (NOTE) SARS-CoV-2 target nucleic acids are NOT DETECTED.  The SARS-CoV-2 RNA is generally detectable in upper respiratory specimens during the acute phase of infection. The lowest concentration of SARS-CoV-2 viral copies this assay can detect is 138 copies/mL. A negative result does not preclude SARS-Cov-2 infection and should not be used as the sole basis for treatment or other patient management decisions. A negative result may occur with  improper specimen collection/handling, submission of specimen other than nasopharyngeal swab, presence of viral mutation(s) within the areas targeted by this assay, and inadequate number of viral copies(<138 copies/mL). A negative result must be combined with clinical observations, patient history, and epidemiological information. The expected result is Negative.  Fact Sheet for Patients:  EntrepreneurPulse.com.au  Fact Sheet for Healthcare Providers:  IncredibleEmployment.be  This test is no t yet approved or cleared by the [place] FDA and  has been authorized for detection and/or diagnosis of SARS-CoV-2 by FDA under an Emergency Use Authorization (EUA). This EUA will remain  in effect (meaning this test can be used) for the duration of the COVID-19 declaration under Section 564(b)(1) of the Act, 21 U.S.C.section 360bbb-3(b)(1), unless the authorization is terminated  or revoked sooner.       Influenza A by PCR NEGATIVE NEGATIVE Final   Influenza B by PCR NEGATIVE NEGATIVE Final     Comment: (NOTE) The Xpert Xpress SARS-CoV-2/FLU/RSV plus assay is intended as an aid in the diagnosis of influenza from Nasopharyngeal swab specimens and should not be used as a sole basis for treatment. Nasal washings and aspirates are unacceptable for Xpert Xpress SARS-CoV-2/FLU/RSV testing.  Fact Sheet for Patients: EntrepreneurPulse.com.au  Fact Sheet for Healthcare Providers: IncredibleEmployment.be  This test is not yet approved or cleared by the [place] FDA and has been authorized for detection and/or diagnosis of SARS-CoV-2 by FDA under an Emergency Use Authorization (EUA). This EUA will remain in effect (meaning this test can be used) for the duration of the COVID-19 declaration under Section 564(b)(1) of the Act, 21 U.S.C. section 360bbb-3(b)(1), unless the authorization is terminated or revoked.  Performed at [facility], [place] [address], [place], [place] [address]   Surgical pcr screen     Status: None   Collection Time: 09/20/22  4:42 AM   Specimen: Nasal Mucosa; Nasal Swab  Result Value Ref Range Status   MRSA, PCR NEGATIVE NEGATIVE Final   Staphylococcus aureus NEGATIVE NEGATIVE Final    Comment: (NOTE) The Xpert SA Assay (FDA approved for NASAL specimens in patients 58 years of age and older), is one component of a comprehensive surveillance program. It is not intended to diagnose infection nor to guide or monitor treatment. Performed at [facility], [place] [address] [place], [place] [address]     Labs: CBC: Recent  Labs  Lab 09/26/22 0122 09/27/22 0018  WBC 6.7 6.4  HGB 11.6* 11.4*  HCT 33.0* 32.5*  MCV 88.0 88.3  PLT 173 461   Basic Metabolic Panel: Recent Labs  Lab 09/25/22 0113 09/26/22 0122 09/27/22 0018 09/28/22 0321 09/29/22 0631  NA 141 135 138 139 139  K 3.9 3.6 3.8 3.8 4.0  CL 106 104 104 107 111  CO2 '27 24 23 22 22  '$ GLUCOSE 98 106* 115* 111* 89  BUN 27* 32* 39* 32* 25*   CREATININE 1.78* 2.01* 2.10* 1.71* 1.58*  CALCIUM 9.0 8.6* 9.0 8.6* 8.7*   Discharge time spent: 35 minutes.  Signed: Cordelia Poche, MD [facility] 09/29/2022

## 2022-09-29 NOTE — Progress Notes (Signed)
Mobility Specialist Progress Note    09/29/22 0958  Mobility  Activity Ambulated with assistance in hallway  Level of Assistance Contact guard assist, steadying assist  Assistive Device Four wheel walker  Distance Ambulated (ft) 170 ft  Activity Response Tolerated well  Mobility Referral Yes  $Mobility charge 1 Mobility   Pre-Mobility: 84 HR, 96% SpO2 During Mobility: 121 HR Post-Mobility: 72 HR  Pt received sitting EOB and agreeable. No complaints on walk. Returned to chair with call bell in reach.    Hildred Alamin Mobility Specialist

## 2022-09-29 NOTE — Progress Notes (Signed)
TRH night cross cover note:   I was contacted by RN regarding conflict in existing prn IV antihypertensive orders.  Specifically existing prn labetalol order associated with parameters to include indication for systolic blood pressure greater than 160, but with stipulation that if heart rate less than 60, then IV hydralazine however, existing order for as needed hydralazine includes parameter of systolic blood pressure greater than 180.  As the patient's systolic blood pressure is currently in the 170s, there is no existing order for prn IV hydralazine that is attentive.   I subsequently modified existing order for prn IV hydralazine to systolic blood pressure parameter, changing indication for systolic blood pressure greater than 160.    Babs Bertin, DO Hospitalist

## 2022-09-29 NOTE — Hospital Course (Signed)
Alexander Austin is a 76 y.o. male with a history of chronic systolic heart failure, CAD s/p CABG and ICD/pacemaker placement, paroxysmal atrial fibrillation on Eliquis, CVA, pulmonary hypertension CKD stage IV, renal cell cancer s/p nephrectomy. Patient presented secondary to right sided weakness and found to have an acute embolic stroke. Eliquis transitioned to Coumadin. Vascular surgery consulted for left carotid artery stenosis and patient underwent successful left endarterectomy. Patient recommended for SNF on discharge.

## 2022-09-29 NOTE — Plan of Care (Signed)
  Problem: Education: Goal: Knowledge of disease or condition will improve Outcome: Progressing Goal: Knowledge of secondary prevention will improve (SELECT ALL) Outcome: Progressing   

## 2022-10-05 NOTE — Progress Notes (Unsigned)
POST OPERATIVE OFFICE NOTE    CC:  F/u for surgery  HPI:  This is a 76 y.o. male who is s/p left CEA on 09/20/2022 by Dr. Scot Dock for symptomatic carotid artery stenosis.   Post operatively, the pt was taken off Eliquis and was to start Edoxaban but did require pre authorization from the [place] and therefore, pt was started on coumadin.  He was not on asa due to risk of bleeding with triple therapy as he was also on Plavix.   His carotid duplex revealed that the right ICA was 1-39% stenosis.    Pt returns today for follow up.  Pt states he is doing well.  He is back at his baseline.  He is not having any trouble swallowing.  He is compliant with his plavix/statin.  He is now on coumadin and INR on 10/11 was 2.2. he is on coumadin for PAF.   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     Allergies  Allergen Reactions   Isosorbide Nitrate Anaphylaxis    Other reaction(s): Cardiovascular Arrest (ALLERGY/intolerance) Can take Sublingual Nitro.    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     amLODipine (NORVASC) 10 MG tablet Take 10 mg by mouth daily.     atorvastatin (LIPITOR) 80 MG tablet Take 80 mg by mouth daily.      calcitRIOL (ROCALTROL) 0.25 MCG capsule Take 0.25 mcg by mouth daily.      carbamide peroxide (DEBROX) 6.5 % OTIC solution Place 5-10 drops into both ears 2 (two) times daily.     carvedilol (COREG) 12.5 MG tablet Take 1 tablet (12.5 mg total) by mouth 2 (two) times daily with a meal. 60 tablet 1   clopidogrel (PLAVIX) 75 MG tablet Take 75 mg by mouth daily.     colchicine 0.6 MG tablet Take 0.5 tablets (0.3 mg total) by mouth 2 (two) times daily. 30 tablet 3   Docusate Sodium (DSS) 100 MG CAPS Take 100 mg by mouth daily.      escitalopram (LEXAPRO) 20 MG tablet Take 20 mg by mouth at bedtime.     Febuxostat 80 MG TABS Take 80 mg by mouth daily.     furosemide (LASIX) 40 MG tablet Take 1 tablet (40 mg total) by mouth daily. (Patient taking differently: Take 40 mg by mouth 2 (two) times daily.) 30 tablet 0   pantoprazole (PROTONIX) 40 MG tablet Take 40 mg by mouth daily.      potassium chloride (KLOR-CON M) 10 MEQ tablet Take 10 mEq by mouth daily.     primidone (MYSOLINE) 50 MG tablet Take 100 mg by mouth in the morning and at bedtime.      traZODone (DESYREL) 50 MG tablet Take 25 mg by mouth at bedtime as needed for sleep.     Vitamin D, Ergocalciferol, (DRISDOL) 1.25 MG (50000 UNIT) CAPS capsule Take 50,000 Units by mouth every 7 (seven) days. Sunday     warfarin (COUMADIN) 2.5 MG tablet Take 1 tablet (2.5 mg total) by mouth daily at 4 PM. 30 tablet 1   No current facility-administered medications for this visit.     ROS:  See HPI  Physical Exam:    Today's Vitals  10/07/22 1124  BP: (!) 150/73  Pulse: 62  Resp: 20  Temp: 97.8 F (36.6 C)  TempSrc: Temporal  SpO2: 96%  Height: '6\' 1"'$  (1.854 m)   Body mass index is 32.69 kg/m.   Incision:  healed nicely Neuro: moving all extremities equally; tongue is midline     Assessment/Plan:  This is a 76 y.o. male who is s/p: left CEA on 09/20/2022 by Dr. Scot Dock for symptomatic carotid artery stenosis.    -pt doing well since surgery.  Neuro in tact; no dysphagia.   -f/u in 9 months with carotid duplex.  Discussed with him to head to ER should he develop any neurological sx.   -continue statin/plavix   Leontine Locket, [facility] [phone]   Clinic MD:  Donzetta Matters

## 2022-10-07 ENCOUNTER — Ambulatory Visit (INDEPENDENT_AMBULATORY_CARE_PROVIDER_SITE_OTHER): Payer: No Typology Code available for payment source | Admitting: Physician Assistant

## 2022-10-07 VITALS — BP 150/73 | HR 62 | Temp 97.8°F | Resp 20 | Ht 73.0 in

## 2022-10-07 DIAGNOSIS — Z9889 Other specified postprocedural states: Secondary | ICD-10-CM

## 2022-10-08 ENCOUNTER — Other Ambulatory Visit: Payer: Self-pay

## 2022-10-08 DIAGNOSIS — Z9889 Other specified postprocedural states: Secondary | ICD-10-CM

## 2022-11-07 ENCOUNTER — Inpatient Hospital Stay (HOSPITAL_COMMUNITY)
Admission: EM | Admit: 2022-11-07 | Discharge: 2022-11-15 | DRG: 291 | Disposition: A | Discharge status: Home Health | Payer: No Typology Code available for payment source | Attending: Internal Medicine | Admitting: Internal Medicine

## 2022-11-07 ENCOUNTER — Emergency Department (HOSPITAL_COMMUNITY): Payer: No Typology Code available for payment source

## 2022-11-07 ENCOUNTER — Other Ambulatory Visit: Payer: Self-pay

## 2022-11-07 DIAGNOSIS — Z9861 Coronary angioplasty status: Secondary | ICD-10-CM

## 2022-11-07 DIAGNOSIS — Z951 Presence of aortocoronary bypass graft: Secondary | ICD-10-CM

## 2022-11-07 DIAGNOSIS — I252 Old myocardial infarction: Secondary | ICD-10-CM

## 2022-11-07 DIAGNOSIS — I10 Essential (primary) hypertension: Secondary | ICD-10-CM | POA: Diagnosis present

## 2022-11-07 DIAGNOSIS — I130 Hypertensive heart and chronic kidney disease with heart failure and stage 1 through stage 4 chronic kidney disease, or unspecified chronic kidney disease: Secondary | ICD-10-CM | POA: Diagnosis not present

## 2022-11-07 DIAGNOSIS — I509 Heart failure, unspecified: Secondary | ICD-10-CM

## 2022-11-07 DIAGNOSIS — K219 Gastro-esophageal reflux disease without esophagitis: Secondary | ICD-10-CM | POA: Diagnosis present

## 2022-11-07 DIAGNOSIS — E785 Hyperlipidemia, unspecified: Secondary | ICD-10-CM | POA: Diagnosis present

## 2022-11-07 DIAGNOSIS — E876 Hypokalemia: Secondary | ICD-10-CM | POA: Diagnosis present

## 2022-11-07 DIAGNOSIS — R339 Retention of urine, unspecified: Secondary | ICD-10-CM | POA: Diagnosis present

## 2022-11-07 DIAGNOSIS — I5033 Acute on chronic diastolic (congestive) heart failure: Secondary | ICD-10-CM | POA: Diagnosis present

## 2022-11-07 DIAGNOSIS — R0602 Shortness of breath: Secondary | ICD-10-CM | POA: Diagnosis not present

## 2022-11-07 DIAGNOSIS — Z7901 Long term (current) use of anticoagulants: Secondary | ICD-10-CM

## 2022-11-07 DIAGNOSIS — Z9889 Other specified postprocedural states: Secondary | ICD-10-CM

## 2022-11-07 DIAGNOSIS — G4730 Sleep apnea, unspecified: Secondary | ICD-10-CM | POA: Diagnosis present

## 2022-11-07 DIAGNOSIS — R338 Other retention of urine: Secondary | ICD-10-CM | POA: Diagnosis not present

## 2022-11-07 DIAGNOSIS — I5043 Acute on chronic combined systolic (congestive) and diastolic (congestive) heart failure: Secondary | ICD-10-CM | POA: Diagnosis present

## 2022-11-07 DIAGNOSIS — Z888 Allergy status to other drugs, medicaments and biological substances status: Secondary | ICD-10-CM

## 2022-11-07 DIAGNOSIS — M109 Gout, unspecified: Secondary | ICD-10-CM | POA: Diagnosis present

## 2022-11-07 DIAGNOSIS — Z79899 Other long term (current) drug therapy: Secondary | ICD-10-CM

## 2022-11-07 DIAGNOSIS — Z95810 Presence of automatic (implantable) cardiac defibrillator: Secondary | ICD-10-CM

## 2022-11-07 DIAGNOSIS — Z8249 Family history of ischemic heart disease and other diseases of the circulatory system: Secondary | ICD-10-CM

## 2022-11-07 DIAGNOSIS — Z823 Family history of stroke: Secondary | ICD-10-CM

## 2022-11-07 DIAGNOSIS — N179 Acute kidney failure, unspecified: Secondary | ICD-10-CM | POA: Diagnosis not present

## 2022-11-07 DIAGNOSIS — Z8673 Personal history of transient ischemic attack (TIA), and cerebral infarction without residual deficits: Secondary | ICD-10-CM

## 2022-11-07 DIAGNOSIS — Z608 Other problems related to social environment: Secondary | ICD-10-CM | POA: Diagnosis present

## 2022-11-07 DIAGNOSIS — N401 Enlarged prostate with lower urinary tract symptoms: Secondary | ICD-10-CM | POA: Diagnosis present

## 2022-11-07 DIAGNOSIS — I480 Paroxysmal atrial fibrillation: Secondary | ICD-10-CM | POA: Diagnosis present

## 2022-11-07 DIAGNOSIS — Z6831 Body mass index (BMI) 31.0-31.9, adult: Secondary | ICD-10-CM

## 2022-11-07 DIAGNOSIS — Z7902 Long term (current) use of antithrombotics/antiplatelets: Secondary | ICD-10-CM

## 2022-11-07 DIAGNOSIS — N1831 Chronic kidney disease, stage 3a: Secondary | ICD-10-CM | POA: Diagnosis present

## 2022-11-07 DIAGNOSIS — J9601 Acute respiratory failure with hypoxia: Secondary | ICD-10-CM | POA: Diagnosis present

## 2022-11-07 DIAGNOSIS — Z87891 Personal history of nicotine dependence: Secondary | ICD-10-CM

## 2022-11-07 DIAGNOSIS — Z91013 Allergy to seafood: Secondary | ICD-10-CM

## 2022-11-07 DIAGNOSIS — E669 Obesity, unspecified: Secondary | ICD-10-CM | POA: Diagnosis present

## 2022-11-07 LAB — PROTIME-INR
INR: 1.2 (ref 0.8–1.2)
Prothrombin Time: 15.4 seconds — ABNORMAL HIGH (ref 11.4–15.2)

## 2022-11-07 LAB — CBC
HCT: 34.9 % — ABNORMAL LOW (ref 39.0–52.0)
Hemoglobin: 11 g/dL — ABNORMAL LOW (ref 13.0–17.0)
MCH: 29.4 pg (ref 26.0–34.0)
MCHC: 31.5 g/dL (ref 30.0–36.0)
MCV: 93.3 fL (ref 80.0–100.0)
Platelets: 171 10*3/uL (ref 150–400)
RBC: 3.74 MIL/uL — ABNORMAL LOW (ref 4.22–5.81)
RDW: 14.6 % (ref 11.5–15.5)
WBC: 4.3 10*3/uL (ref 4.0–10.5)
nRBC: 0 % (ref 0.0–0.2)

## 2022-11-07 NOTE — ED Provider Notes (Signed)
[facility] Emergency Department Provider Note MRN:  [id number]  Arrival date & time: 11/08/22     Chief Complaint   Shortness of Breath   History of Present Illness   Alexander Austin is a 76 y.o. year-old male with a history of CKD, CHF, A-fib, stroke presenting to the ED with chief complaint of shortness of breath.  Gradually worsening shortness of breath over the past 2 days.  Worse when laying flat.  Chest pain earlier this evening that improved after nitroglycerin with EMS.  Does not use oxygen at home, currently requiring 5 L nasal cannula.  Has noticed increased swelling in the legs as well.  Was recently in the hospital for stroke.  Review of Systems  A thorough review of systems was obtained and all systems are negative except as noted in the HPI and PMH.   Patient's Health History    Past Medical History:  Diagnosis Date   A-fib [facility])    Anxiety    Benign prostate hyperplasia    Cancer ([place])    Kidney   Chronic kidney disease    Congestive heart failure (CHF) (HCC)    CVA (cerebral infarction)    Depression    Dyspnea    GERD (gastroesophageal reflux disease)    Gout    Hematospermia 01/13/2012   History of myocardial infarction 1993   Hyperlipidemia    Hypertension    Sleep apnea    Stroke ([place])    Vitamin B 12 deficiency    Weakness of left leg 03/28/2020    Past Surgical History:  Procedure Laterality Date   ANGIOPLASTY     X 8    BACK SURGERY     CHOLECYSTECTOMY     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   ENDARTERECTOMY Left 09/20/2022   Procedure: ENDARTERECTOMY CAROTID WITH 1 CM X 6CM BIOLOGIC PATCH.;  Surgeon: Angelia Mould, MD;  Location: [facility] OR;  Service: Vascular;  Laterality: Left;   SAPHENOUS VEIN GRAFT RESECTION  02/05/15    Family History  Problem Relation Age of Onset   Hypertension Mother    Heart disease Mother    Stroke Mother    Hypertension Father     Social History   Socioeconomic History    Marital status: Divorced    Spouse name: Not on file   Number of children: Not on file   Years of education: Not on file   Highest education level: Not on file  Occupational History   Occupation: retired  Tobacco Use   Smoking status: Former    Types: Cigarettes    Quit date: 07/24/1982    Years since quitting: 40.3   Smokeless tobacco: Never  Vaping Use   Vaping Use: Never used  Substance and Sexual Activity   Alcohol use: No    Alcohol/week: 0.0 standard drinks of alcohol   Drug use: No   Sexual activity: Not Currently  Other Topics Concern   Not on file  Social History Narrative   Not on file   Social Determinants of Health   Financial Resource Strain: Not on file  Food Insecurity: No Food Insecurity (09/14/2022)   Hunger Vital Sign    Worried About Running Out of Food in the Last Year: Never true    Ran Out of Food in the Last Year: Never true  Transportation Needs: No Transportation Needs (09/14/2022)   PRAPARE - Transportation    Lack of Transportation (Medical): No    Lack of  Transportation (Non-Medical): No  Physical Activity: Not on file  Stress: Not on file  Social Connections: Not on file  Intimate Partner Violence: Not At Risk (09/14/2022)   Humiliation, Afraid, Rape, and Kick questionnaire    Fear of Current or Ex-Partner: No    Emotionally Abused: No    Physically Abused: No    Sexually Abused: No     Physical Exam   Vitals:   11/07/22 2322 11/07/22 2326  BP: (!) 169/74   Pulse: 61   Resp: 15   SpO2: 98% 98%    CONSTITUTIONAL: Chronically ill-appearing, NAD NEURO/PSYCH:  Alert and oriented x 3, moves all extremities equally, endorses more subjective mild weakness to the right arm and leg EYES:  eyes equal and reactive ENT/NECK:  no LAD, no JVD CARDIO: Regular rate, well-perfused, normal S1 and S2 PULM:  CTAB no wheezing or rhonchi GI/GU:  non-distended, non-tender MSK/SPINE:  No gross deformities, no edema SKIN:  no rash,  atraumatic   *Additional and/or pertinent findings included in MDM below  Diagnostic and Interventional Summary    EKG Interpretation  Date/Time:  Sunday November 07 2022 23:23:52 EST Ventricular Rate:  65 PR Interval:    QRS Duration: 157 QT Interval:  491 QTC Calculation: 511 R Axis:   -59 Text Interpretation: VENTRICULAR PACED RHYTHM No significant change was found Confirmed by Gerlene Fee [phone]) on 11/07/2022 11:31:52 PM       Labs Reviewed  CBC - Abnormal; Notable for the following components:      Result Value   RBC 3.74 (*)    Hemoglobin 11.0 (*)    HCT 34.9 (*)    All other components within normal limits  COMPREHENSIVE METABOLIC PANEL - Abnormal; Notable for the following components:   Chloride 113 (*)    Glucose, Bld 104 (*)    Creatinine, Ser 1.36 (*)    Total Protein 5.7 (*)    Albumin 3.2 (*)    GFR, Estimated 54 (*)    All other components within normal limits  BRAIN NATRIURETIC PEPTIDE - Abnormal; Notable for the following components:   B Natriuretic Peptide 736.8 (*)    All other components within normal limits  PROTIME-INR - Abnormal; Notable for the following components:   Prothrombin Time 15.4 (*)    All other components within normal limits  TROPONIN I (HIGH SENSITIVITY) - Abnormal; Notable for the following components:   Troponin I (High Sensitivity) 21 (*)    All other components within normal limits    DG Chest Port 1 View  Final Result      Medications  furosemide (LASIX) injection 40 mg (has no administration in time range)     Procedures  /  Critical Care .Critical Care  Performed by: Maudie Flakes, MD Authorized by: Maudie Flakes, MD   Critical care provider statement:    Critical care time (minutes):  45   Critical care was necessary to treat or prevent imminent or life-threatening deterioration of the following conditions:  Respiratory failure   Critical care was time spent personally by me on the following activities:   Development of treatment plan with patient or surrogate, discussions with consultants, evaluation of patient's response to treatment, examination of patient, ordering and review of laboratory studies, ordering and review of radiographic studies, ordering and performing treatments and interventions, pulse oximetry, re-evaluation of patient's condition and review of old charts   ED Course and Medical Decision Making  Initial Impression and Ddx Favoring CHF exacerbation  given the orthopnea, leg swelling, lung sounds, hypertensive presentation.  Awaiting chest x-ray, labs, EKG.  Other considerations include ACS, pneumonia, pneumothorax.  PE felt to be less likely.  Given the gradual nature and the patient's anticoagulated status.  Past medical/surgical history that increases complexity of ED encounter: CHF, stroke  Interpretation of Diagnostics I personally reviewed the EKG and my interpretation is as follows: Paced rhythm, no significant change from prior, I also personally interpreted the chest x-ray, suspicious for pulmonary edema  Labs revealed minimally elevated troponin, elevated BNP, otherwise no significant blood count or electrolyte disturbance.  Patient Reassessment and Ultimate Disposition/Management Clinical Course as of 11/08/22 0028  Mon Nov 08, 2022  0004 Patient sitting comfortably at this time on 4L [place], does not need bipap.  No chest pain.  Awaiting labs.  Suspect will provide lasix and admit to medicine. [MB]    Clinical Course User Index [MB] Maudie Flakes, MD     We will admit to medicine for ongoing care.  Providing Lasix.  Patient management required discussion with the following services or consulting groups:  Hospitalist Service  Complexity of Problems Addressed Acute illness or injury that poses threat of life of bodily function  Additional Data Reviewed and Analyzed Further history obtained from: EMS on arrival  Additional Factors Impacting ED Encounter  Risk Consideration of hospitalization  Barth Kirks. Sedonia Small, MD [place] [email or web address]  Final Clinical Impressions(s) / ED Diagnoses     ICD-10-CM   1. Acute respiratory failure with hypoxia (HCC)  J96.01     2. Acute on chronic congestive heart failure, unspecified heart failure type ([place])  I50.9       ED Discharge Orders     None        Discharge Instructions Discussed with and Provided to Patient:   Discharge Instructions   None      Maudie Flakes, MD 11/08/22 [phone]

## 2022-11-07 NOTE — ED Triage Notes (Signed)
BIB EMS from home for increased SOB x 2 day hx CHF. Increased LLE swelling. PT 87%RA, 95% 5L [place]  EMS VS-266/188, 2x nitro 190/100, 70s

## 2022-11-08 ENCOUNTER — Encounter (HOSPITAL_COMMUNITY): Payer: Self-pay | Admitting: Internal Medicine

## 2022-11-08 DIAGNOSIS — J9601 Acute respiratory failure with hypoxia: Secondary | ICD-10-CM | POA: Diagnosis present

## 2022-11-08 DIAGNOSIS — I480 Paroxysmal atrial fibrillation: Secondary | ICD-10-CM | POA: Diagnosis present

## 2022-11-08 DIAGNOSIS — I5033 Acute on chronic diastolic (congestive) heart failure: Secondary | ICD-10-CM

## 2022-11-08 DIAGNOSIS — I5043 Acute on chronic combined systolic (congestive) and diastolic (congestive) heart failure: Secondary | ICD-10-CM | POA: Diagnosis not present

## 2022-11-08 DIAGNOSIS — R339 Retention of urine, unspecified: Secondary | ICD-10-CM

## 2022-11-08 DIAGNOSIS — Z9889 Other specified postprocedural states: Secondary | ICD-10-CM

## 2022-11-08 DIAGNOSIS — I252 Old myocardial infarction: Secondary | ICD-10-CM | POA: Diagnosis not present

## 2022-11-08 DIAGNOSIS — K219 Gastro-esophageal reflux disease without esophagitis: Secondary | ICD-10-CM | POA: Diagnosis present

## 2022-11-08 DIAGNOSIS — Z95810 Presence of automatic (implantable) cardiac defibrillator: Secondary | ICD-10-CM | POA: Diagnosis not present

## 2022-11-08 DIAGNOSIS — N179 Acute kidney failure, unspecified: Secondary | ICD-10-CM | POA: Diagnosis not present

## 2022-11-08 DIAGNOSIS — I509 Heart failure, unspecified: Secondary | ICD-10-CM | POA: Diagnosis not present

## 2022-11-08 DIAGNOSIS — Z6831 Body mass index (BMI) 31.0-31.9, adult: Secondary | ICD-10-CM | POA: Diagnosis not present

## 2022-11-08 DIAGNOSIS — E785 Hyperlipidemia, unspecified: Secondary | ICD-10-CM | POA: Diagnosis present

## 2022-11-08 DIAGNOSIS — N1831 Chronic kidney disease, stage 3a: Secondary | ICD-10-CM | POA: Diagnosis present

## 2022-11-08 DIAGNOSIS — N401 Enlarged prostate with lower urinary tract symptoms: Secondary | ICD-10-CM | POA: Diagnosis present

## 2022-11-08 DIAGNOSIS — G4730 Sleep apnea, unspecified: Secondary | ICD-10-CM | POA: Diagnosis present

## 2022-11-08 DIAGNOSIS — E669 Obesity, unspecified: Secondary | ICD-10-CM

## 2022-11-08 DIAGNOSIS — Z8249 Family history of ischemic heart disease and other diseases of the circulatory system: Secondary | ICD-10-CM | POA: Diagnosis not present

## 2022-11-08 DIAGNOSIS — Z87891 Personal history of nicotine dependence: Secondary | ICD-10-CM | POA: Diagnosis not present

## 2022-11-08 DIAGNOSIS — Z9861 Coronary angioplasty status: Secondary | ICD-10-CM | POA: Diagnosis not present

## 2022-11-08 DIAGNOSIS — Z951 Presence of aortocoronary bypass graft: Secondary | ICD-10-CM | POA: Diagnosis not present

## 2022-11-08 DIAGNOSIS — Z608 Other problems related to social environment: Secondary | ICD-10-CM | POA: Diagnosis present

## 2022-11-08 DIAGNOSIS — I10 Essential (primary) hypertension: Secondary | ICD-10-CM

## 2022-11-08 DIAGNOSIS — E876 Hypokalemia: Secondary | ICD-10-CM | POA: Diagnosis present

## 2022-11-08 DIAGNOSIS — R0602 Shortness of breath: Secondary | ICD-10-CM | POA: Diagnosis present

## 2022-11-08 DIAGNOSIS — Z8673 Personal history of transient ischemic attack (TIA), and cerebral infarction without residual deficits: Secondary | ICD-10-CM | POA: Diagnosis not present

## 2022-11-08 DIAGNOSIS — M109 Gout, unspecified: Secondary | ICD-10-CM | POA: Diagnosis present

## 2022-11-08 DIAGNOSIS — I130 Hypertensive heart and chronic kidney disease with heart failure and stage 1 through stage 4 chronic kidney disease, or unspecified chronic kidney disease: Secondary | ICD-10-CM | POA: Diagnosis present

## 2022-11-08 DIAGNOSIS — R338 Other retention of urine: Secondary | ICD-10-CM | POA: Diagnosis not present

## 2022-11-08 LAB — TROPONIN I (HIGH SENSITIVITY)
Troponin I (High Sensitivity): 21 ng/L — ABNORMAL HIGH (ref <18)
Troponin I (High Sensitivity): 20 ng/L — ABNORMAL HIGH (ref <18)

## 2022-11-08 LAB — COMPREHENSIVE METABOLIC PANEL
ALT: 16 U/L (ref 0–44)
AST: 17 U/L (ref 15–41)
Albumin: 3.2 g/dL — ABNORMAL LOW (ref 3.5–5.0)
Alkaline Phosphatase: 89 U/L (ref 38–126)
Anion gap: 9 (ref 5–15)
BUN: 23 mg/dL (ref 8–23)
CO2: 23 mmol/L (ref 22–32)
Calcium: 9.2 mg/dL (ref 8.9–10.3)
Chloride: 113 mmol/L — ABNORMAL HIGH (ref 98–111)
Creatinine, Ser: 1.36 mg/dL — ABNORMAL HIGH (ref 0.61–1.24)
GFR, Estimated: 54 mL/min — ABNORMAL LOW (ref >60)
Glucose, Bld: 104 mg/dL — ABNORMAL HIGH (ref 70–99)
Potassium: 3.6 mmol/L (ref 3.5–5.1)
Sodium: 145 mmol/L (ref 135–145)
Total Bilirubin: 0.9 mg/dL (ref 0.3–1.2)
Total Protein: 5.7 g/dL — ABNORMAL LOW (ref 6.5–8.1)

## 2022-11-08 LAB — BRAIN NATRIURETIC PEPTIDE: B Natriuretic Peptide: 736.8 pg/mL — ABNORMAL HIGH (ref 0.0–100.0)

## 2022-11-08 MED ORDER — FUROSEMIDE 10 MG/ML IJ SOLN
40.0000 mg | Freq: Once | INTRAMUSCULAR | Status: AC
  Administered 2022-11-08: 40 mg via INTRAVENOUS
  Filled 2022-11-08: qty 4

## 2022-11-08 MED ORDER — CLOPIDOGREL BISULFATE 75 MG PO TABS
75.0000 mg | ORAL_TABLET | Freq: Every day | ORAL | Status: DC
  Administered 2022-11-08 – 2022-11-15 (×8): 75 mg via ORAL
  Filled 2022-11-08 (×8): qty 1

## 2022-11-08 MED ORDER — SODIUM CHLORIDE 0.9% FLUSH: 3.0000 mL | INTRAVENOUS | Status: DC | PRN

## 2022-11-08 MED ORDER — TRAZODONE HCL 50 MG PO TABS
25.0000 mg | ORAL_TABLET | Freq: Every evening | ORAL | Status: DC | PRN
  Administered 2022-11-08 – 2022-11-12 (×5): 25 mg via ORAL
  Filled 2022-11-08 (×5): qty 1

## 2022-11-08 MED ORDER — SODIUM CHLORIDE 0.9% FLUSH
3.0000 mL | Freq: Two times a day (BID) | INTRAVENOUS | Status: DC
  Administered 2022-11-08 – 2022-11-14 (×14): 3 mL via INTRAVENOUS

## 2022-11-08 MED ORDER — EDOXABAN TOSYLATE 30 MG PO TABS
60.0000 mg | ORAL_TABLET | ORAL | Status: DC
  Administered 2022-11-08 – 2022-11-10 (×3): 60 mg via ORAL
  Filled 2022-11-08 (×4): qty 2

## 2022-11-08 MED ORDER — ESCITALOPRAM OXALATE 10 MG PO TABS
20.0000 mg | ORAL_TABLET | Freq: Every day | ORAL | Status: DC
  Administered 2022-11-08 – 2022-11-14 (×7): 20 mg via ORAL
  Filled 2022-11-08 (×7): qty 2

## 2022-11-08 MED ORDER — AMLODIPINE BESYLATE 10 MG PO TABS
10.0000 mg | ORAL_TABLET | Freq: Every day | ORAL | Status: DC
  Administered 2022-11-08 – 2022-11-15 (×8): 10 mg via ORAL
  Filled 2022-11-08 (×5): qty 1
  Filled 2022-11-08: qty 2
  Filled 2022-11-08 (×2): qty 1

## 2022-11-08 MED ORDER — DOCUSATE SODIUM 100 MG PO CAPS
200.0000 mg | ORAL_CAPSULE | Freq: Every day | ORAL | Status: DC
  Filled 2022-11-08 (×5): qty 2

## 2022-11-08 MED ORDER — FUROSEMIDE 10 MG/ML IJ SOLN
60.0000 mg | Freq: Two times a day (BID) | INTRAMUSCULAR | Status: DC
  Administered 2022-11-08 – 2022-11-10 (×4): 60 mg via INTRAVENOUS
  Filled 2022-11-08 (×4): qty 6

## 2022-11-08 MED ORDER — HYDRALAZINE HCL 50 MG PO TABS
50.0000 mg | ORAL_TABLET | Freq: Three times a day (TID) | ORAL | Status: DC
  Administered 2022-11-08 – 2022-11-15 (×21): 50 mg via ORAL
  Filled 2022-11-08 (×6): qty 1
  Filled 2022-11-08: qty 2
  Filled 2022-11-08 (×14): qty 1

## 2022-11-08 MED ORDER — FUROSEMIDE 10 MG/ML IJ SOLN
40.0000 mg | Freq: Two times a day (BID) | INTRAMUSCULAR | Status: DC
  Administered 2022-11-08: 40 mg via INTRAVENOUS
  Filled 2022-11-08: qty 4

## 2022-11-08 MED ORDER — CALCITRIOL 0.25 MCG PO CAPS
0.2500 ug | ORAL_CAPSULE | Freq: Every day | ORAL | Status: DC
  Administered 2022-11-08 – 2022-11-15 (×8): 0.25 ug via ORAL
  Filled 2022-11-08 (×8): qty 1

## 2022-11-08 MED ORDER — SODIUM CHLORIDE 0.9 % IV SOLN: 250.0000 mL | INTRAVENOUS | Status: DC | PRN

## 2022-11-08 MED ORDER — ATORVASTATIN CALCIUM 80 MG PO TABS
80.0000 mg | ORAL_TABLET | Freq: Every day | ORAL | Status: DC
  Administered 2022-11-08 – 2022-11-14 (×7): 80 mg via ORAL
  Filled 2022-11-08 (×7): qty 1

## 2022-11-08 MED ORDER — ONDANSETRON HCL 4 MG/2ML IJ SOLN: 4.0000 mg | Freq: Four times a day (QID) | INTRAMUSCULAR | Status: DC | PRN

## 2022-11-08 MED ORDER — PRIMIDONE 50 MG PO TABS
100.0000 mg | ORAL_TABLET | Freq: Every day | ORAL | Status: DC
  Administered 2022-11-08 – 2022-11-14 (×7): 100 mg via ORAL
  Filled 2022-11-08 (×8): qty 2

## 2022-11-08 MED ORDER — ACETAMINOPHEN 325 MG PO TABS: 650.0000 mg | ORAL_TABLET | ORAL | Status: DC | PRN

## 2022-11-08 MED ORDER — PANTOPRAZOLE SODIUM 40 MG PO TBEC
40.0000 mg | DELAYED_RELEASE_TABLET | Freq: Every day | ORAL | Status: DC
  Administered 2022-11-09 – 2022-11-15 (×7): 40 mg via ORAL
  Filled 2022-11-08 (×7): qty 1

## 2022-11-08 MED ORDER — VITAMIN D (ERGOCALCIFEROL) 1.25 MG (50000 UNIT) PO CAPS
50000.0000 [IU] | ORAL_CAPSULE | ORAL | Status: DC
  Administered 2022-11-14: 50000 [IU] via ORAL
  Filled 2022-11-08: qty 1

## 2022-11-08 MED ORDER — FEBUXOSTAT 40 MG PO TABS
80.0000 mg | ORAL_TABLET | Freq: Every day | ORAL | Status: DC
  Administered 2022-11-08 – 2022-11-15 (×8): 80 mg via ORAL
  Filled 2022-11-08 (×8): qty 2

## 2022-11-08 MED ORDER — CARVEDILOL 12.5 MG PO TABS
12.5000 mg | ORAL_TABLET | Freq: Two times a day (BID) | ORAL | Status: DC
  Administered 2022-11-08 – 2022-11-15 (×14): 12.5 mg via ORAL
  Filled 2022-11-08 (×14): qty 1

## 2022-11-08 NOTE — Assessment & Plan Note (Addendum)
Continue blood pressure control with amlodipine, hydralazine and carvedilol Continue diuresis with IV furosemide.

## 2022-11-08 NOTE — Progress Notes (Signed)
Progress Note   Patient: Alexander Austin EXB:[id number] DOB: Dec 22, 1945 DOA: 11/07/2022     0 DOS: the patient was seen and examined on 11/08/2022   Brief hospital course: Mr. Bisono was admitted to the hospital with the working diagnosis of decompensated heart failure.   76 yo male with the past medical history of paroxysmal atrial fibrillation, hypertension, CVA, and obesity who presented with dyspnea. Reported 2 days of dyspnea, with orthopnea and PND. Positive lower extremity edema. Symptoms that prompted him to come to the ED. On his initial physical examination his blood pressure was 154/63, HR 53, RR 26, 02 saturation 94% on 5 L/min per [place]. Lungs had no wheezing or rales, heart with S1 and S2 present and regular, abdomen with no distention, positive lower extremity edema.   Na 145, K 3,6 CL 113 bicarbonate 232, glucose 104 bun 23 cr 1,36  BNP 736 High sensitive troponin 21 and 20  Wbc 4,3 hgb 11,0 plt 171   Chest radiograph with cardiomegaly, bilateral hilar vascular congestion, with no effusion, cephalization of the vasculature. Pacemaker defibrillator in place with one atrial and one ventricular lead, sternotomy wires in place.   EKG 65 bpm, left axis deviation, left bundle morphology, atrial fibrillation with ventricular paced rhythm, qtc 511, with no significant ST segment or T wave changes.     Assessment and Plan: * Acute on chronic diastolic CHF (congestive heart failure) (HCC) Echocardiogram with preserved LV systolic function with EF 50 to 10%, RV systolic function preserved, LA with severe dilatation,  Plan to continue diuresis with furosemide to target negative fluid balance.   Resume carvedilol for B blockade and amlodipine for blood pressure control Possible addition of RAS inhibition depending on blood pressure and renal function.   Essential hypertension, benign Continue blood pressure control with amlodipine, hydralazine and carvedilol Continue diuresis with IV  furosemide.   PAF (paroxysmal atrial fibrillation) (HCC) Rate control with carvedilol and anticoagulation with apixaban Continue telemetry monitoring   Chronic kidney disease, stage 3a ([place]) Renal function with serum cr at 1.36 with K at 3,6 and serum bicarbonate at 23.  Patient continue with hypervolemia, continue diuresis with furosemide, follow up renal function and electrolytes.   Status post carotid endarterectomy History of CVA with limited mobility, he uses a walker for ambulation.  CEA last month Continue with clopidogrel and statin therapy  Continue with edoxaban.   Obesity (BMI 30.0-34.9) Calculated BMI 31.9 consistent with obesity class 1  Urinary retention Continue foley care.         Subjective: Patient with improved dyspnea but not back to baseline, his mobility is limited due to recent CVA   Physical Exam: Vitals:   11/08/22 1030 11/08/22 1036 11/08/22 1100 11/08/22 1200  BP: (!) 171/88  (!) 156/75 (!) 169/73  Pulse: 60 (!) 59 61 60  Resp: (!) 27 19 (!) 21 (!) 23  Temp:      TempSrc:      SpO2: 91% 98% 97% 96%  Weight:      Height:       Neurology awake and alert ENT with mild pallor Cardiovascular with S1 and S2 present and rhythmic with no gallops, rubs or murmurs Respiratory with bilateral rales with no wheezing Abdomen with no distention  Positive lower extremity edema + Data Reviewed:    Family Communication: no family at the bedside   Disposition: Status is: Observation The patient will require care spanning > 2 midnights and should be moved to inpatient because: IV  diuresis   Planned Discharge Destination: Home      Author: Tawni Millers, MD 11/08/2022 3:09 PM  For on call review [email or web address].

## 2022-11-08 NOTE — Assessment & Plan Note (Addendum)
Rate control with carvedilol and anticoagulation with apixaban Continue telemetry monitoring

## 2022-11-08 NOTE — Assessment & Plan Note (Deleted)
EF 50-55%, s/p AICD placement, grade 2 DD. CHF pathway Tele monitor Lasix '40mg'$  IV BID Strict intake and output Daily BMPs

## 2022-11-08 NOTE — Hospital Course (Signed)
Mr. Christiano was admitted to the hospital with the working diagnosis of decompensated heart failure.   76 yo male with the past medical history of paroxysmal atrial fibrillation, hypertension, CVA, and obesity who presented with dyspnea. Reported 2 days of dyspnea, with orthopnea and PND. Positive lower extremity edema. Symptoms that prompted him to come to the ED. On his initial physical examination his blood pressure was 154/63, HR 53, RR 26, 02 saturation 94% on 5 L/min per [place]. Lungs had no wheezing or rales, heart with S1 and S2 present and regular, abdomen with no distention, positive lower extremity edema.   Na 145, K 3,6 CL 113 bicarbonate 232, glucose 104 bun 23 cr 1,36  BNP 736 High sensitive troponin 21 and 20  Wbc 4,3 hgb 11,0 plt 171   Chest radiograph with cardiomegaly, bilateral hilar vascular congestion, with no effusion, cephalization of the vasculature. Pacemaker defibrillator in place with one atrial and one ventricular lead, sternotomy wires in place.   EKG 65 bpm, left axis deviation, left bundle morphology, atrial fibrillation with ventricular paced rhythm, qtc 511, with no significant ST segment or T wave changes.   Patient has been placed on IV furosemide for diuresis.

## 2022-11-08 NOTE — Assessment & Plan Note (Signed)
No foley catheter.

## 2022-11-08 NOTE — H&P (Signed)
History and Physical    Patient: Alexander Austin IFO:[id number] DOB: Sep 05, 1946 DOA: 11/07/2022 DOS: the patient was seen and examined on 11/08/2022 PCP: Windy Fast, MD  Patient coming from: Home  Chief Complaint:  Chief Complaint  Patient presents with   Shortness of Breath   HPI: Alexander Austin is a 76 y.o. male with medical history significant of PAF on chronic AC, HTN, Stroke with CEA just last month.  HFrEF s/p AICD placement, improvement in EF to 50-55% as of echo in Sept, Grade 2 DD.  Pt presents to ED with c/o SOB.  Onset and worsening of SOB over past 2 days, worse when laying flat.  Chest pain earlier this evening that improved after nitroglycerin with EMS.  Does not use oxygen at home, currently requiring 5 L nasal cannula.  Has noticed increased swelling in the legs as well.   Review of Systems: As mentioned in the history of present illness. All other systems reviewed and are negative. Past Medical History:  Diagnosis Date   A-fib [facility])    Anxiety    Benign prostate hyperplasia    Cancer ([place])    Kidney   Chronic kidney disease    Congestive heart failure (CHF) (HCC)    CVA (cerebral infarction)    Depression    Dyspnea    GERD (gastroesophageal reflux disease)    Gout    Hematospermia 01/13/2012   History of myocardial infarction 1993   Hyperlipidemia    Hypertension    Sleep apnea    Stroke ([place])    Vitamin B 12 deficiency    Weakness of left leg 03/28/2020   Past Surgical History:  Procedure Laterality Date   ANGIOPLASTY     X 8    BACK SURGERY     CHOLECYSTECTOMY     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   ENDARTERECTOMY Left 09/20/2022   Procedure: ENDARTERECTOMY CAROTID WITH 1 CM X 6CM BIOLOGIC PATCH.;  Surgeon: Angelia Mould, MD;  Location: [place];  Service: Vascular;  Laterality: Left;   [place]  02/05/15   Social History:  reports that he quit smoking about 40 years ago. He has never used smokeless tobacco. He  reports that he does not drink alcohol and does not use drugs.  Allergies  Allergen Reactions   Isosorbide Nitrate Anaphylaxis    Other reaction(s): Cardiovascular Arrest (ALLERGY/intolerance) Can take Sublingual Nitro.    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    Family History  Problem Relation Age of Onset   Hypertension Mother    Heart disease Mother    Stroke Mother    Hypertension Father     Prior to Admission medications   Medication Sig Start Date End Date Taking? Authorizing Provider  edoxaban (SAVAYSA) 60 MG TABS tablet TAKE ONE TABLET BY MOUTH ONCE A DAY (CAUTION: BLOOD THINNER)(APPROVED) 10/28/22  Yes [provider]  potassium chloride (KLOR-CON) 10 MEQ tablet Take 10 mEq by mouth daily. 09/29/22  Yes [provider]  albuterol (VENTOLIN HFA) 108 (90 Base) MCG/ACT inhaler Inhale 2 puffs into the lungs daily as needed for wheezing or shortness of breath. 07/26/22   [provider]  amLODipine (NORVASC) 10 MG tablet Take 10 mg by mouth daily.    [provider]  atorvastatin (LIPITOR) 80 MG tablet Take 80 mg by mouth daily.     [provider]  calcitRIOL (ROCALTROL) 0.25 MCG capsule Take  0.25 mcg by mouth daily.     [provider]  carbamide peroxide (DEBROX) 6.5 % OTIC solution Place 5-10 drops into both ears 2 (two) times daily. 02/02/22   [provider]  carvedilol (COREG) 12.5 MG tablet Take 1 tablet (12.5 mg total) by mouth 2 (two) times daily with a meal. 09/23/22   Dessa Phi, DO  clopidogrel (PLAVIX) 75 MG tablet Take 75 mg by mouth daily. 09/30/20   [provider]  colchicine 0.6 MG tablet Take 0.5 tablets (0.3 mg total) by mouth 2 (two) times daily. 07/07/20   Danford, Suann Larry, MD  Docusate Sodium (DSS) 100 MG CAPS Take 100 mg by mouth daily. 02/02/22   [provider]  escitalopram (LEXAPRO) 20 MG tablet Take 20 mg by  mouth at bedtime. 02/25/21   [provider]  Febuxostat 80 MG TABS Take 80 mg by mouth daily. 10/21/20   [provider]  furosemide (LASIX) 40 MG tablet Take 1 tablet (40 mg total) by mouth daily. Patient taking differently: Take 40 mg by mouth 2 (two) times daily. 04/05/20   Florencia Reasons, MD  pantoprazole (PROTONIX) 40 MG tablet Take 40 mg by mouth daily.     [provider]  potassium chloride (KLOR-CON M) 10 MEQ tablet Take 10 mEq by mouth daily.    [provider]  primidone (MYSOLINE) 50 MG tablet Take 100 mg by mouth in the morning and at bedtime.     [provider]  traZODone (DESYREL) 50 MG tablet Take 25 mg by mouth at bedtime as needed for sleep. 07/02/22   [provider]  Vitamin D, Ergocalciferol, (DRISDOL) 1.25 MG (50000 UNIT) CAPS capsule Take 50,000 Units by mouth every 7 (seven) days. Sunday    [provider]  warfarin (COUMADIN) 2.5 MG tablet Take 1 tablet (2.5 mg total) by mouth daily at 4 PM. 09/23/22   Dessa Phi, DO    Physical Exam: Vitals:   11/07/22 2327 11/07/22 2345 11/08/22 0000 11/08/22 0015  BP:  (!) 154/63 (!) 153/60 (!) 164/63  Pulse:  (!) 53 (!) 52 (!) 50  Resp:  (!) 25 (!) 26 (!) 22  SpO2:  94% 94% 95%  Weight: 109.8 kg     Height: '6\' 1"'$  (1.854 m)      Constitutional: NAD, calm, comfortable Eyes: PERRL, lids and conjunctivae normal ENMT: Mucous membranes are moist. Posterior pharynx clear of any exudate or lesions.Normal dentition.  Neck: normal, supple, no masses, no thyromegaly Respiratory: clear to auscultation bilaterally, no wheezing, no crackles. Normal respiratory effort. No accessory muscle use.  Cardiovascular: Regular rate and rhythm, no murmurs / rubs / gallops. Trace BLE edema. 2+ pedal pulses. No carotid bruits.  Abdomen: no tenderness, no masses palpated. No hepatosplenomegaly. Bowel sounds positive.  Musculoskeletal: no clubbing / cyanosis. No joint deformity upper and lower  extremities. Good ROM, no contractures. Normal muscle tone.  Skin: no rashes, lesions, ulcers. No induration Neurologic: CN 2-12 grossly intact. Sensation intact, DTR normal. Strength 5/5 in all 4.  Psychiatric: Normal judgment and insight. Alert and oriented x 3. Normal mood.   Data Reviewed:     BNP 736     Latest Ref Rng & Units 11/07/2022   11:28 PM 09/29/2022    6:31 AM 09/28/2022    3:21 AM  CMP  Glucose 70 - 99 mg/dL 104  89  111   BUN 8 - 23 mg/dL 23  25  32   Creatinine 0.61 -  1.24 mg/dL 1.36  1.58  1.71   Sodium 135 - 145 mmol/L 145  139  139   Potassium 3.5 - 5.1 mmol/L 3.6  4.0  3.8   Chloride 98 - 111 mmol/L 113  111  107   CO2 22 - 32 mmol/L '23  22  22   '$ Calcium 8.9 - 10.3 mg/dL 9.2  8.7  8.6   Total Protein 6.5 - 8.1 g/dL 5.7     Total Bilirubin 0.3 - 1.2 mg/dL 0.9     Alkaline Phos 38 - 126 U/L 89     AST 15 - 41 U/L 17     ALT 0 - 44 U/L 16      CXR: IMPRESSION: 1. Minimal opacities may represent atelectasis or infection. 2. Stable cardiomegaly.  EKG = ventricular paced rhythm.  Assessment and Plan: * Acute on chronic combined systolic and diastolic CHF (congestive heart failure) (HCC) EF 50-55%, s/p AICD placement, grade 2 DD. CHF pathway Tele monitor Lasix '40mg'$  IV BID Strict intake and output Daily BMPs  Chronic kidney disease, stage 3a (HCC) Creat today appears to be his baseline. Monitor daily BMPs with diuresis.  Status post carotid endarterectomy CEA last month Continue plavix + edoxaban  Essential hypertension, benign Cont home BP meds (amlodipine and coreg)  PAF (paroxysmal atrial fibrillation) (HCC) Cont home coreg Cont edoxaban (on for PAF + recent CEA)      Advance Care Planning:   Code Status: Partial Code  Consults: None  Family Communication: No family in room  Severity of Illness: The appropriate patient status for this patient is OBSERVATION. Observation status is judged to be reasonable and necessary in order to  provide the required intensity of service to ensure the patient's safety. The patient's presenting symptoms, physical exam findings, and initial radiographic and laboratory data in the context of their medical condition is felt to place them at decreased risk for further clinical deterioration. Furthermore, it is anticipated that the patient will be medically stable for discharge from the hospital within 2 midnights of admission.   Author: Etta Quill., DO 11/08/2022 12:48 AM  For on call review [email or web address].

## 2022-11-08 NOTE — Assessment & Plan Note (Addendum)
History of CVA with limited mobility, he uses a walker for ambulation.  CEA last month Continue with clopidogrel and statin therapy  Continue with edoxaban.  Follow up with PT and OT

## 2022-11-08 NOTE — Assessment & Plan Note (Signed)
Calculated BMI 31.9 consistent with obesity class 1

## 2022-11-08 NOTE — ED Notes (Signed)
Patient denies pain and is resting comfortably.  

## 2022-11-08 NOTE — Assessment & Plan Note (Addendum)
Hypokalemia. Hypomagnesemia   Baseline serum cr 1,5 to 1,6 Patient with improvement in volume status but not yet back to baseline. Renal function with serum cr at 1,59 with K at 2,9 and serum bicarbonate at 25. Mg 1,8  Add Kcl 40 meq bid and follow up renal function and electrolytes in am.  Add 2 g Mag sulfate.

## 2022-11-08 NOTE — Assessment & Plan Note (Signed)
Echocardiogram with preserved LV systolic function with EF 50 to 78%, RV systolic function preserved, LA with severe dilatation,  Urine output is 4128 ml Systolic blood pressure 208 to 160 mmHg.   Resume carvedilol for B blockade and amlodipine for blood pressure control Continue to hold on RAS inhibition due to low GFR.

## 2022-11-09 ENCOUNTER — Encounter (HOSPITAL_COMMUNITY): Payer: Self-pay | Admitting: Vascular Surgery

## 2022-11-09 ENCOUNTER — Other Ambulatory Visit (HOSPITAL_COMMUNITY): Payer: Self-pay

## 2022-11-09 DIAGNOSIS — I10 Essential (primary) hypertension: Secondary | ICD-10-CM | POA: Diagnosis not present

## 2022-11-09 DIAGNOSIS — I480 Paroxysmal atrial fibrillation: Secondary | ICD-10-CM | POA: Diagnosis not present

## 2022-11-09 DIAGNOSIS — N1831 Chronic kidney disease, stage 3a: Secondary | ICD-10-CM | POA: Diagnosis not present

## 2022-11-09 DIAGNOSIS — I5033 Acute on chronic diastolic (congestive) heart failure: Secondary | ICD-10-CM

## 2022-11-09 DIAGNOSIS — R339 Retention of urine, unspecified: Secondary | ICD-10-CM

## 2022-11-09 DIAGNOSIS — E669 Obesity, unspecified: Secondary | ICD-10-CM

## 2022-11-09 LAB — BASIC METABOLIC PANEL
Anion gap: 12 (ref 5–15)
BUN: 24 mg/dL — ABNORMAL HIGH (ref 8–23)
CO2: 25 mmol/L (ref 22–32)
Calcium: 8.4 mg/dL — ABNORMAL LOW (ref 8.9–10.3)
Chloride: 108 mmol/L (ref 98–111)
Creatinine, Ser: 1.59 mg/dL — ABNORMAL HIGH (ref 0.61–1.24)
GFR, Estimated: 44 mL/min — ABNORMAL LOW (ref >60)
Glucose, Bld: 122 mg/dL — ABNORMAL HIGH (ref 70–99)
Potassium: 2.9 mmol/L — ABNORMAL LOW (ref 3.5–5.1)
Sodium: 145 mmol/L (ref 135–145)

## 2022-11-09 LAB — MAGNESIUM: Magnesium: 1.8 mg/dL (ref 1.7–2.4)

## 2022-11-09 MED ORDER — MAGNESIUM SULFATE 2 GM/50ML IV SOLN
2.0000 g | Freq: Once | INTRAVENOUS | Status: AC
  Administered 2022-11-09: 2 g via INTRAVENOUS
  Filled 2022-11-09: qty 50

## 2022-11-09 MED ORDER — POTASSIUM CHLORIDE CRYS ER 20 MEQ PO TBCR
40.0000 meq | EXTENDED_RELEASE_TABLET | ORAL | Status: AC
  Administered 2022-11-09 (×2): 40 meq via ORAL
  Filled 2022-11-09 (×2): qty 2

## 2022-11-09 NOTE — Evaluation (Signed)
Physical Therapy Evaluation Patient Details Name: Alexander Austin MRN: [id number] DOB: September 29, 1946 Today's Date: 11/09/2022  History of Present Illness  Pt is a 76 y.o. male admitted 11/07/22 with dyspnea. Workup for acute on chronic CHF. Of note, recent admission 08/2022 with age-indeterminate L frontal infarct. Other PMH includes CHF, CAD s/p CABG, pacemaker, afib, pulmonary HTN, CKD 4, renal cell CA s/p nephrectomy.   Clinical Impression  Pt presents with an overall decrease in functional mobility secondary to above. PTA, pt mod indep ambulating with intermittent use of RW, lives below landlord, has aide assist 5x/wk for iADLs and bathing assist; pt has been working with [facility] since recent admission 08/2022. Today, pt able to transfer and ambulate in room with RW, progressing to supervision-level for mobility and ADL tasks. Pt would benefit from continued acute PT services to maximize functional mobility and independence prior to d/c with continued HHPT.      SpO2 down to 86% on RA; quick increase to SpO2 >/92% on RA with seated rest HR 50s-70s   Recommendations for follow up therapy are one component of a multi-disciplinary discharge planning process, led by the attending physician.  Recommendations may be updated based on patient status, additional functional criteria and insurance authorization.  Follow Up Recommendations Home health PT      Assistance Recommended at Discharge PRN  Patient can return home with the following  A little help with bathing/dressing/bathroom;Assist for transportation;Assistance with cooking/housework    Equipment Recommendations None recommended by PT  Recommendations for Other Services       Functional Status Assessment Patient has had a recent decline in their functional status and demonstrates the ability to make significant improvements in function in a reasonable and predictable amount of time.     Precautions / Restrictions Precautions Precautions:  Fall Restrictions Weight Bearing Restrictions: No      Mobility  Bed Mobility Overal bed mobility: Needs Assistance Bed Mobility: Supine to Sit, Sit to Supine     Supine to sit: Min assist, HOB elevated Sit to supine: Supervision   General bed mobility comments: minA for HHA to power up to sitting; pt reports he sleeps in recliner at home    Transfers Overall transfer level: Needs assistance Equipment used: Rolling walker (2 wheels) Transfers: Sit to/from Stand Sit to Stand: Min guard, Supervision           General transfer comment: pt requesting RW use. initial min guard for balance standing from EOB to RW; sit<>stand from low toilet height with supervision, use of grab bar    Ambulation/Gait Ambulation/Gait assistance: Supervision Gait Distance (Feet): 40 Feet Assistive device: Rolling walker (2 wheels) Gait Pattern/deviations: Step-through pattern, Decreased stride length, Trunk flexed Gait velocity: Decreased     General Gait Details: slow, fatigued, but steady gait with RW and supervision for safety/lines; noted DOE 3/4 requiring seated rest to recover at EOB before laying back down at end of session  Stairs            Wheelchair Mobility    Modified Rankin (Stroke Patients Only)       Balance Overall balance assessment: Needs assistance   Sitting balance-Leahy Scale: Good Sitting balance - Comments: able to don/doff slippers sitting EOB   Standing balance support: No upper extremity supported, During functional activity Standing balance-Leahy Scale: Fair Standing balance comment: can static stand without UE support, preference for walker; able to perform pericare and turn around to flush toilet  Pertinent Vitals/Pain Pain Assessment Pain Assessment: No/denies pain    Home Living Family/patient expects to be discharged to:: Private residence Living Arrangements: Alone Available Help at Discharge:  Neighbor;Personal care attendant;Available PRN/intermittently Type of Home: House Home Access: Level entry     Alternate Level Stairs-Number of Steps: flight -has a stair lift. says he only goes upstairs if landlord makes dinner (lives above him) Home Layout: Two level;Able to live on main level with bedroom/bathroom Home Equipment: Rollator (4 wheels);Rolling Walker (2 wheels);Shower seat - built in;Cane - Immunologist      Prior Function Prior Level of Function : Needs assist             Mobility Comments: Mod indep ambulating with walker "as needed." sleeps in recliner. currently working with [facility] since recent admission 08/2022 ADLs Comments: Has aide 5x/wk who assists with housework, meals, bathing, other iADLs.     Hand Dominance        Extremity/Trunk Assessment   Upper Extremity Assessment Upper Extremity Assessment: Overall WFL for tasks assessed    Lower Extremity Assessment Lower Extremity Assessment: Overall WFL for tasks assessed    Cervical / Trunk Assessment Cervical / Trunk Assessment: Kyphotic  Communication   Communication: HOH  Cognition Arousal/Alertness: Awake/alert Behavior During Therapy: WFL for tasks assessed/performed Overall Cognitive Status: Within Functional Limits for tasks assessed                                 General Comments: WFL for simple tasks, especially for having just woken up; not formally assessed        General Comments General comments (skin integrity, edema, etc.): HR 56-70 during session; SpO2 down to 86% on RA with recovery >/92% with seated rest; 1L O2 [place] replaced at end of session (pt does not wear baseline). discussed energy conservation strategies with ADL tasks and current SpO2 values    Exercises     Assessment/Plan    PT Assessment Patient needs continued PT services  PT Problem List Decreased activity tolerance;Decreased balance;Decreased mobility;Cardiopulmonary status limiting  activity       PT Treatment Interventions DME instruction;Gait training;Functional mobility training;Therapeutic exercise;Therapeutic activities;Balance training;Patient/family education    PT Goals (Current goals can be found in the Care Plan section)  Acute Rehab PT Goals Patient Stated Goal: return home, willing to keep working with HHPT PT Goal Formulation: With patient Time For Goal Achievement: 11/23/22 Potential to Achieve Goals: Good    Frequency Min 3X/week     Co-evaluation               AM-PAC PT "6 Clicks" Mobility  Outcome Measure Help needed turning from your back to your side while in a flat bed without using bedrails?: A Little Help needed moving from lying on your back to sitting on the side of a flat bed without using bedrails?: A Little Help needed moving to and from a bed to a chair (including a wheelchair)?: A Little Help needed standing up from a chair using your arms (e.g., wheelchair or bedside chair)?: A Little Help needed to walk in hospital room?: A Little Help needed climbing 3-5 steps with a railing? : A Little 6 Click Score: 18    End of Session Equipment Utilized During Treatment: Gait belt Activity Tolerance: Patient tolerated treatment well Patient left: in bed;with call bell/phone within reach;with bed alarm set Nurse Communication: Mobility status PT Visit Diagnosis: Other abnormalities of  gait and mobility (R26.89)    Time: 7998-0012 PT Time Calculation (min) (ACUTE ONLY): 32 min   Charges:   PT Evaluation $PT Eval Moderate Complexity: 1 Mod PT Treatments $Therapeutic Activity: 8-22 mins       Mabeline Caras, PT, DPT Acute Rehabilitation Services  Personal: [facility] Office: [place] 11/09/2022, 8:33 AM

## 2022-11-09 NOTE — Progress Notes (Signed)
Heart Failure Nurse Navigator Progress Note  PCP: Windy Fast, MD PCP-Cardiologist: [facility] [place] Admission Diagnosis: Acute hypoxic respiratory failure, Acute on chronic diastolic heart failure.  Admitted from: Home  Presentation:   Alexander Austin presented with shortness of breath, orthopnea, edema. BNP 736, Troponin 21, CXR with cardiomegally, bilateral vascular congestion. IV diuretics started.   Patient educated on the sign and symptoms of heart failure, daily weights, diet/ fluid restrictions, taking all medications as prescribed, attending all medical appointments. Patient verbalized his understanding, a hospital HF TOC appointment was scheduled for 11/23/22 @ 3 pm.   ECHO/ LVEF: 50-55% G2DD  Clinical Course:  Past Medical History:  Diagnosis Date   A-fib [facility])    Anxiety    Benign prostate hyperplasia    Cancer (HCC)    Kidney   Chronic kidney disease    Congestive heart failure (CHF) (HCC)    CVA (cerebral infarction)    Depression    Dyspnea    GERD (gastroesophageal reflux disease)    Gout    Hematospermia 01/13/2012   History of myocardial infarction 1993   Hyperlipidemia    Hypertension    Sleep apnea    Stroke ([place])    Vitamin B 12 deficiency    Weakness of left leg 03/28/2020     Social History   Socioeconomic History   Marital status: Divorced    Spouse name: Not on file   Number of children: Not on file   Years of education: Not on file   Highest education level: Not on file  Occupational History   Occupation: retired  Tobacco Use   Smoking status: Former    Types: Cigarettes    Quit date: 07/24/1982    Years since quitting: 40.3   Smokeless tobacco: Never  Vaping Use   Vaping Use: Never used  Substance and Sexual Activity   Alcohol use: No    Alcohol/week: 0.0 standard drinks of alcohol   Drug use: No   Sexual activity: Not Currently  Other Topics Concern   Not on file  Social History Narrative   Not on file   Social Determinants of  Health   Financial Resource Strain: Not on file  Food Insecurity: No Food Insecurity (09/14/2022)   Hunger Vital Sign    Worried About Running Out of Food in the Last Year: Never true    Ran Out of Food in the Last Year: Never true  Transportation Needs: No Transportation Needs (09/14/2022)   PRAPARE - Hydrologist (Medical): No    Lack of Transportation (Non-Medical): No  Physical Activity: Not on file  Stress: Not on file  Social Connections: Not on file   Education Assessment and Provision:  Detailed education and instructions provided on heart failure disease management including the following:  Signs and symptoms of Heart Failure When to call the physician Importance of daily weights Low sodium diet Fluid restriction Medication management Anticipated future follow-up appointments  Patient education given on each of the above topics.  Patient acknowledges understanding via teach back method and acceptance of all instructions.  Education Materials:  "Living Better With Heart Failure" Booklet, HF zone tool, & Daily Weight Tracker Tool.  Patient has scale at home: yes Patient has pill box at home: NA    High Risk Criteria for Readmission and/or Poor Patient Outcomes: Heart failure hospital admissions (last 6 months): 1  No Show rate: 0 Difficult social situation: No Demonstrates medication adherence: Yes Primary Language: english  Literacy level: Reading, writing, and comprehension  Barriers of Care:   Continued HF education   Considerations/Referrals:   Referral made to Heart Failure Pharmacist Stewardship: yes Referral made to Heart Failure CSW/NCM TOC: No Referral made to Heart & Vascular TOC clinic: Yes, 11/23/22  Items for Follow-up on DC/TOC: Continued Hf education    Earnestine Leys, BSN, RN Heart Failure Leisure centre manager Chat Only

## 2022-11-09 NOTE — Progress Notes (Incomplete)
Heart Failure Nurse Navigator Progress Note  PCP: Windy Fast, MD PCP-Cardiologist: *** Admission Diagnosis: *** Admitted from: ***  Presentation:   Alexander Austin presented with ***  ECHO/ LVEF: ***  Clinical Course:  Past Medical History:  Diagnosis Date   A-fib [facility])    Anxiety    Benign prostate hyperplasia    Cancer ([place])    Kidney   Chronic kidney disease    Congestive heart failure (CHF) (HCC)    CVA (cerebral infarction)    Depression    Dyspnea    GERD (gastroesophageal reflux disease)    Gout    Hematospermia 01/13/2012   History of myocardial infarction 1993   Hyperlipidemia    Hypertension    Sleep apnea    Stroke ([place])    Vitamin B 12 deficiency    Weakness of left leg 03/28/2020     Social History   Socioeconomic History   Marital status: Divorced    Spouse name: Not on file   Number of children: Not on file   Years of education: Not on file   Highest education level: Not on file  Occupational History   Occupation: retired  Tobacco Use   Smoking status: Former    Types: Cigarettes    Quit date: 07/24/1982    Years since quitting: 40.3   Smokeless tobacco: Never  Vaping Use   Vaping Use: Never used  Substance and Sexual Activity   Alcohol use: No    Alcohol/week: 0.0 standard drinks of alcohol   Drug use: No   Sexual activity: Not Currently  Other Topics Concern   Not on file  Social History Narrative   Not on file   Social Determinants of Health   Financial Resource Strain: Low Risk  (11/09/2022)   Overall Financial Resource Strain (CARDIA)    Difficulty of Paying Living Expenses: Not hard at all  Food Insecurity: No Food Insecurity (09/14/2022)   Hunger Vital Sign    Worried About Running Out of Food in the Last Year: Never true    Ran Out of Food in the Last Year: Never true  Transportation Needs: No Transportation Needs (11/09/2022)   PRAPARE - Hydrologist (Medical): No    Lack of Transportation  (Non-Medical): No  Physical Activity: Not on file  Stress: Not on file  Social Connections: Not on file    High Risk Criteria for Readmission and/or Poor Patient Outcomes: Heart failure hospital admissions (last 6 months): ***  No Show rate: *** Difficult social situation: *** Demonstrates medication adherence: *** Primary Language: *** Literacy level: ***  Barriers of Care:   ***  Considerations/Referrals:   Referral made to Heart Failure Pharmacist Stewardship: *** Referral made to Heart Failure CSW/NCM TOC: *** Referral made to Heart & Vascular TOC clinic: ***  Items for Follow-up on DC/TOC: ***   ***

## 2022-11-09 NOTE — Progress Notes (Signed)
   Heart Failure Stewardship Pharmacist Progress Note   PCP: Windy Fast, MD PCP-Cardiologist: None    HPI:  Alexander Austin is a 76 year old male with PMH of  paroxysmal atrial fibrillation, hypertension, CVA, recent CEA 09/2022, NSTEMI s/p CABG redo in 2016, BiV ICD in 2017, gout, and obesity who presented with a 2 day history of dyspnea, orthopnea, and PND.  Most recent echocardiogram from 09/14/22 showed LVEF of 50-55% with grade II diastolic dysfunction and trivial aortic regurgitation.  Current HF Medications: Diuretic: Lasix 60 mg IV BID Beta Blocker: Coreg 12.5 mg BID Other: hydralazine 50 mg TID, amlodipine 10 mg QD  Prior to admission HF Medications: Beta blocker: Coreg 12.5 mg BID Other: hydralazine 50 mg TID, amlodipine 10 mg QD  Pertinent Lab Values: Serum creatinine 1.59 (BL ~1.6), BUN 24, Potassium 2.9, Sodium 145, BNP 736.8, Magnesium 1.8, A1c 4.7   Vital Signs: Weight: 239.2 lbs (admission weight: 242 lbs stated) Blood pressure: 140-180s/60s  Heart rate: 50-60s AV paced I/O: -2.9L yesterday; net -3.2L this admission  Medication Assistance / Insurance Benefits Check: Does the patient have prescription insurance?  Yes Type of insurance plan: [place]  Outpatient Pharmacy[facility]  [facility] Is the patient willing to use [place] at discharge? Yes Is the patient willing to transition their outpatient pharmacy to utilize a [facility] outpatient pharmacy?   No    Assessment: 1. Acute on chronic diastolic CHF (LVEF 57-84%), due to mixed non-ICM and ICM. NYHA class II-III symptoms. - Maintain strict I/Os, daily weights, Mg >2, and K >4. - Spironolactone will benefit HFpEF and help to maintain normokalemia - Patient has limited mobility after CVA and current foley catheter. Would benefit from SGLT2i for HFpEF if foley is eventually removed. - BB with history of ACS, HR limits titration.   Plan: 1) Medication changes recommended at this  time: - Start spironolactone 25 mg daily - Supplement potassium 40 meq every 4 hours x 3 doses - Supplement with Mg 2g IV   2) Patient assistance: - none, [facility] covers [place]  3)  Education  - Initial education performed, to be completed prior to discharge  Thank you for allowing pharmacy to participate in this patient's care.  Reatha Harps, PharmD PGY2 Pharmacy Resident 11/09/2022 7:20 AM Check [email or web address] for unit specific pharmacy number

## 2022-11-09 NOTE — Progress Notes (Signed)
Progress Note   Patient: Alexander Austin WPY:[id number] DOB: [birth year] DOA: 11/07/2022     1 DOS: the patient was seen and examined on 11/09/2022   Brief hospital course: Alexander Austin was admitted to the hospital with the working diagnosis of decompensated heart failure.   76 yo male with the past medical history of paroxysmal atrial fibrillation, hypertension, CVA, and obesity who presented with dyspnea. Reported 2 days of dyspnea, with orthopnea and PND. Positive lower extremity edema. Symptoms that prompted him to come to the ED. On his initial physical examination his blood pressure was 154/63, HR 53, RR 26, 02 saturation 94% on 5 L/min per [place]. Lungs had no wheezing or rales, heart with S1 and S2 present and regular, abdomen with no distention, positive lower extremity edema.   Na 145, K 3,6 CL 113 bicarbonate 232, glucose 104 bun 23 cr 1,36  BNP 736 High sensitive troponin 21 and 20  Wbc 4,3 hgb 11,0 plt 171   Chest radiograph with cardiomegaly, bilateral hilar vascular congestion, with no effusion, cephalization of the vasculature. Pacemaker defibrillator in place with one atrial and one ventricular lead, sternotomy wires in place.   EKG 65 bpm, left axis deviation, left bundle morphology, atrial fibrillation with ventricular paced rhythm, qtc 511, with no significant ST segment or T wave changes.   Patient has been placed on IV furosemide for diuresis.    Assessment and Plan: * Acute on chronic diastolic CHF (congestive heart failure) (HCC) Echocardiogram with preserved LV systolic function with EF 50 to 05%, RV systolic function preserved, LA with severe dilatation,  Urine output is 3976 ml Systolic blood pressure 734 to 160 mmHg.   Resume carvedilol for B blockade and amlodipine for blood pressure control Continue to hold on RAS inhibition due to low GFR.   Essential hypertension, benign Continue blood pressure control with amlodipine, hydralazine and carvedilol Continue diuresis  with IV furosemide.   PAF (paroxysmal atrial fibrillation) (HCC) Rate control with carvedilol and anticoagulation with apixaban Continue telemetry monitoring   Chronic kidney disease, stage 3a (HCC) Hypokalemia. Hypomagnesemia   Baseline serum cr 1,5 to 1,6 Patient with improvement in volume status but not yet back to baseline. Renal function with serum cr at 1,59 with K at 2,9 and serum bicarbonate at 25. Mg 1,8  Add Kcl 40 meq bid and follow up renal function and electrolytes in am.  Add 2 g Mag sulfate.   Status post carotid endarterectomy History of CVA with limited mobility, he uses a walker for ambulation.  CEA last month Continue with clopidogrel and statin therapy  Continue with edoxaban.  Follow up with PT and OT  Obesity (BMI 30.0-34.9) Calculated BMI 31.9 consistent with obesity class 1  Urinary retention No foley catheter.         Subjective: Patient is feeling better today but not back to baseline, no chest pain, continue very weak and deconditioned   Physical Exam: Vitals:   11/08/22 2223 11/09/22 0039 11/09/22 0449 11/09/22 0810  BP: (!) 151/66 (!) 150/64 (!) 151/77 (!) 159/70  Pulse:  (!) 57 60 (!) 55  Resp:  (!) '24 20 20  '$ Temp:  98 F (36.7 C) 97.6 F (36.4 C) 98.5 F (36.9 C)  TempSrc:  Oral Oral Oral  SpO2:  98% 95% 94%  Weight:   108.5 kg   Height:       Neurology awake and alert ENT with mild pallor Cardiovascular with S1 and S2 present with no gallops, rubs  or murmurs Respiratory with mild rales with no wheezing or rhonchi  Abdomen with no distention  Lower extremity edema +  Data Reviewed:    Family Communication: no family at the bedside   Disposition: Status is: Inpatient Remains inpatient appropriate because: heart failure   Planned Discharge Destination: Home      Author: Tawni Millers, MD 11/09/2022 11:24 AM  For on call review [email or web address].

## 2022-11-09 NOTE — Progress Notes (Incomplete)
PROGRESS NOTE    Alexander Austin  GEX:[id number] DOB: [birth year] DOA: 11/07/2022 PCP: Windy Fast, MD  76/M w/ history of P.Afib, hypertension, CVA, recent CEA, chronic diastolic CHF and obesity who presented with dyspnea. Reported 2 days of dyspnea, with orthopnea, PND, edema.  - 02 saturation 94% on 5 L/min ,  cr 1.3, BNP 736, troponin 21 and 20  -CXR w/ cardiomegaly, bilateral hilar vascular congestion Patient has been placed on IV furosemide for diuresis.   Subjective:   Assessment and Plan:  Acute on chronic diastolic CHF -Echo EF 50 to 01%, RV systolic function preserved, grade 2 diastolic dysfunction, LA with severe dilatation, -continue IV lasix - carvedilol and amlodipine -add GDMT -avoid SGLT2i w/ urinary retention  Essential hypertension, benign Continue  amlodipine, hydralazine and carvedilol -meds as above  PAF (paroxysmal atrial fibrillation) (HCC) -carvedilol and apixaban  Chronic kidney disease, stage 3a (HCC) Hypokalemia. Hypomagnesemia  -Baseline serum cr 1,5 to 1,6 -stable, monitor, replace lytes  Status post carotid endarterectomy H/o CVA -he uses a walker for ambulation.  CEA last month Continue with clopidogrel and statin therapy  Continue with edoxaban.   Obesity (BMI 30.0-34.9) Calculated BMI 31.9 consistent with obesity class 1  Urinary retention No foley catheter.   DVT prophylaxis:edoxaban Code Status: Limited Code Family Communication: Disposition Plan:   Consultants:    Procedures:   Antimicrobials:    Objective: Vitals:   11/09/22 0449 11/09/22 0810 11/09/22 1221 11/09/22 1541  BP: (!) 151/77 (!) 159/70 (!) 152/65 (!) 148/72  Pulse: 60 (!) 55 60 62  Resp: 20 20 (!) 22 18  Temp: 97.6 F (36.4 C) 98.5 F (36.9 C) 97.8 F (36.6 C) 98.4 F (36.9 C)  TempSrc: Oral Oral Oral Oral  SpO2: 95% 94% 94% 97%  Weight: 108.5 kg     Height:        Intake/Output Summary (Last 24 hours) at 11/09/2022 1653 Last data filed at  11/09/2022 1628 Gross per 24 hour  Intake 1496 ml  Output 850 ml  Net 646 ml   Filed Weights   11/07/22 2327 11/09/22 0449  Weight: 109.8 kg 108.5 kg    Examination:  General exam: Appears calm and comfortable  Respiratory system: Clear to auscultation Cardiovascular system: S1 & S2 heard, RRR.  Abd: nondistended, soft and nontender.Normal bowel sounds heard. Central nervous system: Alert and oriented. No focal neurological deficits. Extremities: no edema Skin: No rashes Psychiatry:  Mood & affect appropriate.     Data Reviewed:   CBC: Recent Labs  Lab 11/07/22 2328  WBC 4.3  HGB 11.0*  HCT 34.9*  MCV 93.3  PLT 027   Basic Metabolic Panel: Recent Labs  Lab 11/07/22 2328 11/09/22 0155  NA 145 145  K 3.6 2.9*  CL 113* 108  CO2 23 25  GLUCOSE 104* 122*  BUN 23 24*  CREATININE 1.36* 1.59*  CALCIUM 9.2 8.4*  MG  --  1.8   GFR: Estimated Creatinine Clearance: 50.2 mL/min (A) (by C-G formula based on SCr of 1.59 mg/dL (H)). Liver Function Tests: Recent Labs  Lab 11/07/22 2328  AST 17  ALT 16  ALKPHOS 89  BILITOT 0.9  PROT 5.7*  ALBUMIN 3.2*   No results for input(s): "LIPASE", "AMYLASE" in the last 168 hours. No results for input(s): "AMMONIA" in the last 168 hours. Coagulation Profile: Recent Labs  Lab 11/07/22 2328  INR 1.2   Cardiac Enzymes: No results for input(s): "CKTOTAL", "CKMB", "CKMBINDEX", "TROPONINI" in the last 168  hours. BNP (last 3 results) No results for input(s): "PROBNP" in the last 8760 hours. HbA1C: No results for input(s): "HGBA1C" in the last 72 hours. CBG: No results for input(s): "GLUCAP" in the last 168 hours. Lipid Profile: No results for input(s): "CHOL", "HDL", "LDLCALC", "TRIG", "CHOLHDL", "LDLDIRECT" in the last 72 hours. Thyroid Function Tests: No results for input(s): "TSH", "T4TOTAL", "FREET4", "T3FREE", "THYROIDAB" in the last 72 hours. Anemia Panel: No results for input(s): "VITAMINB12", "FOLATE",  "FERRITIN", "TIBC", "IRON", "RETICCTPCT" in the last 72 hours. Urine analysis:    Component Value Date/Time   COLORURINE YELLOW 09/14/2022 [place] 09/14/2022 0553   LABSPEC 1.010 09/14/2022 0553   PHURINE 5.5 09/14/2022 [place] 09/14/2022 0553   HGBUR NEGATIVE 09/14/2022 0553   BILIRUBINUR NEGATIVE 09/14/2022 0553   KETONESUR NEGATIVE 09/14/2022 0553   PROTEINUR NEGATIVE 09/14/2022 0553   NITRITE NEGATIVE 09/14/2022 0553   LEUKOCYTESUR NEGATIVE 09/14/2022 0553   Sepsis Labs: '@LABRCNTIP'$ (procalcitonin:4,lacticidven:4)  )No results found for this or any previous visit (from the past 240 hour(s)).   Radiology Studies: DG Chest Port 1 View  Result Date: 11/07/2022 CLINICAL DATA:  Shortness of breath, question edema. EXAM: PORTABLE CHEST 1 VIEW COMPARISON:  Chest x-ray 09/21/2022.  Chest CT 07/25/2018. FINDINGS: Heart is enlarged, unchanged. Patient is status post cardiac surgery. ICD is again noted. There is a stable calcified granuloma in the left mid lung. There is some minimal strandy and patchy opacities in the lung bases, left greater than right. There is no pneumothorax or pleural effusion. No acute fractures are seen. IMPRESSION: 1. Minimal opacities may represent atelectasis or infection. 2. Stable cardiomegaly. Electronically Signed   By: Ronney Asters M.D.   On: 11/07/2022 23:38     Scheduled Meds:  amLODipine  10 mg Oral Daily   atorvastatin  80 mg Oral QHS   calcitRIOL  0.25 mcg Oral Daily   carvedilol  12.5 mg Oral BID WC   clopidogrel  75 mg Oral Daily   docusate sodium  200 mg Oral Daily   edoxaban  60 mg Oral Q24H   escitalopram  20 mg Oral QHS   febuxostat  80 mg Oral Daily   furosemide  60 mg Intravenous BID   hydrALAZINE  50 mg Oral TID   pantoprazole  40 mg Oral QAC breakfast   primidone  100 mg Oral QHS   sodium chloride flush  3 mL Intravenous Q12H   [START ON 11/14/2022] Vitamin D (Ergocalciferol)  50,000 Units Oral Q7  days   Continuous Infusions:  sodium chloride       LOS: 1 day    Time spent: 9mn    PDomenic Polite MD [facility]   11/09/2022, 4:53 PM

## 2022-11-10 ENCOUNTER — Inpatient Hospital Stay (HOSPITAL_COMMUNITY): Payer: No Typology Code available for payment source

## 2022-11-10 DIAGNOSIS — I5033 Acute on chronic diastolic (congestive) heart failure: Secondary | ICD-10-CM | POA: Diagnosis not present

## 2022-11-10 DIAGNOSIS — I10 Essential (primary) hypertension: Secondary | ICD-10-CM | POA: Diagnosis not present

## 2022-11-10 DIAGNOSIS — I480 Paroxysmal atrial fibrillation: Secondary | ICD-10-CM | POA: Diagnosis not present

## 2022-11-10 DIAGNOSIS — N1831 Chronic kidney disease, stage 3a: Secondary | ICD-10-CM | POA: Diagnosis not present

## 2022-11-10 LAB — BASIC METABOLIC PANEL
Anion gap: 6 (ref 5–15)
BUN: 28 mg/dL — ABNORMAL HIGH (ref 8–23)
CO2: 26 mmol/L (ref 22–32)
Calcium: 8.4 mg/dL — ABNORMAL LOW (ref 8.9–10.3)
Chloride: 111 mmol/L (ref 98–111)
Creatinine, Ser: 1.86 mg/dL — ABNORMAL HIGH (ref 0.61–1.24)
GFR, Estimated: 37 mL/min — ABNORMAL LOW (ref >60)
Glucose, Bld: 97 mg/dL (ref 70–99)
Potassium: 3.9 mmol/L (ref 3.5–5.1)
Sodium: 143 mmol/L (ref 135–145)

## 2022-11-10 LAB — MAGNESIUM: Magnesium: 2.5 mg/dL — ABNORMAL HIGH (ref 1.7–2.4)

## 2022-11-10 NOTE — Progress Notes (Signed)
   Heart Failure Stewardship Pharmacist Progress Note   PCP: Windy Fast, MD PCP-Cardiologist: None    HPI:  Alexander Austin is a 76 year old male with PMH of  paroxysmal atrial fibrillation, hypertension, CVA, recent CEA 09/2022, NSTEMI s/p CABG redo in 2016, BiV ICD in 2017, gout, and obesity who presented with a 2 day history of dyspnea, orthopnea, and PND.  Most recent echocardiogram from 09/14/22 showed LVEF of 50-55% with grade II diastolic dysfunction and trivial aortic regurgitation.  Current HF Medications: Beta Blocker: Coreg 12.5 mg BID Other: hydralazine 50 mg TID, amlodipine 10 mg QD  Prior to admission HF Medications: Beta blocker: Coreg 12.5 mg BID Other: hydralazine 50 mg TID, amlodipine 10 mg QD  Pertinent Lab Values: Serum creatinine 1.86 (BL ~1.6), BUN 28, Potassium 3.9, Sodium 143, BNP 736.8, Magnesium 2.5, A1c 4.7   Vital Signs: Weight: 242 lbs (admission weight: 242 lbs stated) Blood pressure: 140/60s  Heart rate: 50-60s AV paced I/O: -1.3L yesterday; net -3.3L this admission  Medication Assistance / Insurance Benefits Check: Does the patient have prescription insurance?  Yes Type of insurance plan: [place]  Outpatient Pharmacy[facility]  [facility] Is the patient willing to use [place] at discharge? Yes Is the patient willing to transition their outpatient pharmacy to utilize a [facility] outpatient pharmacy?   No    Assessment: 1. Acute on chronic diastolic CHF (LVEF 03-88%), due to mixed non-ICM and ICM. NYHA class II-III symptoms. - Off IV lasix with creatinine bump. Maintain strict I/Os, daily weights, Mg >2, and K >4. - Spironolactone will benefit HFpEF and help to maintain normokalemia - Patient has limited mobility after CVA and current foley catheter. No SGLT2i at this time. - BB with history of ACS, HR limits titration.   Plan: 1) Medication changes recommended at this time: - Start spironolactone 25 mg daily   2)  Patient assistance: - none, [facility] covers [place] and [place]  3)  Education  - Initial education performed - Full education to be completed prior to discharge  Thank you for allowing pharmacy to participate in this patient's care.  Kerby Nora, PharmD, BCPS Heart Failure Stewardship Pharmacist Phone [phone]

## 2022-11-10 NOTE — Evaluation (Signed)
Occupational Therapy Evaluation Patient Details Name: Alexander Austin MRN: [id number] DOB: 28-May-1946 Today's Date: 11/10/2022   History of Present Illness Pt is a 76 y.o. male admitted 11/07/22 with dyspnea. Workup for acute on chronic CHF. Of note, recent admission 08/2022 with age-indeterminate L frontal infarct. Other PMH includes CHF, CAD s/p CABG, pacemaker, afib, pulmonary HTN, CKD 4, renal cell CA s/p nephrectomy.   Clinical Impression   Pt reports assist at baseline for ADLs and uses rollator for mobility. Pt lives alone, however has assist from PCA (5x/week to assist with ADLs) and landlord (lives above him, assists with transportation and financial mgmt), pt also reports RN comes biweekly to assist with med mgmt. Pt currently needing supervision-min A for ADLs, mod A for bed mobility, and min A for transfers with RW. Pt on 2L O2 upon arrival, attempted ambulation on RA, pt SpO2 >92% throughout, pt reporting 3/4 DOE. Discussed with RN and 2L reapplied at end of session. Pt presenting with impairments listed below, will follow acutely. Recommend HHOT at d/c.      Recommendations for follow up therapy are one component of a multi-disciplinary discharge planning process, led by the attending physician.  Recommendations may be updated based on patient status, additional functional criteria and insurance authorization.   Follow Up Recommendations  Home health OT     Assistance Recommended at Discharge Intermittent Supervision/Assistance  Patient can return home with the following A little help with walking and/or transfers;A little help with bathing/dressing/bathroom;Direct supervision/assist for medications management;Direct supervision/assist for financial management;Help with stairs or ramp for entrance;Assist for transportation;Assistance with cooking/housework    Functional Status Assessment  Patient has had a recent decline in their functional status and demonstrates the ability to  make significant improvements in function in a reasonable and predictable amount of time.  Equipment Recommendations  None recommended by OT (pt has all needed DME)    Recommendations for Other Services PT consult     Precautions / Restrictions Precautions Precautions: Fall Restrictions Weight Bearing Restrictions: No      Mobility Bed Mobility Overal bed mobility: Needs Assistance Bed Mobility: Supine to Sit     Supine to sit: Mod assist     General bed mobility comments: mod A for trunk elevation    Transfers Overall transfer level: Needs assistance Equipment used: Rolling walker (2 wheels) Transfers: Sit to/from Stand Sit to Stand: Min assist                  Balance Overall balance assessment: Needs assistance   Sitting balance-Leahy Scale: Good Sitting balance - Comments: able to don/doff slippers sitting EOB   Standing balance support: No upper extremity supported, During functional activity Standing balance-Leahy Scale: Fair                             ADL either performed or assessed with clinical judgement   ADL Overall ADL's : Needs assistance/impaired Eating/Feeding: Set up   Grooming: Set up   Upper Body Bathing: Minimal assistance   Lower Body Bathing: Minimal assistance   Upper Body Dressing : Minimal assistance Upper Body Dressing Details (indicate cue type and reason): donning and doffing gown Lower Body Dressing: Minimal assistance Lower Body Dressing Details (indicate cue type and reason): donning slippers Toilet Transfer: Min guard;Rolling walker (2 wheels);Ambulation;Regular Toilet   Toileting- Water quality scientist and Hygiene: Supervision/safety Toileting - Clothing Manipulation Details (indicate cue type and reason): pericare  Functional mobility during ADLs: Min guard;Rolling walker (2 wheels)       Vision   Vision Assessment?: No apparent visual deficits     Perception Perception Perception Tested?:  No   Praxis Praxis Praxis tested?: Not tested    Pertinent Vitals/Pain Pain Assessment Pain Assessment: No/denies pain     Hand Dominance     Extremity/Trunk Assessment Upper Extremity Assessment Upper Extremity Assessment: Generalized weakness (mild RUE weakness from prior CVA, can lift RUE against gravity, can bring hand to mouth, can perform digit composite flex/ext and digit opposition)   Lower Extremity Assessment Lower Extremity Assessment: Defer to PT evaluation   Cervical / Trunk Assessment Cervical / Trunk Assessment: Kyphotic   Communication Communication Communication: HOH   Cognition Arousal/Alertness: Awake/alert Behavior During Therapy: WFL for tasks assessed/performed Overall Cognitive Status: Within Functional Limits for tasks assessed                                       General Comments  VSS On 2L O2, HR 60-70    Exercises     Shoulder Instructions      Home Living Family/patient expects to be discharged to:: Private residence Living Arrangements: Alone Available Help at Discharge: Neighbor;Personal care attendant;Available PRN/intermittently Type of Home: House Home Access: Level entry     Home Layout: Two level;Able to live on main level with bedroom/bathroom;Other (Comment) (has chair lift) Alternate Level Stairs-Number of Steps: flight -has a stair lift. says he only goes upstairs if landlord makes dinner (lives above him)   Civil engineer, contracting Shower/Tub: Occupational psychologist: [place]: Rollator (4 wheels);Shower seat - built in;Cane - Insurance underwriter Comments: does not wear O2 at home      Prior Functioning/Environment Prior Level of Function : Needs assist             Mobility Comments: Mod indep ambulating with walker "as needed." sleeps in recliner. currently working with [place] since recent admission 08/2022 ADLs Comments: Has aide 5x/wk who assists with housework,  meals, bathing, other iADLs. Aide assists with bathing, has RN that comes in to assist with med mgmt every 2 weeks. Landlord assist with transportation and financial mgmt        OT Problem List: Decreased strength;Decreased range of motion;Decreased activity tolerance;Impaired balance (sitting and/or standing);Cardiopulmonary status limiting activity      OT Treatment/Interventions: Self-care/ADL training;Therapeutic exercise;Energy conservation;DME and/or AE instruction;Therapeutic activities;Balance training;Patient/family education    OT Goals(Current goals can be found in the care plan section) Acute Rehab OT Goals Patient Stated Goal: none stated OT Goal Formulation: With patient Time For Goal Achievement: 11/24/22 Potential to Achieve Goals: Good ADL Goals Pt Will Perform Upper Body Dressing: with modified independence;sitting Pt Will Perform Lower Body Dressing: with modified independence;sitting/lateral leans;sit to/from stand Pt Will Transfer to Toilet: with modified independence;ambulating;regular height toilet Pt Will Perform Tub/Shower Transfer: Tub transfer;Shower transfer;with supervision;ambulating;shower seat;rolling walker Additional ADL Goal #1: pt will verbalize 3 energy conservation strategies in order to improve activity tolerance for ADLs  OT Frequency: Min 2X/week    Co-evaluation              AM-PAC OT "6 Clicks" Daily Activity     Outcome Measure Help from another person eating meals?: None Help from another person taking care of personal grooming?: A Little Help from another person toileting, which  includes using toliet, bedpan, or urinal?: A Little Help from another person bathing (including washing, rinsing, drying)?: A Little Help from another person to put on and taking off regular upper body clothing?: A Little Help from another person to put on and taking off regular lower body clothing?: A Little 6 Click Score: 19   End of Session Equipment  Utilized During Treatment: Gait belt;Rolling walker (2 wheels);Oxygen Nurse Communication: Mobility status  Activity Tolerance: Patient tolerated treatment well Patient left: in chair;with call bell/phone within reach;with chair alarm set  OT Visit Diagnosis: Unsteadiness on feet (R26.81);Muscle weakness (generalized) (M62.81);Other abnormalities of gait and mobility (R26.89)                Time: 7416-3845 OT Time Calculation (min): 23 min Charges:  OT General Charges $OT Visit: 1 Visit OT Evaluation $OT Eval Moderate Complexity: 1 Mod OT Treatments $Self Care/Home Management : 8-22 mins  Renaye Rakers, OTD, OTR/L SecureChat Preferred Acute Rehab [phone]   Renaye Rakers [place] 11/10/2022, 11:59 AM

## 2022-11-10 NOTE — Progress Notes (Signed)
PROGRESS NOTE    Alexander Austin  DXI:[id number] DOB: [birth year] DOA: 11/07/2022 PCP: Windy Fast, MD  76/M w/ history of P.Afib, hypertension, CVA, recent CEA, chronic diastolic CHF and obesity who presented with dyspnea. Reported 2 days of dyspnea, with orthopnea, PND, edema.  - 02 saturation 94% on 5 L/min ,  cr 1.3, BNP 736, troponin 21 and 20  -CXR w/ cardiomegaly, bilateral hilar vascular congestion -Started on diuretics, clinically improving, now worsening AKI  Subjective: -Feels okay overall, breathing is improving  Assessment and Plan:  Acute on chronic diastolic CHF -Echo EF 50 to 76%, RV systolic function preserved, grade 2 diastolic dysfunction, LA with severe dilatation, -Diuresed with IV Lasix, he is 3.4 L negative, creatinine has trended up to 1.8 today -Hold IV Lasix today -GDMT limited by AKI -avoid SGLT2i w/ history of urinary retention   Essential hypertension, benign Continue  amlodipine, hydralazine and carvedilol -meds as above   PAF (paroxysmal atrial fibrillation) (HCC) -carvedilol and apixaban   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 Hypomagnesemia  -Baseline serum cr 1,5 to 1,6 -Mild worsening, will check renal ultrasound, hold diuretics today   Status post carotid endarterectomy H/o CVA -he uses a walker for ambulation.  CEA last month Continue with clopidogrel and statin therapy  Continue with edoxaban.    Obesity (BMI 30.0-34.9) Calculated BMI 31.9 consistent with obesity class 1   Urinary retention -Does not have a Foley at this time, urinating without issues reportedly, follow-up renal ultrasound   DVT prophylaxis:edoxaban Code Status: Limited Code Family Communication: None present Disposition Plan:   Consultants:    Procedures:   Antimicrobials:    Objective: Vitals:   11/09/22 2016 11/09/22 2137 11/10/22 0549 11/10/22 0753  BP: 136/61 (!) 151/62 (!) 140/60 (!) 143/59  Pulse: 62  60 (!) 55  Resp: '18   20 20  '$ Temp: 98 F (36.7 C)  98.6 F (37 C) 98.2 F (36.8 C)  TempSrc: Oral  Oral Oral  SpO2: 97%  97% 94%  Weight:   109.8 kg   Height:        Intake/Output Summary (Last 24 hours) at 11/10/2022 1411 Last data filed at 11/10/2022 0753 Gross per 24 hour  Intake 173 ml  Output 725 ml  Net -552 ml   Filed Weights   11/07/22 2327 11/09/22 0449 11/10/22 0549  Weight: 109.8 kg 108.5 kg 109.8 kg    Examination:  General exam: Appears calm and comfortable, AAOx3, mild cognitive deficits Respiratory system: Clear to auscultation Cardiovascular system: S1 & S2 heard, RRR.  Abd: nondistended, soft and nontender.Normal bowel sounds heard. Central nervous system: Alert and oriented. No focal neurological deficits. Extremities: 1 plus edema Skin: No rashes Psychiatry:  Mood & affect appropriate.     Data Reviewed:   CBC: Recent Labs  Lab 11/07/22 2328  WBC 4.3  HGB 11.0*  HCT 34.9*  MCV 93.3  PLT 734   Basic Metabolic Panel: Recent Labs  Lab 11/07/22 2328 11/09/22 0155 11/10/22 0330  NA 145 145 143  K 3.6 2.9* 3.9  CL 113* 108 111  CO2 '23 25 26  '$ GLUCOSE 104* 122* 97  BUN 23 24* 28*  CREATININE 1.36* 1.59* 1.86*  CALCIUM 9.2 8.4* 8.4*  MG  --  1.8 2.5*   GFR: Estimated Creatinine Clearance: 43.2 mL/min (A) (by C-G formula based on SCr of 1.86 mg/dL (H)). Liver Function Tests: Recent Labs  Lab 11/07/22 2328  AST 17  ALT 16  ALKPHOS 89  BILITOT 0.9  PROT 5.7*  ALBUMIN 3.2*   No results for input(s): "LIPASE", "AMYLASE" in the last 168 hours. No results for input(s): "AMMONIA" in the last 168 hours. Coagulation Profile: Recent Labs  Lab 11/07/22 2328  INR 1.2   Cardiac Enzymes: No results for input(s): "CKTOTAL", "CKMB", "CKMBINDEX", "TROPONINI" in the last 168 hours. BNP (last 3 results) No results for input(s): "PROBNP" in the last 8760 hours. HbA1C: No results for input(s): "HGBA1C" in the last 72 hours. CBG: No results for input(s):  "GLUCAP" in the last 168 hours. Lipid Profile: No results for input(s): "CHOL", "HDL", "LDLCALC", "TRIG", "CHOLHDL", "LDLDIRECT" in the last 72 hours. Thyroid Function Tests: No results for input(s): "TSH", "T4TOTAL", "FREET4", "T3FREE", "THYROIDAB" in the last 72 hours. Anemia Panel: No results for input(s): "VITAMINB12", "FOLATE", "FERRITIN", "TIBC", "IRON", "RETICCTPCT" in the last 72 hours. Urine analysis:    Component Value Date/Time   COLORURINE YELLOW 09/14/2022 [place] 09/14/2022 0553   LABSPEC 1.010 09/14/2022 0553   PHURINE 5.5 09/14/2022 [place] 09/14/2022 0553   HGBUR NEGATIVE 09/14/2022 0553   BILIRUBINUR NEGATIVE 09/14/2022 0553   KETONESUR NEGATIVE 09/14/2022 0553   PROTEINUR NEGATIVE 09/14/2022 0553   NITRITE NEGATIVE 09/14/2022 0553   LEUKOCYTESUR NEGATIVE 09/14/2022 0553   Sepsis Labs: '@LABRCNTIP'$ (procalcitonin:4,lacticidven:4)  )No results found for this or any previous visit (from the past 240 hour(s)).   Radiology Studies: US RENAL  Result Date: 11/10/2022 CLINICAL DATA:  AKI EXAM: RENAL / URINARY TRACT ULTRASOUND COMPLETE COMPARISON:  Renal US 09/27/22 FINDINGS: Right Kidney: Renal measurements: 8.5 x 4.4 x 4.6 cm (previously 8.6 x 4.7 x 4.8 cm) = volume: 89 ml mL (previously 102 ml). Echogenicity slightly increased with thinning of the cortex. No mass or hydronephrosis visualized. Left Kidney: Renal measurements: 10 x 4.8 x 4.2 cm (previously 11 x 6 x 5.6 cm = volume: 105.2 mL (previously 191.9 ml). Echogenicity is slightly increased with thinning of the cortex. No mass or hydronephrosis visualized. Bladder: Appears normal for degree of bladder distention. Other: None. IMPRESSION: 1. No hydronephrosis. 2. Echogenicity of the kidneys is slightly increased with thinning of the cortex suggesting medical renal disease. Electronically Signed   By: Marin Roberts M.D.   On: 11/10/2022 10:10     Scheduled Meds:  amLODipine  10 mg Oral  Daily   atorvastatin  80 mg Oral QHS   calcitRIOL  0.25 mcg Oral Daily   carvedilol  12.5 mg Oral BID WC   clopidogrel  75 mg Oral Daily   docusate sodium  200 mg Oral Daily   edoxaban  60 mg Oral Q24H   escitalopram  20 mg Oral QHS   febuxostat  80 mg Oral Daily   hydrALAZINE  50 mg Oral TID   pantoprazole  40 mg Oral QAC breakfast   primidone  100 mg Oral QHS   sodium chloride flush  3 mL Intravenous Q12H   [START ON 11/14/2022] Vitamin D (Ergocalciferol)  50,000 Units Oral Q7 days   Continuous Infusions:  sodium chloride       LOS: 2 days    Time spent: 36mn    PDomenic Polite MD [facility]   11/10/2022, 2:11 PM

## 2022-11-10 NOTE — TOC Initial Note (Signed)
Transition of Care [facility]) - Initial/Assessment Note    Patient Details  Name: CODA MATHEY MRN: [id number] Date of Birth: Jul 17, 1946  Transition of [facility]) CM/SW Contact:    Bethena Roys, RN Phone Number: 11/10/2022, 3:02 PM  Clinical Narrative: Case Manager called the [facility]- patient is a member of the [facility]. PCP is Sherral Hammers and CSW is [employer] @ [phone] ext [phone]. Patient is currently active with [facility] for PT/OT- will need resumption orders and F2F once stable. No further needs identified at this time.                     Expected Discharge Plan: [place] Barriers to Discharge: No Barriers Identified   Patient Goals and CMS Choice Patient states their goals for this hospitalization and ongoing recovery are:: to return home      Expected Discharge Plan and Services Expected Discharge Plan: [place] In-house Referral: NA Discharge Planning Services: CM Consult Post Acute Care Choice: Home Health, Resumption of Svcs/PTA Provider Living arrangements for the past 2 months: Single Family Home  HH Arranged: PT, OT HH Agency: [facility] Date [facility] Agency Contacted: 11/10/22 Time HH Agency Contacted: [phone]    Prior Living Arrangements/Services Living arrangements for the past 2 months: Single Family Home   Patient language and need for interpreter reviewed:: Yes        Need for Family Participation in Patient Care: Yes (Comment) Care giver support system in place?: Yes (comment)   Criminal Activity/Legal Involvement Pertinent to Current Situation/Hospitalization: No - Comment as needed  Activities of Daily Living Home Assistive Devices/Equipment: Blood pressure cuff, Grab bars around toilet, Grab bars in shower, Hand-held shower hose, Hearing aid, Eyeglasses, Dentures (specify type), Cane (specify quad or straight), Scales, Wheelchair, Environmental consultant (specify type) (rollator, scooter, stair  lift) ADL Screening (condition at time of admission) Patient's cognitive ability adequate to safely complete daily activities?: Yes Is the patient deaf or have difficulty hearing?: Yes  Permission Sought/Granted Permission sought to share information with : Case Manager, Customer service manager, Family Supports Permission granted to share information with : Yes, Verbal Permission Granted     Permission granted to share info w AGENCY: [facility]        Emotional Assessment Appearance:: Appears stated age Attitude/Demeanor/Rapport: Engaged Affect (typically observed): Appropriate Orientation: : Oriented to Self, Oriented to Place, Oriented to  Time, Oriented to Situation Alcohol / Substance Use: Not Applicable Psych Involvement: No (comment)  Admission diagnosis:  Acute respiratory failure with hypoxia ([place]) [J96.01] Acute on chronic combined systolic and diastolic CHF (congestive heart failure) ([place]) [I50.43] Acute on chronic congestive heart failure, unspecified heart failure type [facility]) [I50.9] Patient Active Problem List   Diagnosis Date Noted   Acute on chronic diastolic CHF (congestive heart failure) ([place]) 11/08/2022   Status post carotid endarterectomy 11/08/2022   Acute on chronic combined systolic and diastolic CHF (congestive heart failure) ([place]) 11/08/2022   AICD (automatic cardioverter/defibrillator) present    Preoperative cardiovascular examination    Acute CVA (cerebrovascular accident) ([place]) 09/13/2022   Internal carotid artery stenosis, left 09/13/2022   Skin wound from surgical incision 09/13/2022   Gout 07/03/2020   Gout flare 05/21/2020   Chronic diastolic CHF (congestive heart failure) ([place]) 03/28/2020   Acute gout 03/07/2020   Chronic kidney disease, stage 3a ([place]) 03/07/2020   Obesity (BMI 30.0-34.9) 03/07/2020   Tremor 04/16/2015   BPH (benign prostatic hyperplasia) 04/16/2015  Chronic anticoagulation 04/16/2015   Edema 04/16/2015   Urinary  retention 03/06/2015   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 ([place]) 03/06/2015   NSTEMI (non-ST elevated myocardial infarction) ([place]) 03/06/2015   PAF (paroxysmal atrial fibrillation) ([place]) 03/06/2015   History of renal cell cancer 03/06/2015   Vitamin B12 deficiency 03/06/2015   Major depressive disorder, recurrent, in remission ([place]) 03/06/2015   History of stroke 03/06/2015   GERD (gastroesophageal reflux disease) 03/06/2015   Essential hypertension, benign 03/06/2015   Gout, arthritis 03/06/2015   PCP:  Windy Fast, MD Pharmacy:   [id number] [place] [address]Phone: [phone]: [phone] [id number] [address] [id number]: [phone]: [address]one: [phone]: [phone] [address]: [phone]: [phone]   Social Determinants of Health (SDOH) Interventions Housing Interventions: Intervention Not Indicated Transportation Interventions: Intervention Not Indicated Utilities Interventions: Intervention Not Indicated Alcohol Usage Interventions: Intervention Not Indicated (Score <7) Financial Strain Interventions: Intervention Not Indicated  Readmission Risk Interventions    11/10/2022    2:56 PM 07/04/2020   11:18 AM 04/03/2020   12:10 PM  Readmission Risk Prevention Plan  Transportation Screening Complete Complete Complete  PCP or Specialist Appt within 3-5 Days   Complete  HRI or HoWallingford Centeromplete  Complete  Social Work Consult for ReSegundolanning/Counseling Complete  Complete   Palliative Care Screening Not Applicable  Not Applicable  Medication Review (RPress photographerReferral to Pharmacy Referral to Pharmacy Complete  PCP or Specialist appointment within 3-5 days of discharge  Complete   HRI or HoSpragueComplete   SW Recovery Care/Counseling Consult  Complete   PaAnawaltPatient Refused

## 2022-11-11 DIAGNOSIS — I5033 Acute on chronic diastolic (congestive) heart failure: Secondary | ICD-10-CM | POA: Diagnosis not present

## 2022-11-11 LAB — BASIC METABOLIC PANEL
Anion gap: 12 (ref 5–15)
BUN: 37 mg/dL — ABNORMAL HIGH (ref 8–23)
CO2: 22 mmol/L (ref 22–32)
Calcium: 8.9 mg/dL (ref 8.9–10.3)
Chloride: 105 mmol/L (ref 98–111)
Creatinine, Ser: 2.08 mg/dL — ABNORMAL HIGH (ref 0.61–1.24)
GFR, Estimated: 32 mL/min — ABNORMAL LOW (ref >60)
Glucose, Bld: 122 mg/dL — ABNORMAL HIGH (ref 70–99)
Potassium: 3.3 mmol/L — ABNORMAL LOW (ref 3.5–5.1)
Sodium: 139 mmol/L (ref 135–145)

## 2022-11-11 MED ORDER — EDOXABAN TOSYLATE 30 MG PO TABS
30.0000 mg | ORAL_TABLET | ORAL | Status: DC
  Administered 2022-11-11 – 2022-11-14 (×4): 30 mg via ORAL
  Filled 2022-11-11 (×4): qty 1

## 2022-11-11 MED ORDER — POTASSIUM CHLORIDE CRYS ER 20 MEQ PO TBCR
40.0000 meq | EXTENDED_RELEASE_TABLET | Freq: Once | ORAL | Status: AC
  Administered 2022-11-11: 40 meq via ORAL
  Filled 2022-11-11: qty 2

## 2022-11-11 NOTE — Progress Notes (Signed)
PROGRESS NOTE    Alexander Austin  JSH:[id number] DOB: [birth year] DOA: 11/07/2022 PCP: Windy Fast, MD  76/M w/ history of P.Afib, hypertension, CVA, recent CEA, chronic diastolic CHF and obesity who presented with dyspnea. Reported 2 days of dyspnea, with orthopnea, PND, edema.  - 02 saturation 94% on 5 L/min ,  cr 1.3, BNP 736, troponin 21 and 20  -CXR w/ cardiomegaly, bilateral hilar vascular congestion -Started on diuretics, clinically improving, now worsening AKI  Subjective: -Feels okay overall, denies dyspnea today, still on oxygen  Assessment and Plan:  Acute on chronic diastolic CHF -Echo EF 50 to 85%, RV systolic function preserved, grade 2 diastolic dysfunction, LA with severe dilatation, -Diuresed with IV Lasix, he is 3.4 L negative, creatinine has bumped up to 2.0, holding IV Lasix -GDMT limited by AKI -avoid SGLT2i w/ history of urinary retention   Essential hypertension, benign Continue  amlodipine, hydralazine and carvedilol -meds as above   PAF (paroxysmal atrial fibrillation) (HCC) -carvedilol and apixaban   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 Hypomagnesemia  -Baseline serum cr is 1.6 -Creatinine went up to 2 today, renal ultrasound without hydronephrosis, holding diuretics 1 more day   Status post carotid endarterectomy H/o CVA -he uses a walker for ambulation.  CEA last month Continue with clopidogrel and statin therapy  Continue with edoxaban.    Obesity (BMI 30.0-34.9) Calculated BMI 31.9 consistent with obesity class 1   Urinary retention -Does not have a Foley at this time, urinating without issues reportedly, follow-up renal ultrasound   DVT prophylaxis:edoxaban Code Status: Limited Code Family Communication: None present Disposition Plan: Home likely 48 hours  Consultants:    Procedures:   Antimicrobials:    Objective: Vitals:   11/10/22 2020 11/10/22 2259 11/11/22 0514 11/11/22 0831  BP: (!) 147/60 (!)  167/69 (!) 158/67 (!) 157/65  Pulse: 64  61 (!) 54  Resp: 20  18   Temp: 97.9 F (36.6 C)  98.9 F (37.2 C)   TempSrc: Oral  Oral   SpO2: 97%  93% 94%  Weight:   109.2 kg   Height:        Intake/Output Summary (Last 24 hours) at 11/11/2022 1148 Last data filed at 11/11/2022 0500 Gross per 24 hour  Intake 723 ml  Output 800 ml  Net -77 ml   Filed Weights   11/09/22 0449 11/10/22 0549 11/11/22 0514  Weight: 108.5 kg 109.8 kg 109.2 kg    Examination:  General exam: Obese pleasant male sitting up in bed, AAOx3, mild cognitive deficits HEENT: No JVD, neck obese CVS: S1-S2, regular rhythm Lungs: Few basilar rales Abdomen: Soft, nontender, bowel sounds present Extremities: No edema Skin: No rashes Psychiatry:  Mood & affect appropriate.     Data Reviewed:   CBC: Recent Labs  Lab 11/07/22 2328  WBC 4.3  HGB 11.0*  HCT 34.9*  MCV 93.3  PLT 027   Basic Metabolic Panel: Recent Labs  Lab 11/07/22 2328 11/09/22 0155 11/10/22 0330 11/11/22 0126  NA 145 145 143 139  K 3.6 2.9* 3.9 3.3*  CL 113* 108 111 105  CO2 '23 25 26 22  '$ GLUCOSE 104* 122* 97 122*  BUN 23 24* 28* 37*  CREATININE 1.36* 1.59* 1.86* 2.08*  CALCIUM 9.2 8.4* 8.4* 8.9  MG  --  1.8 2.5*  --    GFR: Estimated Creatinine Clearance: 38.5 mL/min (A) (by C-G formula based on SCr of 2.08 mg/dL (H)). Liver Function Tests: Recent Labs  Lab 11/07/22 2328  AST 17  ALT 16  ALKPHOS 89  BILITOT 0.9  PROT 5.7*  ALBUMIN 3.2*   No results for input(s): "LIPASE", "AMYLASE" in the last 168 hours. No results for input(s): "AMMONIA" in the last 168 hours. Coagulation Profile: Recent Labs  Lab 11/07/22 2328  INR 1.2   Cardiac Enzymes: No results for input(s): "CKTOTAL", "CKMB", "CKMBINDEX", "TROPONINI" in the last 168 hours. BNP (last 3 results) No results for input(s): "PROBNP" in the last 8760 hours. HbA1C: No results for input(s): "HGBA1C" in the last 72 hours. CBG: No results for input(s):  "GLUCAP" in the last 168 hours. Lipid Profile: No results for input(s): "CHOL", "HDL", "LDLCALC", "TRIG", "CHOLHDL", "LDLDIRECT" in the last 72 hours. Thyroid Function Tests: No results for input(s): "TSH", "T4TOTAL", "FREET4", "T3FREE", "THYROIDAB" in the last 72 hours. Anemia Panel: No results for input(s): "VITAMINB12", "FOLATE", "FERRITIN", "TIBC", "IRON", "RETICCTPCT" in the last 72 hours. Urine analysis:    Component Value Date/Time   COLORURINE YELLOW 09/14/2022 [place] 09/14/2022 0553   LABSPEC 1.010 09/14/2022 0553   PHURINE 5.5 09/14/2022 [place] 09/14/2022 0553   HGBUR NEGATIVE 09/14/2022 0553   BILIRUBINUR NEGATIVE 09/14/2022 0553   KETONESUR NEGATIVE 09/14/2022 0553   PROTEINUR NEGATIVE 09/14/2022 0553   NITRITE NEGATIVE 09/14/2022 0553   LEUKOCYTESUR NEGATIVE 09/14/2022 0553   Sepsis Labs: '@LABRCNTIP'$ (procalcitonin:4,lacticidven:4)  )No results found for this or any previous visit (from the past 240 hour(s)).   Radiology Studies: US RENAL  Result Date: 11/10/2022 CLINICAL DATA:  AKI EXAM: RENAL / URINARY TRACT ULTRASOUND COMPLETE COMPARISON:  Renal US 09/27/22 FINDINGS: Right Kidney: Renal measurements: 8.5 x 4.4 x 4.6 cm (previously 8.6 x 4.7 x 4.8 cm) = volume: 89 ml mL (previously 102 ml). Echogenicity slightly increased with thinning of the cortex. No mass or hydronephrosis visualized. Left Kidney: Renal measurements: 10 x 4.8 x 4.2 cm (previously 11 x 6 x 5.6 cm = volume: 105.2 mL (previously 191.9 ml). Echogenicity is slightly increased with thinning of the cortex. No mass or hydronephrosis visualized. Bladder: Appears normal for degree of bladder distention. Other: None. IMPRESSION: 1. No hydronephrosis. 2. Echogenicity of the kidneys is slightly increased with thinning of the cortex suggesting medical renal disease. Electronically Signed   By: Marin Roberts M.D.   On: 11/10/2022 10:10     Scheduled Meds:  amLODipine  10 mg Oral  Daily   atorvastatin  80 mg Oral QHS   calcitRIOL  0.25 mcg Oral Daily   carvedilol  12.5 mg Oral BID WC   clopidogrel  75 mg Oral Daily   docusate sodium  200 mg Oral Daily   edoxaban  60 mg Oral Q24H   escitalopram  20 mg Oral QHS   febuxostat  80 mg Oral Daily   hydrALAZINE  50 mg Oral TID   pantoprazole  40 mg Oral QAC breakfast   primidone  100 mg Oral QHS   sodium chloride flush  3 mL Intravenous Q12H   [START ON 11/14/2022] Vitamin D (Ergocalciferol)  50,000 Units Oral Q7 days   Continuous Infusions:  sodium chloride       LOS: 3 days    Time spent: 29mn    PDomenic Polite MD [facility]   11/11/2022, 11:48 AM

## 2022-11-12 DIAGNOSIS — I480 Paroxysmal atrial fibrillation: Secondary | ICD-10-CM | POA: Diagnosis not present

## 2022-11-12 DIAGNOSIS — I5033 Acute on chronic diastolic (congestive) heart failure: Secondary | ICD-10-CM | POA: Diagnosis not present

## 2022-11-12 DIAGNOSIS — I10 Essential (primary) hypertension: Secondary | ICD-10-CM | POA: Diagnosis not present

## 2022-11-12 DIAGNOSIS — N1831 Chronic kidney disease, stage 3a: Secondary | ICD-10-CM | POA: Diagnosis not present

## 2022-11-12 LAB — BASIC METABOLIC PANEL
Anion gap: 7 (ref 5–15)
BUN: 40 mg/dL — ABNORMAL HIGH (ref 8–23)
CO2: 22 mmol/L (ref 22–32)
Calcium: 8.4 mg/dL — ABNORMAL LOW (ref 8.9–10.3)
Chloride: 110 mmol/L (ref 98–111)
Creatinine, Ser: 2.15 mg/dL — ABNORMAL HIGH (ref 0.61–1.24)
GFR, Estimated: 31 mL/min — ABNORMAL LOW (ref >60)
Glucose, Bld: 100 mg/dL — ABNORMAL HIGH (ref 70–99)
Potassium: 4 mmol/L (ref 3.5–5.1)
Sodium: 139 mmol/L (ref 135–145)

## 2022-11-12 LAB — CBC
HCT: 31.8 % — ABNORMAL LOW (ref 39.0–52.0)
Hemoglobin: 10.4 g/dL — ABNORMAL LOW (ref 13.0–17.0)
MCH: 29.6 pg (ref 26.0–34.0)
MCHC: 32.7 g/dL (ref 30.0–36.0)
MCV: 90.6 fL (ref 80.0–100.0)
Platelets: 150 10*3/uL (ref 150–400)
RBC: 3.51 MIL/uL — ABNORMAL LOW (ref 4.22–5.81)
RDW: 14.9 % (ref 11.5–15.5)
WBC: 5.1 10*3/uL (ref 4.0–10.5)
nRBC: 0 % (ref 0.0–0.2)

## 2022-11-12 NOTE — Progress Notes (Addendum)
Occupational Therapy Treatment Patient Details Name: Alexander Austin MRN: [id number] DOB: [birth year] Today's Date: 11/12/2022   History of present illness Pt is a 76 y.o. male admitted 11/07/22 with dyspnea. Workup for acute on chronic CHF. Of note, recent admission 08/2022 with age-indeterminate L frontal infarct. Other PMH includes CHF, CAD s/p CABG, pacemaker, afib, pulmonary HTN, CKD 4, renal cell CA s/p nephrectomy.   OT comments  Patient supine in bed, agreeable to OT.  NT planning to assist with hygiene supine, but pt agreeable to attempt OOB.  Upon standing at EOB with min guard, pt aware of need to get to restroom but urgency causing incontinence of BM.  Min guard functional mobility using RW, several transfers from regular commode using grab bars with min guard to mod assist as pt fatigues.  He requires total assist for hygiene and clean up, change of socks.  VSS during session on 2L [place], but visibly fatigued at end of session and requesting to return to bed. Pt agreeable to OOB to chair later today.  Will follow acutely.  Continue to recommend [place], as pt has aide assist at home.    Recommendations for follow up therapy are one component of a multi-disciplinary discharge planning process, led by the attending physician.  Recommendations may be updated based on patient status, additional functional criteria and insurance authorization.    Follow Up Recommendations  Home health OT     Assistance Recommended at Discharge Intermittent Supervision/Assistance  Patient can return home with the following  A little help with walking and/or transfers;Direct supervision/assist for medications management;Direct supervision/assist for financial management;Help with stairs or ramp for entrance;Assist for transportation;Assistance with cooking/housework;A lot of help with bathing/dressing/bathroom   Equipment Recommendations  None recommended by OT    Recommendations for Other Services       Precautions / Restrictions Precautions Precautions: Fall Restrictions Weight Bearing Restrictions: No       Mobility Bed Mobility Overal bed mobility: Needs Assistance Bed Mobility: Supine to Sit     Supine to sit: Min assist Sit to supine: Min assist   General bed mobility comments: trunk support with HOB elevated and cueing for technique    Transfers Overall transfer level: Needs assistance Equipment used: Rolling walker (2 wheels) Transfers: Sit to/from Stand Sit to Stand: Min guard, Mod assist           General transfer comment: min guard for inital stand from EOB, but as fatigues requires up to mod assist from regular commode     Balance Overall balance assessment: Needs assistance   Sitting balance-Leahy Scale: Good     Standing balance support: No upper extremity supported, During functional activity, Bilateral upper extremity supported Standing balance-Leahy Scale: Fair Standing balance comment: relies on BUE support dynamically when fatigued                           ADL either performed or assessed with clinical judgement   ADL Overall ADL's : Needs assistance/impaired     Grooming: Set up;Sitting               Lower Body Dressing: Minimal assistance;Maximal assistance;Sit to/from stand Lower Body Dressing Details (indicate cue type and reason): donning slippers and standing with min guard, but due to incontience required total assist to don socks Toilet Transfer: Ambulation;Rolling walker (2 wheels);Grab bars;Regular Toilet;Moderate assistance Toilet Transfer Details (indicate cue type and reason): to/from toilet with several sit to stands, initally min  assist fading to mod assist as fatigues Toileting- Clothing Manipulation and Hygiene: Total assistance;Sit to/from stand Toileting - Clothing Manipulation Details (indicate cue type and reason): posterior pericare, due to fatigue     Functional mobility during ADLs: Min  guard;Rolling walker (2 wheels)      Extremity/Trunk Assessment              Vision       Perception     Praxis      Cognition Arousal/Alertness: Awake/alert Behavior During Therapy: WFL for tasks assessed/performed Overall Cognitive Status: Impaired/Different from baseline Area of Impairment: Awareness, Problem solving                           Awareness: Emergent Problem Solving: Slow processing, Requires verbal cues General Comments: pt incontinent of bowel upon entry, but able to report needing to get to the bathroom during session.  He demonstrates some slow problem solving and requires verbal cueing.        Exercises      Shoulder Instructions       General Comments VSS on 2L O2    Pertinent Vitals/ Pain       Pain Assessment Pain Assessment: No/denies pain  Home Living                                          Prior Functioning/Environment              Frequency  Min 2X/week        Progress Toward Goals  OT Goals(current goals can now be found in the care plan section)  Progress towards OT goals: Progressing toward goals  Acute Rehab OT Goals Patient Stated Goal: none stated Time For Goal Achievement: 11/24/22 Potential to Achieve Goals: Good  Plan Discharge plan remains appropriate;Frequency remains appropriate    Co-evaluation                 AM-PAC OT "6 Clicks" Daily Activity     Outcome Measure   Help from another person eating meals?: None Help from another person taking care of personal grooming?: A Little Help from another person toileting, which includes using toliet, bedpan, or urinal?: A Little Help from another person bathing (including washing, rinsing, drying)?: A Little Help from another person to put on and taking off regular upper body clothing?: A Little Help from another person to put on and taking off regular lower body clothing?: A Little 6 Click Score: 19    End of  Session Equipment Utilized During Treatment: Rolling walker (2 wheels);Oxygen  OT Visit Diagnosis: Unsteadiness on feet (R26.81);Muscle weakness (generalized) (M62.81);Other abnormalities of gait and mobility (R26.89)   Activity Tolerance Patient tolerated treatment well   Patient Left in bed;with call bell/phone within reach;with bed alarm set   Nurse Communication Mobility status        Time: 3903-0092 OT Time Calculation (min): 35 min  Charges: OT General Charges $OT Visit: 1 Visit OT Treatments $Self Care/Home Management : 23-37 mins  [place] Office [phone]   Delight Stare 11/12/2022, 11:55 AM

## 2022-11-12 NOTE — Progress Notes (Signed)
PROGRESS NOTE    Alexander Austin  GYJ:[id number] DOB: 06-18-1946 DOA: 11/07/2022 PCP: Windy Fast, MD  76/M w/ history of P.Afib, hypertension, CVA, recent CEA, chronic diastolic CHF and obesity who presented with dyspnea. Reported 2 days of dyspnea, with orthopnea, PND, edema.  - 02 saturation 94% on 5 L/min ,  cr 1.3, BNP 736, troponin 21 and 20  -CXR w/ cardiomegaly, bilateral hilar vascular congestion -Started on diuretics, clinically improving, then worsening AKI  Subjective: -Feels fair, still on oxygen, denies any dyspnea  Assessment and Plan:  Acute on chronic diastolic CHF -Echo EF 50 to 26%, RV systolic function preserved, grade 2 diastolic dysfunction, LA with severe dilatation, -Diuresed with IV Lasix, he is 4.3 L negative, creatinine bumped to 2 yesterday, now plateauing, anticipate improvement  -Hold IV Lasix again today -GDMT limited by AKI -avoid SGLT2i w/ history of urinary retention -Increase activity, PT eval, out of bed to chair   Essential hypertension, benign Continue  amlodipine, hydralazine and carvedilol -meds as above   PAF (paroxysmal atrial fibrillation) (HCC) -carvedilol and apixaban   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 Hypomagnesemia  -Baseline serum cr is 1.6 -Creatinine went up to 2.1, renal ultrasound without hydronephrosis, holding diuretics 1 more day   Status post carotid endarterectomy H/o CVA -he uses a walker for ambulation.  CEA last month Continue with clopidogrel and statin therapy  Continue with edoxaban.    Obesity (BMI 30.0-34.9) Calculated BMI 31.9 consistent with obesity class 1   Urinary retention -Does not have a Foley at this time, urinating without issues reportedly, renal ultrasound without hydronephrosis   DVT prophylaxis:edoxaban Code Status: Limited Code Family Communication: None present Disposition Plan: Home likely 1 to 2 days  Consultants:    Procedures:   Antimicrobials:     Objective: Vitals:   11/11/22 2203 11/12/22 0613 11/12/22 0700 11/12/22 0840  BP: (!) 172/59 (!) 154/63 (!) 151/55   Pulse: 64 (!) 50 (!) 50 (!) 54  Resp: '20 18 17   '$ Temp: 98.5 F (36.9 C) 97.9 F (36.6 C) 98.1 F (36.7 C)   TempSrc: Oral Oral Oral   SpO2: 92% 97% 93%   Weight:  108.9 kg    Height:        Intake/Output Summary (Last 24 hours) at 11/12/2022 1242 Last data filed at 11/12/2022 0630 Gross per 24 hour  Intake --  Output 350 ml  Net -350 ml   Filed Weights   11/10/22 0549 11/11/22 0514 11/12/22 3785  Weight: 109.8 kg 109.2 kg 108.9 kg    Examination:  General exam: Obese pleasant male laying in bed, AAOx3, mild cognitive deficits HEENT: Neck obese unable to assess JVD CVS: S1-S2, regular rhythm Lungs: Few basilar rales Abdomen: Soft, nontender, bowel sounds present Extremities: No edema  Skin: No rashes Psychiatry:  Mood & affect appropriate.     Data Reviewed:   CBC: Recent Labs  Lab 11/07/22 2328 11/12/22 0618  WBC 4.3 5.1  HGB 11.0* 10.4*  HCT 34.9* 31.8*  MCV 93.3 90.6  PLT 171 885   Basic Metabolic Panel: Recent Labs  Lab 11/07/22 2328 11/09/22 0155 11/10/22 0330 11/11/22 0126 11/12/22 0618  NA 145 145 143 139 139  K 3.6 2.9* 3.9 3.3* 4.0  CL 113* 108 111 105 110  CO2 '23 25 26 22 22  '$ GLUCOSE 104* 122* 97 122* 100*  BUN 23 24* 28* 37* 40*  CREATININE 1.36* 1.59* 1.86* 2.08* 2.15*  CALCIUM 9.2 8.4*  8.4* 8.9 8.4*  MG  --  1.8 2.5*  --   --    GFR: Estimated Creatinine Clearance: 37.2 mL/min (A) (by C-G formula based on SCr of 2.15 mg/dL (H)). Liver Function Tests: Recent Labs  Lab 11/07/22 2328  AST 17  ALT 16  ALKPHOS 89  BILITOT 0.9  PROT 5.7*  ALBUMIN 3.2*   No results for input(s): "LIPASE", "AMYLASE" in the last 168 hours. No results for input(s): "AMMONIA" in the last 168 hours. Coagulation Profile: Recent Labs  Lab 11/07/22 2328  INR 1.2   Cardiac Enzymes: No results for input(s): "CKTOTAL",  "CKMB", "CKMBINDEX", "TROPONINI" in the last 168 hours. BNP (last 3 results) No results for input(s): "PROBNP" in the last 8760 hours. HbA1C: No results for input(s): "HGBA1C" in the last 72 hours. CBG: No results for input(s): "GLUCAP" in the last 168 hours. Lipid Profile: No results for input(s): "CHOL", "HDL", "LDLCALC", "TRIG", "CHOLHDL", "LDLDIRECT" in the last 72 hours. Thyroid Function Tests: No results for input(s): "TSH", "T4TOTAL", "FREET4", "T3FREE", "THYROIDAB" in the last 72 hours. Anemia Panel: No results for input(s): "VITAMINB12", "FOLATE", "FERRITIN", "TIBC", "IRON", "RETICCTPCT" in the last 72 hours. Urine analysis:    Component Value Date/Time   COLORURINE YELLOW 09/14/2022 [place] 09/14/2022 0553   LABSPEC 1.010 09/14/2022 0553   PHURINE 5.5 09/14/2022 [place] 09/14/2022 0553   HGBUR NEGATIVE 09/14/2022 0553   BILIRUBINUR NEGATIVE 09/14/2022 0553   KETONESUR NEGATIVE 09/14/2022 0553   PROTEINUR NEGATIVE 09/14/2022 0553   NITRITE NEGATIVE 09/14/2022 0553   LEUKOCYTESUR NEGATIVE 09/14/2022 0553   Sepsis Labs: '@LABRCNTIP'$ (procalcitonin:4,lacticidven:4)  )No results found for this or any previous visit (from the past 240 hour(s)).   Radiology Studies: No results found.   Scheduled Meds:  amLODipine  10 mg Oral Daily   atorvastatin  80 mg Oral QHS   calcitRIOL  0.25 mcg Oral Daily   carvedilol  12.5 mg Oral BID WC   clopidogrel  75 mg Oral Daily   docusate sodium  200 mg Oral Daily   edoxaban  30 mg Oral Q24H   escitalopram  20 mg Oral QHS   febuxostat  80 mg Oral Daily   hydrALAZINE  50 mg Oral TID   pantoprazole  40 mg Oral QAC breakfast   primidone  100 mg Oral QHS   sodium chloride flush  3 mL Intravenous Q12H   [START ON 11/14/2022] Vitamin D (Ergocalciferol)  50,000 Units Oral Q7 days   Continuous Infusions:  sodium chloride       LOS: 4 days    Time spent: 50mn    PDomenic Polite MD [facility]   11/12/2022, 12:42 PM

## 2022-11-12 NOTE — Progress Notes (Signed)
Physical Therapy Treatment Patient Details Name: Alexander Austin MRN: [id number] DOB: [birth year] Today's Date: 11/12/2022   History of Present Illness Pt is a 76 y.o. male admitted 11/07/22 with dyspnea. Workup for acute on chronic CHF. Of note, recent admission 08/2022 with age-indeterminate L frontal infarct. Other PMH includes CHF, CAD s/p CABG, pacemaker, afib, pulmonary HTN, CKD 4, renal cell CA s/p nephrectomy.    PT Comments    Pt was fairly tired today after a session with OT in the AM, now able to do bed exercises with cued assistance and able to shift posture in bed.  Attempted to pull up to top of bed and pt did not feel comfortable with flattening out bed, so repositioned with better posture for comfort.  Pt is maintaining sats in mid 90's at 94% average, no signs of resp distress to exercise.  Fatigue being the main issue, will recommend him to go home with HHPT for further strengthening and family support.  Encourage OOB to chair as tolerated and walking with RW for strengthening.   Recommendations for follow up therapy are one component of a multi-disciplinary discharge planning process, led by the attending physician.  Recommendations may be updated based on patient status, additional functional criteria and insurance authorization.  Follow Up Recommendations  Home health PT     Assistance Recommended at Discharge PRN  Patient can return home with the following A little help with bathing/dressing/bathroom;Assist for transportation;Assistance with cooking/housework   Equipment Recommendations  None recommended by PT    Recommendations for Other Services       Precautions / Restrictions Precautions Precautions: Fall Restrictions Weight Bearing Restrictions: No     Mobility  Bed Mobility               General bed mobility comments: declined OOB    Transfers                        Ambulation/Gait                   Stairs              Wheelchair Mobility    Modified Rankin (Stroke Patients Only)       Balance                                            Cognition Arousal/Alertness: Awake/alert, Lethargic Behavior During Therapy: WFL for tasks assessed/performed Overall Cognitive Status: Impaired/Different from baseline Area of Impairment: Awareness, Problem solving                           Awareness: Intellectual Problem Solving: Slow processing, Requires verbal cues, Requires tactile cues          Exercises General Exercises - Lower Extremity Ankle Circles/Pumps: AROM, AAROM, 5 reps Quad Sets: AROM, 5 reps Gluteal Sets: AROM, 10 reps Heel Slides: AROM, AAROM, 10 reps Hip ABduction/ADduction: AROM, AAROM, 10 reps Straight Leg Raises: AAROM, 5 reps    General Comments General comments (skin integrity, edema, etc.): agreed to exercise as he is still worn out from AM session but has not had PT in a few days      Pertinent Vitals/Pain Pain Assessment Pain Assessment: No/denies pain    Home Living  Prior Function            PT Goals (current goals can now be found in the care plan section) Acute Rehab PT Goals Patient Stated Goal: home and stronger    Frequency    Min 3X/week      PT Plan Current plan remains appropriate    Co-evaluation              AM-PAC PT "6 Clicks" Mobility   Outcome Measure  Help needed turning from your back to your side while in a flat bed without using bedrails?: A Little Help needed moving from lying on your back to sitting on the side of a flat bed without using bedrails?: A Little Help needed moving to and from a bed to a chair (including a wheelchair)?: A Little Help needed standing up from a chair using your arms (e.g., wheelchair or bedside chair)?: A Little Help needed to walk in hospital room?: A Little Help needed climbing 3-5 steps with a railing? : A Little 6 Click  Score: 18    End of Session   Activity Tolerance: Patient limited by fatigue Patient left: in bed;with call bell/phone within reach;with bed alarm set Nurse Communication: Mobility status PT Visit Diagnosis: Other abnormalities of gait and mobility (R26.89)     Time: 6837-2902 PT Time Calculation (min) (ACUTE ONLY): 15 min  Charges:  $Therapeutic Exercise: 8-22 mins   Ramond Dial 11/12/2022, 4:30 PM  Mee Hives, PT PhD Acute Rehab Dept. Number: [place] [facility] [place]

## 2022-11-12 NOTE — Plan of Care (Signed)
  Problem: Education: Goal: Knowledge of General Education information will improve Description: Including pain rating scale, medication(s)/side effects and non-pharmacologic comfort measures Outcome: Progressing   Problem: Health Behavior/Discharge Planning: Goal: Ability to manage health-related needs will improve Outcome: Progressing   Problem: Clinical Measurements: Goal: Respiratory complications will improve Outcome: Progressing   Problem: Clinical Measurements: Goal: Cardiovascular complication will be avoided Outcome: Progressing   Problem: Activity: Goal: Risk for activity intolerance will decrease Outcome: Progressing   Problem: Nutrition: Goal: Adequate nutrition will be maintained Outcome: Progressing   Problem: Coping: Goal: Level of anxiety will decrease Outcome: Progressing   Problem: Pain Managment: Goal: General experience of comfort will improve Outcome: Progressing   Problem: Safety: Goal: Ability to remain free from injury will improve Outcome: Progressing   Problem: Cardiac: Goal: Ability to achieve and maintain adequate cardiopulmonary perfusion will improve Outcome: Progressing

## 2022-11-13 DIAGNOSIS — I5033 Acute on chronic diastolic (congestive) heart failure: Secondary | ICD-10-CM | POA: Diagnosis not present

## 2022-11-13 LAB — BASIC METABOLIC PANEL
Anion gap: 9 (ref 5–15)
BUN: 39 mg/dL — ABNORMAL HIGH (ref 8–23)
CO2: 24 mmol/L (ref 22–32)
Calcium: 9.3 mg/dL (ref 8.9–10.3)
Chloride: 110 mmol/L (ref 98–111)
Creatinine, Ser: 1.88 mg/dL — ABNORMAL HIGH (ref 0.61–1.24)
GFR, Estimated: 36 mL/min — ABNORMAL LOW (ref >60)
Glucose, Bld: 92 mg/dL (ref 70–99)
Potassium: 3.8 mmol/L (ref 3.5–5.1)
Sodium: 143 mmol/L (ref 135–145)

## 2022-11-13 LAB — MAGNESIUM: Magnesium: 2.5 mg/dL — ABNORMAL HIGH (ref 1.7–2.4)

## 2022-11-13 MED ORDER — FUROSEMIDE 40 MG PO TABS
40.0000 mg | ORAL_TABLET | Freq: Every day | ORAL | Status: DC
  Administered 2022-11-13 – 2022-11-15 (×3): 40 mg via ORAL
  Filled 2022-11-13 (×3): qty 1

## 2022-11-13 MED ORDER — POTASSIUM CHLORIDE 20 MEQ PO PACK
40.0000 meq | PACK | Freq: Once | ORAL | Status: AC
  Administered 2022-11-13: 40 meq via ORAL
  Filled 2022-11-13: qty 2

## 2022-11-13 NOTE — Progress Notes (Signed)
CCMD called and pt had another 5 beat run of vtach at 1750 but is asymptomatic. Nightshift nurse made aware and will be paging on-call MD.

## 2022-11-13 NOTE — Progress Notes (Signed)
PROGRESS NOTE    Alexander Austin  [id number]:[id number] DOB: 12-13-1946 DOA: 11/07/2022 PCP: Windy Fast, MD  76/M w/ history of P.Afib, hypertension, CVA, recent CEA, chronic diastolic CHF and obesity who presented with dyspnea. Reported 2 days of dyspnea, with orthopnea, PND, edema.  - 02 saturation 94% on 5 L/min ,  cr 1.3, BNP 736, troponin 21 and 20  -CXR w/ cardiomegaly, bilateral hilar vascular congestion -Started on diuretics, clinically improving, then worsening AKI  Subjective: -Feels okay overall, breathing better, still on oxygen  Assessment and Plan:  Acute on chronic diastolic CHF -Echo EF 50 to 44%, RV systolic function preserved, grade 2 diastolic dysfunction, LA with severe dilatation, -Diuresed with IV Lasix, he is 4.3 L negative, creatinine bumped to 2 , now improving, restart oral Lasix today -GDMT limited by AKI -avoid SGLT2i w/ history of urinary retention -Increase activity, PT eval, out of bed to chair   Essential hypertension, benign Continue  amlodipine, hydralazine and carvedilol -meds as above   PAF (paroxysmal atrial fibrillation) (HCC) -carvedilol and apixaban   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 Hypomagnesemia  -Baseline serum cr is 1.6 -Creatinine went up to 2.1, renal ultrasound without hydronephrosis, now improving, restarting oral Lasix   Status post carotid endarterectomy H/o CVA -he uses a walker for ambulation.  CEA last month Continue with clopidogrel and statin therapy  Continue with edoxaban.    Obesity (BMI 30.0-34.9) Calculated BMI 31.9 consistent with obesity class 1   Urinary retention -Does not have a Foley at this time, urinating without issues reportedly, renal ultrasound without hydronephrosis   DVT prophylaxis:edoxaban Code Status: Limited Code Family Communication: None present Disposition Plan: Home likely 1 to 2 days  Consultants:    Procedures:   Antimicrobials:    Objective: Vitals:    11/13/22 0700 11/13/22 0939 11/13/22 1300 11/13/22 1303  BP: (!) 153/65 (!) 165/66    Pulse: (!) 54 60    Resp:      Temp: 98.1 F (36.7 C)     TempSrc: Oral     SpO2: 94%  99% 96%  Weight:      Height:        Intake/Output Summary (Last 24 hours) at 11/13/2022 1354 Last data filed at 11/13/2022 8185 Gross per 24 hour  Intake 600 ml  Output 800 ml  Net -200 ml   Filed Weights   11/11/22 0514 11/12/22 0613 11/13/22 0434  Weight: 109.2 kg 108.9 kg 109.4 kg    Examination:  General exam: Obese elderly male laying in bed, AAOx3, mild cognitive deficits HEENT: Neck obese unable to assess JVD CVS: S1-S2, regular rhythm Lungs: Few basilar rales otherwise clear Abdomen: Soft, nontender, bowel sounds present Extremities: No edema   Skin: No rashes Psychiatry:  Mood & affect appropriate.     Data Reviewed:   CBC: Recent Labs  Lab 11/07/22 2328 11/12/22 0618  WBC 4.3 5.1  HGB 11.0* 10.4*  HCT 34.9* 31.8*  MCV 93.3 90.6  PLT 171 631   Basic Metabolic Panel: Recent Labs  Lab 11/09/22 0155 11/10/22 0330 11/11/22 0126 11/12/22 0618 11/13/22 0155  NA 145 143 139 139 143  K 2.9* 3.9 3.3* 4.0 3.8  CL 108 111 105 110 110  CO2 '25 26 22 22 24  '$ GLUCOSE 122* 97 122* 100* 92  BUN 24* 28* 37* 40* 39*  CREATININE 1.59* 1.86* 2.08* 2.15* 1.88*  CALCIUM 8.4* 8.4* 8.9 8.4* 9.3  MG 1.8 2.5*  --   --  2.5*   GFR: Estimated Creatinine Clearance: 42.7 mL/min (A) (by C-G formula based on SCr of 1.88 mg/dL (H)). Liver Function Tests: Recent Labs  Lab 11/07/22 2328  AST 17  ALT 16  ALKPHOS 89  BILITOT 0.9  PROT 5.7*  ALBUMIN 3.2*   No results for input(s): "LIPASE", "AMYLASE" in the last 168 hours. No results for input(s): "AMMONIA" in the last 168 hours. Coagulation Profile: Recent Labs  Lab 11/07/22 2328  INR 1.2   Cardiac Enzymes: No results for input(s): "CKTOTAL", "CKMB", "CKMBINDEX", "TROPONINI" in the last 168 hours. BNP (last 3 results) No results for  input(s): "PROBNP" in the last 8760 hours. HbA1C: No results for input(s): "HGBA1C" in the last 72 hours. CBG: No results for input(s): "GLUCAP" in the last 168 hours. Lipid Profile: No results for input(s): "CHOL", "HDL", "LDLCALC", "TRIG", "CHOLHDL", "LDLDIRECT" in the last 72 hours. Thyroid Function Tests: No results for input(s): "TSH", "T4TOTAL", "FREET4", "T3FREE", "THYROIDAB" in the last 72 hours. Anemia Panel: No results for input(s): "VITAMINB12", "FOLATE", "FERRITIN", "TIBC", "IRON", "RETICCTPCT" in the last 72 hours. Urine analysis:    Component Value Date/Time   COLORURINE YELLOW 09/14/2022 [place] 09/14/2022 0553   LABSPEC 1.010 09/14/2022 0553   PHURINE 5.5 09/14/2022 [place] 09/14/2022 0553   HGBUR NEGATIVE 09/14/2022 0553   BILIRUBINUR NEGATIVE 09/14/2022 0553   KETONESUR NEGATIVE 09/14/2022 0553   PROTEINUR NEGATIVE 09/14/2022 0553   NITRITE NEGATIVE 09/14/2022 0553   LEUKOCYTESUR NEGATIVE 09/14/2022 0553   Sepsis Labs: '@LABRCNTIP'$ (procalcitonin:4,lacticidven:4)  )No results found for this or any previous visit (from the past 240 hour(s)).   Radiology Studies: No results found.   Scheduled Meds:  amLODipine  10 mg Oral Daily   atorvastatin  80 mg Oral QHS   calcitRIOL  0.25 mcg Oral Daily   carvedilol  12.5 mg Oral BID WC   clopidogrel  75 mg Oral Daily   docusate sodium  200 mg Oral Daily   edoxaban  30 mg Oral Q24H   escitalopram  20 mg Oral QHS   febuxostat  80 mg Oral Daily   furosemide  40 mg Oral Daily   hydrALAZINE  50 mg Oral TID   pantoprazole  40 mg Oral QAC breakfast   primidone  100 mg Oral QHS   sodium chloride flush  3 mL Intravenous Q12H   [START ON 11/14/2022] Vitamin D (Ergocalciferol)  50,000 Units Oral Q7 days   Continuous Infusions:  sodium chloride       LOS: 5 days    Time spent: 89mn    PDomenic Polite MD [facility]   11/13/2022, 1:54 PM

## 2022-11-13 NOTE — Progress Notes (Signed)
CCMD called and stated pt had a 5 beat run of vtach around 1330. MD made aware. No new orders, will continue to monitor.

## 2022-11-14 DIAGNOSIS — I5033 Acute on chronic diastolic (congestive) heart failure: Secondary | ICD-10-CM | POA: Diagnosis not present

## 2022-11-14 LAB — BASIC METABOLIC PANEL
Anion gap: 4 — ABNORMAL LOW (ref 5–15)
BUN: 35 mg/dL — ABNORMAL HIGH (ref 8–23)
CO2: 24 mmol/L (ref 22–32)
Calcium: 8.7 mg/dL — ABNORMAL LOW (ref 8.9–10.3)
Chloride: 111 mmol/L (ref 98–111)
Creatinine, Ser: 1.76 mg/dL — ABNORMAL HIGH (ref 0.61–1.24)
GFR, Estimated: 39 mL/min — ABNORMAL LOW (ref >60)
Glucose, Bld: 104 mg/dL — ABNORMAL HIGH (ref 70–99)
Potassium: 4 mmol/L (ref 3.5–5.1)
Sodium: 139 mmol/L (ref 135–145)

## 2022-11-14 NOTE — Progress Notes (Signed)
Mobility Specialist Progress Note    11/14/22 1220  Mobility  Activity Transferred to/from [facility]  Level of Assistance Moderate assist, patient does 50-74%  Assistive Device Front wheel walker  Activity Response Tolerated well  Mobility Referral Yes  $Mobility charge 1 Mobility   Pre-Mobility: 64 HR  Pt received in chair and requested to be cleaned up after BM. Completed multiple STS with min-modA for multiple BM and pericare. Towards end, c/o starting to feel tired. Deferred further ambulation and left in chair with call bell in reach. RN aware.   Hildred Alamin Mobility Specialist  Please Psychologist, sport and exercise or Rehab Office at [phone]

## 2022-11-14 NOTE — Progress Notes (Signed)
PROGRESS NOTE    Alexander Austin  [id number] DOB: 1946/12/16 DOA: 11/07/2022 PCP: Windy Fast, MD  76/M w/ history of P.Afib, hypertension, CVA, recent CEA, chronic diastolic CHF and obesity who presented with dyspnea. Reported 2 days of dyspnea, with orthopnea, PND, edema.  - 02 saturation 94% on 5 L/min ,  cr 1.3, BNP 736, troponin 21 and 20  -CXR w/ cardiomegaly, bilateral hilar vascular congestion -Started on diuretics, clinically improving, then worsening AKI  Subjective: -Feels okay overall, no events overnight, breathing better, off oxygen  Assessment and Plan:  Acute hypoxic respiratory failure Acute on chronic diastolic CHF -Echo EF 50 to 07%, RV systolic function preserved, grade 2 diastolic dysfunction, LA with severe dilatation, -Diuresed with IV Lasix, he is 5.6 L negative, readmission bump to 2.1, diuretics held, now improving restarted oral Lasix yesterday -GDMT limited by AKI, add Aldactone tomorrow if creatinine continues to improve -avoid SGLT2i w/ history of urinary retention -Increase activity, PT eval, out of bed to chair, weaned off O2 -Discharge planning, will benefit from [facility] follow[facility], has limited social support and insight   Essential hypertension, benign Continue  amlodipine, hydralazine and carvedilol -meds as above   PAF (paroxysmal atrial fibrillation) (HCC) -carvedilol and apixaban   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 Hypomagnesemia  -Baseline serum cr is 1.6 -Creatinine went up to 2.1, renal ultrasound without hydronephrosis, now improving, now improving, continue oral Lasix   Status post carotid endarterectomy H/o CVA -he uses a walker for ambulation.  CEA last month Continue with clopidogrel and statin therapy  Continue with edoxaban.    Obesity (BMI 30.0-34.9) Calculated BMI 31.9 consistent with obesity class 1   Urinary retention -Does not have a Foley at this time, urinating without issues reportedly,  renal ultrasound without hydronephrosis   DVT prophylaxis:edoxaban Code Status: Limited Code Family Communication: None present Disposition Plan: Home likely 1 to 2 days  Consultants:    Procedures:   Antimicrobials:    Objective: Vitals:   11/13/22 1613 11/13/22 1928 11/14/22 0439 11/14/22 1035  BP: (!) 146/63 138/61 (!) 151/64 (!) 169/73  Pulse: 60 60 61   Resp:  19 19   Temp:  98.7 F (37.1 C) 97.8 F (36.6 C)   TempSrc:  Oral Oral   SpO2:  94% 94% 93%  Weight:   109.3 kg   Height:        Intake/Output Summary (Last 24 hours) at 11/14/2022 1136 Last data filed at 11/14/2022 1107 Gross per 24 hour  Intake 360 ml  Output 1602 ml  Net -1242 ml   Filed Weights   11/12/22 0613 11/13/22 0434 11/14/22 0439  Weight: 108.9 kg 109.4 kg 109.3 kg    Examination:  General exam: Obese elderly male laying in bed, AAOx3, mild cognitive deficits HEENT: Neck obese unable to assess JVD CVS: S1-S2, regular rhythm Lungs: Few basilar rales otherwise clear Abdomen: Soft, nontender, bowel sounds present Extremities: No edema   Skin: No rashes Psychiatry:  Mood & affect appropriate.     Data Reviewed:   CBC: Recent Labs  Lab 11/07/22 2328 11/12/22 0618  WBC 4.3 5.1  HGB 11.0* 10.4*  HCT 34.9* 31.8*  MCV 93.3 90.6  PLT 171 371   Basic Metabolic Panel: Recent Labs  Lab 11/09/22 0155 11/10/22 0330 11/11/22 0126 11/12/22 0618 11/13/22 0155 11/14/22 0205  NA 145 143 139 139 143 139  K 2.9* 3.9 3.3* 4.0 3.8 4.0  CL 108 111 105 110 110 111  CO2 '25 26 22 22 24 24  '$ GLUCOSE 122* 97 122* 100* 92 104*  BUN 24* 28* 37* 40* 39* 35*  CREATININE 1.59* 1.86* 2.08* 2.15* 1.88* 1.76*  CALCIUM 8.4* 8.4* 8.9 8.4* 9.3 8.7*  MG 1.8 2.5*  --   --  2.5*  --    GFR: Estimated Creatinine Clearance: 45.6 mL/min (A) (by C-G formula based on SCr of 1.76 mg/dL (H)). Liver Function Tests: Recent Labs  Lab 11/07/22 2328  AST 17  ALT 16  ALKPHOS 89  BILITOT 0.9  PROT 5.7*   ALBUMIN 3.2*   No results for input(s): "LIPASE", "AMYLASE" in the last 168 hours. No results for input(s): "AMMONIA" in the last 168 hours. Coagulation Profile: Recent Labs  Lab 11/07/22 2328  INR 1.2   Cardiac Enzymes: No results for input(s): "CKTOTAL", "CKMB", "CKMBINDEX", "TROPONINI" in the last 168 hours. BNP (last 3 results) No results for input(s): "PROBNP" in the last 8760 hours. HbA1C: No results for input(s): "HGBA1C" in the last 72 hours. CBG: No results for input(s): "GLUCAP" in the last 168 hours. Lipid Profile: No results for input(s): "CHOL", "HDL", "LDLCALC", "TRIG", "CHOLHDL", "LDLDIRECT" in the last 72 hours. Thyroid Function Tests: No results for input(s): "TSH", "T4TOTAL", "FREET4", "T3FREE", "THYROIDAB" in the last 72 hours. Anemia Panel: No results for input(s): "VITAMINB12", "FOLATE", "FERRITIN", "TIBC", "IRON", "RETICCTPCT" in the last 72 hours. Urine analysis:    Component Value Date/Time   COLORURINE YELLOW 09/14/2022 [place] 09/14/2022 0553   LABSPEC 1.010 09/14/2022 0553   PHURINE 5.5 09/14/2022 [place] 09/14/2022 0553   HGBUR NEGATIVE 09/14/2022 0553   BILIRUBINUR NEGATIVE 09/14/2022 0553   KETONESUR NEGATIVE 09/14/2022 0553   PROTEINUR NEGATIVE 09/14/2022 0553   NITRITE NEGATIVE 09/14/2022 0553   LEUKOCYTESUR NEGATIVE 09/14/2022 0553   Sepsis Labs: '@LABRCNTIP'$ (procalcitonin:4,lacticidven:4)  )No results found for this or any previous visit (from the past 240 hour(s)).   Radiology Studies: No results found.   Scheduled Meds:  amLODipine  10 mg Oral Daily   atorvastatin  80 mg Oral QHS   calcitRIOL  0.25 mcg Oral Daily   carvedilol  12.5 mg Oral BID WC   clopidogrel  75 mg Oral Daily   docusate sodium  200 mg Oral Daily   edoxaban  30 mg Oral Q24H   escitalopram  20 mg Oral QHS   febuxostat  80 mg Oral Daily   furosemide  40 mg Oral Daily   hydrALAZINE  50 mg Oral TID   pantoprazole  40 mg  Oral QAC breakfast   primidone  100 mg Oral QHS   sodium chloride flush  3 mL Intravenous Q12H   Vitamin D (Ergocalciferol)  50,000 Units Oral Q7 days   Continuous Infusions:  sodium chloride       LOS: 6 days    Time spent: 55mn    PDomenic Polite MD [facility]   11/14/2022, 11:36 AM

## 2022-11-15 ENCOUNTER — Other Ambulatory Visit (HOSPITAL_COMMUNITY): Payer: Self-pay

## 2022-11-15 DIAGNOSIS — I5033 Acute on chronic diastolic (congestive) heart failure: Secondary | ICD-10-CM | POA: Diagnosis not present

## 2022-11-15 LAB — BASIC METABOLIC PANEL
Anion gap: 10 (ref 5–15)
BUN: 33 mg/dL — ABNORMAL HIGH (ref 8–23)
CO2: 22 mmol/L (ref 22–32)
Calcium: 8.9 mg/dL (ref 8.9–10.3)
Chloride: 107 mmol/L (ref 98–111)
Creatinine, Ser: 1.8 mg/dL — ABNORMAL HIGH (ref 0.61–1.24)
GFR, Estimated: 38 mL/min — ABNORMAL LOW (ref >60)
Glucose, Bld: 84 mg/dL (ref 70–99)
Potassium: 3.6 mmol/L (ref 3.5–5.1)
Sodium: 139 mmol/L (ref 135–145)

## 2022-11-15 MED ORDER — COLCHICINE 0.6 MG PO TABS: 0.3000 mg | ORAL_TABLET | ORAL | Status: AC

## 2022-11-15 MED ORDER — FUROSEMIDE 40 MG PO TABS
40.0000 mg | ORAL_TABLET | Freq: Two times a day (BID) | ORAL | 0 refills | Status: AC
  Filled 2022-11-15: qty 60, 30d supply, fill #0

## 2022-11-15 MED ORDER — POTASSIUM CHLORIDE CRYS ER 20 MEQ PO TBCR
20.0000 meq | EXTENDED_RELEASE_TABLET | Freq: Every day | ORAL | 0 refills | Status: AC
  Filled 2022-11-15: qty 30, 30d supply, fill #0

## 2022-11-15 NOTE — Discharge Summary (Signed)
Physician Discharge Summary  Alexander Austin FMB:[id number] DOB: Nov 12, 1946 DOA: 11/07/2022  PCP: Windy Fast, MD  Admit date: 11/07/2022 Discharge date: 11/15/2022  Time spent: 35 minutes  Recommendations for Outpatient Follow-up:  CHF TOC clinic follow-up 12/5 PCP and cardiology at the [facility] in 2 to 3 weeks Please check BMP in 1 week Home health PT OT RN, aide  Discharge Diagnoses:  Principal Problem:   Acute on chronic diastolic CHF (congestive heart failure) ([place]) Recent CVA Recent carotid endarterectomy   Essential hypertension, benign   PAF (paroxysmal atrial fibrillation) ([place])   Chronic kidney disease, stage 3a ([place])   Status post carotid endarterectomy   Obesity (BMI 30.0-34.9)   Urinary retention   Discharge Condition: Improved  Diet recommendation: Low-sodium, heart healthy  Filed Weights   11/13/22 0434 11/14/22 0439 11/15/22 0507  Weight: 109.4 kg 109.3 kg 105.8 kg    History of present illness:  76/M w/ history of P.Afib, hypertension, CVA, recent CEA, chronic diastolic CHF and obesity who presented with dyspnea. Reported 2 days of dyspnea, with orthopnea, PND, edema.  - 02 saturation 94% on 5 L/min ,  cr 1.3, BNP 736, troponin 21 and 20  -CXR w/ cardiomegaly, bilateral hilar vascular congestion -Started on diuretics, clinically improving, then worsening AKI  Hospital Course:   Acute hypoxic respiratory failure Acute on chronic diastolic CHF -Echo EF 50 to 70%, RV systolic function preserved, grade 2 diastolic dysfunction, LA with severe dilatation, -Diuresed with IV Lasix, he is 7.4 L negative, readmission bump to 2.1, diuretics held, now improving restarted oral Lasix yesterday -GDMT limited by AKI/CKD, start Aldactone as outpatient if kidney function continues to improve, continue Coreg and hydralazine -Improved and stable now, weaned off O2 -CHF TOC clinic follow-up arranged, he is also followed at the [place], has limited social support -Home health PT OT  RN and aide   Essential hypertension, benign Continue  amlodipine, hydralazine and carvedilol -meds as above   PAF (paroxysmal atrial fibrillation) ([place]) -Remains in sinus rhythm, continue carvedilol and apixaban   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 Hypomagnesemia  -Baseline serum cr is 1.6 -Creatinine went up to 2.1, renal ultrasound without hydronephrosis, now improving, now improving, continue oral Lasix   Status post carotid endarterectomy H/o CVA -he uses a walker for ambulation.  CEA last month-> continue clopidogrel and statin therapy  -With history of CVA and A-fib continue edoxaban.    Obesity (BMI 30.0-34.9) Calculated BMI 31.9 consistent with obesity class 1   Urinary retention -Does not have a Foley at this time, urinating without issues reportedly, renal ultrasound without hydronephrosis  Discharge Exam: Vitals:   11/15/22 0507 11/15/22 0949  BP: (!) 161/67 (!) 147/65  Pulse: (!) 57 60  Resp:    Temp: 98.4 F (36.9 C)   SpO2: 91%    General exam: Obese elderly male laying in bed, AAOx3, mild cognitive deficits HEENT: Neck obese unable to assess JVD CVS: S1-S2, regular rhythm Lungs: Few basilar rales otherwise clear Abdomen: Soft, nontender, bowel sounds present Extremities: No edema   Skin: No rashes Psychiatry:  Mood & affect appropriate.    Discharge Instructions   Discharge Instructions     Diet - low sodium heart healthy   Complete by: As directed    Increase activity slowly   Complete by: As directed       Allergies as of 11/15/2022       Reactions   Isosorbide Nitrate Anaphylaxis   Other reaction(s): Cardiovascular  Arrest (ALLERGY/intolerance) Can take Sublingual Nitro.   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   amLODipine 10 MG tablet Commonly known as: NORVASC Take 10 mg by mouth daily.   atorvastatin 80 MG tablet Commonly known as: LIPITOR Take 80 mg by mouth at bedtime.   calcitRIOL 0.25 MCG capsule Commonly known as: ROCALTROL Take 0.25 mcg by mouth daily.   carvedilol 12.5 MG tablet Commonly known as: COREG Take 1 tablet (12.5 mg total) by mouth 2 (two) times daily with a meal.   clopidogrel 75 MG tablet Commonly known as: PLAVIX Take 75 mg by mouth daily.   colchicine 0.6 MG tablet Take 0.5 tablets (0.3 mg total) by mouth See admin instructions. Take 0.'3mg'$  on Monday, Wednesday, Friday, and Sunday.   DSS 100 MG Caps Take 200 mg by mouth daily.   edoxaban 60 MG Tabs tablet Commonly known as: SAVAYSA Take 60 mg by mouth daily.   escitalopram 20 MG tablet Commonly known as: LEXAPRO Take 20 mg by mouth at bedtime.   Febuxostat 80 MG Tabs Take 80 mg by mouth daily.   furosemide 40 MG tablet Commonly known as: LASIX Take 1 tablet (40 mg total) by mouth 2 (two) times daily.   hydrALAZINE 50 MG tablet Commonly known as: APRESOLINE Take 50 mg by mouth 3 (three) times daily.   pantoprazole 40 MG tablet Commonly known as: PROTONIX Take 40 mg by mouth daily before breakfast.   potassium chloride SA 20 MEQ tablet Commonly known as: KLOR-CON M Take 1 tablet (20 mEq total) by mouth daily. Replaces: potassium chloride 10 MEQ tablet   primidone 50 MG tablet Commonly known as: MYSOLINE Take 100 mg by mouth in the morning and at bedtime.   traZODone 50 MG tablet Commonly known as: DESYREL Take 25 mg by mouth at  bedtime as needed for sleep.   Vitamin D (Ergocalciferol) 1.25 MG (50000 UNIT) Caps capsule Commonly known as: DRISDOL Take 50,000 Units by mouth every 7 (seven) days. Sunday       Allergies  Allergen Reactions   Isosorbide Nitrate Anaphylaxis    Other reaction(s): Cardiovascular Arrest (ALLERGY/intolerance) Can take Sublingual Nitro.    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 Go in 13 day(s).   Specialty: Cardiology Why: Hospital follow up PLEASE bring a current medication list to appointment FREE valet parking, Entrance C, off [employer] information: [address] [phone] [place], [facility] Follow up.   Specialty: Home Health Services Why: Physical and Occupational Therapy Contact information: [place] [address] [phone]  The results of significant diagnostics from this hospitalization (including imaging, microbiology, ancillary and laboratory) are listed below for reference.    Significant Diagnostic Studies: US RENAL  Result Date: 11/10/2022 CLINICAL DATA:  AKI EXAM: RENAL / URINARY TRACT ULTRASOUND COMPLETE COMPARISON:  Renal US 09/27/22 FINDINGS: Right Kidney: Renal measurements: 8.5 x 4.4 x 4.6 cm (previously 8.6 x 4.7 x 4.8 cm) = volume: 89 ml mL (previously 102 ml). Echogenicity slightly increased with thinning of the cortex. No mass or hydronephrosis visualized. Left Kidney: Renal measurements: 10 x 4.8 x 4.2 cm (previously 11 x 6 x 5.6 cm = volume: 105.2 mL (previously 191.9 ml). Echogenicity is slightly increased with thinning of the cortex. No mass or hydronephrosis visualized. Bladder: Appears normal for degree of bladder distention. Other: None. IMPRESSION: 1. No  hydronephrosis. 2. Echogenicity of the kidneys is slightly increased with thinning of the cortex suggesting medical renal disease. Electronically Signed   By: Marin Roberts M.D.   On: 11/10/2022 10:10   DG Chest Port 1 View  Result Date: 11/07/2022 CLINICAL DATA:  Shortness of breath, question edema. EXAM: PORTABLE CHEST 1 VIEW COMPARISON:  Chest x-ray 09/21/2022.  Chest CT 07/25/2018. FINDINGS: Heart is enlarged, unchanged. Patient is status post cardiac surgery. ICD is again noted. There is a stable calcified granuloma in the left mid lung. There is some minimal strandy and patchy opacities in the lung bases, left greater than right. There is no pneumothorax or pleural effusion. No acute fractures are seen. IMPRESSION: 1. Minimal opacities may represent atelectasis or infection. 2. Stable cardiomegaly. Electronically Signed   By: Ronney Asters M.D.   On: 11/07/2022 23:38    Microbiology: No results found for this or any previous visit (from the past 240 hour(s)).   Labs: Basic Metabolic Panel: Recent Labs  Lab 11/09/22 0155 11/10/22 0330 11/11/22 0126 11/12/22 0618 11/13/22 0155 11/14/22 0205 11/15/22 0221  NA 145 143 139 139 143 139 139  K 2.9* 3.9 3.3* 4.0 3.8 4.0 3.6  CL 108 111 105 110 110 111 107  CO2 '25 26 22 22 24 24 22  '$ GLUCOSE 122* 97 122* 100* 92 104* 84  BUN 24* 28* 37* 40* 39* 35* 33*  CREATININE 1.59* 1.86* 2.08* 2.15* 1.88* 1.76* 1.80*  CALCIUM 8.4* 8.4* 8.9 8.4* 9.3 8.7* 8.9  MG 1.8 2.5*  --   --  2.5*  --   --    Liver Function Tests: No results for input(s): "AST", "ALT", "ALKPHOS", "BILITOT", "PROT", "ALBUMIN" in the last 168 hours. No results for input(s): "LIPASE", "AMYLASE" in the last 168 hours. No results for input(s): "AMMONIA" in the last 168 hours. CBC: Recent Labs  Lab 11/12/22 0618  WBC 5.1  HGB 10.4*  HCT 31.8*  MCV 90.6  PLT 150   Cardiac Enzymes: No results for input(s): "CKTOTAL", "CKMB", "CKMBINDEX", "TROPONINI" in the last 168  hours. BNP: BNP (last 3 results) Recent Labs    11/07/22 2328  BNP 736.8*    ProBNP (last 3 results) No results for input(s): "PROBNP" in the last 8760 hours.  CBG: No results for input(s): "GLUCAP" in the last 168 hours.     Signed:  Domenic Polite MD.  [facility] 11/15/2022, 11:58 AM

## 2022-11-15 NOTE — Progress Notes (Signed)
Physical Therapy Treatment Patient Details Name: Alexander Austin MRN: [id number] DOB: 19-Apr-1946 Today's Date: 11/15/2022   History of Present Illness Pt is a 76 y.o. male admitted 11/07/22 with dyspnea. Workup for acute on chronic CHF. Of note, recent admission 08/2022 with age-indeterminate L frontal infarct. Other PMH includes CHF, CAD s/p CABG, pacemaker, afib, pulmonary HTN, CKD 4, renal cell CA s/p nephrectomy.    PT Comments    The pt was agreeable to session, remains hopeful for d/c home later today. The pt's mobility remains limited by poor activity tolerance and incontinence of bowel and bladder upon standing. The pt was able to complete multiple sit-stand transfers to complete cleaning and changing of clothes, but requires progressive assist to complete transfers with fatigue (from minG to [place]). He was limited to short bout of ambulation in the room due to fatigue, will continue to benefit from mobility and HHPT to maximize functional return and independence with transfers.     Recommendations for follow up therapy are one component of a multi-disciplinary discharge planning process, led by the attending physician.  Recommendations may be updated based on patient status, additional functional criteria and insurance authorization.  Follow Up Recommendations  Home health PT     Assistance Recommended at Discharge PRN  Patient can return home with the following A little help with bathing/dressing/bathroom;Assist for transportation;Assistance with cooking/housework   Equipment Recommendations  None recommended by PT    Recommendations for Other Services       Precautions / Restrictions Precautions Precautions: Fall Precaution Comments: incontinent of loose BM upon standing Restrictions Weight Bearing Restrictions: No     Mobility  Bed Mobility Overal bed mobility: Needs Assistance Bed Mobility: Supine to Sit     Supine to sit: Min assist     General bed mobility  comments: trunk support with HOB elevated and cueing for technique    Transfers Overall transfer level: Needs assistance Equipment used: Rolling walker (2 wheels) Transfers: Sit to/from Stand Sit to Stand: Min guard, Mod assist           General transfer comment: min guard for inital stand from EOB, but as fatigues requires up to mod assist from EOB and recliner    Ambulation/Gait Ambulation/Gait assistance: Min guard Gait Distance (Feet): 15 Feet Assistive device: Rolling walker (2 wheels) Gait Pattern/deviations: Step-through pattern, Decreased stride length, Trunk flexed Gait velocity: decreased Gait velocity interpretation: <1.31 ft/sec, indicative of household ambulator   General Gait Details: slow and limited by incontinence. VSS with mobility. no overt LOB but maintains trunk flexed.      Balance Overall balance assessment: Needs assistance Sitting-balance support: No upper extremity supported Sitting balance-Leahy Scale: Good     Standing balance support: No upper extremity supported, During functional activity, Bilateral upper extremity supported Standing balance-Leahy Scale: Fair Standing balance comment: relies on BUE support dynamically when fatigued                            Cognition Arousal/Alertness: Awake/alert Behavior During Therapy: WFL for tasks assessed/performed Overall Cognitive Status: Impaired/Different from baseline Area of Impairment: Awareness, Problem solving                           Awareness: Emergent Problem Solving: Slow processing, Requires verbal cues General Comments: pt incontinent of bowel upon entry, but able to report needing to get to the bathroom during session.  He demonstrates some  slow problem solving and requires verbal cueing.           General Comments General comments (skin integrity, edema, etc.): SpO2 stable with short bout of gait. Pt remains incontinent of loose stool and uring upon  standing      Pertinent Vitals/Pain Pain Assessment Pain Assessment: No/denies pain     PT Goals (current goals can now be found in the care plan section) Acute Rehab PT Goals Patient Stated Goal: home and stronger PT Goal Formulation: With patient Time For Goal Achievement: 11/23/22 Potential to Achieve Goals: Good Progress towards PT goals: Progressing toward goals    Frequency    Min 3X/week      PT Plan Current plan remains appropriate    Co-evaluation PT/OT/SLP Co-Evaluation/Treatment: Yes Reason for Co-Treatment: Other (comment);To address functional/ADL transfers (to manage pericare and pt mobility) PT goals addressed during session: Mobility/safety with mobility;Balance;Proper use of DME        AM-PAC PT "6 Clicks" Mobility   Outcome Measure  Help needed turning from your back to your side while in a flat bed without using bedrails?: A Little Help needed moving from lying on your back to sitting on the side of a flat bed without using bedrails?: A Little Help needed moving to and from a bed to a chair (including a wheelchair)?: A Lot Help needed standing up from a chair using your arms (e.g., wheelchair or bedside chair)?: A Lot Help needed to walk in hospital room?: A Little Help needed climbing 3-5 steps with a railing? : A Lot 6 Click Score: 15    End of Session Equipment Utilized During Treatment: Gait belt Activity Tolerance: Patient limited by fatigue Patient left: in chair;with call bell/phone within reach;with chair alarm set Nurse Communication: Mobility status PT Visit Diagnosis: Other abnormalities of gait and mobility (R26.89)     Time: 0355-9741 PT Time Calculation (min) (ACUTE ONLY): 31 min  Charges:  $Therapeutic Activity: 8-22 mins                     West Carbo, PT, DPT   Acute Rehabilitation Department   Sandra Cockayne 11/15/2022, 9:08 AM

## 2022-11-15 NOTE — Progress Notes (Signed)
   Heart Failure Stewardship Pharmacist Progress Note   PCP: Windy Fast, MD PCP-Cardiologist: None    HPI:  Alexander Austin is a 76 year old male with PMH of  paroxysmal atrial fibrillation, hypertension, CVA, recent CEA 09/2022, NSTEMI s/p CABG redo in 2016, BiV ICD in 2017, gout, and obesity who presented with a 2 day history of dyspnea, orthopnea, and PND.  Most recent echocardiogram from 09/14/22 showed LVEF of 50-55% with grade II diastolic dysfunction and trivial aortic regurgitation. GDMT limited by AKI.  Discharge HF Medications: Diuretic: furosemide 40 mg BID Beta Blocker: Coreg 12.5 mg BID Other: hydralazine 50 mg TID, amlodipine 10 mg QD  Prior to admission HF Medications: Beta blocker: Coreg 12.5 mg BID Other: hydralazine 50 mg TID, amlodipine 10 mg QD  Pertinent Lab Values: Serum creatinine 1.80 (BL ~1.6), BUN 33, Potassium 3.6, Sodium 139, BNP 736.8, Magnesium 2.5, A1c 4.7   Vital Signs: Weight: 233 lbs (admission weight: 242 lbs stated) Blood pressure: 140/60s  Heart rate: 50-60s AV paced I/O: -1.5L yesterday; net -7.6L this admission  Medication Assistance / Insurance Benefits Check: Does the patient have prescription insurance?  Yes Type of insurance plan: [place]  Outpatient Pharm[facility]  [facility] Is the patient willing to use [facility] at discharge? Yes Is the patient willing to transition their outpatient pharmacy to utilize a [facility] outpatient pharmacy?   No    Assessment: 1. Acute on chronic diastolic CHF (LVEF 10-07%), due to mixed non-ICM and ICM. NYHA class II-III symptoms. - Agree with furosemide 40 mg BID on discharge. Maintain strict I/Os, daily weights, Mg >2, and K >4. - Consider adding spironolactone for HFpEF and help to maintain normokalemia - Patient has limited mobility after CVA and current foley catheter. No SGLT2i at this time. - BB with history of ACS, HR limits titration.   Plan: 1) Medication  changes recommended at this time: - Start spironolactone 25 mg daily at follow up  2) Patient assistance: - none, [facility] Jardiance and Entresto  3)  Education  - Patient has been educated on current HF medications and potential additions to HF medication regimen - Patient verbalizes understanding that over the next few months, these medication doses may change and more medications may be added to optimize HF regimen - Patient has been educated on basic disease state pathophysiology and goals of therapy  Kerby Nora, PharmD, BCPS Heart Failure Cytogeneticist Phone [phone]

## 2022-11-15 NOTE — Progress Notes (Signed)
Occupational Therapy Treatment Patient Details Name: Alexander Austin MRN: [id number] DOB: November 18, 1946 Today's Date: 11/15/2022   History of present illness Pt is a 76 y.o. male admitted 11/07/22 with dyspnea. Workup for acute on chronic CHF. Of note, recent admission 08/2022 with age-indeterminate L frontal infarct. Other PMH includes CHF, CAD s/p CABG, pacemaker, afib, pulmonary HTN, CKD 4, renal cell CA s/p nephrectomy.   OT comments  Pt progressing towards goals, session limited by bowel incontinence. Pt needing min guard-min A for UB/LB dressing and toilet transfer, total A for pericare in standing. Pt min guard - up to mod A for transfers, needing increased assistance when fatigued. Pt presenting with impairments listed below, will follow acutely. Continue to recommend HHOT at d/c.   Recommendations for follow up therapy are one component of a multi-disciplinary discharge planning process, led by the attending physician.  Recommendations may be updated based on patient status, additional functional criteria and insurance authorization.    Follow Up Recommendations  Home health OT     Assistance Recommended at Discharge Intermittent Supervision/Assistance  Patient can return home with the following  A little help with walking and/or transfers;Direct supervision/assist for medications management;Direct supervision/assist for financial management;Help with stairs or ramp for entrance;Assist for transportation;Assistance with cooking/housework;A lot of help with bathing/dressing/bathroom   Equipment Recommendations  None recommended by OT    Recommendations for Other Services PT consult    Precautions / Restrictions Precautions Precautions: Fall Precaution Comments: incontinent of loose BM upon standing Restrictions Weight Bearing Restrictions: No       Mobility Bed Mobility               General bed mobility comments: seated EOB upon arrival    Transfers Overall transfer  level: Needs assistance Equipment used: Rolling walker (2 wheels) Transfers: Sit to/from Stand Sit to Stand: Min guard, Mod assist           General transfer comment: min guard for inital stand from EOB, but as fatigues requires up to mod assist from EOB and recliner     Balance Overall balance assessment: Needs assistance Sitting-balance support: No upper extremity supported Sitting balance-Leahy Scale: Good Sitting balance - Comments: can reach outside BOS without LOB   Standing balance support: No upper extremity supported, During functional activity, Bilateral upper extremity supported Standing balance-Leahy Scale: Fair Standing balance comment: relies on BUE support dynamically when fatigued                           ADL either performed or assessed with clinical judgement   ADL Overall ADL's : Needs assistance/impaired                 Upper Body Dressing : Min guard;Sitting Upper Body Dressing Details (indicate cue type and reason): donning shirt Lower Body Dressing: Minimal assistance Lower Body Dressing Details (indicate cue type and reason): donning pants Toilet Transfer: Min guard;Ambulation;Regular Toilet;Rolling walker (2 wheels) Toilet Transfer Details (indicate cue type and reason): simulated via functional mobility Toileting- Clothing Manipulation and Hygiene: Total assistance Toileting - Clothing Manipulation Details (indicate cue type and reason): pericare in standing     Functional mobility during ADLs: Min guard;Rolling walker (2 wheels)      Extremity/Trunk Assessment Upper Extremity Assessment Upper Extremity Assessment: Generalized weakness   Lower Extremity Assessment Lower Extremity Assessment: Defer to PT evaluation        Vision   Vision Assessment?: No apparent visual deficits  Perception Perception Perception: Not tested   Praxis Praxis Praxis: Not tested    Cognition Arousal/Alertness: Awake/alert Behavior  During Therapy: WFL for tasks assessed/performed Overall Cognitive Status: Impaired/Different from baseline Area of Impairment: Awareness, Problem solving                           Awareness: Emergent Problem Solving: Slow processing, Requires verbal cues          Exercises      Shoulder Instructions       General Comments VSS on RA    Pertinent Vitals/ Pain       Pain Assessment Pain Assessment: No/denies pain  Home Living                                          Prior Functioning/Environment              Frequency  Min 2X/week        Progress Toward Goals  OT Goals(current goals can now be found in the care plan section)  Progress towards OT goals: Progressing toward goals  Acute Rehab OT Goals Patient Stated Goal: none stated OT Goal Formulation: With patient Time For Goal Achievement: 11/24/22 Potential to Achieve Goals: Good ADL Goals Pt Will Perform Upper Body Dressing: with modified independence;sitting Pt Will Perform Lower Body Dressing: with modified independence;sitting/lateral leans;sit to/from stand Pt Will Transfer to Toilet: with modified independence;ambulating;regular height toilet Pt Will Perform Tub/Shower Transfer: Tub transfer;Shower transfer;with supervision;ambulating;shower seat;rolling walker Additional ADL Goal #1: pt will verbalize 3 energy conservation strategies in order to improve activity tolerance for ADLs  Plan Discharge plan remains appropriate;Frequency remains appropriate    Co-evaluation      Reason for Co-Treatment: Other (comment);To address functional/ADL transfers (to manage pericare and pt mobility) PT goals addressed during session: Mobility/safety with mobility;Balance;Proper use of DME        AM-PAC OT "6 Clicks" Daily Activity     Outcome Measure   Help from another person eating meals?: None Help from another person taking care of personal grooming?: A Little Help from  another person toileting, which includes using toliet, bedpan, or urinal?: A Lot Help from another person bathing (including washing, rinsing, drying)?: A Little Help from another person to put on and taking off regular upper body clothing?: A Little Help from another person to put on and taking off regular lower body clothing?: A Little 6 Click Score: 18    End of Session Equipment Utilized During Treatment: Rolling walker (2 wheels)  OT Visit Diagnosis: Unsteadiness on feet (R26.81);Muscle weakness (generalized) (M62.81);Other abnormalities of gait and mobility (R26.89)   Activity Tolerance Patient tolerated treatment well   Patient Left in chair;with call bell/phone within reach;with chair alarm set   Nurse Communication Mobility status        Time: 6720-9470 OT Time Calculation (min): 17 min  Charges: OT General Charges $OT Visit: 1 Visit OT Treatments $Self Care/Home Management : 8-22 mins  Renaye Rakers, OTD, OTR/L SecureChat Preferred Acute Rehab [phone] - [place] 11/15/2022, 9:50 AM

## 2022-11-23 ENCOUNTER — Ambulatory Visit (HOSPITAL_COMMUNITY)
Admit: 2022-11-23 | Discharge: 2022-11-23 | Disposition: A | Discharge status: Home / Self Care | Payer: Medicare Other | Attending: Adult Health | Admitting: Adult Health

## 2022-11-23 VITALS — BP 110/70 | HR 65 | Wt 236.0 lb

## 2022-11-23 DIAGNOSIS — Z7901 Long term (current) use of anticoagulants: Secondary | ICD-10-CM | POA: Insufficient documentation

## 2022-11-23 DIAGNOSIS — F32A Depression, unspecified: Secondary | ICD-10-CM | POA: Diagnosis not present

## 2022-11-23 DIAGNOSIS — Z955 Presence of coronary angioplasty implant and graft: Secondary | ICD-10-CM | POA: Diagnosis not present

## 2022-11-23 DIAGNOSIS — I5043 Acute on chronic combined systolic (congestive) and diastolic (congestive) heart failure: Secondary | ICD-10-CM

## 2022-11-23 DIAGNOSIS — F419 Anxiety disorder, unspecified: Secondary | ICD-10-CM | POA: Insufficient documentation

## 2022-11-23 DIAGNOSIS — I130 Hypertensive heart and chronic kidney disease with heart failure and stage 1 through stage 4 chronic kidney disease, or unspecified chronic kidney disease: Secondary | ICD-10-CM | POA: Diagnosis not present

## 2022-11-23 DIAGNOSIS — I2510 Atherosclerotic heart disease of native coronary artery without angina pectoris: Secondary | ICD-10-CM | POA: Diagnosis not present

## 2022-11-23 DIAGNOSIS — I255 Ischemic cardiomyopathy: Secondary | ICD-10-CM | POA: Diagnosis not present

## 2022-11-23 DIAGNOSIS — Z905 Acquired absence of kidney: Secondary | ICD-10-CM | POA: Insufficient documentation

## 2022-11-23 DIAGNOSIS — I10 Essential (primary) hypertension: Secondary | ICD-10-CM

## 2022-11-23 DIAGNOSIS — Z7902 Long term (current) use of antithrombotics/antiplatelets: Secondary | ICD-10-CM | POA: Diagnosis not present

## 2022-11-23 DIAGNOSIS — I5032 Chronic diastolic (congestive) heart failure: Secondary | ICD-10-CM | POA: Diagnosis not present

## 2022-11-23 DIAGNOSIS — Z79899 Other long term (current) drug therapy: Secondary | ICD-10-CM | POA: Insufficient documentation

## 2022-11-23 DIAGNOSIS — Z8673 Personal history of transient ischemic attack (TIA), and cerebral infarction without residual deficits: Secondary | ICD-10-CM | POA: Diagnosis not present

## 2022-11-23 DIAGNOSIS — I480 Paroxysmal atrial fibrillation: Secondary | ICD-10-CM

## 2022-11-23 DIAGNOSIS — I420 Dilated cardiomyopathy: Secondary | ICD-10-CM | POA: Insufficient documentation

## 2022-11-23 DIAGNOSIS — N1831 Chronic kidney disease, stage 3a: Secondary | ICD-10-CM | POA: Diagnosis not present

## 2022-11-23 DIAGNOSIS — Z951 Presence of aortocoronary bypass graft: Secondary | ICD-10-CM | POA: Insufficient documentation

## 2022-11-23 LAB — BASIC METABOLIC PANEL
Anion gap: 5 (ref 5–15)
BUN: 24 mg/dL — ABNORMAL HIGH (ref 8–23)
CO2: 22 mmol/L (ref 22–32)
Calcium: 8.9 mg/dL (ref 8.9–10.3)
Chloride: 112 mmol/L — ABNORMAL HIGH (ref 98–111)
Creatinine, Ser: 2.09 mg/dL — ABNORMAL HIGH (ref 0.61–1.24)
GFR, Estimated: 32 mL/min — ABNORMAL LOW (ref >60)
Glucose, Bld: 105 mg/dL — ABNORMAL HIGH (ref 70–99)
Potassium: 3.5 mmol/L (ref 3.5–5.1)
Sodium: 139 mmol/L (ref 135–145)

## 2022-11-23 NOTE — Progress Notes (Signed)
HEART & VASCULAR TRANSITION OF CARE CONSULT NOTE     Referring Physician: Dr Alexander Austin  Primary Care: Dr Alexander Austin  Primary Cardiologist: Alexander Austin.   HPI: Referred to clinic by Dr Alexander Austin  for heart failure consultation.   Alexander Austin is a 76 year old with a history CAD, CABG x1 1993 SVG-RCA and multiple stents, CABG redo in 2016) and 4 stents/8 angioplasties, ischemic dilated cardiomyopathy, chronic heart failure, s/p biventricular ICD, paroxysmal atrial fibrillation on Eliquis, CKD IIIa, renal cell carcinoma s/p partial nephrectomy), CVA (2014 &2023), and cervical spinal stenosis.  Admitted 09/2022 with acute CVA. Carotid doppler with Left ICA stenosis. S/P LCEA. Echocardiogram significant for LVEF of 50-55%, severely dilated left atrium and no interatrial shunt. Placed on lipitor, coumadin and plavix . Discharged to SNF, [name].    Admitted 11/07/2022 with A/C HFpEF felt to be in the setting of high sodium diet. Diuresed IV lasix and transitioned lasix 40 mg twice a day. Hospital course complicated by AKI. HHPT/OT with Alexander Austin. Discharge weight 233 pounds.   Today he presents in the wheel chair with his neighbor. Overall feeling fine. Denies chest pain. Denies SOB/PND/Orthopnea. Appetite ok. No fever or chills. Weight at home 228-230  pounds. Lives in the lower level of house with a family. Taking all medications. He has an Aide 7 days a week 4 hours a day.   Cardiac Testing  Echo 08/2022 EF 50-55% RV normal. Grade II DD. Echo 2021 EF 50-55% RV normal  Review of Systems: [y] = yes, '[ ]'$  = no   General: Weight gain '[ ]'$ ; Weight loss '[ ]'$ ; Anorexia '[ ]'$ ; Fatigue [ Y]; Fever '[ ]'$ ; Chills '[ ]'$ ; Weakness [Y ]  Cardiac: Chest pain/pressure '[ ]'$ ; Resting SOB '[ ]'$ ; Exertional SOB '[ ]'$ ; Orthopnea '[ ]'$ ; Pedal Edema '[ ]'$ ; Palpitations '[ ]'$ ; Syncope '[ ]'$ ; Presyncope '[ ]'$ ; Paroxysmal nocturnal dyspnea'[ ]'$   Pulmonary: Cough '[ ]'$ ; Wheezing'[ ]'$ ; Hemoptysis'[ ]'$ ; Sputum '[ ]'$ ; Snoring '[ ]'$   GI: Vomiting'[ ]'$ ; Dysphagia'[ ]'$ ;  Melena'[ ]'$ ; Hematochezia '[ ]'$ ; Heartburn'[ ]'$ ; Abdominal pain '[ ]'$ ; Constipation '[ ]'$ ; Diarrhea '[ ]'$ ; BRBPR '[ ]'$   GU: Hematuria'[ ]'$ ; Dysuria '[ ]'$ ; Nocturia'[ ]'$   Vascular: Pain in legs with walking '[ ]'$ ; Pain in feet with lying flat '[ ]'$ ; Non-healing sores '[ ]'$ ; Stroke [Y ]; TIA '[ ]'$ ; Slurred speech '[ ]'$ ;  Neuro: Headaches'[ ]'$ ; Vertigo'[ ]'$ ; Seizures'[ ]'$ ; Paresthesias'[ ]'$ ;Blurred vision '[ ]'$ ; Diplopia '[ ]'$ ; Vision changes '[ ]'$   Ortho/Skin: Arthritis '[ ]'$ ; Joint pain [Y ]; Muscle pain '[ ]'$ ; Joint swelling '[ ]'$ ; Back Pain [Y ]; Rash '[ ]'$   Psych: Depression[ Y]; Anxiety'[ ]'$   Heme: Bleeding problems '[ ]'$ ; Clotting disorders '[ ]'$ ; Anemia '[ ]'$   Endocrine: Diabetes '[ ]'$ ; Thyroid dysfunction'[ ]'$    Past Medical History:  Diagnosis Date   A-fib [facility])    Anxiety    Benign prostate hyperplasia    Cancer ([place])    Kidney   Chronic kidney disease    Congestive heart failure (CHF) (HCC)    CVA (cerebral infarction)    Depression    Dyspnea    GERD (gastroesophageal reflux disease)    Gout    Hematospermia 01/13/2012   History of myocardial infarction 1993   Hyperlipidemia    Hypertension    Sleep apnea    Stroke [facility])    Vitamin B 12 deficiency    Weakness of left leg 03/28/2020    Current Outpatient Medications  Medication Sig Dispense Refill   albuterol (VENTOLIN HFA) 108 (90 Base) MCG/ACT inhaler Inhale 2 puffs into the lungs 3 (three) times daily as needed for wheezing or shortness of breath.     amLODipine (NORVASC) 10 MG tablet Take 10 mg by mouth daily.     atorvastatin (LIPITOR) 80 MG tablet Take 80 mg by mouth at bedtime.     calcitRIOL (ROCALTROL) 0.25 MCG capsule Take 0.25 mcg by mouth daily.      carvedilol (COREG) 12.5 MG tablet Take 1 tablet (12.5 mg total) by mouth 2 (two) times daily with a meal. 60 tablet 1   clopidogrel (PLAVIX) 75 MG tablet Take 75 mg by mouth daily.     colchicine 0.6 MG tablet Take 0.5 tablets (0.3 mg total) by mouth See admin instructions. Take 0.'3mg'$  on Monday, Wednesday, Friday, and  Sunday.     Docusate Sodium (DSS) 100 MG CAPS Take 200 mg by mouth daily.     edoxaban (SAVAYSA) 60 MG TABS tablet Take 60 mg by mouth daily.     escitalopram (LEXAPRO) 20 MG tablet Take 20 mg by mouth at bedtime.     Febuxostat 80 MG TABS Take 80 mg by mouth daily.     furosemide (LASIX) 40 MG tablet Take 1 tablet (40 mg total) by mouth 2 (two) times daily. 60 tablet 0   hydrALAZINE (APRESOLINE) 50 MG tablet Take 50 mg by mouth 3 (three) times daily.     pantoprazole (PROTONIX) 40 MG tablet Take 40 mg by mouth daily before breakfast.     potassium chloride SA (KLOR-CON M) 20 MEQ tablet Take 1 tablet (20 mEq total) by mouth daily. 30 tablet 0   primidone (MYSOLINE) 50 MG tablet Take 100 mg by mouth in the morning and at bedtime.      traZODone (DESYREL) 50 MG tablet Take 25 mg by mouth at bedtime as needed for sleep.     Vitamin D, Ergocalciferol, (DRISDOL) 1.25 MG (50000 UNIT) CAPS capsule Take 50,000 Units by mouth every 7 (seven) days. Sunday     No current facility-administered medications for this encounter.    Allergies  Allergen Reactions   Isosorbide Nitrate Anaphylaxis    Other reaction(s): Cardiovascular Arrest (ALLERGY/intolerance) Can take Sublingual Nitro.    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      Social History   Socioeconomic History   Marital status: Divorced    Spouse name: Not on file   Number of children: Not on file   Years of education: Not on file   Highest education level: Not on file  Occupational History   Occupation: retired  Tobacco Use   Smoking status: Former    Types: Cigarettes    Quit date: 07/24/1982    Years since quitting: 40.3   Smokeless tobacco: Never  Vaping Use   Vaping Use: Never used  Substance and Sexual Activity   Alcohol use: No    Alcohol/week: 0.0 standard drinks of alcohol   Drug use: No   Sexual activity: Not Currently  Other Topics Concern   Not on file   Social History Narrative   Not on file   Social Determinants of Health   Financial Resource Strain: [place]  (11/09/2022)   Overall Financial Resource Strain (CARDIA)    Difficulty of Paying Living Expenses: Not hard at all  Food Insecurity: No Food Insecurity (11/14/2022)   Hunger Vital Sign    Worried About  Running Out of Food in the Last Year: Never true    [place] in the Last Year: Never true  Transportation Needs: No Transportation Needs (11/09/2022)   PRAPARE - Hydrologist (Medical): No    Lack of Transportation (Non-Medical): No  Physical Activity: Not on file  Stress: Not on file  Social Connections: Not on file  Intimate Partner Violence: Not At Risk (11/14/2022)   Humiliation, Afraid, Rape, and Kick questionnaire    Fear of Current or Ex-Partner: No    Emotionally Abused: No    Physically Abused: No    Sexually Abused: No      Family History  Problem Relation Age of Onset   Hypertension Mother    Heart disease Mother    Stroke Mother    Hypertension Father     Vitals:   11/23/22 1416  BP: 110/70  Pulse: 65  SpO2: 97%  Weight: 107 kg (236 lb)    PHYSICAL EXAM: General: Arrived in a wheel chair. No respiratory difficulty HEENT: normal Neck: supple. no JVD. Carotids 2+ bilat; no bruits. No lymphadenopathy or thryomegaly appreciated. Cor: PMI nondisplaced. Regular rate & rhythm. No rubs, gallops or murmurs. Lungs: clear Abdomen: soft, nontender, nondistended. No hepatosplenomegaly. No bruits or masses. Good bowel sounds. Extremities: no cyanosis, clubbing, rash, edema Neuro: alert & oriented x 3, cranial nerves grossly intact. moves all 4 extremities w/o difficulty. Affect pleasant.  ASSESSMENT & PLAN: 1. HFpEF  Echo 10/2022 EF 50-55% Grade II DD.  NYHA II GDMT  Diuretic-Volume status stable.  BB- Continue carvedilol 12.5 mg twice a day  Ace/ARB/ARNI- No recent AKI  MRA- No recent AKI SGLT2i- No concern for  hygiene Check BMET.  Discussed low salt food choices.   2. HTN  Stable. Continue current regimen.   3. PAF Regular on exam.  On bb and edoxaban.   Referred to HFSW (PCP, Medications, Transportation, ETOH Abuse, Drug Abuse, Insurance, Financial ): No Refer to Pharmacy: No Refer to Home Health: Followed by Alexander Austin Refer to Advanced Heart Failure Clinic: No  Refer to General Cardiology: Followed at [facility] in [place]. He does not want long term follow up in the HF clinic.   Follow up as needed.  Fenix Ruppe NP-C  4:39 PM

## 2022-11-23 NOTE — Patient Instructions (Signed)
No change in medications. Lab drawn today - will call you if abnormal. Call [facility] and make appointment if needed at [phone].

## 2023-01-13 ENCOUNTER — Emergency Department (HOSPITAL_COMMUNITY)
Admission: EM | Admit: 2023-01-13 | Discharge: 2023-01-14 | Disposition: A | Discharge status: Home / Self Care | Payer: No Typology Code available for payment source | Attending: Emergency Medicine | Admitting: Emergency Medicine

## 2023-01-13 ENCOUNTER — Other Ambulatory Visit: Payer: Self-pay

## 2023-01-13 ENCOUNTER — Encounter (HOSPITAL_COMMUNITY): Payer: Self-pay

## 2023-01-13 ENCOUNTER — Emergency Department (HOSPITAL_COMMUNITY): Payer: No Typology Code available for payment source

## 2023-01-13 ENCOUNTER — Other Ambulatory Visit (HOSPITAL_COMMUNITY): Payer: No Typology Code available for payment source

## 2023-01-13 DIAGNOSIS — E876 Hypokalemia: Secondary | ICD-10-CM | POA: Insufficient documentation

## 2023-01-13 DIAGNOSIS — Y92002 Bathroom of unspecified non-institutional (private) residence single-family (private) house as the place of occurrence of the external cause: Secondary | ICD-10-CM | POA: Diagnosis not present

## 2023-01-13 DIAGNOSIS — S30810A Abrasion of lower back and pelvis, initial encounter: Secondary | ICD-10-CM | POA: Diagnosis not present

## 2023-01-13 DIAGNOSIS — Z950 Presence of cardiac pacemaker: Secondary | ICD-10-CM | POA: Insufficient documentation

## 2023-01-13 DIAGNOSIS — R55 Syncope and collapse: Secondary | ICD-10-CM | POA: Diagnosis not present

## 2023-01-13 DIAGNOSIS — S3992XA Unspecified injury of lower back, initial encounter: Secondary | ICD-10-CM | POA: Diagnosis present

## 2023-01-13 DIAGNOSIS — Z7902 Long term (current) use of antithrombotics/antiplatelets: Secondary | ICD-10-CM | POA: Insufficient documentation

## 2023-01-13 DIAGNOSIS — W19XXXA Unspecified fall, initial encounter: Secondary | ICD-10-CM

## 2023-01-13 DIAGNOSIS — W182XXA Fall in (into) shower or empty bathtub, initial encounter: Secondary | ICD-10-CM | POA: Insufficient documentation

## 2023-01-13 LAB — CBC
HCT: 30 % — ABNORMAL LOW (ref 39.0–52.0)
Hemoglobin: 9.9 g/dL — ABNORMAL LOW (ref 13.0–17.0)
MCH: 28.7 pg (ref 26.0–34.0)
MCHC: 33 g/dL (ref 30.0–36.0)
MCV: 87 fL (ref 80.0–100.0)
Platelets: 155 K/uL (ref 150–400)
RBC: 3.45 MIL/uL — ABNORMAL LOW (ref 4.22–5.81)
RDW: 14.6 % (ref 11.5–15.5)
WBC: 4.7 K/uL (ref 4.0–10.5)
nRBC: 0 % (ref 0.0–0.2)

## 2023-01-13 LAB — BASIC METABOLIC PANEL WITH GFR
Anion gap: 9 (ref 5–15)
BUN: 30 mg/dL — ABNORMAL HIGH (ref 8–23)
CO2: 22 mmol/L (ref 22–32)
Calcium: 9 mg/dL (ref 8.9–10.3)
Chloride: 109 mmol/L (ref 98–111)
Creatinine, Ser: 1.82 mg/dL — ABNORMAL HIGH (ref 0.61–1.24)
GFR, Estimated: 38 mL/min — ABNORMAL LOW
Glucose, Bld: 110 mg/dL — ABNORMAL HIGH (ref 70–99)
Potassium: 3.1 mmol/L — ABNORMAL LOW (ref 3.5–5.1)
Sodium: 140 mmol/L (ref 135–145)

## 2023-01-13 LAB — TROPONIN I (HIGH SENSITIVITY)
Troponin I (High Sensitivity): 21 ng/L — ABNORMAL HIGH
Troponin I (High Sensitivity): 19 ng/L — ABNORMAL HIGH

## 2023-01-13 LAB — CBG MONITORING, ED: Glucose-Capillary: 111 mg/dL — ABNORMAL HIGH (ref 70–99)

## 2023-01-13 MED ORDER — POTASSIUM CHLORIDE CRYS ER 20 MEQ PO TBCR
40.0000 meq | EXTENDED_RELEASE_TABLET | Freq: Once | ORAL | Status: AC
  Administered 2023-01-13: 40 meq via ORAL
  Filled 2023-01-13: qty 2

## 2023-01-13 NOTE — ED Provider Triage Note (Signed)
Emergency Medicine Provider Triage Evaluation Note  Alexander Austin , a 77 y.o. male  was evaluated in triage.  Pt is here after a fall.  Patient states he had a fall at home however he did not recall the event.  He states the last thing he knows is that he was on the floor of the bathroom.  He does not remember hitting his head or LOC. He complains of neck pain and low back pain.  Review of Systems  Positive: As above Negative: As above.  Physical Exam  BP (!) 148/66 (BP Location: Left Arm)   Pulse 66   Temp 98.5 F (36.9 C) (Oral)   Resp 18   Ht '6\' 1"'$  (1.854 m)   Wt 107 kg   SpO2 95%   BMI 31.12 kg/m  Gen:   Awake, no distress   Resp:  Normal effort  MSK:   Moves extremities without difficulty  Other:  Skin abrasion on R lower back.  Medical Decision Making  Medically screening exam initiated at 5:11 PM.  Appropriate orders placed.  ZYERE JIMINEZ was informed that the remainder of the evaluation will be completed by another provider, this initial triage assessment does not replace that evaluation, and the importance of remaining in the ED until their evaluation is complete.    Rex Kras, [place] 01/13/23 [phone]

## 2023-01-13 NOTE — ED Provider Notes (Signed)
[facility] Provider Note   CSN: [id number] Arrival date & time: 01/13/23  1638     History  Chief Complaint  Patient presents with   Loss of Consciousness    Alexander Austin is a 77 y.o. male.  77 year old male presents after having syncopal event.  Patient states that he used the toilet states when he went to pull up his pants he passed out.  No prodromal symptoms.  No seizure activity.  Denies any headache or chest pain prior to the event.  Patient did suffer injury to his lower back and did scrape the skin.  He is on Plavix.  Denies any nausea or vomiting.  Feels back to his baseline.  No prior history of syncope.  No recent changes to his medications.  Feels back to his baseline at this time.       Home Medications Prior to Admission medications   Medication Sig Start Date End Date Taking? Authorizing Provider  albuterol (VENTOLIN HFA) 108 (90 Base) MCG/ACT inhaler Inhale 2 puffs into the lungs 3 (three) times daily as needed for wheezing or shortness of breath. 07/26/22   [provider]  amLODipine (NORVASC) 10 MG tablet Take 10 mg by mouth daily.    [provider]  atorvastatin (LIPITOR) 80 MG tablet Take 80 mg by mouth at bedtime.    [provider]  calcitRIOL (ROCALTROL) 0.25 MCG capsule Take 0.25 mcg by mouth daily.     [provider]  carvedilol (COREG) 12.5 MG tablet Take 1 tablet (12.5 mg total) by mouth 2 (two) times daily with a meal. 09/23/22   Dessa Phi, DO  clopidogrel (PLAVIX) 75 MG tablet Take 75 mg by mouth daily. 09/30/20   [provider]  colchicine 0.6 MG tablet Take 0.5 tablets (0.3 mg total) by mouth See admin instructions. Take 0.'3mg'$  on Monday, Wednesday, Friday, and Sunday. 11/15/22   Domenic Polite, MD  Docusate Sodium (DSS) 100 MG CAPS Take 200 mg by mouth daily. 02/02/22   [provider]  edoxaban (SAVAYSA) 60 MG TABS tablet Take 60 mg by mouth  daily. 10/28/22   [provider]  escitalopram (LEXAPRO) 20 MG tablet Take 20 mg by mouth at bedtime. 02/25/21   [provider]  Febuxostat 80 MG TABS Take 80 mg by mouth daily. 10/21/20   [provider]  furosemide (LASIX) 40 MG tablet Take 1 tablet (40 mg total) by mouth 2 (two) times daily. 11/15/22   Domenic Polite, MD  hydrALAZINE (APRESOLINE) 50 MG tablet Take 50 mg by mouth 3 (three) times daily.    [provider]  pantoprazole (PROTONIX) 40 MG tablet Take 40 mg by mouth daily before breakfast.    [provider]  potassium chloride SA (KLOR-CON M) 20 MEQ tablet Take 1 tablet (20 mEq total) by mouth daily. 11/15/22   Domenic Polite, MD  primidone (MYSOLINE) 50 MG tablet Take 100 mg by mouth in the morning and at bedtime.     [provider]  traZODone (DESYREL) 50 MG tablet Take 25 mg by mouth at bedtime as needed for sleep. 07/02/22   [provider]  Vitamin D, Ergocalciferol, (DRISDOL) 1.25 MG (50000 UNIT) CAPS capsule Take 50,000 Units by mouth every 7 (seven) days. Sunday    [provider]  potassium chloride (KLOR-CON) 10 MEQ tablet Take 10 mEq by mouth daily. 09/29/22 11/15/22  [provider]      Allergies  Isosorbide nitrate and Shellfish-derived products    Review of Systems   Review of Systems  All other systems reviewed and are negative.   Physical Exam Updated Vital Signs BP (!) 163/69 (BP Location: Left Arm)   Pulse 71   Temp 97.7 F (36.5 C) (Oral)   Resp 18   Ht 1.854 m ('6\' 1"'$ )   Wt 107 kg   SpO2 97%   BMI 31.12 kg/m  Physical Exam Vitals and nursing note reviewed.  Constitutional:      General: He is not in acute distress.    Appearance: Normal appearance. He is well-developed. He is not toxic-appearing.  HENT:     Head: Normocephalic and atraumatic.  Eyes:     General: Lids are normal.     Conjunctiva/sclera: Conjunctivae normal.     Pupils: Pupils are equal,  round, and reactive to light.  Neck:     Thyroid: No thyroid mass.     Trachea: No tracheal deviation.  Cardiovascular:     Rate and Rhythm: Normal rate and regular rhythm.     Heart sounds: Normal heart sounds. No murmur heard.    No gallop.  Pulmonary:     Effort: Pulmonary effort is normal. No respiratory distress.     Breath sounds: Normal breath sounds. No stridor. No decreased breath sounds, wheezing, rhonchi or rales.  Abdominal:     General: There is no distension.     Palpations: Abdomen is soft.     Tenderness: There is no abdominal tenderness. There is no rebound.  Musculoskeletal:        General: No tenderness. Normal range of motion.     Cervical back: Normal range of motion and neck supple.       Back:  Skin:    General: Skin is warm and dry.     Findings: No abrasion or rash.  Neurological:     General: No focal deficit present.     Mental Status: He is alert and oriented to person, place, and time. Mental status is at baseline.     GCS: GCS eye subscore is 4. GCS verbal subscore is 5. GCS motor subscore is 6.     Cranial Nerves: No cranial nerve deficit.     Sensory: No sensory deficit.     Motor: Motor function is intact.     Comments: Strength is 5 out of 5 in upper as well as lower extremities.  Psychiatric:        Attention and Perception: Attention normal.        Speech: Speech normal.        Behavior: Behavior normal.     ED Results / Procedures / Treatments   Labs (all labs ordered are listed, but only abnormal results are displayed) Labs Reviewed  BASIC METABOLIC PANEL - Abnormal; Notable for the following components:      Result Value   Potassium 3.1 (*)    Glucose, Bld 110 (*)    BUN 30 (*)    Creatinine, Ser 1.82 (*)    GFR, Estimated 38 (*)    All other components within normal limits  CBC - Abnormal; Notable for the following components:   RBC 3.45 (*)    Hemoglobin 9.9 (*)    HCT 30.0 (*)    All other components within normal limits   CBG MONITORING, ED - Abnormal; Notable for the following components:   Glucose-Capillary 111 (*)    All other components within normal limits  TROPONIN I (HIGH SENSITIVITY) - Abnormal; Notable for the following components:   Troponin I (High Sensitivity) 21 (*)    All other components within normal limits  TROPONIN I (HIGH SENSITIVITY)    EKG EKG Interpretation  Date/Time:  Thursday January 13 2023 16:29:18 EST Ventricular Rate:  66 PR Interval:  168 QRS Duration: 152 QT Interval:  464 QTC Calculation: 486 R Axis:   -2 Text Interpretation: AV dual-paced rhythm Abnormal ECG When compared with ECG of 23-Nov-2022 14:24, PREVIOUS ECG IS PRESENT No significant change since last tracing Confirmed by Lacretia Leigh ([id number]) on 01/13/2023 8:12:22 PM  Radiology CT Cervical Spine Wo Contrast  Result Date: 01/13/2023 CLINICAL DATA:  Fall with neck pain EXAM: CT CERVICAL SPINE WITHOUT CONTRAST TECHNIQUE: Multidetector CT imaging of the cervical spine was performed without intravenous contrast. Multiplanar CT image reconstructions were also generated. RADIATION DOSE REDUCTION: This exam was performed according to the departmental dose-optimization program which includes automated exposure control, adjustment of the mA and/or kV according to patient size and/or use of iterative reconstruction technique. COMPARISON:  CT 09/07/2014 FINDINGS: Alignment: No subluxation.  Facet alignment within normal limits. Skull base and vertebrae: Vertebral body heights are maintained. There is no fracture identified Soft tissues and spinal canal: No prevertebral fluid or swelling. No visible canal hematoma. Disc levels: Bridging anterior osteophytes. Partial ankylosis throughout the cervical spine. Multilevel mild disc space narrowing. 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 Facet degenerative change at multiple  levels with multiple level partial ankylosis. Upper chest: Negative. Other: None IMPRESSION: 1. No CT evidence for acute osseous abnormality. 2. 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 3. 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 Electronically Signed   By: Donavan Foil M.D.   On: 01/13/2023 18:52   DG Lumbar Spine 2-3 Views  Result Date: 01/13/2023 CLINICAL DATA:  Lower back EXAM: LUMBAR SPINE - 2-3 VIEW COMPARISON:  None Available. FINDINGS: There is no evidence of lumbar spine fracture. Alignment is normal. There is moderate disc space narrowing at L4-L5 and L5-S1. Otherwise there is mild diffuse disc space narrowing throughout the lower thoracic and lumbar spine. Anterior osteophytes are seen throughout the lumbar spine. There are atherosclerotic calcifications of the aorta. There surgical clips in the right upper quadrant. IMPRESSION: Multilevel degenerative changes throughout the lower thoracic and lumbar spine. Electronically Signed   By: Ronney Asters M.D.   On: 01/13/2023 18:22   DG Chest 2 View  Result Date: 01/13/2023 CLINICAL DATA:  Syncope EXAM: CHEST - 2 VIEW COMPARISON:  One-view x-ray 11/07/2022 FINDINGS: Enlarged cardiopericardial silhouette. Left chest defibrillator. Sternal wires. No edema. No consolidation, pneumothorax or effusion. Degenerative changes on the lateral view. Films are under penetrated. Stable mid left lung nodule. IMPRESSION: Postop chest with defibrillator. Enlarged heart. No acute cardiopulmonary disease Electronically Signed   By: Jill Side M.D.   On: 01/13/2023 18:15    Procedures Procedures    Medications Ordered in ED Medications  potassium chloride SA (KLOR-CON M) CR tablet 40 mEq (has no administration in time range)    ED Course/ Medical Decision Making/ A&P                              Medical Decision  Making Amount and/or Complexity of Data Reviewed Labs: ordered. Radiology: ordered.  Risk Prescription drug management.  Chest x-ray per interpretation showed no acute findings. Patient is EKG per my interpretation showed no acute changes.  Patient is LS-spine series per my interpretation showed no acute findings.  11:32 PM Patient with flat troponins here.  Mild hypokalemia noted and treated with oral potassium.  Patient's pacemaker interrogated and unsure of how to interpret this.  Consult to cardiology and they will come see the patient.  Care signed out to next divider       Final Clinical Impression(s) / ED Diagnoses Final diagnoses:  None    Rx / DC Orders ED Discharge Orders     None         Lacretia Leigh, MD 01/13/23 2332

## 2023-01-13 NOTE — ED Triage Notes (Signed)
Pt states he was in the bathroom and next thing he knows, he woke up on the floor this morning. Pt is on coumadin. Pt c/o lower right back pain where the skin tear is located from the fall. Pt denies any other pain besides the HA he has had for a week now.

## 2023-01-13 NOTE — ED Notes (Signed)
Pt [employer] pacemaker interrogated at this time

## 2023-01-14 NOTE — ED Notes (Signed)
Pt was ambulated with assistance. Pt was able to bear weight and ambulate down hallway.

## 2023-01-14 NOTE — ED Provider Notes (Signed)
I assumed care of this patient.  Please see previous provider note for further details of Hx, PE.  Briefly patient is a 77 y.o. male who presented for fall vs syncope. Initial work up reassuring Pending Cardiology assessment of pacer interrogation and delta trop.  Trop flat Cardiology cleared patient. He was able to ambulate with minimal assistance. Uses walker at home. After speaking with patient, it appears he likely lost his balance and fell.  The patient appears reasonably screened and/or stabilized for discharge and I doubt any other medical condition or other [facility] requiring further screening, evaluation, or treatment in the ED at this time. I have discussed the findings, Dx and Tx plan with the patient/family who expressed understanding and agree(s) with the plan. Discharge instructions discussed at length. The patient/family was given strict return precautions who verbalized understanding of the instructions. No further questions at time of discharge.  Disposition: Discharge  Condition: Good  ED Discharge Orders     None        Follow Up: Windy Fast, [place] [address] [phone]  Call  to schedule an appointment for close follow up          Demari Kropp, Grayce Sessions, MD 01/14/23 [phone]

## 2023-03-14 ENCOUNTER — Encounter: Payer: Self-pay | Admitting: Diagnostic Neuroimaging

## 2023-03-14 ENCOUNTER — Ambulatory Visit (INDEPENDENT_AMBULATORY_CARE_PROVIDER_SITE_OTHER): Payer: No Typology Code available for payment source | Admitting: Diagnostic Neuroimaging

## 2023-03-14 VITALS — BP 153/75 | HR 61 | Ht 74.0 in | Wt 239.0 lb

## 2023-03-14 DIAGNOSIS — M79604 Pain in right leg: Secondary | ICD-10-CM

## 2023-03-14 DIAGNOSIS — M79605 Pain in left leg: Secondary | ICD-10-CM | POA: Diagnosis not present

## 2023-03-14 MED ORDER — GABAPENTIN 300 MG PO CAPS: 300.0000 mg | ORAL_CAPSULE | Freq: Every day | ORAL | 6 refills | Status: DC

## 2023-03-14 NOTE — Progress Notes (Signed)
[facility]  PATIENT: Alexander Austin DOB: 21-Sep-1946  REFERRING CLINICIAN: Dwyane Dee,* HISTORY FROM: patient  REASON FOR VISIT: new consult   HISTORICAL  CHIEF COMPLAINT:  Chief Complaint  Patient presents with   Numbness    Rm 6 alone  Pt is well, reports he has been having pain, tingling, burning and cramps in LE for about 2-3 years. [facility] the [facility] of [facility]     HISTORY OF PRESENT ILLNESS:   77 year old male here for evaluation of lower extremity pain.  Symptoms present for 3 to 4 years.  He describes joint pain in his knees, ankles and feet.  Also has some numbness tingling and burning in the toes and feet.  Has had low back surgery x 3.  Continues to have chronic low back pain issues as well.  Went to podiatrist who referred patient here for evaluation of neuropathy.   REVIEW OF SYSTEMS: Full 14 system review of systems performed and negative with exception of: as per HPI.  ALLERGIES: Allergies  Allergen Reactions   Isosorbide Nitrate Anaphylaxis    Other reaction(s): Cardiovascular Arrest (ALLERGY/intolerance) Can take Sublingual Nitro.    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    HOME MEDICATIONS: Outpatient Medications Prior to Visit  Medication Sig Dispense Refill   albuterol (VENTOLIN HFA) 108 (90 Base) MCG/ACT inhaler Inhale 2 puffs into the lungs 3 (three) times daily as needed for wheezing or shortness of breath.     allopurinol (ZYLOPRIM) 100 MG tablet Take 100 mg by mouth daily.     amLODipine (NORVASC) 10 MG tablet Take 10 mg by mouth daily.     apixaban (ELIQUIS) 5 MG TABS tablet Take 5 mg by mouth 2 (two) times daily.     atorvastatin (LIPITOR) 80 MG tablet Take 80 mg by mouth at bedtime.     calcitRIOL (ROCALTROL) 0.25 MCG capsule Take 0.25 mcg by mouth daily.      carvedilol (COREG) 12.5 MG tablet Take 1 tablet (12.5 mg total) by mouth  2 (two) times daily with a meal. 60 tablet 1   clopidogrel (PLAVIX) 75 MG tablet Take 75 mg by mouth daily.     colchicine 0.6 MG tablet Take 0.5 tablets (0.3 mg total) by mouth See admin instructions. Take 0.3mg  on Monday, Wednesday, Friday, and Sunday.     Docusate Sodium (DSS) 100 MG CAPS Take 200 mg by mouth daily.     edoxaban (SAVAYSA) 60 MG TABS tablet Take 60 mg by mouth daily.     escitalopram (LEXAPRO) 20 MG tablet Take 20 mg by mouth at bedtime.     Febuxostat 80 MG TABS Take 80 mg by mouth daily.     furosemide (LASIX) 40 MG tablet Take 1 tablet (40 mg total) by mouth 2 (two) times daily. 60 tablet 0   hydrALAZINE (APRESOLINE) 50 MG tablet Take 50 mg by mouth 3 (three) times daily.     pantoprazole (PROTONIX) 40 MG tablet Take 40 mg by mouth daily before breakfast.     potassium chloride SA (KLOR-CON M) 20 MEQ tablet Take 1 tablet (20 mEq total) by mouth daily. 30 tablet 0   primidone (MYSOLINE) 50 MG tablet Take 100 mg by mouth in the morning and at bedtime.      traZODone (DESYREL) 50 MG tablet Take 25 mg by mouth at bedtime as needed for sleep.     Vitamin  D, Ergocalciferol, (DRISDOL) 1.25 MG (50000 UNIT) CAPS capsule Take 50,000 Units by mouth every 7 (seven) days. Sunday     No facility-administered medications prior to visit.    PAST MEDICAL HISTORY: Past Medical History:  Diagnosis Date   A-fib [facility])    Anxiety    Benign prostate hyperplasia    Cancer ([place])    Kidney   Chronic kidney disease    Congestive heart failure (CHF) (HCC)    CVA (cerebral infarction)    Depression    Dyspnea    GERD (gastroesophageal reflux disease)    Gout    Hematospermia 01/13/2012   History of myocardial infarction 1993   Hyperlipidemia    Hypertension    Sleep apnea    Stroke ([place])    Vitamin B 12 deficiency    Weakness of left leg 03/28/2020    PAST SURGICAL HISTORY: Past Surgical History:  Procedure Laterality Date   ANGIOPLASTY     X 8    BACK SURGERY      CHOLECYSTECTOMY     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   ENDARTERECTOMY Left 09/20/2022   Procedure: ENDARTERECTOMY CAROTID WITH 1 CM X 6CM BIOLOGIC PATCH.;  Surgeon: Angelia Mould, MD;  Location: [facility] OR;  Service: Vascular;  Laterality: Left;   SAPHENOUS VEIN GRAFT RESECTION  02/05/15    FAMILY HISTORY: Family History  Problem Relation Age of Onset   Hypertension Mother    Heart disease Mother    Stroke Mother    Hypertension Father     SOCIAL HISTORY: Social History   Socioeconomic History   Marital status: Divorced    Spouse name: Not on file   Number of children: Not on file   Years of education: Not on file   Highest education level: Not on file  Occupational History   Occupation: retired  Tobacco Use   Smoking status: Former    Types: Cigarettes    Quit date: 07/24/1982    Years since quitting: 40.6   Smokeless tobacco: Never  Vaping Use   Vaping Use: Never used  Substance and Sexual Activity   Alcohol use: No    Alcohol/week: 0.0 standard drinks of alcohol   Drug use: No   Sexual activity: Not Currently  Other Topics Concern   Not on file  Social History Narrative   Not on file   Social Determinants of Health   Financial Resource Strain: Low Risk  (11/09/2022)   Overall Financial Resource Strain (CARDIA)    Difficulty of Paying Living Expenses: Not hard at all  Food Insecurity: No Food Insecurity (11/14/2022)   Hunger Vital Sign    Worried About Running Out of Food in the Last Year: Never true    [place] in the Last Year: Never true  Transportation Needs: No Transportation Needs (11/09/2022)   PRAPARE - Hydrologist (Medical): No    Lack of Transportation (Non-Medical): No  Physical Activity: Not on file  Stress: Not on file  Social Connections: Not on file  Intimate Partner Violence: Not At Risk (11/14/2022)   Humiliation, Afraid, Rape, and Kick questionnaire    Fear of Current or Ex-Partner: No     Emotionally Abused: No    Physically Abused: No    Sexually Abused: No     PHYSICAL EXAM  GENERAL EXAM/CONSTITUTIONAL: Vitals:  Vitals:   03/14/23 1112 03/14/23 1117  BP: (!) 164/70 (!) 153/75  Pulse: (!) 59 61  Weight: 239 lb (108.4 kg)   Height: 6\' 2"  (1.88 m)    Body mass index is 30.69 kg/m. Wt Readings from Last 3 Encounters:  03/14/23 239 lb (108.4 kg)  01/13/23 235 lb 14.3 oz (107 kg)  11/23/22 236 lb (107 kg)   Patient is in no distress; well developed, nourished and groomed; neck is supple  CARDIOVASCULAR: Examination of carotid arteries is normal; no carotid bruits Regular rate and rhythm, no murmurs Examination of peripheral vascular system by observation and palpation is normal  EYES: Ophthalmoscopic exam of optic discs and posterior segments is normal; no papilledema or hemorrhages No results found.  MUSCULOSKELETAL: Gait, strength, tone, movements noted in Neurologic exam below  NEUROLOGIC: MENTAL STATUS:      No data to display         awake, alert, oriented to person, place and time recent and remote memory intact normal attention and concentration language fluent, comprehension intact, naming intact fund of knowledge appropriate  CRANIAL NERVE:  2nd - no papilledema on fundoscopic exam 2nd, 3rd, 4th, 6th - pupils equal and reactive to light, visual fields full to confrontation, extraocular muscles intact, no nystagmus 5th - facial sensation symmetric 7th - facial strength symmetric 8th - hearing intact 9th - palate elevates symmetrically, uvula midline 11th - shoulder shrug symmetric 12th - tongue protrusion midline  MOTOR:  normal bulk and tone, full strength in the BUE, BLE  SENSORY:  normal and symmetric to light touch; DECR IN LEGS; ABSENT IN FEET  COORDINATION:  finger-nose-finger, fine finger movements normal  REFLEXES:  deep tendon reflexes TRACE and symmetric  GAIT/STATION:  narrow based gait; USING  WALKER     DIAGNOSTIC DATA (LABS, IMAGING, TESTING) - I reviewed patient records, labs, notes, testing and imaging myself where available.  Lab Results  Component Value Date   WBC 4.7 01/13/2023   HGB 9.9 (L) 01/13/2023   HCT 30.0 (L) 01/13/2023   MCV 87.0 01/13/2023   PLT 155 01/13/2023      Component Value Date/Time   NA 140 01/13/2023 1701   NA 140 02/12/2015 0000   K 3.1 (L) 01/13/2023 1701   CL 109 01/13/2023 1701   CO2 22 01/13/2023 1701   GLUCOSE 110 (H) 01/13/2023 1701   BUN 30 (H) 01/13/2023 1701   BUN 40 (A) 02/12/2015 0000   CREATININE 1.82 (H) 01/13/2023 1701   CALCIUM 9.0 01/13/2023 1701   PROT 5.7 (L) 11/07/2022 2328   ALBUMIN 3.2 (L) 11/07/2022 2328   AST 17 11/07/2022 2328   ALT 16 11/07/2022 2328   ALKPHOS 89 11/07/2022 2328   BILITOT 0.9 11/07/2022 2328   GFRNONAA 38 (L) 01/13/2023 1701   GFRAA 48 (L) 07/06/2020 0105   Lab Results  Component Value Date   CHOL 95 09/14/2022   HDL 31 (L) 09/14/2022   LDLCALC 49 09/14/2022   TRIG 75 09/14/2022   CHOLHDL 3.1 09/14/2022   Lab Results  Component Value Date   HGBA1C 4.7 (L) 09/14/2022   No results found for: "VITAMINB12" Lab Results  Component Value Date   TSH 1.345 04/04/2020      ASSESSMENT AND PLAN  77 y.o. year old male here with:   Dx:  1. Pain in both lower extremities     PLAN:  LOWER EXTREMITY PAIN (since ~2020; mainly joint pain, arthritis; some burning and tingling in toes; chronic low back pain) - check neuropathy labs; trial of gabapentin 300mg  at bedtime - s/p lumbar surgery x 3; not  interested in additional surgery (therefore will defer MRI lumbar spine) - consider ABI testing to rule out PAD ([facility]) - recommend to establish with pain mgmt clinic (defer to [place] to setup referral)  Orders Placed This Encounter  Procedures   Vitamin B12   Hemoglobin A1c   TSH   Multiple Myeloma Panel (SPEP&IFE w/QIG)   ANA,IFA RA Diag Pnl w/rflx Tit/Patn    Uric Acid   Meds ordered this encounter  Medications   gabapentin (NEURONTIN) 300 MG capsule    Sig: Take 1 capsule (300 mg total) by mouth at bedtime.    Dispense:  30 capsule    Refill:  6   Return for return to referring provider, return to PCP.    Penni Bombard, MD [id number], 0000000 PM Certified in Neurology, Neurophysiology and Neuroimaging  [facility] [address] [place], [place] [address] [phone]

## 2023-03-14 NOTE — Patient Instructions (Signed)
LOWER EXTREMITY PAIN (since ~2020; mainly joint pain, arthritis; some burning and tingling in toes; chronic low back pain) - check neuropathy labs; trial of gabapentin 300mg  at bedtime - s/p lumbar surgery x 3; not interested in additional surgery (therefore will defer MRI lumbar spine) - consider ABI testing to rule out PAD ([facility]) - recommend to establish with pain mgmt clinic (defer to [place] to setup referral)

## 2023-03-19 LAB — VITAMIN B12: Vitamin B-12: 400 pg/mL (ref 232–1245)

## 2023-03-19 LAB — MULTIPLE MYELOMA PANEL, SERUM
Albumin SerPl Elph-Mcnc: 3.6 g/dL (ref 2.9–4.4)
Albumin/Glob SerPl: 1.4 (ref 0.7–1.7)
Alpha 1: 0.3 g/dL (ref 0.0–0.4)
Alpha2 Glob SerPl Elph-Mcnc: 0.7 g/dL (ref 0.4–1.0)
B-Globulin SerPl Elph-Mcnc: 0.8 g/dL (ref 0.7–1.3)
Gamma Glob SerPl Elph-Mcnc: 0.9 g/dL (ref 0.4–1.8)
Globulin, Total: 2.7 g/dL (ref 2.2–3.9)
IgA/Immunoglobulin A, Serum: 157 mg/dL (ref 61–437)
IgG (Immunoglobin G), Serum: 923 mg/dL (ref 603–1613)
IgM (Immunoglobulin M), Srm: 48 mg/dL (ref 15–143)
Total Protein: 6.3 g/dL (ref 6.0–8.5)

## 2023-03-19 LAB — HEMOGLOBIN A1C
Est. average glucose Bld gHb Est-mCnc: 105 mg/dL
Hgb A1c MFr Bld: 5.3 % (ref 4.8–5.6)

## 2023-03-19 LAB — ANA,IFA RA DIAG PNL W/RFLX TIT/PATN
ANA Titer 1: NEGATIVE
Cyclic Citrullin Peptide Ab: 6 units (ref 0–19)
Rheumatoid fact SerPl-aCnc: <10 IU/mL (ref <14.0)

## 2023-03-19 LAB — TSH: TSH: 3.64 u[IU]/mL (ref 0.450–4.500)

## 2023-03-19 LAB — URIC ACID: Uric Acid: 6.5 mg/dL (ref 3.8–8.4)

## 2023-04-09 ENCOUNTER — Emergency Department (HOSPITAL_COMMUNITY): Payer: No Typology Code available for payment source

## 2023-04-09 ENCOUNTER — Encounter (HOSPITAL_COMMUNITY): Payer: Self-pay | Admitting: Emergency Medicine

## 2023-04-09 ENCOUNTER — Inpatient Hospital Stay (HOSPITAL_COMMUNITY)
Admission: EM | Admit: 2023-04-09 | Discharge: 2023-04-14 | DRG: 948 | Disposition: A | Discharge status: Skilled Nursing Facility | Payer: No Typology Code available for payment source | Attending: Internal Medicine | Admitting: Internal Medicine

## 2023-04-09 ENCOUNTER — Other Ambulatory Visit: Payer: Self-pay

## 2023-04-09 DIAGNOSIS — Z9049 Acquired absence of other specified parts of digestive tract: Secondary | ICD-10-CM

## 2023-04-09 DIAGNOSIS — I10 Essential (primary) hypertension: Secondary | ICD-10-CM | POA: Diagnosis present

## 2023-04-09 DIAGNOSIS — Z888 Allergy status to other drugs, medicaments and biological substances status: Secondary | ICD-10-CM

## 2023-04-09 DIAGNOSIS — D72819 Decreased white blood cell count, unspecified: Secondary | ICD-10-CM | POA: Diagnosis present

## 2023-04-09 DIAGNOSIS — N1832 Chronic kidney disease, stage 3b: Secondary | ICD-10-CM | POA: Diagnosis present

## 2023-04-09 DIAGNOSIS — Z9181 History of falling: Secondary | ICD-10-CM

## 2023-04-09 DIAGNOSIS — K219 Gastro-esophageal reflux disease without esophagitis: Secondary | ICD-10-CM | POA: Diagnosis present

## 2023-04-09 DIAGNOSIS — I25118 Atherosclerotic heart disease of native coronary artery with other forms of angina pectoris: Secondary | ICD-10-CM | POA: Diagnosis present

## 2023-04-09 DIAGNOSIS — I252 Old myocardial infarction: Secondary | ICD-10-CM

## 2023-04-09 DIAGNOSIS — G629 Polyneuropathy, unspecified: Secondary | ICD-10-CM | POA: Diagnosis present

## 2023-04-09 DIAGNOSIS — E538 Deficiency of other specified B group vitamins: Secondary | ICD-10-CM | POA: Diagnosis present

## 2023-04-09 DIAGNOSIS — E669 Obesity, unspecified: Secondary | ICD-10-CM | POA: Diagnosis present

## 2023-04-09 DIAGNOSIS — R4182 Altered mental status, unspecified: Principal | ICD-10-CM | POA: Diagnosis present

## 2023-04-09 DIAGNOSIS — Z7902 Long term (current) use of antithrombotics/antiplatelets: Secondary | ICD-10-CM

## 2023-04-09 DIAGNOSIS — N1831 Chronic kidney disease, stage 3a: Secondary | ICD-10-CM | POA: Diagnosis present

## 2023-04-09 DIAGNOSIS — N400 Enlarged prostate without lower urinary tract symptoms: Secondary | ICD-10-CM | POA: Diagnosis present

## 2023-04-09 DIAGNOSIS — I5032 Chronic diastolic (congestive) heart failure: Secondary | ICD-10-CM | POA: Diagnosis present

## 2023-04-09 DIAGNOSIS — Z79899 Other long term (current) drug therapy: Secondary | ICD-10-CM

## 2023-04-09 DIAGNOSIS — R404 Transient alteration of awareness: Secondary | ICD-10-CM | POA: Diagnosis not present

## 2023-04-09 DIAGNOSIS — F3340 Major depressive disorder, recurrent, in remission, unspecified: Secondary | ICD-10-CM | POA: Diagnosis present

## 2023-04-09 DIAGNOSIS — Z8673 Personal history of transient ischemic attack (TIA), and cerebral infarction without residual deficits: Secondary | ICD-10-CM

## 2023-04-09 DIAGNOSIS — Z95810 Presence of automatic (implantable) cardiac defibrillator: Secondary | ICD-10-CM

## 2023-04-09 DIAGNOSIS — I25708 Atherosclerosis of coronary artery bypass graft(s), unspecified, with other forms of angina pectoris: Secondary | ICD-10-CM | POA: Diagnosis present

## 2023-04-09 DIAGNOSIS — Z905 Acquired absence of kidney: Secondary | ICD-10-CM

## 2023-04-09 DIAGNOSIS — Z91013 Allergy to seafood: Secondary | ICD-10-CM

## 2023-04-09 DIAGNOSIS — G8929 Other chronic pain: Secondary | ICD-10-CM | POA: Diagnosis present

## 2023-04-09 DIAGNOSIS — Z85528 Personal history of other malignant neoplasm of kidney: Secondary | ICD-10-CM

## 2023-04-09 DIAGNOSIS — I480 Paroxysmal atrial fibrillation: Secondary | ICD-10-CM | POA: Diagnosis present

## 2023-04-09 DIAGNOSIS — Z7901 Long term (current) use of anticoagulants: Secondary | ICD-10-CM

## 2023-04-09 DIAGNOSIS — Z8249 Family history of ischemic heart disease and other diseases of the circulatory system: Secondary | ICD-10-CM

## 2023-04-09 DIAGNOSIS — M452 Ankylosing spondylitis of cervical region: Secondary | ICD-10-CM | POA: Diagnosis present

## 2023-04-09 DIAGNOSIS — R296 Repeated falls: Secondary | ICD-10-CM | POA: Diagnosis present

## 2023-04-09 DIAGNOSIS — R269 Unspecified abnormalities of gait and mobility: Secondary | ICD-10-CM | POA: Diagnosis present

## 2023-04-09 DIAGNOSIS — T426X5A Adverse effect of other antiepileptic and sedative-hypnotic drugs, initial encounter: Secondary | ICD-10-CM | POA: Diagnosis present

## 2023-04-09 DIAGNOSIS — Z87892 Personal history of anaphylaxis: Secondary | ICD-10-CM

## 2023-04-09 DIAGNOSIS — Y92002 Bathroom of unspecified non-institutional (private) residence single-family (private) house as the place of occurrence of the external cause: Secondary | ICD-10-CM

## 2023-04-09 DIAGNOSIS — M545 Low back pain, unspecified: Secondary | ICD-10-CM | POA: Diagnosis present

## 2023-04-09 DIAGNOSIS — M109 Gout, unspecified: Secondary | ICD-10-CM | POA: Diagnosis present

## 2023-04-09 DIAGNOSIS — E876 Hypokalemia: Secondary | ICD-10-CM | POA: Diagnosis present

## 2023-04-09 DIAGNOSIS — F419 Anxiety disorder, unspecified: Secondary | ICD-10-CM | POA: Diagnosis present

## 2023-04-09 DIAGNOSIS — W1830XA Fall on same level, unspecified, initial encounter: Secondary | ICD-10-CM | POA: Diagnosis present

## 2023-04-09 DIAGNOSIS — Z823 Family history of stroke: Secondary | ICD-10-CM

## 2023-04-09 DIAGNOSIS — R001 Bradycardia, unspecified: Secondary | ICD-10-CM | POA: Diagnosis present

## 2023-04-09 DIAGNOSIS — Z602 Problems related to living alone: Secondary | ICD-10-CM | POA: Diagnosis present

## 2023-04-09 DIAGNOSIS — M25521 Pain in right elbow: Secondary | ICD-10-CM | POA: Diagnosis present

## 2023-04-09 DIAGNOSIS — I130 Hypertensive heart and chronic kidney disease with heart failure and stage 1 through stage 4 chronic kidney disease, or unspecified chronic kidney disease: Secondary | ICD-10-CM | POA: Diagnosis present

## 2023-04-09 DIAGNOSIS — I5033 Acute on chronic diastolic (congestive) heart failure: Secondary | ICD-10-CM | POA: Diagnosis present

## 2023-04-09 DIAGNOSIS — Z6830 Body mass index (BMI) 30.0-30.9, adult: Secondary | ICD-10-CM

## 2023-04-09 DIAGNOSIS — Z87891 Personal history of nicotine dependence: Secondary | ICD-10-CM

## 2023-04-09 DIAGNOSIS — E785 Hyperlipidemia, unspecified: Secondary | ICD-10-CM | POA: Diagnosis present

## 2023-04-09 LAB — COMPREHENSIVE METABOLIC PANEL
ALT: 10 U/L (ref 0–44)
AST: 13 U/L — ABNORMAL LOW (ref 15–41)
Albumin: 3.7 g/dL (ref 3.5–5.0)
Alkaline Phosphatase: 79 U/L (ref 38–126)
Anion gap: 12 (ref 5–15)
BUN: 20 mg/dL (ref 8–23)
CO2: 25 mmol/L (ref 22–32)
Calcium: 9.4 mg/dL (ref 8.9–10.3)
Chloride: 105 mmol/L (ref 98–111)
Creatinine, Ser: 2.18 mg/dL — ABNORMAL HIGH (ref 0.61–1.24)
GFR, Estimated: 30 mL/min — ABNORMAL LOW (ref >60)
Glucose, Bld: 92 mg/dL (ref 70–99)
Potassium: 3.4 mmol/L — ABNORMAL LOW (ref 3.5–5.1)
Sodium: 142 mmol/L (ref 135–145)
Total Bilirubin: 1 mg/dL (ref 0.3–1.2)
Total Protein: 6.7 g/dL (ref 6.5–8.1)

## 2023-04-09 LAB — URINALYSIS, ROUTINE W REFLEX MICROSCOPIC
Bilirubin Urine: NEGATIVE
Glucose, UA: NEGATIVE mg/dL
Hgb urine dipstick: NEGATIVE
Ketones, ur: NEGATIVE mg/dL
Leukocytes,Ua: NEGATIVE
Nitrite: NEGATIVE
Protein, ur: NEGATIVE mg/dL
Specific Gravity, Urine: 1.006 (ref 1.005–1.030)
pH: 5 (ref 5.0–8.0)

## 2023-04-09 LAB — CBC WITH DIFFERENTIAL/PLATELET
Abs Immature Granulocytes: 0.02 10*3/uL (ref 0.00–0.07)
Basophils Absolute: 0 10*3/uL (ref 0.0–0.1)
Basophils Relative: 1 %
Eosinophils Absolute: 0.1 10*3/uL (ref 0.0–0.5)
Eosinophils Relative: 1 %
HCT: 34.5 % — ABNORMAL LOW (ref 39.0–52.0)
Hemoglobin: 10.8 g/dL — ABNORMAL LOW (ref 13.0–17.0)
Immature Granulocytes: 1 %
Lymphocytes Relative: 21 %
Lymphs Abs: 0.8 10*3/uL (ref 0.7–4.0)
MCH: 25.5 pg — ABNORMAL LOW (ref 26.0–34.0)
MCHC: 31.3 g/dL (ref 30.0–36.0)
MCV: 81.6 fL (ref 80.0–100.0)
Monocytes Absolute: 0.3 10*3/uL (ref 0.1–1.0)
Monocytes Relative: 8 %
Neutro Abs: 2.7 10*3/uL (ref 1.7–7.7)
Neutrophils Relative %: 68 %
Platelets: 205 10*3/uL (ref 150–400)
RBC: 4.23 MIL/uL (ref 4.22–5.81)
RDW: 15.6 % — ABNORMAL HIGH (ref 11.5–15.5)
WBC: 3.9 10*3/uL — ABNORMAL LOW (ref 4.0–10.5)
nRBC: 0 % (ref 0.0–0.2)

## 2023-04-09 LAB — I-STAT CHEM 8, ED
BUN: 24 mg/dL — ABNORMAL HIGH (ref 8–23)
Calcium, Ion: 1.18 mmol/L (ref 1.15–1.40)
Chloride: 106 mmol/L (ref 98–111)
Creatinine, Ser: 2.3 mg/dL — ABNORMAL HIGH (ref 0.61–1.24)
Glucose, Bld: 92 mg/dL (ref 70–99)
HCT: 33 % — ABNORMAL LOW (ref 39.0–52.0)
Hemoglobin: 11.2 g/dL — ABNORMAL LOW (ref 13.0–17.0)
Potassium: 3.5 mmol/L (ref 3.5–5.1)
Sodium: 144 mmol/L (ref 135–145)
TCO2: 27 mmol/L (ref 22–32)

## 2023-04-09 LAB — CK: Total CK: 23 U/L — ABNORMAL LOW (ref 49–397)

## 2023-04-09 MED ORDER — ATORVASTATIN CALCIUM 80 MG PO TABS
80.0000 mg | ORAL_TABLET | Freq: Every day | ORAL | Status: DC
  Administered 2023-04-10 – 2023-04-13 (×4): 80 mg via ORAL
  Filled 2023-04-09 (×4): qty 1

## 2023-04-09 MED ORDER — POTASSIUM CHLORIDE CRYS ER 20 MEQ PO TBCR
20.0000 meq | EXTENDED_RELEASE_TABLET | Freq: Every day | ORAL | Status: DC
  Administered 2023-04-10 – 2023-04-14 (×5): 20 meq via ORAL
  Filled 2023-04-09 (×5): qty 1

## 2023-04-09 MED ORDER — FEBUXOSTAT 40 MG PO TABS
80.0000 mg | ORAL_TABLET | Freq: Every day | ORAL | Status: DC
  Administered 2023-04-10 – 2023-04-14 (×5): 80 mg via ORAL
  Filled 2023-04-09 (×5): qty 2

## 2023-04-09 MED ORDER — DOCUSATE SODIUM 100 MG PO CAPS
200.0000 mg | ORAL_CAPSULE | Freq: Every day | ORAL | Status: DC
  Administered 2023-04-14: 200 mg via ORAL
  Filled 2023-04-09 (×5): qty 2

## 2023-04-09 MED ORDER — PANTOPRAZOLE SODIUM 40 MG PO TBEC
40.0000 mg | DELAYED_RELEASE_TABLET | Freq: Every day | ORAL | Status: DC
  Administered 2023-04-10 – 2023-04-14 (×5): 40 mg via ORAL
  Filled 2023-04-09 (×5): qty 1

## 2023-04-09 MED ORDER — AMLODIPINE BESYLATE 10 MG PO TABS
10.0000 mg | ORAL_TABLET | Freq: Every day | ORAL | Status: DC
  Administered 2023-04-10 – 2023-04-14 (×5): 10 mg via ORAL
  Filled 2023-04-09 (×2): qty 1
  Filled 2023-04-09: qty 2
  Filled 2023-04-09 (×2): qty 1

## 2023-04-09 MED ORDER — CARVEDILOL 12.5 MG PO TABS
12.5000 mg | ORAL_TABLET | Freq: Two times a day (BID) | ORAL | Status: DC
  Administered 2023-04-10 – 2023-04-12 (×6): 12.5 mg via ORAL
  Filled 2023-04-09: qty 2
  Filled 2023-04-09 (×5): qty 1

## 2023-04-09 MED ORDER — ACETAMINOPHEN 325 MG PO TABS
650.0000 mg | ORAL_TABLET | Freq: Four times a day (QID) | ORAL | Status: DC | PRN
  Administered 2023-04-10: 650 mg via ORAL
  Filled 2023-04-09: qty 2

## 2023-04-09 MED ORDER — CLOPIDOGREL BISULFATE 75 MG PO TABS
75.0000 mg | ORAL_TABLET | Freq: Every day | ORAL | Status: DC
  Administered 2023-04-10 – 2023-04-14 (×5): 75 mg via ORAL
  Filled 2023-04-09 (×5): qty 1

## 2023-04-09 MED ORDER — TRAZODONE HCL 50 MG PO TABS
25.0000 mg | ORAL_TABLET | Freq: Every evening | ORAL | Status: DC | PRN
  Administered 2023-04-10 – 2023-04-12 (×3): 25 mg via ORAL
  Filled 2023-04-09 (×3): qty 1

## 2023-04-09 MED ORDER — ESCITALOPRAM OXALATE 20 MG PO TABS
20.0000 mg | ORAL_TABLET | Freq: Every day | ORAL | Status: DC
  Administered 2023-04-10 – 2023-04-13 (×4): 20 mg via ORAL
  Filled 2023-04-09 (×4): qty 1

## 2023-04-09 MED ORDER — VITAMIN D (ERGOCALCIFEROL) 1.25 MG (50000 UNIT) PO CAPS
50000.0000 [IU] | ORAL_CAPSULE | ORAL | Status: DC
  Administered 2023-04-10: 50000 [IU] via ORAL
  Filled 2023-04-09 (×2): qty 1

## 2023-04-09 MED ORDER — HYDRALAZINE HCL 50 MG PO TABS
50.0000 mg | ORAL_TABLET | Freq: Three times a day (TID) | ORAL | Status: DC
  Administered 2023-04-10 – 2023-04-11 (×5): 50 mg via ORAL
  Filled 2023-04-09 (×5): qty 1

## 2023-04-09 MED ORDER — ALBUTEROL SULFATE (2.5 MG/3ML) 0.083% IN NEBU: 2.5000 mg | INHALATION_SOLUTION | Freq: Three times a day (TID) | RESPIRATORY_TRACT | Status: DC | PRN

## 2023-04-09 MED ORDER — SODIUM CHLORIDE 0.9 % IV SOLN: 250.0000 mL | INTRAVENOUS | Status: DC | PRN

## 2023-04-09 MED ORDER — CALCITRIOL 0.25 MCG PO CAPS
0.2500 ug | ORAL_CAPSULE | Freq: Every day | ORAL | Status: DC
  Administered 2023-04-10 – 2023-04-14 (×5): 0.25 ug via ORAL
  Filled 2023-04-09 (×5): qty 1

## 2023-04-09 MED ORDER — SODIUM CHLORIDE 0.9% FLUSH: 3.0000 mL | INTRAVENOUS | Status: DC | PRN

## 2023-04-09 MED ORDER — SODIUM CHLORIDE 0.9% FLUSH
3.0000 mL | Freq: Two times a day (BID) | INTRAVENOUS | Status: DC
  Administered 2023-04-10 – 2023-04-14 (×8): 3 mL via INTRAVENOUS

## 2023-04-09 MED ORDER — FENTANYL CITRATE PF 50 MCG/ML IJ SOSY: 12.5000 ug | PREFILLED_SYRINGE | INTRAMUSCULAR | Status: DC | PRN

## 2023-04-09 MED ORDER — ACETAMINOPHEN 650 MG RE SUPP: 650.0000 mg | Freq: Four times a day (QID) | RECTAL | Status: DC | PRN

## 2023-04-09 MED ORDER — FUROSEMIDE 40 MG PO TABS
40.0000 mg | ORAL_TABLET | Freq: Two times a day (BID) | ORAL | Status: DC
  Administered 2023-04-10 – 2023-04-14 (×9): 40 mg via ORAL
  Filled 2023-04-09: qty 2
  Filled 2023-04-09 (×8): qty 1

## 2023-04-09 MED ORDER — APIXABAN 5 MG PO TABS
5.0000 mg | ORAL_TABLET | Freq: Two times a day (BID) | ORAL | Status: DC
  Administered 2023-04-10 – 2023-04-14 (×10): 5 mg via ORAL
  Filled 2023-04-09 (×10): qty 1

## 2023-04-09 MED ORDER — POLYETHYLENE GLYCOL 3350 17 G PO PACK: 17.0000 g | PACK | Freq: Every day | ORAL | Status: DC | PRN

## 2023-04-09 MED ORDER — ALLOPURINOL 100 MG PO TABS
100.0000 mg | ORAL_TABLET | Freq: Every day | ORAL | Status: DC
  Administered 2023-04-10 – 2023-04-14 (×5): 100 mg via ORAL
  Filled 2023-04-09 (×5): qty 1

## 2023-04-09 MED ORDER — LACTATED RINGERS IV BOLUS
1000.0000 mL | Freq: Once | INTRAVENOUS | Status: AC
  Administered 2023-04-09: 1000 mL via INTRAVENOUS

## 2023-04-09 MED ORDER — METOPROLOL TARTRATE 5 MG/5ML IV SOLN: 5.0000 mg | Freq: Four times a day (QID) | INTRAVENOUS | Status: DC | PRN

## 2023-04-09 MED ORDER — PRIMIDONE 50 MG PO TABS
100.0000 mg | ORAL_TABLET | Freq: Every day | ORAL | Status: DC
  Administered 2023-04-10 – 2023-04-13 (×4): 100 mg via ORAL
  Filled 2023-04-09 (×5): qty 2

## 2023-04-09 NOTE — Assessment & Plan Note (Signed)
On PPI

## 2023-04-09 NOTE — ED Notes (Signed)
Level 2 trauma activate by ED PA.

## 2023-04-09 NOTE — Assessment & Plan Note (Addendum)
Appears well compensated On carvedilol On amlodipine On Lasix On hydralazine

## 2023-04-09 NOTE — Hospital Course (Signed)
Patient is 77 year old male with history of paroxysmal A-fib on Eliquis, HTN, CVA on Plavix, CHF, CAD status post CABG, depression, with some ongoing neuropathy.  He was placed on gabapentin by neurologist outside of the [place] system approximately 3 weeks ago.  He does report his sleep is better and possibly has leg pain is better however since he started that medication he is fallen multiple times including today where he was found on the floor.  He was brought in as a level 2 trauma for fall on blood thinners and has been unable to walk.  Trauma scans were all negative except for swelling at the right elbow.  We are asked to admit overnight to see if he returns to his baseline by the morning.

## 2023-04-09 NOTE — Assessment & Plan Note (Signed)
Stable

## 2023-04-09 NOTE — ED Notes (Addendum)
Trauma Response Nurse Documentation   Alexander Austin is a 77 y.o. male arriving to [facility] ED via EMS  On clopidogrel 75 mg daily. Trauma was activated as a Level 2 by ED PA based on the following trauma criteria Elderly patients > 65 with head trauma on anti-coagulation (excluding ASA). Trauma team at the bedside on patient arrival.   Patient cleared for CT by Dr. Posey Rea. Pt transported to CT with trauma response nurse present to monitor. RN remained with the patient throughout their absence from the department for clinical observation.   GCS 14.  History   Past Medical History:  Diagnosis Date   A-fib    Anxiety    Benign prostate hyperplasia    Cancer    Kidney   Chronic kidney disease    Congestive heart failure (CHF)    CVA (cerebral infarction)    Depression    Dyspnea    GERD (gastroesophageal reflux disease)    Gout    Hematospermia 01/13/2012   History of myocardial infarction 1993   Hyperlipidemia    Hypertension    Sleep apnea    Stroke    Vitamin B 12 deficiency    Weakness of left leg 03/28/2020     Past Surgical History:  Procedure Laterality Date   ANGIOPLASTY     X 8    BACK SURGERY     CHOLECYSTECTOMY     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   ENDARTERECTOMY Left 09/20/2022   Procedure: ENDARTERECTOMY CAROTID WITH 1 CM X 6CM BIOLOGIC PATCH.;  Surgeon: Chuck Hint, MD;  Location: [facility] OR;  Service: Vascular;  Laterality: Left;   SAPHENOUS VEIN GRAFT RESECTION  02/05/15       Initial Focused Assessment (If applicable, or please see trauma documentation): - Pt repetitive and confused (slightly more so than normal according to family) - PERRLA 3's brisk - Scab to L forehead - c/o R hip pain  CT's Completed:   CT Head and CT C-Spine CT CAP w/o contrast (pt's GFR 27.7).  Interventions:  - CXR - Pelvic XR - R arm XR - R hip XR - 20G PIV to L FA - Trauma labs drawn  Plan for disposition:  Other   Consults completed:  none at  1630.  Event Summary: Pt lives in the basement of his ex-wife and her husband's house.  Family heard a noise and went to check on him, finding him on the floor in the bathroom.  Unknown LOC but pt is more confused than baseline per family.  Pt states he was placed on a med for his restless leg a few weeks ago that has gradually made him weaker.  Usually uses a wheeling walker to get around however it took 3 staff members to get him from the wheelchair to the stretcher.   Bedside handoff with ED RN Candace.    Janora Norlander  Trauma Response RN  Please call TRN at [phone] for further assistance.

## 2023-04-09 NOTE — ED Triage Notes (Signed)
Pt BIB EMS from home for a fall, pt lives downstairs of home. Family states they heard a noise and went to check on pt and was found in the floor in the bathroom. Unknown LOC, pt does not remember the fall. Unknown if hit head on Plavix. C/o back pain and R elbow pain with skin tear to R elbow. Family reports confusion at baseline but states he is more altered than normal and asking repetitive questions.  EMS VS:  134/74 HR 40's-90's with demand pacer CBG 99

## 2023-04-09 NOTE — Assessment & Plan Note (Signed)
Has history of partial nephrectomy due to renal cancer  Creatinine is 2.18 Baseline appears to be about 1.8 Avoid nephrotoxic agents Trend

## 2023-04-09 NOTE — Assessment & Plan Note (Signed)
Rate controlled, on Eliquis 

## 2023-04-09 NOTE — ED Provider Triage Note (Signed)
Emergency Medicine Provider Triage Evaluation Note  Alexander Austin , a 77 y.o. male  was evaluated in triage.  Pt complains of fall, altered mental status. Patient is currently oriented to self only, following commands. Unable to provide any history. According to nursing staff that got report from EMS, patient found on ground this am, on plavix. Unsure how long he was down or details of fall. Patient's only complaint is left elbow pain. He denies headache. He is apparently confused at baseline but is more altered today.  Review of Systems  Positive:  Negative:   Physical Exam  BP 123/61 (BP Location: Right Arm)   Pulse 69   Temp (!) 97.4 F (36.3 C) (Oral)   Resp 17   Ht  (1.88 m)   Wt 108.4 kg   SpO2 97%   BMI 30.68 kg/m  Gen:   Awake, no distress   Resp:  Normal effort  MSK:   Moves extremities without difficulty  Other:  Alert and oriented x 1, following commands, no focal deficits. Few small abrasions noted to the head. No deformity  Medical Decision Making  Medically screening exam initiated at 3:36 PM.  Appropriate orders placed.  STORM SOVINE was informed that the remainder of the evaluation will be completed by another provider, this initial triage assessment does not replace that evaluation, and the importance of remaining in the ED until their evaluation is complete.  Given patients confusion, unclear story, on Plavix, level 2 trauma activated. Nursing staff aware.    Silva Bandy, PA-C 04/09/23 1541

## 2023-04-09 NOTE — Assessment & Plan Note (Signed)
With poor gait, uses rollator at baseline Status post multiple falls since starting gabapentin Hold gabapentin PT to eval and treat

## 2023-04-09 NOTE — Assessment & Plan Note (Signed)
On beta-blocker, calcium channel blocker, alpha agonist

## 2023-04-09 NOTE — H&P (Signed)
History and Physical    Patient: Alexander Austin ZOX:[id number] DOB: December 29, 1945 DOA: 04/09/2023 DOS: the patient was seen and examined on 04/09/2023 PCP: [facility]  Patient coming from: Home  Chief Complaint:  Chief Complaint  Patient presents with   Fall   Altered Mental Status   HPI: Alexander Austin is a 77 y.o. male with medical history significant of   paroxysmal A-fib on Eliquis, HTN, CVA on Plavix, CHF, CAD status post CABG, depression, with some ongoing neuropathy.  He was placed on gabapentin by neurologist outside of the [place] system approximately 3 weeks ago.  He does report his sleep is better and possibly has leg pain is better however since he started that medication he is fallen multiple times including today where he was found on the floor.  He was brought in as a level 2 trauma for fall on blood thinners and has been unable to walk.  Trauma scans were all negative except for swelling at the right elbow.  We are asked to admit overnight to see if he returns to his baseline by the morning. Review of Systems: As mentioned in the history of present illness. All other systems reviewed and are negative. Past Medical History:  Diagnosis Date   A-fib    Anxiety    Benign prostate hyperplasia    Cancer    Kidney   Chronic kidney disease    Congestive heart failure (CHF)    CVA (cerebral infarction)    Depression    Dyspnea    GERD (gastroesophageal reflux disease)    Gout    Hematospermia 01/13/2012   History of myocardial infarction 1993   Hyperlipidemia    Hypertension    Sleep apnea    Stroke    Vitamin B 12 deficiency    Weakness of left leg 03/28/2020   Past Surgical History:  Procedure Laterality Date   ANGIOPLASTY     X 8    BACK SURGERY     CHOLECYSTECTOMY     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   ENDARTERECTOMY Left 09/20/2022   Procedure: ENDARTERECTOMY CAROTID WITH 1 CM X 6CM BIOLOGIC PATCH.;  Surgeon: Chuck Hint, MD;  Location: [facility]  OR;  Service: Vascular;  Laterality: Left;   SAPHENOUS VEIN GRAFT RESECTION  02/05/15   Social History:  reports that he quit smoking about 40 years ago. He has never used smokeless tobacco. He reports that he does not drink alcohol and does not use drugs.  Allergies  Allergen Reactions   Isosorbide Nitrate Anaphylaxis    Other reaction(s): Cardiovascular Arrest (ALLERGY/intolerance) Can take Sublingual Nitro.    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    Family History  Problem Relation Age of Onset   Hypertension Mother    Heart disease Mother    Stroke Mother    Hypertension Father     Prior to Admission medications   Medication Sig Start Date End Date Taking? Authorizing Provider  albuterol (VENTOLIN HFA) 108 (90 Base) MCG/ACT inhaler Inhale 2 puffs into the lungs 3 (three) times daily as needed for wheezing or shortness of breath. 07/26/22   [provider]  allopurinol (ZYLOPRIM) 100 MG tablet Take 100 mg by mouth daily. 08/02/14   [provider]  amLODipine (NORVASC) 10 MG tablet Take 10 mg by mouth daily.    [provider]  apixaban (ELIQUIS) 5 MG TABS tablet Take 5 mg by mouth 2 (  two) times daily.    [provider]  atorvastatin (LIPITOR) 80 MG tablet Take 80 mg by mouth at bedtime.    [provider]  calcitRIOL (ROCALTROL) 0.25 MCG capsule Take 0.25 mcg by mouth daily.     [provider]  carvedilol (COREG) 12.5 MG tablet Take 1 tablet (12.5 mg total) by mouth 2 (two) times daily with a meal. 09/23/22   Noralee Stain, DO  clopidogrel (PLAVIX) 75 MG tablet Take 75 mg by mouth daily. 09/30/20   [provider]  colchicine 0.6 MG tablet Take 0.5 tablets (0.3 mg total) by mouth See admin instructions. Take 0.3mg  on Monday, Wednesday, Friday, and Sunday. 11/15/22   Zannie Cove, MD  Docusate Sodium (DSS) 100 MG CAPS Take 200 mg by mouth daily. 02/02/22    [provider]  edoxaban (SAVAYSA) 60 MG TABS tablet Take 60 mg by mouth daily. 10/28/22   [provider]  escitalopram (LEXAPRO) 20 MG tablet Take 20 mg by mouth at bedtime. 02/25/21   [provider]  Febuxostat 80 MG TABS Take 80 mg by mouth daily. 10/21/20   [provider]  furosemide (LASIX) 40 MG tablet Take 1 tablet (40 mg total) by mouth 2 (two) times daily. 11/15/22   Zannie Cove, MD  gabapentin (NEURONTIN) 300 MG capsule Take 1 capsule (300 mg total) by mouth at bedtime. 03/14/23   Penumalli, Glenford Bayley, MD  hydrALAZINE (APRESOLINE) 50 MG tablet Take 50 mg by mouth 3 (three) times daily.    [provider]  pantoprazole (PROTONIX) 40 MG tablet Take 40 mg by mouth daily before breakfast.    [provider]  potassium chloride SA (KLOR-CON M) 20 MEQ tablet Take 1 tablet (20 mEq total) by mouth daily. 11/15/22   Zannie Cove, MD  primidone (MYSOLINE) 50 MG tablet Take 100 mg by mouth in the morning and at bedtime.     [provider]  traZODone (DESYREL) 50 MG tablet Take 25 mg by mouth at bedtime as needed for sleep. 07/02/22   [provider]  Vitamin D, Ergocalciferol, (DRISDOL) 1.25 MG (50000 UNIT) CAPS capsule Take 50,000 Units by mouth every 7 (seven) days. Sunday    [provider]  potassium chloride (KLOR-CON) 10 MEQ tablet Take 10 mEq by mouth daily. 09/29/22 11/15/22  [provider]    Physical Exam: Vitals:   04/09/23 1512 04/09/23 1520 04/09/23 1541 04/09/23 1959  BP: 123/61  132/70 (!) 149/63  Pulse: 69  62 61  Resp: 17  18 (!) 25  Temp: (!) 97.4 F (36.3 C)     TempSrc: Oral     SpO2: 97%  99% 96%  Weight:  108.4 kg    Height:   (1.88 m)     Physical Examination: General appearance - alert, well appearing, and in no distress and oriented to person, place, and time Neck - supple, no significant adenopathy Chest - clear to auscultation, no wheezes, rales or rhonchi,  symmetric air entry Heart - normal rate, regular rhythm, normal S1, S2, no murmurs, rubs, clicks or gallops Abdomen - soft, nontender, nondistended, no masses or organomegaly Extremities - pedal edema 1+  Data Reviewed: Results for orders placed or performed during the hospital encounter of 04/09/23 (from the past 24 hour(s))  CBC with Differential     Status: Abnormal   Collection Time: 04/09/23  3:41 PM  Result Value Ref Range   WBC 3.9 (L) 4.0 - 10.5 K/uL  RBC 4.23 4.22 - 5.81 MIL/uL   Hemoglobin 10.8 (L) 13.0 - 17.0 g/dL   HCT 16.1 (L) 09.6 - 04.5 %   MCV 81.6 80.0 - 100.0 fL   MCH 25.5 (L) 26.0 - 34.0 pg   MCHC 31.3 30.0 - 36.0 g/dL   RDW 40.9 (H) 81.1 - 91.4 %   Platelets 205 150 - 400 K/uL   nRBC 0.0 0.0 - 0.2 %   Neutrophils Relative % 68 %   Neutro Abs 2.7 1.7 - 7.7 K/uL   Lymphocytes Relative 21 %   Lymphs Abs 0.8 0.7 - 4.0 K/uL   Monocytes Relative 8 %   Monocytes Absolute 0.3 0.1 - 1.0 K/uL   Eosinophils Relative 1 %   Eosinophils Absolute 0.1 0.0 - 0.5 K/uL   Basophils Relative 1 %   Basophils Absolute 0.0 0.0 - 0.1 K/uL   Immature Granulocytes 1 %   Abs Immature Granulocytes 0.02 0.00 - 0.07 K/uL  Comprehensive metabolic panel     Status: Abnormal   Collection Time: 04/09/23  3:41 PM  Result Value Ref Range   Sodium 142 135 - 145 mmol/L   Potassium 3.4 (L) 3.5 - 5.1 mmol/L   Chloride 105 98 - 111 mmol/L   CO2 25 22 - 32 mmol/L   Glucose, Bld 92 70 - 99 mg/dL   BUN 20 8 - 23 mg/dL   Creatinine, Ser 7.82 (H) 0.61 - 1.24 mg/dL   Calcium 9.4 8.9 - 95.6 mg/dL   Total Protein 6.7 6.5 - 8.1 g/dL   Albumin 3.7 3.5 - 5.0 g/dL   AST 13 (L) 15 - 41 U/L   ALT 10 0 - 44 U/L   Alkaline Phosphatase 79 38 - 126 U/L   Total Bilirubin 1.0 0.3 - 1.2 mg/dL   GFR, Estimated 30 (L) >60 mL/min   Anion gap 12 5 - 15  CK     Status: Abnormal   Collection Time: 04/09/23  3:41 PM  Result Value Ref Range   Total CK 23 (L) 49 - 397 U/L  I-stat chem 8, ED (not at [facility], [facility] or  [facility])     Status: Abnormal   Collection Time: 04/09/23  3:51 PM  Result Value Ref Range   Sodium 144 135 - 145 mmol/L   Potassium 3.5 3.5 - 5.1 mmol/L   Chloride 106 98 - 111 mmol/L   BUN 24 (H) 8 - 23 mg/dL   Creatinine, Ser 2.13 (H) 0.61 - 1.24 mg/dL   Glucose, Bld 92 70 - 99 mg/dL   Calcium, Ion 0.86 5.78 - 1.40 mmol/L   TCO2 27 22 - 32 mmol/L   Hemoglobin 11.2 (L) 13.0 - 17.0 g/dL   HCT 46.9 (L) 62.9 - 52.8 %  Urinalysis, Routine w reflex microscopic -Urine, Clean Catch     Status: Abnormal   Collection Time: 04/09/23  8:32 PM  Result Value Ref Range   Color, Urine STRAW (A) YELLOW   APPearance CLEAR CLEAR   Specific Gravity, Urine 1.006 1.005 - 1.030   pH 5.0 5.0 - 8.0   Glucose, UA NEGATIVE NEGATIVE mg/dL   Hgb urine dipstick NEGATIVE NEGATIVE   Bilirubin Urine NEGATIVE NEGATIVE   Ketones, ur NEGATIVE NEGATIVE mg/dL   Protein, ur NEGATIVE NEGATIVE mg/dL   Nitrite NEGATIVE NEGATIVE   Leukocytes,Ua NEGATIVE NEGATIVE   CT CHEST ABDOMEN PELVIS WO CONTRAST  Result Date: 04/09/2023 CLINICAL DATA:  Larey Seat.  Polytrauma. EXAM: CT CHEST, ABDOMEN AND PELVIS WITHOUT CONTRAST  TECHNIQUE: Multidetector CT imaging of the chest, abdomen and pelvis was performed following the standard protocol without IV contrast. RADIATION DOSE REDUCTION: This exam was performed according to the departmental dose-optimization program which includes automated exposure control, adjustment of the mA and/or kV according to patient size and/or use of iterative reconstruction technique. COMPARISON:  01/30/2020 FINDINGS: CT CHEST FINDINGS Cardiovascular: The heart is normal in size. No pericardial effusion. There is tortuosity and calcification of the thoracic aorta but no aneurysm. Three-vessel coronary artery calcifications are noted along with prior surgical changes from coronary artery bypass surgery. Mediastinum/Nodes: No mediastinal or hilar mass or lymphadenopathy. Scattered calcified lymph nodes are noted. The  esophagus is unremarkable. Lungs/Pleura: Stable large calcified granuloma in the left upper lobe along with scattered small noncalcified granulomas. These are unchanged since 2019 and considered benign. No new pulmonary lesions or pulmonary nodules. Chronic pulmonary scarring changes. No pleural effusions or pleural lesions. Musculoskeletal: No chest wall mass, supraclavicular or axillary adenopathy. CT ABDOMEN PELVIS FINDINGS Hepatobiliary: No evidence of acute hepatic injury and no perihepatic hematoma. No hepatic lesions or intrahepatic biliary dilatation. No common bile duct dilatation. Pancreas: No acute pancreatic injury or peripancreatic fluid collection. No mass or inflammation. Spleen: Intact. No acute splenic injury or perisplenic hematoma. Small scattered calcified granulomas are noted. Adrenals/Urinary Tract: Stable 18 mm left adrenal gland nodule measuring 9 Hounsfield units and consistent with a benign adenoma. No further imaging evaluation or follow-up is necessary. The right adrenal gland is normal. Surgical changes from a prior partial right nephrectomy. No findings for recurrent tumor. No renal or obstructing ureteral calculi. The bladder is unremarkable. Stomach/Bowel: The stomach, duodenum, small bowel and colon are unremarkable. No acute inflammatory process, mass lesions or obstructive findings. The terminal ileum and appendix are normal. Vascular/Lymphatic: Stable atherosclerotic calcifications involving the aorta and branch vessels but no aneurysm. No mesenteric or retroperitoneal mass, adenopathy or hematoma. Reproductive: The prostate gland is mildly enlarged. The seminal vesicles are unremarkable. Other: No free air or free fluid. Musculoskeletal: The bony structures are intact. No hip or pelvic fractures. The pubic symphysis and SI joints are maintained. Degenerative changes involving the spine and remote postsurgical changes but no acute spine fracture. IMPRESSION: 1. No acute injury is  identified involving the chest, abdomen or pelvis. 2. Stable surgical changes from a prior partial right nephrectomy. No findings for recurrent tumor. 3. Stable left adrenal gland adenoma. 4. Stable pulmonary scarring changes and benign pulmonary nodules. 5. Stable atherosclerotic calcifications involving the aorta and branch vessels including the coronary arteries. Aortic Atherosclerosis (ICD10-I70.0). Electronically Signed   By: Rudie Meyer M.D.   On: 04/09/2023 16:59   CT Head Wo Contrast  Result Date: 04/09/2023 CLINICAL DATA:  Larey Seat. EXAM: CT HEAD WITHOUT CONTRAST CT CERVICAL SPINE WITHOUT CONTRAST TECHNIQUE: Multidetector CT imaging of the head and cervical spine was performed following the standard protocol without intravenous contrast. Multiplanar CT image reconstructions of the cervical spine were also generated. RADIATION DOSE REDUCTION: This exam was performed according to the departmental dose-optimization program which includes automated exposure control, adjustment of the mA and/or kV according to patient size and/or use of iterative reconstruction technique. COMPARISON:  CT scan 01/13/2023 FINDINGS: CT HEAD FINDINGS Brain: Stable age related cerebral atrophy, ventriculomegaly and periventricular white matter disease. No extra-axial fluid collections are identified. No CT findings for acute hemispheric infarction or intracranial hemorrhage. No mass lesions. The brainstem and cerebellum are normal. Vascular: Stable advanced atherosclerotic calcifications involving the major vascular structures but no aneurysm hyperdense  vessels. Skull: No acute skull fracture bone lesions. Sinuses/Orbits: The paranasal sinuses and mastoid air cells are clear. The globes are intact. Other: No scalp lesions or scalp hematoma. CT CERVICAL SPINE FINDINGS Alignment: Normal Skull base and vertebrae: No acute fracture. No primary bone lesion or focal pathologic process. Soft tissues and spinal canal: No prevertebral fluid  or swelling. No visible canal hematoma. Disc levels: The spinal canal is fairly generous. No significant canal stenosis. 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 No significant bony foraminal stenosis. 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 Upper chest: The lung apices are grossly clear. Other: No neck mass, adenopathy or hematoma. IMPRESSION: 1. Stable age related cerebral atrophy, ventriculomegaly and periventricular white matter disease. 2. No acute intracranial findings or skull fracture. 3. Normal alignment of the cervical spine without acute fracture. 4. 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 5. 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 Electronically Signed   By: Rudie Meyer M.D.   On: 04/09/2023 16:47   CT Cervical Spine Wo Contrast  Result Date: 04/09/2023 CLINICAL DATA:  Larey Seat. EXAM: CT HEAD WITHOUT CONTRAST CT CERVICAL SPINE WITHOUT CONTRAST TECHNIQUE: Multidetector CT imaging of the head and cervical spine was performed following the standard protocol without intravenous contrast. Multiplanar CT image reconstructions of the cervical spine were also generated. RADIATION DOSE REDUCTION: This exam was performed according to the departmental dose-optimization program which includes automated exposure control, adjustment of the mA and/or kV according to patient size and/or use of iterative reconstruction technique. COMPARISON:  CT scan 01/13/2023 FINDINGS: CT HEAD FINDINGS Brain: Stable age related cerebral atrophy, ventriculomegaly and periventricular white matter disease. No extra-axial fluid collections are identified. No CT findings for acute hemispheric infarction or intracranial hemorrhage. No mass lesions. The brainstem and cerebellum are normal. Vascular: Stable advanced  atherosclerotic calcifications involving the major vascular structures but no aneurysm hyperdense vessels. Skull: No acute skull fracture bone lesions. Sinuses/Orbits: The paranasal sinuses and mastoid air cells are clear. The globes are intact. Other: No scalp lesions or scalp hematoma. CT CERVICAL SPINE FINDINGS Alignment: Normal Skull base and vertebrae: No acute fracture. No primary bone lesion or focal pathologic process. Soft tissues and spinal canal: No prevertebral fluid or swelling. No visible canal hematoma. Disc levels: The spinal canal is fairly generous. No significant canal stenosis. 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 No significant bony foraminal stenosis. 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 Upper chest: The lung apices are grossly clear. Other: No neck mass, adenopathy or hematoma. IMPRESSION: 1. Stable age related cerebral atrophy, ventriculomegaly and periventricular white matter disease. 2. No acute intracranial findings or skull fracture. 3. Normal alignment of the cervical spine without acute fracture. 4. 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 5. 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 Electronically Signed   By: Rudie Meyer M.D.   On: 04/09/2023 16:47   DG Hip Unilat W or Wo Pelvis 2-3 Views Left  Result Date: 04/09/2023 CLINICAL DATA:  Pain after fall EXAM: DG HIP (WITH OR WITHOUT PELVIS) 3V LEFT COMPARISON:  Abdomen pelvis CT 01/30/2020 FINDINGS: No fracture or dislocation. Mild degenerative changes seen of the hip joints. There  is a well corticated density overlying the posterior acetabulum of the left hip. This is stable going back to a CT scan of 01/30/2020 and could be sequela of old trauma. Degenerative changes of the lumbar spine. Overlapping cardiac  leads. IMPRESSION: Degenerative changes. Electronically Signed   By: Karen Kays M.D.   On: 04/09/2023 16:40   DG Elbow Complete Right  Result Date: 04/09/2023 CLINICAL DATA:  Fall EXAM: RIGHT ELBOW - COMPLETE 3+ VIEW COMPARISON:  None Available. FINDINGS: There is no evidence of acute fracture or dislocation. Slight elevation of the anterior fat pad is equivocal for joint effusion. Joint spaces are maintained. Small posterior olecranon enthesophyte. Mild soft tissue swelling. IMPRESSION: 1. No evidence of acute fracture or dislocation. 2. Slight elevation of the anterior fat pad is equivocal for joint effusion. Electronically Signed   By: Duanne Guess D.O.   On: 04/09/2023 16:37   DG Chest Portable 1 View  Result Date: 04/09/2023 CLINICAL DATA:  Pain after fall EXAM: PORTABLE CHEST 1 VIEW COMPARISON:  X-ray 01/13/2023 and older FINDINGS: Status post median sternotomy. Enlarged cardiopericardial silhouette. Left chest pacemaker/defibrillator with leads along the right side of the heart. No consolidation, pneumothorax or effusion. No edema. Dense left midlung nodule. Stable from older exams. IMPRESSION: Postop chest.  Defibrillator. Enlarged heart. Stable dense left midlung nodule Electronically Signed   By: Karen Kays M.D.   On: 04/09/2023 16:31    EKG, junctional rhythm  Assessment and Plan: * Altered mental state With poor gait, uses rollator at baseline Status post multiple falls since starting gabapentin Hold gabapentin PT to eval and treat  Acute on chronic diastolic CHF (congestive heart failure) Appears well compensated On carvedilol On amlodipine On Lasix On hydralazine   Essential hypertension, benign On beta-blocker, calcium channel blocker, alpha agonist  PAF (paroxysmal atrial fibrillation) Rate controlled, on Eliquis  Chronic kidney disease, stage 3a Has history of partial nephrectomy due to renal cancer  Creatinine is 2.18 Baseline appears to be about  1.8 Avoid nephrotoxic agents Trend  GERD (gastroesophageal reflux disease) On PPI  Major depressive disorder, recurrent, in remission (HCC) On Lexapro and trazodone  Vitamin B12 deficiency Check B12 level  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 (HCC) Stable      Advance Care Planning:   Code Status: Prior Full -has been DNI in the past but this is not available for ordering in epic.  Consults: None  Family Communication: Patient at bedside  Severity of Illness: The appropriate patient status for this patient is OBSERVATION. Observation status is judged to be reasonable and necessary in order to provide the required intensity of service to ensure the patient's safety. The patient's presenting symptoms, physical exam findings, and initial radiographic and laboratory data in the context of their medical condition is felt to place them at decreased risk for further clinical deterioration. Furthermore, it is anticipated that the patient will be medically stable for discharge from the hospital within 2 midnights of admission.   Author: Reva Bores, MD 04/09/2023 8:56 PM  For on call review [email or web address].

## 2023-04-09 NOTE — ED Provider Notes (Signed)
[place] EMERGENCY DEPARTMENT AT [facility] Provider Note  CSN: [id number] Arrival date & time: 04/09/23 1500  Chief Complaint(s) Fall and Altered Mental Status  HPI Alexander Austin is a 77 y.o. male with PMH paroxysmal A-fib, HTN, previous CVA on Plavix, CHF who presents emergency room for evaluation of a fall with altered mental status.  Patient arrives as a level 2 trauma for a fall on blood thinners with altered mental status.  History obtained from EMS and both the patient's ex-wife and patient's caretaker who states that the patient was using the bathroom and fell while in the bathroom.  They state that he was unable to get off the ground and patient has been acting abnormally.  Patient's caretaker Lupita Leash states that patient was started on gabapentin 300 nightly 2 weeks ago and since starting this medication his functional status has rapidly declined and patient is having significant difficulty with ambulation.  She states the confusion is new today however and the patient reportedly believes that he tripped over his dog who passed away many years ago and does not remember the events of today.  Here in the emergency room, patient endorsing back pain and headache, left elbow pain.   Past Medical History Past Medical History:  Diagnosis Date   A-fib    Anxiety    Benign prostate hyperplasia    Cancer    Kidney   Chronic kidney disease    Congestive heart failure (CHF)    CVA (cerebral infarction)    Depression    Dyspnea    GERD (gastroesophageal reflux disease)    Gout    Hematospermia 01/13/2012   History of myocardial infarction 1993   Hyperlipidemia    Hypertension    Sleep apnea    Stroke    Vitamin B 12 deficiency    Weakness of left leg 03/28/2020   Patient Active Problem List   Diagnosis Date Noted   Acute on chronic diastolic CHF (congestive heart failure) 11/08/2022   Status post carotid endarterectomy 11/08/2022   Acute on chronic combined systolic and  diastolic CHF (congestive heart failure) 11/08/2022   AICD (automatic cardioverter/defibrillator) present    Preoperative cardiovascular examination    Acute CVA (cerebrovascular accident) 09/13/2022   Internal carotid artery stenosis, left 09/13/2022   Skin wound from surgical incision 09/13/2022   Gout 07/03/2020   Gout flare 05/21/2020   Chronic diastolic CHF (congestive heart failure) 03/28/2020   Acute gout 03/07/2020   Chronic kidney disease, stage 3a 03/07/2020   Obesity (BMI 30.0-34.9) 03/07/2020   Tremor 04/16/2015   BPH (benign prostatic hyperplasia) 04/16/2015   Chronic anticoagulation 04/16/2015   Edema 04/16/2015   Urinary retention 03/06/2015   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 Authorizing Provider  albuterol (VENTOLIN HFA) 108 (90 Base) MCG/ACT inhaler Inhale 2 puffs into the lungs 3 (three) times daily as needed for wheezing or shortness of breath. 07/26/22   [provider]  allopurinol (ZYLOPRIM)  100 MG tablet Take 100 mg by mouth daily. 08/02/14   [provider]  amLODipine (NORVASC) 10 MG tablet Take 10 mg by mouth daily.    [provider]  apixaban (ELIQUIS) 5 MG TABS tablet Take 5 mg by mouth 2 (two) times daily.    [provider]  atorvastatin (LIPITOR) 80 MG tablet Take 80 mg by mouth at bedtime.    [provider]   calcitRIOL (ROCALTROL) 0.25 MCG capsule Take 0.25 mcg by mouth daily.     [provider]  carvedilol (COREG) 12.5 MG tablet Take 1 tablet (12.5 mg total) by mouth 2 (two) times daily with a meal. 09/23/22   Noralee Stain, DO  clopidogrel (PLAVIX) 75 MG tablet Take 75 mg by mouth daily. 09/30/20   [provider]  colchicine 0.6 MG tablet Take 0.5 tablets (0.3 mg total) by mouth See admin instructions. Take 0.3mg  on Monday, Wednesday, Friday, and Sunday. 11/15/22   Zannie Cove, MD  Docusate Sodium (DSS) 100 MG CAPS Take 200 mg by mouth daily. 02/02/22   [provider]  edoxaban (SAVAYSA) 60 MG TABS tablet Take 60 mg by mouth daily. 10/28/22   [provider]  escitalopram (LEXAPRO) 20 MG tablet Take 20 mg by mouth at bedtime. 02/25/21   [provider]  Febuxostat 80 MG TABS Take 80 mg by mouth daily. 10/21/20   [provider]  furosemide (LASIX) 40 MG tablet Take 1 tablet (40 mg total) by mouth 2 (two) times daily. 11/15/22   Zannie Cove, MD  gabapentin (NEURONTIN) 300 MG capsule Take 1 capsule (300 mg total) by mouth at bedtime. 03/14/23   Penumalli, Glenford Bayley, MD  hydrALAZINE (APRESOLINE) 50 MG tablet Take 50 mg by mouth 3 (three) times daily.    [provider]  pantoprazole (PROTONIX) 40 MG tablet Take 40 mg by mouth daily before breakfast.    [provider]  potassium chloride SA (KLOR-CON M) 20 MEQ tablet Take 1 tablet (20 mEq total) by mouth daily. 11/15/22   Zannie Cove, MD  primidone (MYSOLINE) 50 MG tablet Take 100 mg by mouth in the morning and at bedtime.     [provider]  traZODone (DESYREL) 50 MG tablet Take 25 mg by mouth at bedtime as needed for sleep. 07/02/22   [provider]  Vitamin D, Ergocalciferol, (DRISDOL) 1.25 MG (50000 UNIT) CAPS capsule Take 50,000 Units by mouth every 7 (seven) days. Sunday    [provider]  potassium chloride (KLOR-CON) 10 MEQ tablet Take 10  mEq by mouth daily. 09/29/22 11/15/22  [provider]                                                                                                                                    Past Surgical History Past Surgical History:  Procedure Laterality Date   ANGIOPLASTY     X 8  BACK SURGERY     CHOLECYSTECTOMY     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   ENDARTERECTOMY Left 09/20/2022   Procedure: ENDARTERECTOMY CAROTID WITH 1 CM X 6CM BIOLOGIC PATCH.;  Surgeon: Chuck Hint, MD;  Location: [facility] OR;  Service: Vascular;  Laterality: Left;   SAPHENOUS VEIN GRAFT RESECTION  02/05/15   Family History Family History  Problem Relation Age of Onset   Hypertension Mother    Heart disease Mother    Stroke Mother    Hypertension Father     Social History Social History   Tobacco Use   Smoking status: Former    Types: Cigarettes    Quit date: 07/24/1982    Years since quitting: 40.7   Smokeless tobacco: Never  Vaping Use   Vaping Use: Never used  Substance Use Topics   Alcohol use: No    Alcohol/week: 0.0 standard drinks of alcohol   Drug use: No   Allergies Isosorbide nitrate and Shellfish-derived products  Review of Systems Review of Systems  Musculoskeletal:  Positive for arthralgias and back pain.  Neurological:  Positive for headaches.  Psychiatric/Behavioral:  Positive for confusion.     Physical Exam Vital Signs  I have reviewed the triage vital signs BP 132/70   Pulse 62   Temp (!) 97.4 F (36.3 C) (Oral)   Resp 18   Ht 6\' 2"  (1.88 m)   Wt 108.4 kg   SpO2 99%   BMI 30.68 kg/m   Physical Exam Constitutional:      General: He is not in acute distress.    Appearance: Normal appearance.  HENT:     Head: Normocephalic and atraumatic.     Nose: No congestion or rhinorrhea.  Eyes:     General:        Right eye: No discharge.        Left eye: No discharge.     Extraocular Movements: Extraocular movements intact.     Pupils: Pupils are  equal, round, and reactive to light.  Cardiovascular:     Rate and Rhythm: Normal rate and regular rhythm.     Heart sounds: No murmur heard. Pulmonary:     Effort: No respiratory distress.     Breath sounds: No wheezing or rales.  Abdominal:     General: There is no distension.     Tenderness: There is no abdominal tenderness.  Musculoskeletal:        General: Tenderness present. Normal range of motion.     Cervical back: Normal range of motion.  Skin:    General: Skin is warm and dry.     Findings: Lesion present.  Neurological:     General: No focal deficit present.     Mental Status: He is alert. He is disoriented.     ED Results and Treatments Labs (all labs ordered are listed, but only abnormal results are displayed) Labs Reviewed  CBC WITH DIFFERENTIAL/PLATELET - Abnormal; Notable for the following components:      Result Value   WBC 3.9 (*)    Hemoglobin 10.8 (*)    HCT 34.5 (*)    MCH 25.5 (*)    RDW 15.6 (*)    All other components within normal limits  COMPREHENSIVE METABOLIC PANEL - Abnormal; Notable for the following components:   Potassium 3.4 (*)    Creatinine, Ser 2.18 (*)    AST 13 (*)    GFR, Estimated 30 (*)    All other components within normal  limits  CK - Abnormal; Notable for the following components:   Total CK 23 (*)    All other components within normal limits  I-STAT CHEM 8, ED - Abnormal; Notable for the following components:   BUN 24 (*)    Creatinine, Ser 2.30 (*)    Hemoglobin 11.2 (*)    HCT 33.0 (*)    All other components within normal limits  URINALYSIS, ROUTINE W REFLEX MICROSCOPIC                                                                                                                          Radiology CT CHEST ABDOMEN PELVIS WO CONTRAST  Result Date: 04/09/2023 CLINICAL DATA:  Larey Seat.  Polytrauma. EXAM: CT CHEST, ABDOMEN AND PELVIS WITHOUT CONTRAST TECHNIQUE: Multidetector CT imaging of the chest, abdomen and pelvis was  performed following the standard protocol without IV contrast. RADIATION DOSE REDUCTION: This exam was performed according to the departmental dose-optimization program which includes automated exposure control, adjustment of the mA and/or kV according to patient size and/or use of iterative reconstruction technique. COMPARISON:  01/30/2020 FINDINGS: CT CHEST FINDINGS Cardiovascular: The heart is normal in size. No pericardial effusion. There is tortuosity and calcification of the thoracic aorta but no aneurysm. Three-vessel coronary artery calcifications are noted along with prior surgical changes from coronary artery bypass surgery. Mediastinum/Nodes: No mediastinal or hilar mass or lymphadenopathy. Scattered calcified lymph nodes are noted. The esophagus is unremarkable. Lungs/Pleura: Stable large calcified granuloma in the left upper lobe along with scattered small noncalcified granulomas. These are unchanged since 2019 and considered benign. No new pulmonary lesions or pulmonary nodules. Chronic pulmonary scarring changes. No pleural effusions or pleural lesions. Musculoskeletal: No chest wall mass, supraclavicular or axillary adenopathy. CT ABDOMEN PELVIS FINDINGS Hepatobiliary: No evidence of acute hepatic injury and no perihepatic hematoma. No hepatic lesions or intrahepatic biliary dilatation. No common bile duct dilatation. Pancreas: No acute pancreatic injury or peripancreatic fluid collection. No mass or inflammation. Spleen: Intact. No acute splenic injury or perisplenic hematoma. Small scattered calcified granulomas are noted. Adrenals/Urinary Tract: Stable 18 mm left adrenal gland nodule measuring 9 Hounsfield units and consistent with a benign adenoma. No further imaging evaluation or follow-up is necessary. The right adrenal gland is normal. Surgical changes from a prior partial right nephrectomy. No findings for recurrent tumor. No renal or obstructing ureteral calculi. The bladder is unremarkable.  Stomach/Bowel: The stomach, duodenum, small bowel and colon are unremarkable. No acute inflammatory process, mass lesions or obstructive findings. The terminal ileum and appendix are normal. Vascular/Lymphatic: Stable atherosclerotic calcifications involving the aorta and branch vessels but no aneurysm. No mesenteric or retroperitoneal mass, adenopathy or hematoma. Reproductive: The prostate gland is mildly enlarged. The seminal vesicles are unremarkable. Other: No free air or free fluid. Musculoskeletal: The bony structures are intact. No hip or pelvic fractures. The pubic symphysis and SI joints are maintained. Degenerative changes involving the spine and remote postsurgical changes but no acute spine fracture.  IMPRESSION: 1. No acute injury is identified involving the chest, abdomen or pelvis. 2. Stable surgical changes from a prior partial right nephrectomy. No findings for recurrent tumor. 3. Stable left adrenal gland adenoma. 4. Stable pulmonary scarring changes and benign pulmonary nodules. 5. Stable atherosclerotic calcifications involving the aorta and branch vessels including the coronary arteries. Aortic Atherosclerosis (ICD10-I70.0). Electronically Signed   By: Rudie Meyer M.D.   On: 04/09/2023 16:59   CT Head Wo Contrast  Result Date: 04/09/2023 CLINICAL DATA:  Larey Seat. EXAM: CT HEAD WITHOUT CONTRAST CT CERVICAL SPINE WITHOUT CONTRAST TECHNIQUE: Multidetector CT imaging of the head and cervical spine was performed following the standard protocol without intravenous contrast. Multiplanar CT image reconstructions of the cervical spine were also generated. RADIATION DOSE REDUCTION: This exam was performed according to the departmental dose-optimization program which includes automated exposure control, adjustment of the mA and/or kV according to patient size and/or use of iterative reconstruction technique. COMPARISON:  CT scan 01/13/2023 FINDINGS: CT HEAD FINDINGS Brain: Stable age related cerebral  atrophy, ventriculomegaly and periventricular white matter disease. No extra-axial fluid collections are identified. No CT findings for acute hemispheric infarction or intracranial hemorrhage. No mass lesions. The brainstem and cerebellum are normal. Vascular: Stable advanced atherosclerotic calcifications involving the major vascular structures but no aneurysm hyperdense vessels. Skull: No acute skull fracture bone lesions. Sinuses/Orbits: The paranasal sinuses and mastoid air cells are clear. The globes are intact. Other: No scalp lesions or scalp hematoma. CT CERVICAL SPINE FINDINGS Alignment: Normal Skull base and vertebrae: No acute fracture. No primary bone lesion or focal pathologic process. Soft tissues and spinal canal: No prevertebral fluid or swelling. No visible canal hematoma. Disc levels: The spinal canal is fairly generous. No significant canal stenosis. 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 No significant bony foraminal stenosis. 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 Upper chest: The lung apices are grossly clear. Other: No neck mass, adenopathy or hematoma. IMPRESSION: 1. Stable age related cerebral atrophy, ventriculomegaly and periventricular white matter disease. 2. No acute intracranial findings or skull fracture. 3. Normal alignment of the cervical spine without acute fracture. 4. 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 5. 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 Electronically Signed   By: Rudie Meyer M.D.   On: 04/09/2023 16:47   CT Cervical Spine Wo Contrast  Result Date: 04/09/2023 CLINICAL DATA:  Larey Seat. EXAM: CT HEAD WITHOUT CONTRAST CT CERVICAL SPINE WITHOUT CONTRAST TECHNIQUE: Multidetector CT imaging of the head and cervical spine was performed following the  standard protocol without intravenous contrast. Multiplanar CT image reconstructions of the cervical spine were also generated. RADIATION DOSE REDUCTION: This exam was performed according to the departmental dose-optimization program which includes automated exposure control, adjustment of the mA and/or kV according to patient size and/or use of iterative reconstruction technique. COMPARISON:  CT scan 01/13/2023 FINDINGS: CT HEAD FINDINGS Brain: Stable age related cerebral atrophy, ventriculomegaly and periventricular white matter disease. No extra-axial fluid collections are identified. No CT findings for acute hemispheric infarction or intracranial hemorrhage. No mass lesions. The brainstem and cerebellum are normal. Vascular: Stable advanced atherosclerotic calcifications involving the major vascular structures but no aneurysm hyperdense vessels. Skull: No acute skull fracture bone lesions. Sinuses/Orbits: The paranasal sinuses and mastoid air cells are clear. The globes are intact. Other: No scalp lesions or scalp  hematoma. CT CERVICAL SPINE FINDINGS Alignment: Normal Skull base and vertebrae: No acute fracture. No primary bone lesion or focal pathologic process. Soft tissues and spinal canal: No prevertebral fluid or swelling. No visible canal hematoma. Disc levels: The spinal canal is fairly generous. No significant canal stenosis. 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 No significant bony foraminal stenosis. 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 Upper chest: The lung apices are grossly clear. Other: No neck mass, adenopathy or hematoma. IMPRESSION: 1. Stable age related cerebral atrophy, ventriculomegaly and periventricular white matter disease. 2. No acute intracranial findings or skull fracture. 3. Normal alignment of the cervical spine without acute fracture. 4. Stable ossification of the  posterior longitudinal ligament at C3-4 with mild mass effect the ventral thecal sac. 5. 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 Electronically Signed   By: Rudie Meyer M.D.   On: 04/09/2023 16:47   DG Hip Unilat W or Wo Pelvis 2-3 Views Left  Result Date: 04/09/2023 CLINICAL DATA:  Pain after fall EXAM: DG HIP (WITH OR WITHOUT PELVIS) 3V LEFT COMPARISON:  Abdomen pelvis CT 01/30/2020 FINDINGS: No fracture or dislocation. Mild degenerative changes seen of the hip joints. There is a well corticated density overlying the posterior acetabulum of the left hip. This is stable going back to a CT scan of 01/30/2020 and could be sequela of old trauma. Degenerative changes of the lumbar spine. Overlapping cardiac leads. IMPRESSION: Degenerative changes. Electronically Signed   By: Karen Kays M.D.   On: 04/09/2023 16:40   DG Elbow Complete Right  Result Date: 04/09/2023 CLINICAL DATA:  Fall EXAM: RIGHT ELBOW - COMPLETE 3+ VIEW COMPARISON:  None Available. FINDINGS: There is no evidence of acute fracture or dislocation. Slight elevation of the anterior fat pad is equivocal for joint effusion. Joint spaces are maintained. Small posterior olecranon enthesophyte. Mild soft tissue swelling. IMPRESSION: 1. No evidence of acute fracture or dislocation. 2. Slight elevation of the anterior fat pad is equivocal for joint effusion. Electronically Signed   By: Duanne Guess D.O.   On: 04/09/2023 16:37   DG Chest Portable 1 View  Result Date: 04/09/2023 CLINICAL DATA:  Pain after fall EXAM: PORTABLE CHEST 1 VIEW COMPARISON:  X-ray 01/13/2023 and older FINDINGS: Status post median sternotomy. Enlarged cardiopericardial silhouette. Left chest pacemaker/defibrillator with leads along the right side of the heart. No consolidation, pneumothorax or effusion. No edema. Dense left midlung nodule. Stable from older exams. IMPRESSION: Postop chest.  Defibrillator. Enlarged heart. Stable dense  left midlung nodule Electronically Signed   By: Karen Kays M.D.   On: 04/09/2023 16:31    Pertinent labs & imaging results that were available during my care of the patient were reviewed by me and considered in my medical decision making (see MDM for details).  Medications Ordered in ED Medications  lactated ringers bolus 1,000 mL (1,000 mLs Intravenous New Bag/Given 04/09/23 1742)  Procedures .Critical Care  Performed by: Glendora Score, MD Authorized by: Glendora Score, MD   Critical care provider statement:    Critical care time (minutes):  30   Critical care was necessary to treat or prevent imminent or life-threatening deterioration of the following conditions:  Trauma   Critical care was time spent personally by me on the following activities:  Development of treatment plan with patient or surrogate, discussions with consultants, evaluation of patient's response to treatment, examination of patient, ordering and review of laboratory studies, ordering and review of radiographic studies, ordering and performing treatments and interventions, pulse oximetry, re-evaluation of patient's condition and review of old charts   (including critical care time)  Medical Decision Making / ED Course   This patient presents to the ED for concern of fall, confusion, this involves an extensive number of treatment options, and is a complaint that carries with it a high risk of complications and morbidity.  The differential diagnosis includes fracture, hematoma, closed head injury, polypharmacy, toxic encephalopathy, metabolic encephalopathy  MDM: Patient seen emergency room for evaluation of altered mental status and a fall on blood thinners.  Patient arrives a level 2 trauma, primary survey with a GCS of 14 for confusion.  Secondary survey with tenderness at the right  elbow, left hip, T and L-spine.  Laboratory evaluation with mild leukopenia to 4.0, hemoglobin 10.8, creatinine 2.18, potassium 3.4.  Trauma imaging including chest x-ray, elbow and hip x-rays, CT head, C-spine, chest abdomen pelvis with no acute traumatic findings.  On reevaluation, patient remains persistently disoriented and is unable to ambulate.  At this time, patient not safe for discharge and will require hospital mission for further workup.  Polypharmacy is certainly a consideration as his symptoms seem to have worsened with gabapentin use and this should be held hospital admission.  Therapy consult placed   Additional history obtained: -Additional history obtained from ex-wife, caretaker Lupita Leash [phone]) -External records from outside source obtained and reviewed including: Chart review including previous notes, labs, imaging, consultation notes   Lab Tests: -I ordered, reviewed, and interpreted labs.   The pertinent results include:   Labs Reviewed  CBC WITH DIFFERENTIAL/PLATELET - Abnormal; Notable for the following components:      Result Value   WBC 3.9 (*)    Hemoglobin 10.8 (*)    HCT 34.5 (*)    MCH 25.5 (*)    RDW 15.6 (*)    All other components within normal limits  COMPREHENSIVE METABOLIC PANEL - Abnormal; Notable for the following components:   Potassium 3.4 (*)    Creatinine, Ser 2.18 (*)    AST 13 (*)    GFR, Estimated 30 (*)    All other components within normal limits  CK - Abnormal; Notable for the following components:   Total CK 23 (*)    All other components within normal limits  I-STAT CHEM 8, ED - Abnormal; Notable for the following components:   BUN 24 (*)    Creatinine, Ser 2.30 (*)    Hemoglobin 11.2 (*)    HCT 33.0 (*)    All other components within normal limits  URINALYSIS, ROUTINE W REFLEX MICROSCOPIC      EKG   EKG Interpretation  Date/Time:  Saturday April 09 2023 17:30:05 EDT Ventricular Rate:  60 PR Interval:    QRS  Duration: 162 QT Interval:  507 QTC Calculation: 507 R Axis:   0 Text Interpretation: Junctional rhythm IVCD, consider atypical LBBB Confirmed by Eliora Nienhuis (  [id number]) on 04/09/2023 8:01:20 PM         Imaging Studies ordered: I ordered imaging studies including multiple x-rays, CT head, C-spine, chest abdomen pelvis I independently visualized and interpreted imaging. I agree with the radiologist interpretation   Medicines ordered and prescription drug management: Meds ordered this encounter  Medications   lactated ringers bolus 1,000 mL    -I have reviewed the patients home medicines and have made adjustments as needed  Critical interventions none    Cardiac Monitoring: The patient was maintained on a cardiac monitor.  I personally viewed and interpreted the cardiac monitored which showed an underlying rhythm of: AV paced rhythm  Social Determinants of Health:  Factors impacting patients care include: Lives in the basement of his ex-wife's home   Reevaluation: After the interventions noted above, I reevaluated the patient and found that they have :stayed the same  Co morbidities that complicate the patient evaluation  Past Medical History:  Diagnosis Date   A-fib    Anxiety    Benign prostate hyperplasia    Cancer    Kidney   Chronic kidney disease    Congestive heart failure (CHF)    CVA (cerebral infarction)    Depression    Dyspnea    GERD (gastroesophageal reflux disease)    Gout    Hematospermia 01/13/2012   History of myocardial infarction 1993   Hyperlipidemia    Hypertension    Sleep apnea    Stroke    Vitamin B 12 deficiency    Weakness of left leg 03/28/2020      Dispostion: I considered admission for this patient, and due to persistent confusion and altered mental status with inability to ambulate patient require hospital admission     Final Clinical Impression(s) / ED Diagnoses Final diagnoses:  None     @PCDICTATION @     Glendora Score, MD 04/09/23 2002

## 2023-04-09 NOTE — Assessment & Plan Note (Signed)
Check B12 level. 

## 2023-04-09 NOTE — Progress Notes (Signed)
   04/09/23 1515  Spiritual Encounters  Type of Visit Attempt (pt unavailable)  Care provided to: Pt not available  Conversation partners present during encounter Nurse  Referral source Trauma page  Reason for visit Code  OnCall Visit Yes   Responded to page, patient not available. Patient's ex-wife and grandson aware of hospitalization. Not known if they will visit hospital.  Patient lives in house down stairs from ex-wife and her spouse and communicates with grandson 3 times daily via phone.

## 2023-04-09 NOTE — Assessment & Plan Note (Signed)
On Lexapro and trazodone

## 2023-04-10 DIAGNOSIS — R296 Repeated falls: Secondary | ICD-10-CM | POA: Diagnosis not present

## 2023-04-10 LAB — CBC
HCT: 29.7 % — ABNORMAL LOW (ref 39.0–52.0)
HCT: 30.4 % — ABNORMAL LOW (ref 39.0–52.0)
Hemoglobin: 9.4 g/dL — ABNORMAL LOW (ref 13.0–17.0)
Hemoglobin: 9.4 g/dL — ABNORMAL LOW (ref 13.0–17.0)
MCH: 25.5 pg — ABNORMAL LOW (ref 26.0–34.0)
MCH: 25.2 pg — ABNORMAL LOW (ref 26.0–34.0)
MCHC: 31.6 g/dL (ref 30.0–36.0)
MCHC: 30.9 g/dL (ref 30.0–36.0)
MCV: 80.5 fL (ref 80.0–100.0)
MCV: 81.5 fL (ref 80.0–100.0)
Platelets: 163 10*3/uL (ref 150–400)
Platelets: 174 10*3/uL (ref 150–400)
RBC: 3.69 MIL/uL — ABNORMAL LOW (ref 4.22–5.81)
RBC: 3.73 MIL/uL — ABNORMAL LOW (ref 4.22–5.81)
RDW: 15.6 % — ABNORMAL HIGH (ref 11.5–15.5)
RDW: 15.7 % — ABNORMAL HIGH (ref 11.5–15.5)
WBC: 3.8 10*3/uL — ABNORMAL LOW (ref 4.0–10.5)
WBC: 3.8 10*3/uL — ABNORMAL LOW (ref 4.0–10.5)
nRBC: 0 % (ref 0.0–0.2)
nRBC: 0 % (ref 0.0–0.2)

## 2023-04-10 LAB — BASIC METABOLIC PANEL
Anion gap: 11 (ref 5–15)
Anion gap: 11 (ref 5–15)
BUN: 19 mg/dL (ref 8–23)
BUN: 19 mg/dL (ref 8–23)
CO2: 26 mmol/L (ref 22–32)
CO2: 25 mmol/L (ref 22–32)
Calcium: 9 mg/dL (ref 8.9–10.3)
Calcium: 9 mg/dL (ref 8.9–10.3)
Chloride: 106 mmol/L (ref 98–111)
Chloride: 106 mmol/L (ref 98–111)
Creatinine, Ser: 2.02 mg/dL — ABNORMAL HIGH (ref 0.61–1.24)
Creatinine, Ser: 1.97 mg/dL — ABNORMAL HIGH (ref 0.61–1.24)
GFR, Estimated: 33 mL/min — ABNORMAL LOW (ref >60)
GFR, Estimated: 34 mL/min — ABNORMAL LOW (ref >60)
Glucose, Bld: 86 mg/dL (ref 70–99)
Glucose, Bld: 95 mg/dL (ref 70–99)
Potassium: 2.8 mmol/L — ABNORMAL LOW (ref 3.5–5.1)
Potassium: 3 mmol/L — ABNORMAL LOW (ref 3.5–5.1)
Sodium: 143 mmol/L (ref 135–145)
Sodium: 142 mmol/L (ref 135–145)

## 2023-04-10 LAB — TSH: TSH: 2.012 u[IU]/mL (ref 0.350–4.500)

## 2023-04-10 LAB — MAGNESIUM: Magnesium: 1.9 mg/dL (ref 1.7–2.4)

## 2023-04-10 LAB — POTASSIUM: Potassium: 3.5 mmol/L (ref 3.5–5.1)

## 2023-04-10 LAB — VITAMIN B12: Vitamin B-12: 265 pg/mL (ref 180–914)

## 2023-04-10 MED ORDER — POTASSIUM CHLORIDE 10 MEQ/100ML IV SOLN
10.0000 meq | INTRAVENOUS | Status: AC
  Administered 2023-04-10 (×4): 10 meq via INTRAVENOUS
  Filled 2023-04-10: qty 100

## 2023-04-10 MED ORDER — ACETAMINOPHEN 325 MG PO TABS
650.0000 mg | ORAL_TABLET | Freq: Four times a day (QID) | ORAL | Status: DC
  Administered 2023-04-10 – 2023-04-14 (×13): 650 mg via ORAL
  Filled 2023-04-10 (×14): qty 2

## 2023-04-10 NOTE — Progress Notes (Signed)
PROGRESS NOTE    Alexander Austin  WUJ:[id number]  DOB: 17-Jan-1946  DOA: 04/09/2023 PCP: [facility]:   Hospital course:  77 year old with PAF on Eliquis, CVA on Plavix, CAD s/p CABG, CHF was admitted yesterday after multiple falls after [facility].  Trauma workup for falls on anticoagulation was negative except for swelling in elbow.  Subjective:  Patient states that he is in significant pain in lower back and anytime he moves his legs.  Notes that the gabapentin was somewhat effective with pain however he had weakness and was falling all the time.  Patient lives at home alone however does have home health aide 5 days a week for 5 hours at a time.  He notes his "landlord" lives in the same house above his apartment.   Objective: Vitals:   04/10/23 1200 04/10/23 1300 04/10/23 1427 04/10/23 1615  BP: (!) 146/59 (!) 141/66 (!) 140/67 (!) 153/71  Pulse: 60 60 62 60  Resp: (!) Temp:   97.8 F (36.6 C) 98.1 F (36.7 C)  TempSrc:   Oral Oral  SpO2: 98% 96% 98% 99%  Weight:      Height:        Intake/Output Summary (Last 24 hours) at 04/10/2023 1743 Last data filed at 04/10/2023 1355 Gross per 24 hour  Intake 300 ml  Output --  Net 300 ml   Filed Weights   04/09/23 1520  Weight: 108.4 kg     Exam:  General: Obese man lying in bed with ecchymoses on forearms and back. Eyes: sclera anicteric, conjuctiva mild injection bilaterally CVS: S1-S2, regular  Respiratory:  decreased air entry bilaterally secondary to decreased inspiratory effort, rales at bases  GI: NABS, obese, nondistended, firm but not hard, no tenderness to light or deep palpation. LE: Multiple ecchymoses, 1+ edema bilaterally.  Patient with difficulty moving his lower extremities due to pain and stiffness.    Data Reviewed:  Basic Metabolic Panel: Recent Labs  Lab 04/09/23 1541 04/09/23 1551 04/10/23 0226  04/10/23 1054  NA 142 144 143 142  K 3.4* 3.5 2.8* 3.0*  CL 105 106 106 106  CO2 25  --  26 25  GLUCOSE 92 92 86 95  BUN 20 24* 19 19  CREATININE 2.18* 2.30* 2.02* 1.97*  CALCIUM 9.4  --  9.0 9.0    CBC: Recent Labs  Lab 04/09/23 1541 04/09/23 1551 04/10/23 0226 04/10/23 1054  WBC 3.9*  --  3.8* 3.8*  NEUTROABS 2.7  --   --   --   HGB 10.8* 11.2* 9.4* 9.4*  HCT 34.5* 33.0* 29.7* 30.4*  MCV 81.6  --  80.5 81.5  PLT 205  --  163 174     Scheduled Meds:  allopurinol  100 mg Oral Daily   amLODipine  10 mg Oral Daily   apixaban  5 mg Oral BID   atorvastatin  80 mg Oral QHS   calcitRIOL  0.25 mcg Oral Daily   carvedilol  12.5 mg Oral BID WC   clopidogrel  75 mg Oral Daily   docusate sodium  200 mg Oral Daily   escitalopram  20 mg Oral QHS   febuxostat  80 mg Oral Daily   furosemide  40 mg Oral BID   hydrALAZINE  50 mg Oral TID   pantoprazole  40 mg Oral QAC breakfast   potassium chloride SA  20  mEq Oral Daily   primidone  100 mg Oral QHS   sodium chloride flush  3 mL Intravenous Q12H   Vitamin D (Ergocalciferol)  50,000 Units Oral Q7 days   Continuous Infusions:  sodium chloride       Assessment & Plan:   Gait abnormality Falls Chronic pain/chronic LBP Ankylosing spondylitis Very much appreciate PT evaluation and ongoing treatment Patient with significant LBP with attempting to sit up Patient with ankylosing spondylitis seen on cervical spine Gabapentin appears to have caused increased falls agree with discontinuing No NSAIDs given CKD Would like to avoid narcotics if possible  Will start patient on standing Tylenol 650 p.o. every 6 hours Can add hydrocodone if this is insufficiently effective PT recommending SNF/rehab for discharge  Hypokalemia Remains low despite aggressive repletion Recheck K and Mg tonight and continue repletion as warranted  CKD 3 A Creatinine close to baseline and improving Total CK23, no evidence of rhabdo Avoid nephrotoxic  agents  PAF Rate controlled on carvedilol Stroke prevention on Eliquis  HTN Under reasonable control on present meds  Major depressive disorder Doing well on Lexapro and trazodone  CAD Continue Plavix, carvedilol and atorvastatin   DVT prophylaxis: Eliquis Code Status: Full Family Communication: None today     Studies: CT CHEST ABDOMEN PELVIS WO CONTRAST  Result Date: 04/09/2023 CLINICAL DATA:  Larey Seat.  Polytrauma. EXAM: CT CHEST, ABDOMEN AND PELVIS WITHOUT CONTRAST TECHNIQUE: Multidetector CT imaging of the chest, abdomen and pelvis was performed following the standard protocol without IV contrast. RADIATION DOSE REDUCTION: This exam was performed according to the departmental dose-optimization program which includes automated exposure control, adjustment of the mA and/or kV according to patient size and/or use of iterative reconstruction technique. COMPARISON:  01/30/2020 FINDINGS: CT CHEST FINDINGS Cardiovascular: The heart is normal in size. No pericardial effusion. There is tortuosity and calcification of the thoracic aorta but no aneurysm. Three-vessel coronary artery calcifications are noted along with prior surgical changes from coronary artery bypass surgery. Mediastinum/Nodes: No mediastinal or hilar mass or lymphadenopathy. Scattered calcified lymph nodes are noted. The esophagus is unremarkable. Lungs/Pleura: Stable large calcified granuloma in the left upper lobe along with scattered small noncalcified granulomas. These are unchanged since 2019 and considered benign. No new pulmonary lesions or pulmonary nodules. Chronic pulmonary scarring changes. No pleural effusions or pleural lesions. Musculoskeletal: No chest wall mass, supraclavicular or axillary adenopathy. CT ABDOMEN PELVIS FINDINGS Hepatobiliary: No evidence of acute hepatic injury and no perihepatic hematoma. No hepatic lesions or intrahepatic biliary dilatation. No common bile duct dilatation. Pancreas: No acute  pancreatic injury or peripancreatic fluid collection. No mass or inflammation. Spleen: Intact. No acute splenic injury or perisplenic hematoma. Small scattered calcified granulomas are noted. Adrenals/Urinary Tract: Stable 18 mm left adrenal gland nodule measuring 9 Hounsfield units and consistent with a benign adenoma. No further imaging evaluation or follow-up is necessary. The right adrenal gland is normal. Surgical changes from a prior partial right nephrectomy. No findings for recurrent tumor. No renal or obstructing ureteral calculi. The bladder is unremarkable. Stomach/Bowel: The stomach, duodenum, small bowel and colon are unremarkable. No acute inflammatory process, mass lesions or obstructive findings. The terminal ileum and appendix are normal. Vascular/Lymphatic: Stable atherosclerotic calcifications involving the aorta and branch vessels but no aneurysm. No mesenteric or retroperitoneal mass, adenopathy or hematoma. Reproductive: The prostate gland is mildly enlarged. The seminal vesicles are unremarkable. Other: No free air or free fluid. Musculoskeletal: The bony structures are intact. No hip or pelvic fractures. The pubic symphysis  and SI joints are maintained. Degenerative changes involving the spine and remote postsurgical changes but no acute spine fracture. IMPRESSION: 1. No acute injury is identified involving the chest, abdomen or pelvis. 2. Stable surgical changes from a prior partial right nephrectomy. No findings for recurrent tumor. 3. Stable left adrenal gland adenoma. 4. Stable pulmonary scarring changes and benign pulmonary nodules. 5. Stable atherosclerotic calcifications involving the aorta and branch vessels including the coronary arteries. Aortic Atherosclerosis (ICD10-I70.0). Electronically Signed   By: Rudie Meyer M.D.   On: 04/09/2023 16:59   CT Head Wo Contrast  Result Date: 04/09/2023 CLINICAL DATA:  Larey Seat. EXAM: CT HEAD WITHOUT CONTRAST CT CERVICAL SPINE WITHOUT CONTRAST  TECHNIQUE: Multidetector CT imaging of the head and cervical spine was performed following the standard protocol without intravenous contrast. Multiplanar CT image reconstructions of the cervical spine were also generated. RADIATION DOSE REDUCTION: This exam was performed according to the departmental dose-optimization program which includes automated exposure control, adjustment of the mA and/or kV according to patient size and/or use of iterative reconstruction technique. COMPARISON:  CT scan 01/13/2023 FINDINGS: CT HEAD FINDINGS Brain: Stable age related cerebral atrophy, ventriculomegaly and periventricular white matter disease. No extra-axial fluid collections are identified. No CT findings for acute hemispheric infarction or intracranial hemorrhage. No mass lesions. The brainstem and cerebellum are normal. Vascular: Stable advanced atherosclerotic calcifications involving the major vascular structures but no aneurysm hyperdense vessels. Skull: No acute skull fracture bone lesions. Sinuses/Orbits: The paranasal sinuses and mastoid air cells are clear. The globes are intact. Other: No scalp lesions or scalp hematoma. CT CERVICAL SPINE FINDINGS Alignment: Normal Skull base and vertebrae: No acute fracture. No primary bone lesion or focal pathologic process. Soft tissues and spinal canal: No prevertebral fluid or swelling. No visible canal hematoma. Disc levels: The spinal canal is fairly generous. No significant canal stenosis. 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 No significant bony foraminal stenosis. 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 Upper chest: The lung apices are grossly clear. Other: No neck mass, adenopathy or hematoma. IMPRESSION: 1. Stable age related cerebral atrophy, ventriculomegaly and periventricular white matter disease. 2. No acute intracranial findings or skull fracture.  3. Normal alignment of the cervical spine without acute fracture. 4. 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 5. 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 Electronically Signed   By: Rudie Meyer M.D.   On: 04/09/2023 16:47   CT Cervical Spine Wo Contrast  Result Date: 04/09/2023 CLINICAL DATA:  Larey Seat. EXAM: CT HEAD WITHOUT CONTRAST CT CERVICAL SPINE WITHOUT CONTRAST TECHNIQUE: Multidetector CT imaging of the head and cervical spine was performed following the standard protocol without intravenous contrast. Multiplanar CT image reconstructions of the cervical spine were also generated. RADIATION DOSE REDUCTION: This exam was performed according to the departmental dose-optimization program which includes automated exposure control, adjustment of the mA and/or kV according to patient size and/or use of iterative reconstruction technique. COMPARISON:  CT scan 01/13/2023 FINDINGS: CT HEAD FINDINGS Brain: Stable age related cerebral atrophy, ventriculomegaly and periventricular white matter disease. No extra-axial fluid collections are identified. No CT findings for acute hemispheric infarction or intracranial hemorrhage. No mass lesions. The brainstem and cerebellum are normal. Vascular: Stable advanced atherosclerotic calcifications involving the major vascular structures but no aneurysm hyperdense vessels. Skull: No acute skull fracture bone lesions. Sinuses/Orbits:  The paranasal sinuses and mastoid air cells are clear. The globes are intact. Other: No scalp lesions or scalp hematoma. CT CERVICAL SPINE FINDINGS Alignment: Normal Skull base and vertebrae: No acute fracture. No primary bone lesion or focal pathologic process. Soft tissues and spinal canal: No prevertebral fluid or swelling. No visible canal hematoma. Disc levels: The spinal canal is fairly generous. No significant canal stenosis. Stable  ossification of the posterior longitudinal ligament at C3-4 with mild mass effect the ventral thecal sac. No significant bony foraminal stenosis. 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 Upper chest: The lung apices are grossly clear. Other: No neck mass, adenopathy or hematoma. IMPRESSION: 1. Stable age related cerebral atrophy, ventriculomegaly and periventricular white matter disease. 2. No acute intracranial findings or skull fracture. 3. Normal alignment of the cervical spine without acute fracture. 4. 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 5. 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 Electronically Signed   By: Rudie Meyer M.D.   On: 04/09/2023 16:47   DG Hip Unilat W or Wo Pelvis 2-3 Views Left  Result Date: 04/09/2023 CLINICAL DATA:  Pain after fall EXAM: DG HIP (WITH OR WITHOUT PELVIS) 3V LEFT COMPARISON:  Abdomen pelvis CT 01/30/2020 FINDINGS: No fracture or dislocation. Mild degenerative changes seen of the hip joints. There is a well corticated density overlying the posterior acetabulum of the left hip. This is stable going back to a CT scan of 01/30/2020 and could be sequela of old trauma. Degenerative changes of the lumbar spine. Overlapping cardiac leads. IMPRESSION: Degenerative changes. Electronically Signed   By: Karen Kays M.D.   On: 04/09/2023 16:40   DG Elbow Complete Right  Result Date: 04/09/2023 CLINICAL DATA:  Fall EXAM: RIGHT ELBOW - COMPLETE 3+ VIEW COMPARISON:  None Available. FINDINGS: There is no evidence of acute fracture or dislocation. Slight elevation of the anterior fat pad is equivocal for joint effusion. Joint spaces are maintained. Small posterior olecranon enthesophyte. Mild soft tissue swelling. IMPRESSION: 1. No evidence of acute fracture or dislocation. 2. Slight elevation of the anterior fat pad is equivocal for  joint effusion. Electronically Signed   By: Duanne Guess D.O.   On: 04/09/2023 16:37   DG Chest Portable 1 View  Result Date: 04/09/2023 CLINICAL DATA:  Pain after fall EXAM: PORTABLE CHEST 1 VIEW COMPARISON:  X-ray 01/13/2023 and older FINDINGS: Status post median sternotomy. Enlarged cardiopericardial silhouette. Left chest pacemaker/defibrillator with leads along the right side of the heart. No consolidation, pneumothorax or effusion. No edema. Dense left midlung nodule. Stable from older exams. IMPRESSION: Postop chest.  Defibrillator. Enlarged heart. Stable dense left midlung nodule Electronically Signed   By: Karen Kays M.D.   On: 04/09/2023 16:31    Principal Problem:   Altered mental state Active Problems:   Acute on chronic diastolic CHF (congestive heart failure)   Essential hypertension, benign   PAF (paroxysmal atrial fibrillation)   Chronic kidney disease, stage 3a   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 (HCC)   History of renal cell cancer   Vitamin B12 deficiency   Major depressive disorder, recurrent, in remission (HCC)   GERD (gastroesophageal reflux disease)   BPH (benign prostatic hyperplasia)   AICD (automatic cardioverter/defibrillator) present     Pieter Partridge, [facility]  If 7PM-7AM, please contact night-coverage [email or web address]  LOS: 0 days

## 2023-04-10 NOTE — Progress Notes (Signed)
Physical Therapy Evaluation Patient Details Name: Alexander Austin MRN: [id number] DOB: November 11, 1946 Today's Date: 04/10/2023  History of Present Illness  Pt is a 77 y.o. M who presents 04/09/2023 as a level 2 trauma for fall on blood thinners and has been unable to walk. Pt reports  placed on gabapentin by neurologist outside of the [place] system approximately 3 weeks ago. Significant PMH: paroxysmal A-fib on Eliquis, HTN, CVA on Plavix, CHF, CAD status post CABG, depression, with some ongoing neuropathy  Clinical Impression  PTA, pt lives alone, uses a Rollator and has an aide 5 days/wk to assist with ADL's/IADL's. Pt reports two falls and increasing BLE weakness since being placed on Gabapentin. Pt presents with generalized weakness, low back pain and decreased activity tolerance. Pt requiring minimal assist for rolling, but with attempted transition to sitting, experienced sharp pain in low back and requesting to lie back down. Anticipate pt may need short SNF stay. Will progress as tolerated.     Recommendations for follow up therapy are one component of a multi-disciplinary discharge planning process, led by the attending physician.  Recommendations may be updated based on patient status, additional functional criteria and insurance authorization.  Follow Up Recommendations Can patient physically be transported by private vehicle: No     Assistance Recommended at Discharge Frequent or constant Supervision/Assistance  Patient can return home with the following  Two people to help with walking and/or transfers;Two people to help with bathing/dressing/bathroom    Equipment Recommendations Other (comment) (TBA)  Recommendations for Other Services       Functional Status Assessment Patient has had a recent decline in their functional status and demonstrates the ability to make significant improvements in function in a reasonable and predictable amount of time.     Precautions / Restrictions  Precautions Precautions: Fall Restrictions Weight Bearing Restrictions: No      Mobility  Bed Mobility Overal bed mobility: Needs Assistance Bed Mobility: Rolling Rolling: Min assist         General bed mobility comments: Pt able to roll towards right with use of bed rail and minA for guiding hips into sidelying. With attempts to elevate trunk to sitting, pt with sharp pain in lower back and requesting to lie back down    Transfers                   General transfer comment: deferred by pt due to pain    Ambulation/Gait                  Stairs            Wheelchair Mobility    Modified Rankin (Stroke Patients Only)       Balance                                             Pertinent Vitals/Pain Pain Assessment Pain Assessment: Faces Faces Pain Scale: Hurts whole lot Pain Location: low back Pain Descriptors / Indicators: Discomfort, Grimacing, Guarding, Sharp Pain Intervention(s): Limited activity within patient's tolerance, Monitored during session, Repositioned    Home Living Family/patient expects to be discharged to:: Unsure (rehab) Living Arrangements: Alone Available Help at Discharge: Neighbor;Personal care attendant;Available PRN/intermittently Type of Home: House Home Access: Level entry     Alternate Level Stairs-Number of Steps: flight -has a stair lift. says he only goes upstairs if landlord  makes dinner (lives above him) Home Layout: Two level;Able to live on main level with bedroom/bathroom;Other (Comment) Home Equipment: Rollator (4 wheels);Shower seat - built in;Cane - Nurse, learning disability      Prior Function Prior Level of Function : Needs assist             Mobility Comments: Ambulating with Rollator, endorses 2 falls ADLs Comments: Has aide 5x/wk who assists with housework, meals, bathing, other iADLs. Aide assists with bathing, has RN that comes in to assist with med mgmt every 2 weeks.  Landlord assist with transportation and financial mgmt     Hand Dominance   Dominant Hand: Right    Extremity/Trunk Assessment   Upper Extremity Assessment Upper Extremity Assessment: Defer to OT evaluation    Lower Extremity Assessment Lower Extremity Assessment: Generalized weakness       Communication   Communication: HOH  Cognition Arousal/Alertness: Awake/alert Behavior During Therapy: WFL for tasks assessed/performed Overall Cognitive Status: Within Functional Limits for tasks assessed                                          General Comments General comments (skin integrity, edema, etc.): Bruising along buttocks    Exercises     Assessment/Plan    PT Assessment Patient needs continued PT services  PT Problem List Decreased strength;Decreased activity tolerance;Decreased mobility;Pain       PT Treatment Interventions DME instruction;Gait training;Functional mobility training;Therapeutic activities;Therapeutic exercise;Balance training;Patient/family education    PT Goals (Current goals can be found in the Care Plan section)  Acute Rehab PT Goals Patient Stated Goal: less pain PT Goal Formulation: With patient Time For Goal Achievement: 04/24/23 Potential to Achieve Goals: Fair    Frequency Min 2X/week     Co-evaluation               AM-PAC PT "6 Clicks" Mobility  Outcome Measure Help needed turning from your back to your side while in a flat bed without using bedrails?: A Little Help needed moving from lying on your back to sitting on the side of a flat bed without using bedrails?: Total Help needed moving to and from a bed to a chair (including a wheelchair)?: Total Help needed standing up from a chair using your arms (e.g., wheelchair or bedside chair)?: Total Help needed to walk in hospital room?: Total Help needed climbing 3-5 steps with a railing? : Total 6 Click Score: 8    End of Session   Activity Tolerance:  Patient limited by pain Patient left: in bed;with call bell/phone within reach Nurse Communication: Mobility status PT Visit Diagnosis: Muscle weakness (generalized) (M62.81);History of falling (Z91.81);Pain Pain - part of body:  (back)    Time: 1202-1219 PT Time Calculation (min) (ACUTE ONLY): 17 min   Charges:   PT Evaluation $PT Eval Moderate Complexity: 1 Mod          Lillia Pauls, PT, DPT Acute Rehabilitation Services Office [phone]   Norval Morton 04/10/2023, 1:52 PM

## 2023-04-10 NOTE — ED Notes (Signed)
Attempted to call floor at this time to see if pt can come up. No answer on the phone.

## 2023-04-10 NOTE — ED Notes (Addendum)
Pt states that he was given a lasix pill around midnight and urinated on himself around 3 AM. Pt was not changed during night shift. Blood soaked bandage noted to right elbow. Pt's bed linens changed at this time. Male purewick placed on pt after shaving pt's groin hair and cleaning the groin area. While cleaning the pt up, this RN noted bruising to bilateral buttocks and mild bruising to left lower back. No significant wounds noted. Blood soaked bandage removed and replaced with clean bandage. Pt currently awake and oriented. Call bell within reach. Pt has no complaints or concerns at this time.

## 2023-04-10 NOTE — ED Notes (Signed)
ED TO INPATIENT HANDOFF REPORT  ED Nurse Name and Phone #: Rhae Lerner [phone]  S Name/Age/Gender Alexander Austin 77 y.o. male Room/Bed: 036C/036C  Code Status   Code Status: Full Code  Home/SNF/Other Home Patient oriented to: self, place, time, and situation Is this baseline? Yes   Triage Complete: Triage complete  Chief Complaint Altered mental state [R41.82]  Triage Note Pt BIB EMS from home for a fall, pt lives downstairs of home. Family states they heard a noise and went to check on pt and was found in the floor in the bathroom. Unknown LOC, pt does not remember the fall. Unknown if hit head on Plavix. C/o back pain and R elbow pain with skin tear to R elbow. Family reports confusion at baseline but states he is more altered than normal and asking repetitive questions.  EMS VS:  134/74 HR 40's-90's with demand pacer CBG 99   Allergies Allergies  Allergen Reactions   Isosorbide Nitrate Anaphylaxis    Other reaction(s): Cardiovascular Arrest (ALLERGY/intolerance) Can take Sublingual Nitro.    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    Level of Care/Admitting Diagnosis ED Disposition     ED Disposition  Admit   Condition  --   Comment  Hospital Area: [facility] [[id number]]  Level of Care: Med-Surg [16]  May place patient in observation at [facility] or [facility] if equivalent level of care is available:: Yes  Covid Evaluation: Asymptomatic - no recent exposure (last 10 days) testing not required  Diagnosis: Altered mental state [454098]  Admitting Physician: Samara Snide  Attending Physician: Samara Snide          B Medical/Surgery History Past Medical History:  Diagnosis Date   A-fib    Anxiety    Benign prostate hyperplasia    Cancer    Kidney   Chronic kidney disease    Congestive heart failure (CHF)    CVA (cerebral infarction)    Depression    Dyspnea     GERD (gastroesophageal reflux disease)    Gout    Hematospermia 01/13/2012   History of myocardial infarction 1993   Hyperlipidemia    Hypertension    Sleep apnea    Stroke    Vitamin B 12 deficiency    Weakness of left leg 03/28/2020   Past Surgical History:  Procedure Laterality Date   ANGIOPLASTY     X 8    BACK SURGERY     CHOLECYSTECTOMY     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   ENDARTERECTOMY Left 09/20/2022   Procedure: ENDARTERECTOMY CAROTID WITH 1 CM X 6CM BIOLOGIC PATCH.;  Surgeon: Chuck Hint, MD;  Location: [facility] OR;  Service: Vascular;  Laterality: Left;   SAPHENOUS VEIN GRAFT RESECTION  02/05/15     A IV Location/Drains/Wounds Patient Lines/Drains/Airways Status     Active Line/Drains/Airways     Name Placement date Placement time Site Days   Peripheral IV 04/09/23 20 G Left Antecubital 04/09/23  1523  Antecubital  1   External Urinary Catheter 11/10/22  1640  --  151   Wound / Incision (Open or Dehisced) 09/14/22 Laceration;Puncture Head Posterior Per patient presumed skin cancer 09/14/22  1818  Head  208            Intake/Output Last 24 hours  Intake/Output Summary (Last 24 hours) at 04/10/2023 1208 Last data filed at 04/10/2023 1150 Gross per 24  hour  Intake 100 ml  Output --  Net 100 ml    Labs/Imaging Results for orders placed or performed during the hospital encounter of 04/09/23 (from the past 48 hour(s))  CBC with Differential     Status: Abnormal   Collection Time: 04/09/23  3:41 PM  Result Value Ref Range   WBC 3.9 (L) 4.0 - 10.5 K/uL   RBC 4.23 4.22 - 5.81 MIL/uL   Hemoglobin 10.8 (L) 13.0 - 17.0 g/dL   HCT 82.9 (L) 56.2 - 13.0 %   MCV 81.6 80.0 - 100.0 fL   MCH 25.5 (L) 26.0 - 34.0 pg   MCHC 31.3 30.0 - 36.0 g/dL   RDW 86.5 (H) 78.4 - 69.6 %   Platelets 205 150 - 400 K/uL   nRBC 0.0 0.0 - 0.2 %   Neutrophils Relative % 68 %   Neutro Abs 2.7 1.7 - 7.7 K/uL   Lymphocytes Relative 21 %   Lymphs Abs 0.8 0.7 - 4.0 K/uL    Monocytes Relative 8 %   Monocytes Absolute 0.3 0.1 - 1.0 K/uL   Eosinophils Relative 1 %   Eosinophils Absolute 0.1 0.0 - 0.5 K/uL   Basophils Relative 1 %   Basophils Absolute 0.0 0.0 - 0.1 K/uL   Immature Granulocytes 1 %   Abs Immature Granulocytes 0.02 0.00 - 0.07 K/uL    Comment: Performed at [facility], [address] [place], [place] [address]  Comprehensive metabolic panel     Status: Abnormal   Collection Time: 04/09/23  3:41 PM  Result Value Ref Range   Sodium 142 135 - 145 mmol/L   Potassium 3.4 (L) 3.5 - 5.1 mmol/L   Chloride 105 98 - 111 mmol/L   CO2 25 22 - 32 mmol/L   Glucose, Bld 92 70 - 99 mg/dL    Comment: Glucose reference range applies only to samples taken after fasting for at least 8 hours.   BUN 20 8 - 23 mg/dL   Creatinine, Ser 4.13 (H) 0.61 - 1.24 mg/dL   Calcium 9.4 8.9 - 24.4 mg/dL   Total Protein 6.7 6.5 - 8.1 g/dL   Albumin 3.7 3.5 - 5.0 g/dL   AST 13 (L) 15 - 41 U/L   ALT 10 0 - 44 U/L   Alkaline Phosphatase 79 38 - 126 U/L   Total Bilirubin 1.0 0.3 - 1.2 mg/dL   GFR, Estimated 30 (L) >60 mL/min    Comment: (NOTE) Calculated using the CKD-EPI Creatinine Equation (2021)    Anion gap 12 5 - 15    Comment: Performed at [facility], [address], [place], [place] [address]  CK     Status: Abnormal   Collection Time: 04/09/23  3:41 PM  Result Value Ref Range   Total CK 23 (L) 49 - 397 U/L    Comment: Performed at [facility], [address], [place], [place] [address]  I-stat chem 8, ED (not at [facility], [facility] or [facility])     Status: Abnormal   Collection Time: 04/09/23  3:51 PM  Result Value Ref Range   Sodium 144 135 - 145 mmol/L   Potassium 3.5 3.5 - 5.1 mmol/L   Chloride 106 98 - 111 mmol/L   BUN 24 (H) 8 - 23 mg/dL   Creatinine, Ser 4.40 (H) 0.61 - 1.24 mg/dL   Glucose, Bld 92 70 - 99 mg/dL    Comment: Glucose reference range applies only to samples  taken after fasting for at least 8 hours.   Calcium, Ion 1.18 1.15 -  1.40 mmol/L   TCO2 27 22 - 32 mmol/L   Hemoglobin 11.2 (L) 13.0 - 17.0 g/dL   HCT 19.1 (L) 47.8 - 29.5 %  Urinalysis, Routine w reflex microscopic -Urine, Clean Catch     Status: Abnormal   Collection Time: 04/09/23  8:32 PM  Result Value Ref Range   Color, Urine STRAW (A) YELLOW   APPearance CLEAR CLEAR   Specific Gravity, Urine 1.006 1.005 - 1.030   pH 5.0 5.0 - 8.0   Glucose, UA NEGATIVE NEGATIVE mg/dL   Hgb urine dipstick NEGATIVE NEGATIVE   Bilirubin Urine NEGATIVE NEGATIVE   Ketones, ur NEGATIVE NEGATIVE mg/dL   Protein, ur NEGATIVE NEGATIVE mg/dL   Nitrite NEGATIVE NEGATIVE   Leukocytes,Ua NEGATIVE NEGATIVE    Comment: Performed at [facility], [address] [place], [place] [address]  TSH     Status: None   Collection Time: 04/10/23  2:26 AM  Result Value Ref Range   TSH 2.012 0.350 - 4.500 uIU/mL    Comment: Performed by a 3rd Generation assay with a functional sensitivity of <=0.01 uIU/mL. Performed at [facility], [address] [place], [place] [address]   Basic metabolic panel     Status: Abnormal   Collection Time: 04/10/23  2:26 AM  Result Value Ref Range   Sodium 143 135 - 145 mmol/L   Potassium 2.8 (L) 3.5 - 5.1 mmol/L   Chloride 106 98 - 111 mmol/L   CO2 26 22 - 32 mmol/L   Glucose, Bld 86 70 - 99 mg/dL    Comment: Glucose reference range applies only to samples taken after fasting for at least 8 hours.   BUN 19 8 - 23 mg/dL   Creatinine, Ser 4.69 (H) 0.61 - 1.24 mg/dL   Calcium 9.0 8.9 - 62.9 mg/dL   GFR, Estimated 33 (L) >60 mL/min    Comment: (NOTE) Calculated using the CKD-EPI Creatinine Equation (2021)    Anion gap 11 5 - 15    Comment: Performed at [facility], [address] [place], [place] [address]  CBC     Status: Abnormal   Collection Time: 04/10/23  2:26 AM  Result Value Ref Range   WBC 3.8 (L) 4.0 - 10.5 K/uL   RBC 3.69 (L) 4.22 - 5.81 MIL/uL   Hemoglobin 9.4 (L) 13.0 - 17.0 g/dL   HCT 32.4 (L) 40.1 - 02.7 %    MCV 80.5 80.0 - 100.0 fL   MCH 25.5 (L) 26.0 - 34.0 pg   MCHC 31.6 30.0 - 36.0 g/dL   RDW 25.3 (H) 66.4 - 40.3 %   Platelets 163 150 - 400 K/uL   nRBC 0.0 0.0 - 0.2 %    Comment: Performed at [facility], [address] [place], [place] [address]  Vitamin B12     Status: None   Collection Time: 04/10/23  2:26 AM  Result Value Ref Range   Vitamin B-12 265 180 - 914 pg/mL    Comment: (NOTE) This assay is not validated for testing neonatal or myeloproliferative syndrome specimens for Vitamin B12 levels. Performed at [facility], [address] [place], [place] [address]   Basic metabolic panel     Status: Abnormal   Collection Time: 04/10/23 10:54 AM  Result Value Ref Range   Sodium 142 135 - 145 mmol/L  Potassium 3.0 (L) 3.5 - 5.1 mmol/L   Chloride 106 98 - 111 mmol/L   CO2 25 22 - 32 mmol/L   Glucose, Bld 95 70 - 99 mg/dL    Comment: Glucose reference range applies only to samples taken after fasting for at least 8 hours.   BUN 19 8 - 23 mg/dL   Creatinine, Ser 1.61 (H) 0.61 - 1.24 mg/dL   Calcium 9.0 8.9 - 09.6 mg/dL   GFR, Estimated 34 (L) >60 mL/min    Comment: (NOTE) Calculated using the CKD-EPI Creatinine Equation (2021)    Anion gap 11 5 - 15    Comment: Performed at [facility], [address], [place], [place] [address]  CBC     Status: Abnormal   Collection Time: 04/10/23 10:54 AM  Result Value Ref Range   WBC 3.8 (L) 4.0 - 10.5 K/uL   RBC 3.73 (L) 4.22 - 5.81 MIL/uL   Hemoglobin 9.4 (L) 13.0 - 17.0 g/dL   HCT 98.1 (L) 19.1 - 47.8 %   MCV 81.5 80.0 - 100.0 fL   MCH 25.2 (L) 26.0 - 34.0 pg   MCHC 30.9 30.0 - 36.0 g/dL   RDW 29.5 (H) 62.1 - 30.8 %   Platelets 174 150 - 400 K/uL   nRBC 0.0 0.0 - 0.2 %    Comment: Performed at [facility], [address] [place], [place] [address]   CT CHEST ABDOMEN PELVIS WO CONTRAST  Result Date: 04/09/2023 CLINICAL DATA:  Larey Seat.  Polytrauma. EXAM: CT CHEST, ABDOMEN AND PELVIS WITHOUT CONTRAST  TECHNIQUE: Multidetector CT imaging of the chest, abdomen and pelvis was performed following the standard protocol without IV contrast. RADIATION DOSE REDUCTION: This exam was performed according to the departmental dose-optimization program which includes automated exposure control, adjustment of the mA and/or kV according to patient size and/or use of iterative reconstruction technique. COMPARISON:  01/30/2020 FINDINGS: CT CHEST FINDINGS Cardiovascular: The heart is normal in size. No pericardial effusion. There is tortuosity and calcification of the thoracic aorta but no aneurysm. Three-vessel coronary artery calcifications are noted along with prior surgical changes from coronary artery bypass surgery. Mediastinum/Nodes: No mediastinal or hilar mass or lymphadenopathy. Scattered calcified lymph nodes are noted. The esophagus is unremarkable. Lungs/Pleura: Stable large calcified granuloma in the left upper lobe along with scattered small noncalcified granulomas. These are unchanged since 2019 and considered benign. No new pulmonary lesions or pulmonary nodules. Chronic pulmonary scarring changes. No pleural effusions or pleural lesions. Musculoskeletal: No chest wall mass, supraclavicular or axillary adenopathy. CT ABDOMEN PELVIS FINDINGS Hepatobiliary: No evidence of acute hepatic injury and no perihepatic hematoma. No hepatic lesions or intrahepatic biliary dilatation. No common bile duct dilatation. Pancreas: No acute pancreatic injury or peripancreatic fluid collection. No mass or inflammation. Spleen: Intact. No acute splenic injury or perisplenic hematoma. Small scattered calcified granulomas are noted. Adrenals/Urinary Tract: Stable 18 mm left adrenal gland nodule measuring 9 Hounsfield units and consistent with a benign adenoma. No further imaging evaluation or follow-up is necessary. The right adrenal gland is normal. Surgical changes from a prior partial right nephrectomy. No findings for recurrent  tumor. No renal or obstructing ureteral calculi. The bladder is unremarkable. Stomach/Bowel: The stomach, duodenum, small bowel and colon are unremarkable. No acute inflammatory process, mass lesions or obstructive findings. The terminal ileum and appendix are normal. Vascular/Lymphatic: Stable atherosclerotic calcifications involving the aorta and branch vessels but no aneurysm. No mesenteric or retroperitoneal mass, adenopathy or hematoma. Reproductive: The  prostate gland is mildly enlarged. The seminal vesicles are unremarkable. Other: No free air or free fluid. Musculoskeletal: The bony structures are intact. No hip or pelvic fractures. The pubic symphysis and SI joints are maintained. Degenerative changes involving the spine and remote postsurgical changes but no acute spine fracture. IMPRESSION: 1. No acute injury is identified involving the chest, abdomen or pelvis. 2. Stable surgical changes from a prior partial right nephrectomy. No findings for recurrent tumor. 3. Stable left adrenal gland adenoma. 4. Stable pulmonary scarring changes and benign pulmonary nodules. 5. Stable atherosclerotic calcifications involving the aorta and branch vessels including the coronary arteries. Aortic Atherosclerosis (ICD10-I70.0). Electronically Signed   By: Rudie Meyer M.D.   On: 04/09/2023 16:59   CT Head Wo Contrast  Result Date: 04/09/2023 CLINICAL DATA:  Larey Seat. EXAM: CT HEAD WITHOUT CONTRAST CT CERVICAL SPINE WITHOUT CONTRAST TECHNIQUE: Multidetector CT imaging of the head and cervical spine was performed following the standard protocol without intravenous contrast. Multiplanar CT image reconstructions of the cervical spine were also generated. RADIATION DOSE REDUCTION: This exam was performed according to the departmental dose-optimization program which includes automated exposure control, adjustment of the mA and/or kV according to patient size and/or use of iterative reconstruction technique. COMPARISON:  CT  scan 01/13/2023 FINDINGS: CT HEAD FINDINGS Brain: Stable age related cerebral atrophy, ventriculomegaly and periventricular white matter disease. No extra-axial fluid collections are identified. No CT findings for acute hemispheric infarction or intracranial hemorrhage. No mass lesions. The brainstem and cerebellum are normal. Vascular: Stable advanced atherosclerotic calcifications involving the major vascular structures but no aneurysm hyperdense vessels. Skull: No acute skull fracture bone lesions. Sinuses/Orbits: The paranasal sinuses and mastoid air cells are clear. The globes are intact. Other: No scalp lesions or scalp hematoma. CT CERVICAL SPINE FINDINGS Alignment: Normal Skull base and vertebrae: No acute fracture. No primary bone lesion or focal pathologic process. Soft tissues and spinal canal: No prevertebral fluid or swelling. No visible canal hematoma. Disc levels: The spinal canal is fairly generous. No significant canal stenosis. 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 No significant bony foraminal stenosis. 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 Upper chest: The lung apices are grossly clear. Other: No neck mass, adenopathy or hematoma. IMPRESSION: 1. Stable age related cerebral atrophy, ventriculomegaly and periventricular white matter disease. 2. No acute intracranial findings or skull fracture. 3. Normal alignment of the cervical spine without acute fracture. 4. 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 5. 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 Electronically Signed   By: Rudie Meyer M.D.   On: 04/09/2023 16:47   CT Cervical Spine Wo Contrast  Result Date: 04/09/2023 CLINICAL DATA:  Larey Seat. EXAM: CT HEAD WITHOUT CONTRAST CT CERVICAL SPINE WITHOUT CONTRAST TECHNIQUE:  Multidetector CT imaging of the head and cervical spine was performed following the standard protocol without intravenous contrast. Multiplanar CT image reconstructions of the cervical spine were also generated. RADIATION DOSE REDUCTION: This exam was performed according to the departmental dose-optimization program which includes automated exposure control, adjustment of the mA and/or kV according to patient size and/or use of iterative reconstruction technique. COMPARISON:  CT scan 01/13/2023 FINDINGS: CT HEAD FINDINGS Brain: Stable age related cerebral atrophy, ventriculomegaly and periventricular white matter disease. No extra-axial fluid collections are identified. No CT findings for acute hemispheric infarction or intracranial hemorrhage. No  mass lesions. The brainstem and cerebellum are normal. Vascular: Stable advanced atherosclerotic calcifications involving the major vascular structures but no aneurysm hyperdense vessels. Skull: No acute skull fracture bone lesions. Sinuses/Orbits: The paranasal sinuses and mastoid air cells are clear. The globes are intact. Other: No scalp lesions or scalp hematoma. CT CERVICAL SPINE FINDINGS Alignment: Normal Skull base and vertebrae: No acute fracture. No primary bone lesion or focal pathologic process. Soft tissues and spinal canal: No prevertebral fluid or swelling. No visible canal hematoma. Disc levels: The spinal canal is fairly generous. No significant canal stenosis. 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 No significant bony foraminal stenosis. 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 Upper chest: The lung apices are grossly clear. Other: No neck mass, adenopathy or hematoma. IMPRESSION: 1. Stable age related cerebral atrophy, ventriculomegaly and periventricular white matter disease. 2. No acute intracranial findings or skull fracture. 3. Normal  alignment of the cervical spine without acute fracture. 4. 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 5. 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 Electronically Signed   By: Rudie Meyer M.D.   On: 04/09/2023 16:47   DG Hip Unilat W or Wo Pelvis 2-3 Views Left  Result Date: 04/09/2023 CLINICAL DATA:  Pain after fall EXAM: DG HIP (WITH OR WITHOUT PELVIS) 3V LEFT COMPARISON:  Abdomen pelvis CT 01/30/2020 FINDINGS: No fracture or dislocation. Mild degenerative changes seen of the hip joints. There is a well corticated density overlying the posterior acetabulum of the left hip. This is stable going back to a CT scan of 01/30/2020 and could be sequela of old trauma. Degenerative changes of the lumbar spine. Overlapping cardiac leads. IMPRESSION: Degenerative changes. Electronically Signed   By: Karen Kays M.D.   On: 04/09/2023 16:40   DG Elbow Complete Right  Result Date: 04/09/2023 CLINICAL DATA:  Fall EXAM: RIGHT ELBOW - COMPLETE 3+ VIEW COMPARISON:  None Available. FINDINGS: There is no evidence of acute fracture or dislocation. Slight elevation of the anterior fat pad is equivocal for joint effusion. Joint spaces are maintained. Small posterior olecranon enthesophyte. Mild soft tissue swelling. IMPRESSION: 1. No evidence of acute fracture or dislocation. 2. Slight elevation of the anterior fat pad is equivocal for joint effusion. Electronically Signed   By: Duanne Guess D.O.   On: 04/09/2023 16:37   DG Chest Portable 1 View  Result Date: 04/09/2023 CLINICAL DATA:  Pain after fall EXAM: PORTABLE CHEST 1 VIEW COMPARISON:  X-ray 01/13/2023 and older FINDINGS: Status post median sternotomy. Enlarged cardiopericardial silhouette. Left chest pacemaker/defibrillator with leads along the right side of the heart. No consolidation, pneumothorax or effusion. No edema. Dense left midlung nodule. Stable from  older exams. IMPRESSION: Postop chest.  Defibrillator. Enlarged heart. Stable dense left midlung nodule Electronically Signed   By: Karen Kays M.D.   On: 04/09/2023 16:31    Pending Labs Unresulted Labs (From admission, onward)    None       Vitals/Pain Today's Vitals   04/10/23 0940 04/10/23 1100 04/10/23 1111 04/10/23 1200  BP: (!) 164/62 (!) 145/56  (!) 146/59  Pulse: (!) 57 (!) 56  60  Resp: 20 17  (!) 21  Temp:   98.1 F (36.7 C)   TempSrc:   Oral   SpO2: 95% 95%  98%  Weight:  Height:      PainSc:        Isolation Precautions No active isolations  Medications Medications  allopurinol (ZYLOPRIM) tablet 100 mg (100 mg Oral Given 04/10/23 0939)  febuxostat (ULORIC) tablet 80 mg (80 mg Oral Given 04/10/23 1044)  amLODipine (NORVASC) tablet 10 mg (10 mg Oral Given 04/10/23 0939)  atorvastatin (LIPITOR) tablet 80 mg (80 mg Oral Patient Refused/Not Given 04/09/23 2320)  carvedilol (COREG) tablet 12.5 mg (12.5 mg Oral Given 04/10/23 0811)  furosemide (LASIX) tablet 40 mg (40 mg Oral Given 04/10/23 0811)  hydrALAZINE (APRESOLINE) tablet 50 mg (50 mg Oral Given 04/10/23 0939)  escitalopram (LEXAPRO) tablet 20 mg (20 mg Oral Patient Refused/Not Given 04/09/23 2321)  traZODone (DESYREL) tablet 25 mg (has no administration in time range)  calcitRIOL (ROCALTROL) capsule 0.25 mcg (0.25 mcg Oral Given 04/10/23 1044)  docusate sodium (COLACE) capsule 200 mg (200 mg Oral Patient Refused/Not Given 04/10/23 0939)  pantoprazole (PROTONIX) EC tablet 40 mg (40 mg Oral Given 04/10/23 0811)  clopidogrel (PLAVIX) tablet 75 mg (75 mg Oral Given 04/10/23 0939)  primidone (MYSOLINE) tablet 100 mg (100 mg Oral Patient Refused/Not Given 04/09/23 2321)  potassium chloride SA (KLOR-CON M) CR tablet 20 mEq (20 mEq Oral Given 04/10/23 0939)  Vitamin D (Ergocalciferol) (DRISDOL) 1.25 MG (50000 UNIT) capsule 50,000 Units (50,000 Units Oral Given 04/10/23 1044)  albuterol (PROVENTIL) (2.5 MG/3ML) 0.083%  nebulizer solution 2.5 mg (has no administration in time range)  apixaban (ELIQUIS) tablet 5 mg (5 mg Oral Given 04/10/23 0939)  sodium chloride flush (NS) 0.9 % injection 3 mL (3 mLs Intravenous Given 04/10/23 0940)  sodium chloride flush (NS) 0.9 % injection 3 mL (has no administration in time range)  0.9 %  sodium chloride infusion (has no administration in time range)  acetaminophen (TYLENOL) tablet 650 mg (has no administration in time range)    Or  acetaminophen (TYLENOL) suppository 650 mg (has no administration in time range)  fentaNYL (SUBLIMAZE) injection 12.5-50 mcg (has no administration in time range)  polyethylene glycol (MIRALAX / GLYCOLAX) packet 17 g (has no administration in time range)  metoprolol tartrate (LOPRESSOR) injection 5 mg (has no administration in time range)  potassium chloride 10 mEq in 100 mL IVPB (10 mEq Intravenous New Bag/Given 04/10/23 1150)  lactated ringers bolus 1,000 mL (0 mLs Intravenous Stopped 04/09/23 2119)    Mobility Pt has been bed bound due to weakness and multiple falls.      Focused Assessments Pt seen in ED as Level 2 trauma (fall on thinners). Pt is currently A/Ox4. Bruising noted bilateral buttocks and left low back. No other obvious injuries noted.    R Recommendations: See Admitting Provider Note  Report given to:   Additional Notes:

## 2023-04-10 NOTE — ED Notes (Signed)
PT at bedside.

## 2023-04-11 ENCOUNTER — Encounter (HOSPITAL_COMMUNITY): Payer: Self-pay | Admitting: Family Medicine

## 2023-04-11 DIAGNOSIS — R269 Unspecified abnormalities of gait and mobility: Secondary | ICD-10-CM | POA: Diagnosis present

## 2023-04-11 DIAGNOSIS — N1832 Chronic kidney disease, stage 3b: Secondary | ICD-10-CM | POA: Diagnosis present

## 2023-04-11 DIAGNOSIS — R404 Transient alteration of awareness: Secondary | ICD-10-CM | POA: Diagnosis not present

## 2023-04-11 DIAGNOSIS — F419 Anxiety disorder, unspecified: Secondary | ICD-10-CM | POA: Diagnosis present

## 2023-04-11 DIAGNOSIS — I130 Hypertensive heart and chronic kidney disease with heart failure and stage 1 through stage 4 chronic kidney disease, or unspecified chronic kidney disease: Secondary | ICD-10-CM | POA: Diagnosis present

## 2023-04-11 DIAGNOSIS — Y92002 Bathroom of unspecified non-institutional (private) residence single-family (private) house as the place of occurrence of the external cause: Secondary | ICD-10-CM | POA: Diagnosis not present

## 2023-04-11 DIAGNOSIS — R296 Repeated falls: Secondary | ICD-10-CM | POA: Diagnosis present

## 2023-04-11 DIAGNOSIS — M109 Gout, unspecified: Secondary | ICD-10-CM | POA: Diagnosis present

## 2023-04-11 DIAGNOSIS — N400 Enlarged prostate without lower urinary tract symptoms: Secondary | ICD-10-CM | POA: Diagnosis present

## 2023-04-11 DIAGNOSIS — R001 Bradycardia, unspecified: Secondary | ICD-10-CM | POA: Diagnosis present

## 2023-04-11 DIAGNOSIS — G629 Polyneuropathy, unspecified: Secondary | ICD-10-CM | POA: Diagnosis present

## 2023-04-11 DIAGNOSIS — I10 Essential (primary) hypertension: Secondary | ICD-10-CM | POA: Diagnosis not present

## 2023-04-11 DIAGNOSIS — W1830XA Fall on same level, unspecified, initial encounter: Secondary | ICD-10-CM | POA: Diagnosis present

## 2023-04-11 DIAGNOSIS — M545 Low back pain, unspecified: Secondary | ICD-10-CM | POA: Diagnosis present

## 2023-04-11 DIAGNOSIS — E785 Hyperlipidemia, unspecified: Secondary | ICD-10-CM | POA: Diagnosis present

## 2023-04-11 DIAGNOSIS — I25708 Atherosclerosis of coronary artery bypass graft(s), unspecified, with other forms of angina pectoris: Secondary | ICD-10-CM | POA: Diagnosis present

## 2023-04-11 DIAGNOSIS — E669 Obesity, unspecified: Secondary | ICD-10-CM | POA: Diagnosis present

## 2023-04-11 DIAGNOSIS — I5032 Chronic diastolic (congestive) heart failure: Secondary | ICD-10-CM | POA: Diagnosis present

## 2023-04-11 DIAGNOSIS — G8929 Other chronic pain: Secondary | ICD-10-CM | POA: Diagnosis present

## 2023-04-11 DIAGNOSIS — E876 Hypokalemia: Secondary | ICD-10-CM | POA: Diagnosis present

## 2023-04-11 DIAGNOSIS — Z905 Acquired absence of kidney: Secondary | ICD-10-CM | POA: Diagnosis not present

## 2023-04-11 DIAGNOSIS — D72819 Decreased white blood cell count, unspecified: Secondary | ICD-10-CM | POA: Diagnosis present

## 2023-04-11 DIAGNOSIS — R4182 Altered mental status, unspecified: Secondary | ICD-10-CM | POA: Diagnosis present

## 2023-04-11 DIAGNOSIS — M452 Ankylosing spondylitis of cervical region: Secondary | ICD-10-CM | POA: Diagnosis present

## 2023-04-11 DIAGNOSIS — W19XXXA Unspecified fall, initial encounter: Secondary | ICD-10-CM | POA: Diagnosis not present

## 2023-04-11 DIAGNOSIS — E538 Deficiency of other specified B group vitamins: Secondary | ICD-10-CM | POA: Diagnosis present

## 2023-04-11 DIAGNOSIS — K219 Gastro-esophageal reflux disease without esophagitis: Secondary | ICD-10-CM | POA: Diagnosis present

## 2023-04-11 DIAGNOSIS — I480 Paroxysmal atrial fibrillation: Secondary | ICD-10-CM | POA: Diagnosis present

## 2023-04-11 DIAGNOSIS — F3340 Major depressive disorder, recurrent, in remission, unspecified: Secondary | ICD-10-CM | POA: Diagnosis present

## 2023-04-11 LAB — BASIC METABOLIC PANEL
Anion gap: 9 (ref 5–15)
BUN: 24 mg/dL — ABNORMAL HIGH (ref 8–23)
CO2: 25 mmol/L (ref 22–32)
Calcium: 8.8 mg/dL — ABNORMAL LOW (ref 8.9–10.3)
Chloride: 107 mmol/L (ref 98–111)
Creatinine, Ser: 2.05 mg/dL — ABNORMAL HIGH (ref 0.61–1.24)
GFR, Estimated: 33 mL/min — ABNORMAL LOW (ref >60)
Glucose, Bld: 88 mg/dL (ref 70–99)
Potassium: 3.1 mmol/L — ABNORMAL LOW (ref 3.5–5.1)
Sodium: 141 mmol/L (ref 135–145)

## 2023-04-11 MED ORDER — POTASSIUM CHLORIDE CRYS ER 20 MEQ PO TBCR
40.0000 meq | EXTENDED_RELEASE_TABLET | ORAL | Status: AC
  Administered 2023-04-11 (×2): 40 meq via ORAL
  Filled 2023-04-11 (×2): qty 2

## 2023-04-11 MED ORDER — OXYCODONE HCL 5 MG PO TABS
5.0000 mg | ORAL_TABLET | Freq: Four times a day (QID) | ORAL | Status: DC | PRN
  Administered 2023-04-11 – 2023-04-12 (×2): 5 mg via ORAL
  Filled 2023-04-11 (×2): qty 1

## 2023-04-11 NOTE — Progress Notes (Signed)
Physical Therapy Treatment Patient Details Name: Alexander Austin MRN: [id number] DOB: 10/02/1946 Today's Date: 04/11/2023   History of Present Illness Pt is a 77 y.o. M who presents 04/09/2023 as a level 2 trauma for fall on blood thinners and has been unable to walk. Pt reports  placed on gabapentin by neurologist outside of the [place] system approximately 3 weeks ago. Significant PMH: paroxysmal A-fib on Eliquis, HTN, CVA on Plavix, CHF, CAD status post CABG, depression, with some ongoing neuropathy    PT Comments    Patient much less painful today and able to mobilize OOB with +2 mod assist with RW. Patient slow to come to stand and with slow, shuffling steps x 5 ft to chair. Noted multiple bruises on torso from fall(s). Continue PT plan as documented.     Recommendations for follow up therapy are one component of a multi-disciplinary discharge planning process, led by the attending physician.  Recommendations may be updated based on patient status, additional functional criteria and insurance authorization.  Follow Up Recommendations  Can patient physically be transported by private vehicle: No    Assistance Recommended at Discharge Frequent or constant Supervision/Assistance  Patient can return home with the following Two people to help with walking and/or transfers;Two people to help with bathing/dressing/bathroom   Equipment Recommendations  Other (comment) (TBA)    Recommendations for Other Services       Precautions / Restrictions Precautions Precautions: Fall;Other (comment) Precaution Comments: incontinence B/B Restrictions Weight Bearing Restrictions: No     Mobility  Bed Mobility Overal bed mobility: Needs Assistance Bed Mobility: Rolling, Sidelying to Sit Rolling: Min assist Sidelying to sit: Min assist       General bed mobility comments: rolling rt and lt for pericare as had been incontinent of bowels prior to arrival. using rails for all mobility and required HOB  elevated for side to sit.    Transfers Overall transfer level: Needs assistance Equipment used: Rolling walker (2 wheels) Transfers: Sit to/from Stand Sit to Stand: Mod assist, +2 physical assistance           General transfer comment: pt tends to extend knees with hip flexion/trunk bent forward and then extend hips/trunk upward    Ambulation/Gait Ambulation/Gait assistance: Min guard Gait Distance (Feet): 5 Feet Assistive device: Rolling walker (2 wheels) Gait Pattern/deviations: Step-to pattern, Decreased stride length, Wide base of support   Gait velocity interpretation: <1.8 ft/sec, indicate of risk for recurrent falls   General Gait Details: Short, shufffling steps from bed to recliner   Stairs             Wheelchair Mobility    Modified Rankin (Stroke Patients Only)       Balance Overall balance assessment: Needs assistance Sitting-balance support: No upper extremity supported, Feet supported Sitting balance-Leahy Scale: Fair     Standing balance support: Bilateral upper extremity supported, Reliant on assistive device for balance Standing balance-Leahy Scale: Poor                              Cognition Arousal/Alertness: Awake/alert Behavior During Therapy: WFL for tasks assessed/performed Overall Cognitive Status: Within Functional Limits for tasks assessed                                          Exercises      General Comments  General comments (skin integrity, edema, etc.): Noted multiple bruises along torso      Pertinent Vitals/Pain Pain Assessment Pain Assessment: Faces Faces Pain Scale: Hurts a little bit Pain Location: low back Pain Descriptors / Indicators: Discomfort Pain Intervention(s): Limited activity within patient's tolerance, Monitored during session, Repositioned    Home Living Family/patient expects to be discharged to:: Unsure (rehab) Living Arrangements: Alone Available Help at  Discharge: Neighbor;Personal care attendant;Available PRN/intermittently Type of Home: House Home Access: Level entry     Alternate Level Stairs-Number of Steps: flight -has a stair lift. says he only goes upstairs if landlord makes dinner (lives above him) Home Layout: Two level;Able to live on main level with bedroom/bathroom;Other (Comment) Home Equipment: Rollator (4 wheels);Shower seat - built in;Cane - Research scientist (physical sciences) Comments: does not wear O2 at home    Prior Function            PT Goals (current goals can now be found in the care plan section) Acute Rehab PT Goals Patient Stated Goal: less pain Time For Goal Achievement: 04/24/23 Potential to Achieve Goals: Fair Progress towards PT goals: Progressing toward goals    Frequency    Min 2X/week      PT Plan Current plan remains appropriate    Co-evaluation              AM-PAC PT "6 Clicks" Mobility   Outcome Measure  Help needed turning from your back to your side while in a flat bed without using bedrails?: A Little Help needed moving from lying on your back to sitting on the side of a flat bed without using bedrails?: A Lot Help needed moving to and from a bed to a chair (including a wheelchair)?: Total Help needed standing up from a chair using your arms (e.g., wheelchair or bedside chair)?: Total Help needed to walk in hospital room?: Total Help needed climbing 3-5 steps with a railing? : Total 6 Click Score: 9    End of Session Equipment Utilized During Treatment: Gait belt Activity Tolerance: Patient tolerated treatment well Patient left: with call bell/phone within reach;in chair;with chair alarm set Nurse Communication: Mobility status;Other (comment) (+2 mod and described his sequencing with sit to stand) PT Visit Diagnosis: Muscle weakness (generalized) (M62.81);History of falling (Z91.81);Pain Pain - part of body:  (back)     Time: 1610-9604 PT Time Calculation (min) (ACUTE  ONLY): 27 min  Charges:  $Gait Training: 8-22 mins $Therapeutic Activity: 8-22 mins                      Alexander Austin, PT Acute Rehabilitation Services  Office [phone]    Alexander Austin 04/11/2023, 12:30 PM

## 2023-04-11 NOTE — Progress Notes (Signed)
PROGRESS NOTE    Alexander Austin  XBJ:[id number]  DOB: 1946/09/29  DOA: 04/09/2023 PCP: Jeanine Luz Outpatient Specialists:   Hospital course:  77 year old with PAF on Eliquis, CVA on Plavix, CAD s/p CABG, CHF was admitted yesterday after multiple falls after [facility].  Trauma workup for falls on anticoagulation was negative except for swelling in elbow.  Subjective:  Patient states he finds standing Tylenol does very helpful.  Notes that his pain is much improved.  "With the Tylenol I was able to sit in a chair for 2 hours today, thank you for starting it".  His only real pain is lower back pain intermittently.  Notes he has taken narcotics in the past and as far as he knows has not had any adverse reactions to them.   Objective: Vitals:   04/10/23 2100 04/11/23 0512 04/11/23 0840 04/11/23 1654  BP: (!) 157/85 (!) 152/63 (!) 139/52 (!) 142/60  Pulse:  69 61 (!) 59  Resp:  Temp:  98.4 F (36.9 C)  98.2 F (36.8 C)  TempSrc:  Oral  Oral  SpO2:  98% 97% 99%  Weight:      Height:        Intake/Output Summary (Last 24 hours) at 04/11/2023 1846 Last data filed at 04/11/2023 1700 Gross per 24 hour  Intake 440 ml  Output 2450 ml  Net -2010 ml    Filed Weights   04/09/23 1520  Weight: 108.4 kg     Exam:  General: Pleasant gentleman in good spirits lying in bed in NAD. Eyes: sclera anicteric, conjuctiva mild injection bilaterally CVS: S1-S2, regular  Respiratory:  decreased air entry bilaterally secondary to decreased inspiratory effort, rales at bases  GI: NABS, obese, nondistended, firm but not hard, no tenderness to light or deep palpation. LE: Multiple ecchymoses, 1+ edema bilaterally.   Data Reviewed:  Basic Metabolic Panel: Recent Labs  Lab 04/09/23 1541 04/09/23 1551 04/10/23 0226 04/10/23 1054 04/10/23 1758 04/11/23 0224  NA 142 144 143 142  --  141  K 3.4* 3.5 2.8* 3.0* 3.5 3.1*   CL 105 106 106 106  --  107  CO2 25  --  26 25  --  25  GLUCOSE 92 92 86 95  --  88  BUN 20 24* 19 19  --  24*  CREATININE 2.18* 2.30* 2.02* 1.97*  --  2.05*  CALCIUM 9.4  --  9.0 9.0  --  8.8*  MG  --   --   --   --  1.9  --      CBC: Recent Labs  Lab 04/09/23 1541 04/09/23 1551 04/10/23 0226 04/10/23 1054  WBC 3.9*  --  3.8* 3.8*  NEUTROABS 2.7  --   --   --   HGB 10.8* 11.2* 9.4* 9.4*  HCT 34.5* 33.0* 29.7* 30.4*  MCV 81.6  --  80.5 81.5  PLT 205  --  163 174      Scheduled Meds:  acetaminophen  650 mg Oral Q6H   allopurinol  100 mg Oral Daily   amLODipine  10 mg Oral Daily   apixaban  5 mg Oral BID   atorvastatin  80 mg Oral QHS   calcitRIOL  0.25 mcg Oral Daily   carvedilol  12.5 ANES RIGELID WC   clopidogrel  75 mg Oral Daily   docusate sodium  200 mg Oral Daily  escitalopram  20 mg Oral QHS   febuxostat  80 mg Oral Daily   furosemide  40 mg Oral BID   hydrALAZINE  50 mg Oral TID   pantoprazole  40 mg Oral QAC breakfast   potassium chloride SA  20 mEq Oral Daily   primidone  100 mg Oral QHS   sodium chloride flush  3 mL Intravenous Q12H   Vitamin D (Ergocalciferol)  50,000 Units Oral Q7 days   Continuous Infusions:  sodium chloride       Assessment & Plan:   Gait abnormality Falls Chronic pain/chronic LBP Ankylosing spondylitis Very much appreciate PT evaluation and ongoing treatment Pain is significantly improved with standing Tylenol, would continue at discharge No NSAIDs given CKD and concomitant Eliquis dosing Will add oxycodone 5 mg as needed for lower back pain Patient with ankylosing spondylitis seen on cervical spine Gabapentin appears to have caused increased falls agree with discontinuing PT recommending SNF/rehab for discharge  Hypokalemia Continue aggressive repletion, potassium remains low  CKD 3 A Creatinine close to baseline and improving Total CK was 23, no evidence of rhabdo Avoid nephrotoxic agents  PAF Rate  controlled on carvedilol Stroke prevention on Eliquis  HTN Under reasonable control on present meds Of note patient is continued on home doses of Lasix 40 mg p.o. twice daily--he is net -2 L today.  Would need to follow I's and O's closely.  Major depressive disorder Doing well on Lexapro and trazodone  CAD Continue Plavix, carvedilol and atorvastatin   DVT prophylaxis: Eliquis Code Status: Full Family Communication: None today     Studies: No results found.  Principal Problem:   Altered mental state Active Problems:   Acute on chronic diastolic CHF (congestive heart failure)   Essential hypertension, benign   PAF (paroxysmal atrial fibrillation)   Chronic kidney disease, stage 3a   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 (HCC)   History of renal cell cancer   Vitamin B12 deficiency   Major depressive disorder, recurrent, in remission (HCC)   GERD (gastroesophageal reflux disease)   BPH (benign prostatic hyperplasia)   AICD (automatic cardioverter/defibrillator) present     Pieter Partridge, [facility]  If 7PM-7AM, please contact night-coverage [email or web address]   LOS: 0 days

## 2023-04-11 NOTE — Progress Notes (Signed)
Breakfast tray ordered. Pt set on automatic trays 

## 2023-04-11 NOTE — Care Management Obs Status (Signed)
MEDICARE OBSERVATION STATUS NOTIFICATION   Patient Details  Name: Alexander Austin MRN: [id number] Date of Birth: 02/07/1946   Medicare Observation Status Notification Given:  Yes    Carley Hammed, LCSWA 04/11/2023, 10:22 AM

## 2023-04-11 NOTE — Evaluation (Signed)
Occupational Therapy Evaluation Patient Details Name: Alexander Austin MRN: [id number] DOB: 1946/11/26 Today's Date: 04/11/2023   History of Present Illness Pt is a 77 y.o. M who presents 04/09/2023 as a level 2 trauma for fall on blood thinners and has been unable to walk. Pt reports  placed on gabapentin by neurologist outside of the [place] system approximately 3 weeks ago. Significant PMH: paroxysmal A-fib on Eliquis, HTN, CVA on Plavix, CHF, CAD status post CABG, depression, with some ongoing neuropathy   Clinical Impression   Pt demos decline in function and safety with ADLs and ADL mobility with impaired strength, balance and endurance. PTA, pt lives alone,has an aide 5 days/wk to assist with ADL's/IADL's  and uses a rollator for mobility. Pt reports two falls and increasing weakness. Pt currently requires min guard A with UB ADLs seated,  max - total A for LB self care, max A sit - stand with RW, min guard A with SPTs and total A with toileting tasks.  Pt reports low back pain. Pt would benefit from acute OT services to maximize level of function and safety    Recommendations for follow up therapy are one component of a multi-disciplinary discharge planning process, led by the attending physician.  Recommendations may be updated based on patient status, additional functional criteria and insurance authorization.   Assistance Recommended at Discharge Frequent or constant Supervision/Assistance  Patient can return home with the following A lot of help with bathing/dressing/bathroom;A lot of help with walking and/or transfers;Direct supervision/assist for medications management;Assist for transportation;Help with stairs or ramp for entrance    Functional Status Assessment  Patient has had a recent decline in their functional status and demonstrates the ability to make significant improvements in function in a reasonable and predictable amount of time.  Equipment Recommendations  None recommended by OT     Recommendations for Other Services       Precautions / Restrictions Precautions Precautions: Fall;Other (comment) Precaution Comments: incontinence B/B Restrictions Weight Bearing Restrictions: No      Mobility Bed Mobility Overal bed mobility: Needs Assistance Bed Mobility: Sit to Sidelying, Rolling Rolling: Min assist       Sit to sidelying: Max assist General bed mobility comments: pt in recliner upon arrival, assisted pt back to bed, max A with LE mgt    Transfers Overall transfer level: Needs assistance Equipment used: Rolling walker (2 wheels) Transfers: Sit to/from Stand Sit to Stand: Max assist                  Balance Overall balance assessment: Needs assistance Sitting-balance support: No upper extremity supported, Feet supported Sitting balance-Leahy Scale: Fair     Standing balance support: Bilateral upper extremity supported, Reliant on assistive device for balance Standing balance-Leahy Scale: Poor                             ADL either performed or assessed with clinical judgement   ADL Overall ADL's : Needs assistance/impaired Eating/Feeding: Independent;Sitting   Grooming: Wash/dry hands;Wash/dry face;Modified independent;Sitting   Upper Body Bathing: Min guard;Sitting   Lower Body Bathing: Maximal assistance;Sitting/lateral leans   Upper Body Dressing : Min guard;Sitting   Lower Body Dressing: Total assistance   Toilet Transfer: Maximal assistance;Ambulation;Rolling walker (2 wheels);Stand-pivot Statistician Details (indicate cue type and reason): simulated chair to bed Toileting- Clothing Manipulation and Hygiene: Total assistance       Functional mobility during ADLs: Maximal assistance;Min guard (  max A sit - stand, min guard A with ambulation x 2 ft bed to chair)       Vision Ability to See in Adequate Light: 0 Adequate Patient Visual Report: No change from baseline       Perception     Praxis       Pertinent Vitals/Pain Pain Assessment Pain Assessment: 0-10 Pain Score: 3  Pain Location: low back Pain Descriptors / Indicators: Discomfort Pain Intervention(s): Monitored during session, Repositioned     Hand Dominance Right   Extremity/Trunk Assessment Upper Extremity Assessment Upper Extremity Assessment: Generalized weakness   Lower Extremity Assessment Lower Extremity Assessment: Defer to PT evaluation       Communication Communication Communication: HOH   Cognition Arousal/Alertness: Awake/alert Behavior During Therapy: WFL for tasks assessed/performed Overall Cognitive Status: Within Functional Limits for tasks assessed                                       General Comments  Noted multiple bruises along torso    Exercises     Shoulder Instructions      Home Living Family/patient expects to be discharged to:: Unsure (pt stated he wants to consider SNF vs HH with aides) Living Arrangements: Alone Available Help at Discharge: Neighbor;Personal care attendant;Available PRN/intermittently Type of Home: House Home Access: Level entry     Home Layout: Two level;Able to live on main level with bedroom/bathroom;Other (Comment) Alternate Level Stairs-Number of Steps: flight -has a stair lift. says he only goes upstairs if landlord makes dinner (lives above him)   Web designer Shower/Tub: Producer, television/film/video: Standard     Home Equipment: Rollator (4 wheels);Shower seat - built in;Cane - Software engineer Comments: does not wear O2 at home      Prior Functioning/Environment Prior Level of Function : Needs assist             Mobility Comments: Ambulating with Rollator, endorses 2 falls ADLs Comments: pt reports Ind with dressing, toileting, grooming and self feeding. Has aide 5x/wk who assists with housework, meals, bathing, other iADLs. Aide assists with bathing, has RN that comes in to assist with med mgmt  every 2 weeks. Landlord assist with transportation and financial mgt.        OT Problem List: Decreased strength;Decreased activity tolerance;Decreased knowledge of use of DME or AE;Impaired balance (sitting and/or standing);Pain;Decreased coordination      OT Treatment/Interventions: Self-care/ADL training;Balance training;Therapeutic exercise;DME and/or AE instruction;Therapeutic activities;Patient/family education    OT Goals(Current goals can be found in the care plan section) Acute Rehab OT Goals Patient Stated Goal: get better and gom home OT Goal Formulation: With patient Time For Goal Achievement: 04/25/23 Potential to Achieve Goals: Good ADL Goals Pt Will Perform Grooming: with set-up;with supervision;sitting Pt Will Perform Upper Body Bathing: with supervision;with set-up;sitting Pt Will Perform Lower Body Bathing: with mod assist;with min assist;sitting/lateral leans Pt Will Perform Upper Body Dressing: with supervision;with set-up;sitting Pt Will Transfer to Toilet: with mod assist;with min assist;ambulating;stand pivot transfer;bedside commode Pt Will Perform Toileting - Clothing Manipulation and hygiene: with max assist;with mod assist;sitting/lateral leans;sit to/from stand  OT Frequency: Min 2X/week    Co-evaluation              AM-PAC OT "6 Clicks" Daily Activity     Outcome Measure Help from another person eating meals?: None Help from another person taking care  of personal grooming?: A Little Help from another person toileting, which includes using toliet, bedpan, or urinal?: Total Help from another person bathing (including washing, rinsing, drying)?: A Lot Help from another person to put on and taking off regular upper body clothing?: A Little Help from another person to put on and taking off regular lower body clothing?: Total 6 Click Score: 14   End of Session Equipment Utilized During Treatment: Gait belt;Rolling walker (2 wheels)  Activity  Tolerance: Patient limited by fatigue Patient left: in bed;with call bell/phone within reach;with bed alarm set  OT Visit Diagnosis: Unsteadiness on feet (R26.81);Other abnormalities of gait and mobility (R26.89);Muscle weakness (generalized) (M62.81);Pain Pain - part of body:  (low back)                Time: 1331-1355 OT Time Calculation (min): 24 min Charges:  OT General Charges $OT Visit: 1 Visit OT Evaluation $OT Eval Moderate Complexity: 1 Mod OT Treatments $Therapeutic Activity: 8-22 mins    Galen Manila 04/11/2023, 2:37 PM

## 2023-04-11 NOTE — Progress Notes (Signed)
Pt refused colace, educated on need.

## 2023-04-11 NOTE — TOC Initial Note (Signed)
Transition of Care [facility]) - Initial/Assessment Note    Patient Details  Name: Alexander Austin MRN: [id number] Date of Birth: 1946-11-16  Transition of Care [facility]) CM/SW Contact:    Carley Hammed, LCSWA Phone Number: 04/11/2023, 10:52 AM  Clinical Narrative:                  CSW met with pt at bedside to discuss SNF and provide OBS information. Pt states he is considering SNF at DC pending how he progresses. CSW explained process to pt. Pt confirmed [facility] is his primary insurance, he understands his bed choices will be determined by [facility] eligibility. OBS letter provided. CSW spoke with [facility] SW who confirmed pt is 100% connected. P[facility] Joseph Art [facility]. SW is [employer] ext [phone]. MD to sign paperwork and then it will be faxed to [phone]. TOC will continue to follow for additional needs.  Expected Discharge Plan: Skilled Nursing Facility Barriers to Discharge: Continued Medical Work up, Barriers Unresolved (comment) ([facility] pt, pending approval for SNF)   Patient Goals and CMS Choice Patient states their goals for this hospitalization and ongoing recovery are:: Pt states he is considering SNF vs. Home with Aides. CMS Medicare.gov Compare Post Acute Care list provided to:: Patient Choice offered to / list presented to : Patient [place] ownership interest in [facility].provided to:: Patient    Expected Discharge Plan and Services In-house Referral: Clinical Social Work   Post Acute Care Choice: Skilled Nursing Facility Living arrangements for the past 2 months: Single Family Home                                      Prior Living Arrangements/Services Living arrangements for the past 2 months: Single Family Home Lives with:: Self Patient language and need for interpreter reviewed:: Yes Do you feel safe going back to the place where you live?: Yes      Need for Family Participation in Patient Care: No (Comment) Care giver support system in place?:  Yes (comment)   Criminal Activity/Legal Involvement Pertinent to Current Situation/Hospitalization: No - Comment as needed  Activities of Daily Living Home Assistive Devices/Equipment: Wheelchair, Environmental consultant (specify type) ADL Screening (condition at time of admission) Patient's cognitive ability adequate to safely complete daily activities?: Yes Is the patient deaf or have difficulty hearing?: No Does the patient have difficulty seeing, even when wearing glasses/contacts?: Yes Does the patient have difficulty concentrating, remembering, or making decisions?: Yes Patient able to express need for assistance with ADLs?: Yes Does the patient have difficulty dressing or bathing?: Yes Independently performs ADLs?: No Communication: Independent Dressing (OT): Needs assistance Is this a change from baseline?: Change from baseline, expected to last >3 days Grooming: Needs assistance Is this a change from baseline?: Change from baseline, expected to last >3 days Feeding: Independent Bathing: Needs assistance Is this a change from baseline?: Change from baseline, expected to last >3 days Toileting: Needs assistance Is this a change from baseline?: Change from baseline, expected to last >3days In/Out Bed: Needs assistance Is this a change from baseline?: Change from baseline, expected to last >3 days Walks in Home: Needs assistance Is this a change from baseline?: Change from baseline, expected to last >3 days Does the patient have difficulty walking or climbing stairs?: Yes Weakness of Legs: Both Weakness of Arms/Hands: Both  Permission Sought/Granted Permission sought to share  information with : Family Supports, Oceanographer granted to share information with : No     Permission granted to share info w AGENCY: [facility]        Emotional Assessment Appearance:: Appears stated age Attitude/Demeanor/Rapport: Engaged Affect (typically observed):  Appropriate Orientation: : Oriented to Self, Oriented to Place, Oriented to  Time, Oriented to Situation Alcohol / Substance Use: Not Applicable Psych Involvement: No (comment)  Admission diagnosis:  Altered mental state [R41.82] Altered mental status, unspecified altered mental status type [R41.82] Patient Active Problem List   Diagnosis Date Noted   Altered mental state 04/09/2023   Acute on chronic diastolic CHF (congestive heart failure) 11/08/2022   Status post carotid endarterectomy 11/08/2022   Acute on chronic combined systolic and diastolic CHF (congestive heart failure) 11/08/2022   AICD (automatic cardioverter/defibrillator) present    Preoperative cardiovascular examination    Acute CVA (cerebrovascular accident) 09/13/2022   Internal carotid artery stenosis, left 09/13/2022   Skin wound from surgical incision 09/13/2022   Gout 07/03/2020   Gout flare 05/21/2020   Chronic diastolic CHF (congestive heart failure) 03/28/2020   Acute gout 03/07/2020   Chronic kidney disease, stage 3a 03/07/2020   Obesity (BMI 30.0-34.9) 03/07/2020   Tremor 04/16/2015   BPH (benign prostatic hyperplasia) 04/16/2015   Chronic anticoagulation 04/16/2015   Edema 04/16/2015   Urinary retention 03/06/2015   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 (HCC) 03/06/2015   NSTEMI (non-ST elevated myocardial infarction) 03/06/2015   PAF (paroxysmal atrial fibrillation) 03/06/2015   History of renal cell cancer 03/06/2015   Vitamin B12 deficiency 03/06/2015   Major depressive disorder, recurrent, in remission (HCC) 03/06/2015   History of stroke 03/06/2015   GERD (gastroesophageal reflux disease) 03/06/2015   Essential hypertension, benign 03/06/2015   Gout, arthritis 03/06/2015   PCP:  [facility] Pharmacy:   [facility] - [place], [place] - [address]  [place] [address] Phone: [phone] Fax: [phone]     Social Determinants of Health (SDOH) Social History: SDOH Screenings   Food Insecurity: No Food Insecurity (04/10/2023)  Housing: Low Risk  (04/10/2023)  Transportation Needs: No Transportation Needs (04/10/2023)  Utilities: Not At Risk (04/10/2023)  Alcohol Screen: Low Risk  (11/09/2022)  Financial Resource Strain: Low Risk  (11/09/2022)  Tobacco Use: Medium Risk (04/09/2023)   SDOH Interventions:     Readmission Risk Interventions    11/10/2022    2:56 PM  Readmission Risk Prevention Plan  Transportation Screening Complete  HRI or Home Care Consult Complete  Social Work Consult for Recovery Care Planning/Counseling Complete  Palliative Care Screening Not Applicable  Medication Review Oceanographer) Referral to Pharmacy

## 2023-04-12 DIAGNOSIS — R296 Repeated falls: Secondary | ICD-10-CM | POA: Diagnosis not present

## 2023-04-12 DIAGNOSIS — W19XXXA Unspecified fall, initial encounter: Secondary | ICD-10-CM

## 2023-04-12 LAB — CBC
HCT: 28.2 % — ABNORMAL LOW (ref 39.0–52.0)
Hemoglobin: 8.7 g/dL — ABNORMAL LOW (ref 13.0–17.0)
MCH: 25.1 pg — ABNORMAL LOW (ref 26.0–34.0)
MCHC: 30.9 g/dL (ref 30.0–36.0)
MCV: 81.3 fL (ref 80.0–100.0)
Platelets: 160 10*3/uL (ref 150–400)
RBC: 3.47 MIL/uL — ABNORMAL LOW (ref 4.22–5.81)
RDW: 16 % — ABNORMAL HIGH (ref 11.5–15.5)
WBC: 3.3 10*3/uL — ABNORMAL LOW (ref 4.0–10.5)
nRBC: 0 % (ref 0.0–0.2)

## 2023-04-12 LAB — BASIC METABOLIC PANEL
Anion gap: 8 (ref 5–15)
BUN: 27 mg/dL — ABNORMAL HIGH (ref 8–23)
CO2: 25 mmol/L (ref 22–32)
Calcium: 9.1 mg/dL (ref 8.9–10.3)
Chloride: 108 mmol/L (ref 98–111)
Creatinine, Ser: 2.13 mg/dL — ABNORMAL HIGH (ref 0.61–1.24)
GFR, Estimated: 31 mL/min — ABNORMAL LOW (ref >60)
Glucose, Bld: 102 mg/dL — ABNORMAL HIGH (ref 70–99)
Potassium: 3.7 mmol/L (ref 3.5–5.1)
Sodium: 141 mmol/L (ref 135–145)

## 2023-04-12 NOTE — Progress Notes (Signed)
Pt given oxycodone for pain, next dose can be given at 9:42 pm, pt made aware.

## 2023-04-12 NOTE — Progress Notes (Signed)
PROGRESS NOTE    Alexander Austin  AVW:[id number] DOB: 1946/09/18 DOA: 04/09/2023 PCP: [facility]    Brief Narrative:   KALANI STHILAIRE is a 77 y.o. male with past medical history significant for proximal small atrial fibrillation on Eliquis, HTN, CVA, chronic diastolic congestive heart failure, CAD s/p CABG, depression/anxiety, peripheral neuropathy who presented to [facility] ED on 4/20 from home via EMS with complaints of back pain, right elbow pain following fall at home.  Patient reports recently started on gabapentin by outpatient neurology roughly 3 weeks ago.  Does report his sleep and leg pain has improved but reports has had multiple falls since.    In the ED, temperature 97.4 F, HR 62, RR 18, BP 132/70, SpO2 99% on room air.  WC 3.9, hemoglobin 10.8, platelets 205.  Sodium 142, potassium 3.4, chloride 105, CO2 25, glucose 92, BUN 20, creatinine 2.18.  AST 13, ALT 10, total bilirubin 1.0.  CK23.  Urinalysis unrevealing.  CT head/C-spine with stable age-related cerebral atrophy, ventral megaly and periventricular white matter disease, no acute intracranial findings, normal alignment of the C-spine without acute fracture, diffuse ankylosis of the spine.  Left hip x-ray with degenerative changes, otherwise no fracture.  Right elbow with no evidence of acute fracture or dislocation, slight elevation of the anterior fat pad.  Chest x-ray with enlarged heart, stable dense left midlung nodule.  CT chest/abdomen/pelvis with no acute injury identified within the chest/abdomen/pelvis, stable surgical changes prior partial right nephrectomy, stable left adrenal gland adenoma, stable pulmonary scarring and benign pulmonary nodules.  TRH consulted for admission for further evaluation and management of recurrent falls, acute on chronic pain.  Assessment & Plan:   Acute on chronic pain Recurrent falls with gait disturbance Ankylosing spondylitis Patient presenting to the ED with  recurrent falls after recent initiation of gabapentin outpatient by his neurologist.  Gabapentin now discontinued. -- Tylenol 600 mg p.o. every 6 hours -- Oxycodone  PO q6h PRN moderate pain -- Continue PT/OT efforts while inpatient -- TOC consulted for SNF placement  Hypokalemia Repleted. --Repeat electrolytes in the a.m.  CKD stage IIIa Baseline creatinine 1.8-2.0. -- Cr 2.18>>2.30>2.02>1.97>2.05>2.13; stable -- Avoid nephrotoxins, renal dose all medications; avoid NSAIDs  Essential hypertension Chronic diastolic congestive heart failure, compensated -- Amlodipine 10 mg p.o. daily -- Carvedilol 12.5 g p.o. twice daily -- Furosemide 40 g p.o. twice daily -- Continue aspirin and statin  Paroxysmal fibrillation -- Carvedilol 12.5 mg p.o. twice daily -- Eliquis 5 g p.o. twice daily  CAD s/p CABG -- Plavix 75 mg. -- Atorvastatin 80 mg.  Depression/anxiety: -- Lexapro 20 mg p.o. daily  GERD -- Protonix 40 mg p.o. daily  DVT prophylaxis:  apixaban (ELIQUIS) tablet 5 mg    Code Status: Full Code Family Communication: No family present at bedside this morning  Disposition Plan:  Level of care: Med-Surg Status is: Inpatient Remains inpatient appropriate because: Pending SNF placement    Consultants:  None  Procedures:  None  Antimicrobials:  None   Subjective: Patient seen examined bedside, resting comfortably.  Lying in bed.  Pain controlled.  No specific complaints this morning.  No family present at bedside.  PT recommending SNF placement, TOC consulted.  Denies headache, no dizziness, no chest pain, no palpitations, no shortness of breath, no abdominal pain, no fever/chills/night sweats, no nausea/vomiting/diarrhea, no focal weakness, no fatigue, no paresthesias.  No acute events overnight per nursing staff.  Objective: Vitals:   04/11/23 1654 04/11/23 2108 04/12/23  0300 04/12/23 0820  BP: (!) 142/60 (!) 159/69 (!) 139/53 (!) 138/56  Pulse: (!) 59 65 (!)  55 (!) 52  Resp: 17 16 17 18   Temp: 98.2 F (36.8 C) 98.4 F (36.9 C) 98.6 F (37 C)   TempSrc: Oral Oral Oral   SpO2: 99% 99% 98% 98%  Weight:      Height:        Intake/Output Summary (Last 24 hours) at 04/12/2023 1506 Last data filed at 04/12/2023 1256 Gross per 24 hour  Intake 340 ml  Output 2700 ml  Net -2360 ml   Filed Weights   04/09/23 1520  Weight: 108.4 kg    Examination:  Physical Exam: GEN: NAD, alert and oriented x 3, wd/wn HEENT: NCAT, PERRL, EOMI, sclera clear, MMM PULM: CTAB w/o wheezes/crackles, normal respiratory effort, on room air CV: RRR w/o M/G/R GI: abd soft, NTND, NABS, no R/G/M MSK: Trace bilateral lower extremity peripheral edema, moves all extremities independently with preserved strength NEURO: CN II-XII intact, no focal deficits, sensation to light touch intact PSYCH: normal mood/affect Integumentary: dry/intact, no rashes or wounds    Data Reviewed: I have personally reviewed following labs and imaging studies  CBC: Recent Labs  Lab 04/09/23 1541 04/09/23 1551 04/10/23 0226 04/10/23 1054 04/12/23 0100  WBC 3.9*  --  3.8* 3.8* 3.3*  NEUTROABS 2.7  --   --   --   --   HGB 10.8* 11.2* 9.4* 9.4* 8.7*  HCT 34.5* 33.0* 29.7* 30.4* 28.2*  MCV 81.6  --  80.5 81.5 81.3  PLT 205  --  163 174 160   Basic Metabolic Panel: Recent Labs  Lab 04/09/23 1541 04/09/23 1551 04/10/23 0226 04/10/23 1054 04/10/23 1758 04/11/23 0224 04/12/23 0100  NA 142 144 143 142  --  141 141  K 3.4* 3.5 2.8* 3.0* 3.5 3.1* 3.7  CL 105 106 106 106  --  107 108  CO2 25  --  26 25  --  25 25  GLUCOSE 92 92 86 95  --  88 102*  BUN 20 24* 19 19  --  24* 27*  CREATININE 2.18* 2.30* 2.02* 1.97*  --  2.05* 2.13*  CALCIUM 9.4  --  9.0 9.0  --  8.8* 9.1  MG  --   --   --   --  1.9  --   --    GFR: Estimated Creatinine Clearance: 38.1 mL/min (A) (by C-G formula based on SCr of 2.13 mg/dL (H)). Liver Function Tests: Recent Labs  Lab 04/09/23 1541  AST 13*   ALT 10  ALKPHOS 79  BILITOT 1.0  PROT 6.7  ALBUMIN 3.7   No results for input(s): "LIPASE", "AMYLASE" in the last 168 hours. No results for input(s): "AMMONIA" in the last 168 hours. Coagulation Profile: No results for input(s): "INR", "PROTIME" in the last 168 hours. Cardiac Enzymes: Recent Labs  Lab 04/09/23 1541  CKTOTAL 23*   BNP (last 3 results) No results for input(s): "PROBNP" in the last 8760 hours. HbA1C: No results for input(s): "HGBA1C" in the last 72 hours. CBG: No results for input(s): "GLUCAP" in the last 168 hours. Lipid Profile: No results for input(s): "CHOL", "HDL", "LDLCALC", "TRIG", "CHOLHDL", "LDLDIRECT" in the last 72 hours. Thyroid Function Tests: Recent Labs    04/10/23 0226  TSH 2.012   Anemia Panel: Recent Labs    04/10/23 0226  VITAMINB12 265   Sepsis Labs: No results for input(s): "PROCALCITON", "LATICACIDVEN" in the last  168 hours.  No results found for this or any previous visit (from the past 240 hour(s)).       Radiology Studies: No results found.      Scheduled Meds:  acetaminophen  650 mg Oral Q6H   allopurinol  100 mg Oral Daily   amLODipine  10 mg Oral Daily   apixaban  5 mg Oral BID   atorvastatin  80 mg Oral QHS   calcitRIOL  0.25 mcg Oral Daily   carvedilol  12.5 mg Oral BID WC   clopidogrel  75 mg Oral Daily   docusate sodium  200 mg Oral Daily   escitalopram  20 mg Oral QHS   febuxostat  80 mg Oral Daily   furosemide  40 mg Oral BID   pantoprazole  40 mg Oral QAC breakfast   potassium chloride SA  20 mEq Oral Daily   primidone  100 mg Oral QHS   sodium chloride flush  3 mL Intravenous Q12H   Vitamin D (Ergocalciferol)  50,000 Units Oral Q7 days   Continuous Infusions:  sodium chloride       LOS: 1 day    Time spent: 52 minutes spent on chart review, discussion with nursing staff, consultants, updating family and interview/physical exam; more than 50% of that time was spent in counseling and/or  coordination of care.    Alvira Philips [place], DO [facility] Available via Epic secure chat 7am-7pm After these hours, please refer to coverage provider listed on [email or web address] 04/12/2023, 3:06 PM

## 2023-04-12 NOTE — TOC Progression Note (Signed)
Transition of Care [facility]) - Progression Note    Patient Details  Name: Alexander Austin MRN: [id number] Date of Birth: 1946/10/26  [facility]) CM/SW Contact  Carley Hammed, [place] Phone Number: 04/12/2023, 3:50 PM  Clinical Narrative:    CSW received confirmation that [facility] received pt's referral for SNF approval, prior to the 11AM deadline for review today. Awaiting determination and which facilities he is eligible for. TOC will continue to follow.    Expected Discharge Plan: Skilled Nursing Facility Barriers to Discharge: Continued Medical Work up, Barriers Unresolved (comment) ([facility] pt, pending approval for SNF)  Expected Discharge Plan and Services In-house Referral: Clinical Social Work   Post Acute Care Choice: Skilled Nursing Facility Living arrangements for the past 2 months: Single Family Home                                       Social Determinants of Health (SDOH) Interventions SDOH Screenings   Food Insecurity: No Food Insecurity (04/10/2023)  Housing: Low Risk  (04/10/2023)  Transportation Needs: No Transportation Needs (04/10/2023)  Utilities: Not At Risk (04/10/2023)  Alcohol Screen: Low Risk  (11/09/2022)  Financial Resource Strain: Low Risk  (11/09/2022)  Tobacco Use: Medium Risk (04/11/2023)    Readmission Risk Interventions    11/10/2022    2:56 PM  Readmission Risk Prevention Plan  Transportation Screening Complete  HRI or Home Care Consult Complete  Social Work Consult for Recovery Care Planning/Counseling Complete  Palliative Care Screening Not Applicable  Medication Review Oceanographer) Referral to Pharmacy

## 2023-04-12 NOTE — NC FL2 (Signed)
[place] MEDICAID [facility] LEVEL OF CARE FORM     IDENTIFICATION  Patient Name: Alexander Austin Birthdate: 1946/06/15 Sex: male Admission Date (Current Location): 04/09/2023  [facility] and [place] Number:  Producer, television/film/video and Address:  [facility] [place], [address], [place], [place] [address]      Provider Number: [id number]  Attending Physician Name and Address:  [place], Alvira Philips, DO  Relative Name and Phone Number:       Current Level of Care: Hospital Recommended Level of Care: Skilled Nursing Facility Prior Approval Number:    Date Approved/Denied:   PASRR Number: [id number]  Discharge Plan: SNF    Current Diagnoses: Patient Active Problem List   Diagnosis Date Noted   Altered mental state 04/09/2023   Acute on chronic diastolic CHF (congestive heart failure) 11/08/2022   Status post carotid endarterectomy 11/08/2022   Acute on chronic combined systolic and diastolic CHF (congestive heart failure) 11/08/2022   AICD (automatic cardioverter/defibrillator) present    Preoperative cardiovascular examination    Acute CVA (cerebrovascular accident) 09/13/2022   Internal carotid artery stenosis, left 09/13/2022   Skin wound from surgical incision 09/13/2022   Gout 07/03/2020   Gout flare 05/21/2020   Chronic diastolic CHF (congestive heart failure) 03/28/2020   Acute gout 03/07/2020   Chronic kidney disease, stage 3a 03/07/2020   Obesity (BMI 30.0-34.9) 03/07/2020   Tremor 04/16/2015   BPH (benign prostatic hyperplasia) 04/16/2015   Chronic anticoagulation 04/16/2015   Edema 04/16/2015   Urinary retention 03/06/2015   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 (HCC) 03/06/2015   NSTEMI (non-ST elevated myocardial infarction) 03/06/2015   PAF (paroxysmal atrial fibrillation) 03/06/2015   History of renal cell cancer 03/06/2015   Vitamin B12 deficiency 03/06/2015   Major depressive  disorder, recurrent, in remission (HCC) 03/06/2015   History of stroke 03/06/2015   GERD (gastroesophageal reflux disease) 03/06/2015   Essential hypertension, benign 03/06/2015   Gout, arthritis 03/06/2015    Orientation RESPIRATION BLADDER Height & Weight     Self, Situation, Time, Place  Normal Incontinent, External catheter Weight: 238 lb 15.7 oz (108.4 kg) Height:  6\' 2"  (188 cm)  BEHAVIORAL SYMPTOMS/MOOD NEUROLOGICAL BOWEL NUTRITION STATUS      Incontinent Diet (See DC summary)  AMBULATORY STATUS COMMUNICATION OF NEEDS Skin   Limited Assist Verbally Skin abrasions (Hip back and arm abrasions)                       Personal Care Assistance Level of Assistance  Bathing, Feeding, Dressing Bathing Assistance: Limited assistance Feeding assistance: Independent Dressing Assistance: Limited assistance     Functional Limitations Info  Sight, Hearing, Speech Sight Info: Adequate Hearing Info: Adequate Speech Info: Adequate    SPECIAL CARE FACTORS FREQUENCY  PT (By licensed PT), OT (By licensed OT)     PT Frequency: 5x week OT Frequency: 5x week            Contractures Contractures Info: Not present    Additional Factors Info  Code Status, Allergies, Psychotropic Code Status Info: Full Allergies Info: Isosorbide Nitrate  Shellfish-derived Products Psychotropic Info: Escitalopram         Current Medications (04/12/2023):  This is the current hospital active medication list Current Facility-Administered Medications  Medication Dose Route Frequency Provider Last Rate Last Admin   0.9 %  sodium chloride infusion  250 mL Intravenous PRN Reva Bores,  MD       acetaminophen (TYLENOL) tablet 650 mg  650 mg Oral Q6H Leandro Reasoner Tublu, MD   650 mg at 04/12/23 1126   albuterol (PROVENTIL) (2.5 MG/3ML) 0.083% nebulizer solution 2.5 mg  2.5 mg Inhalation TID PRN Reva Bores, MD       allopurinol (ZYLOPRIM) tablet 100 mg  100 mg Oral Daily Reva Bores, MD    100 mg at 04/12/23 0910   amLODipine (NORVASC) tablet 10 mg  10 mg Oral Daily Reva Bores, MD   10 mg at 04/12/23 0912   apixaban (ELIQUIS) tablet 5 mg  5 mg Oral BID Reva Bores, MD   5 mg at 04/12/23 0912   atorvastatin (LIPITOR) tablet 80 mg  80 mg Oral QHS Reva Bores, MD   80 mg at 04/11/23 2130   calcitRIOL (ROCALTROL) capsule 0.25 mcg  0.25 mcg Oral Daily Reva Bores, MD   0.25 mcg at 04/12/23 0911   carvedilol (COREG) tablet 12.5 mg  12.5 mg Oral BID WC Reva Bores, MD   12.5 mg at 04/12/23 0911   clopidogrel (PLAVIX) tablet 75 mg  75 mg Oral Daily Reva Bores, MD   75 mg at 04/12/23 0912   docusate sodium (COLACE) capsule 200 mg  200 mg Oral Daily Reva Bores, MD       escitalopram (LEXAPRO) tablet 20 mg  20 mg Oral QHS Reva Bores, MD   20 mg at 04/11/23 2131   febuxostat (ULORIC) tablet 80 mg  80 mg Oral Daily Reva Bores, MD   80 mg at 04/12/23 0912   fentaNYL (SUBLIMAZE) injection 12.5-50 mcg  12.5-50 mcg Intravenous Q2H PRN Reva Bores, MD       furosemide (LASIX) tablet 40 mg  40 mg Oral BID Reva Bores, MD   40 mg at 04/12/23 0912   metoprolol tartrate (LOPRESSOR) injection 5 mg  5 mg Intravenous Q6H PRN Reva Bores, MD       oxyCODONE (Oxy IR/ROXICODONE) immediate release tablet 5 mg  5 mg Oral Q6H PRN Leandro Reasoner Tublu, MD   5 mg at 04/11/23 2130   pantoprazole (PROTONIX) EC tablet 40 mg  40 mg Oral QAC breakfast Reva Bores, MD   40 mg at 04/12/23 0911   polyethylene glycol (MIRALAX / GLYCOLAX) packet 17 g  17 g Oral Daily PRN Reva Bores, MD       potassium chloride SA (KLOR-CON M) CR tablet 20 mEq  20 mEq Oral Daily Reva Bores, MD   20 mEq at 04/12/23 0911   primidone (MYSOLINE) tablet 100 mg  100 mg Oral QHS Reva Bores, MD   100 mg at 04/11/23 2130   sodium chloride flush (NS) 0.9 % injection 3 mL  3 mL Intravenous Q12H Reva Bores, MD   3 mL at 04/12/23 0918   sodium chloride flush (NS) 0.9 % injection 3 mL  3 mL  Intravenous PRN Reva Bores, MD       traZODone (DESYREL) tablet 25 mg  25 mg Oral QHS PRN Reva Bores, MD   25 mg at 04/11/23 2130   Vitamin D (Ergocalciferol) (DRISDOL) 1.25 MG (50000 UNIT) capsule 50,000 Units  50,000 Units Oral Q7 days Reva Bores, MD   50,000 Units at 04/10/23 1044     Discharge Medications: Please see discharge summary for a list of discharge medications.  Relevant Imaging  Results:  Relevant Lab Results:   Additional Information SS# [id number], [address]

## 2023-04-13 DIAGNOSIS — I480 Paroxysmal atrial fibrillation: Secondary | ICD-10-CM | POA: Diagnosis not present

## 2023-04-13 DIAGNOSIS — R404 Transient alteration of awareness: Secondary | ICD-10-CM

## 2023-04-13 DIAGNOSIS — N1832 Chronic kidney disease, stage 3b: Secondary | ICD-10-CM

## 2023-04-13 DIAGNOSIS — I10 Essential (primary) hypertension: Secondary | ICD-10-CM | POA: Diagnosis not present

## 2023-04-13 LAB — BASIC METABOLIC PANEL
Anion gap: 9 (ref 5–15)
BUN: 31 mg/dL — ABNORMAL HIGH (ref 8–23)
CO2: 26 mmol/L (ref 22–32)
Calcium: 8.7 mg/dL — ABNORMAL LOW (ref 8.9–10.3)
Chloride: 104 mmol/L (ref 98–111)
Creatinine, Ser: 2.18 mg/dL — ABNORMAL HIGH (ref 0.61–1.24)
GFR, Estimated: 30 mL/min — ABNORMAL LOW (ref >60)
Glucose, Bld: 137 mg/dL — ABNORMAL HIGH (ref 70–99)
Potassium: 3.3 mmol/L — ABNORMAL LOW (ref 3.5–5.1)
Sodium: 139 mmol/L (ref 135–145)

## 2023-04-13 LAB — MAGNESIUM: Magnesium: 2 mg/dL (ref 1.7–2.4)

## 2023-04-13 MED ORDER — CARVEDILOL 6.25 MG PO TABS
6.2500 mg | ORAL_TABLET | Freq: Two times a day (BID) | ORAL | Status: DC
  Administered 2023-04-14: 6.25 mg via ORAL
  Filled 2023-04-13: qty 1

## 2023-04-13 MED ORDER — POTASSIUM CHLORIDE CRYS ER 20 MEQ PO TBCR
30.0000 meq | EXTENDED_RELEASE_TABLET | Freq: Once | ORAL | Status: AC
  Administered 2023-04-13: 30 meq via ORAL
  Filled 2023-04-13: qty 1

## 2023-04-13 NOTE — Progress Notes (Signed)
Occupational Therapy Treatment Patient Details Name: Alexander Austin MRN: [id number] DOB: [birth year] Today's Date: 04/13/2023   History of present illness Pt is a 77 y.o. M who presents 04/09/2023 as a level 2 trauma for fall on blood thinners and has been unable to walk. Pt reports  placed on gabapentin by neurologist outside of the [place] system approximately 3 weeks ago. Significant PMH: paroxysmal A-fib on Eliquis, HTN, CVA on Plavix, CHF, CAD status post CABG, depression, with some ongoing neuropathy   OT comments  Pt making progress with functional goals. Pt sat EOB and had urine soaked linens underneath him and states that nobody got him up since therapy saw him last, however pt's RN reports that pt refused getting OOB for bathing this morning with CNA. Pt educated on being OOB as much as much as possible to help with regaining strength, endurance and safe mobility. OT will continue to follow acutely to maximize level of function and safety   Recommendations for follow up therapy are one component of a multi-disciplinary discharge planning process, led by the attending physician.  Recommendations may be updated based on patient status, additional functional criteria and insurance authorization.    Assistance Recommended at Discharge Frequent or constant Supervision/Assistance  Patient can return home with the following  A lot of help with bathing/dressing/bathroom;A lot of help with walking and/or transfers;Direct supervision/assist for medications management;Assist for transportation;Help with stairs or ramp for entrance   Equipment Recommendations  None recommended by OT    Recommendations for Other Services      Precautions / Restrictions Precautions Precautions: Fall;Other (comment) Precaution Comments: incontinence B/B Restrictions Weight Bearing Restrictions: No       Mobility Bed Mobility   Bed Mobility: Sit to Sidelying, Rolling   Sidelying to sit: Min assist             Transfers Overall transfer level: Needs assistance Equipment used: Rolling walker (2 wheels) Transfers: Sit to/from Stand Sit to Stand: Mod assist                 Balance Overall balance assessment: Needs assistance Sitting-balance support: No upper extremity supported, Feet supported Sitting balance-Leahy Scale: Fair     Standing balance support: Bilateral upper extremity supported, Reliant on assistive device for balance Standing balance-Leahy Scale: Poor                             ADL either performed or assessed with clinical judgement   ADL Overall ADL's : Needs assistance/impaired     Grooming: Wash/dry hands;Wash/dry face;Sitting;Set up;Supervision/safety   Upper Body Bathing: Min guard;Sitting   Lower Body Bathing: Moderate assistance;Sitting/lateral leans Lower Body Bathing Details (indicate cue type and reason): simulated Upper Body Dressing : Min guard;Sitting       Toilet Transfer: Moderate assistance;Stand-pivot;Rolling walker (2 wheels);Cueing for safety Toilet Transfer Details (indicate cue type and reason): simulated chair to bed Toileting- Clothing Manipulation and Hygiene: Maximal assistance;Sit to/from stand       Functional mobility during ADLs: Moderate assistance;Cueing for safety;Rolling walker (2 wheels)      Extremity/Trunk Assessment Upper Extremity Assessment Upper Extremity Assessment: Generalized weakness   Lower Extremity Assessment Lower Extremity Assessment: Defer to PT evaluation        Vision Ability to See in Adequate Light: 0 Adequate Patient Visual Report: No change from baseline     Perception     Praxis      Cognition Arousal/Alertness: Awake/alert  Behavior During Therapy: WFL for tasks assessed/performed Overall Cognitive Status: Within Functional Limits for tasks assessed                                          Exercises      Shoulder Instructions       General  Comments      Pertinent Vitals/ Pain       Pain Assessment Pain Assessment: 0-10 Pain Score: 3  Pain Location: low back Pain Descriptors / Indicators: Discomfort, Aching Pain Intervention(s): Monitored during session, Repositioned  Home Living                                          Prior Functioning/Environment              Frequency  Min 2X/week        Progress Toward Goals  OT Goals(current goals can now be found in the care plan section)  Progress towards OT goals: Progressing toward goals     Plan Discharge plan remains appropriate    Co-evaluation                 AM-PAC OT "6 Clicks" Daily Activity     Outcome Measure   Help from another person eating meals?: None Help from another person taking care of personal grooming?: A Little Help from another person toileting, which includes using toliet, bedpan, or urinal?: A Lot Help from another person bathing (including washing, rinsing, drying)?: A Lot Help from another person to put on and taking off regular upper body clothing?: A Little Help from another person to put on and taking off regular lower body clothing?: Total 6 Click Score: 15    End of Session Equipment Utilized During Treatment: Gait belt;Rolling walker (2 wheels)  OT Visit Diagnosis: Unsteadiness on feet (R26.81);Other abnormalities of gait and mobility (R26.89);Muscle weakness (generalized) (M62.81);Pain Pain - part of body:  (back)   Activity Tolerance Patient tolerated treatment well   Patient Left in chair;with call bell/phone within reach;with chair alarm set   Nurse Communication Mobility status        Time: 1610-9604 OT Time Calculation (min): 19 min  Charges: OT General Charges $OT Visit: 1 Visit OT Treatments $Self Care/Home Management : 8-22 mins   Galen Manila 04/13/2023, 12:23 PM

## 2023-04-13 NOTE — Plan of Care (Signed)
°  Problem: Clinical Measurements: °Goal: Diagnostic test results will improve °Outcome: Not Progressing °  °Problem: Safety: °Goal: Ability to remain free from injury will improve °Outcome: Not Progressing °  °Problem: Pain Managment: °Goal: General experience of comfort will improve °Outcome: Not Progressing °  °

## 2023-04-13 NOTE — Plan of Care (Signed)
  Problem: Education: Goal: Knowledge of General Education information will improve Description: Including pain rating scale, medication(s)/side effects and non-pharmacologic comfort measures Outcome: Progressing   Problem: Health Behavior/Discharge Planning: Goal: Ability to manage health-related needs will improve Outcome: Progressing   Problem: Clinical Measurements: Goal: Will remain free from infection Outcome: Progressing   Problem: Clinical Measurements: Goal: Ability to maintain clinical measurements within normal limits will improve Outcome: Progressing   Problem: Activity: Goal: Risk for activity intolerance will decrease Outcome: Progressing   Problem: Nutrition: Goal: Adequate nutrition will be maintained Outcome: Progressing   Problem: Coping: Goal: Level of anxiety will decrease Outcome: Progressing   Problem: Elimination: Goal: Will not experience complications related to bowel motility Outcome: Progressing   Problem: Pain Managment: Goal: General experience of comfort will improve Outcome: Progressing   Problem: Safety: Goal: Ability to remain free from injury will improve Outcome: Progressing   Problem: Skin Integrity: Goal: Risk for impaired skin integrity will decrease Outcome: Progressing

## 2023-04-13 NOTE — TOC Progression Note (Addendum)
Transition of Care [facility]) - Progression Note    Patient Details  Name: Alexander Austin MRN: [id number] Date of Birth: 17-Dec-1946  [facility]) CM/SW Contact  Carley Hammed, LCSW Phone Number: 04/13/2023, 12:04 PM  Clinical Narrative:    CSW left VM for [facility] social worker, requesting update on SNF approval request. TOC will continue to follow.   1:40 CSW attempted to contact [facility] social worker again, will continue to follow.   3:00 CSW received a call back from [place] with the [place]. Pt has been approved to go to SNF at the [facility] location. Address is [address] [place] [address]. [address] 1st floor. Accepting MD is Dr. Shirlyn Goltz, her fax is [phone]. Nurses station is [phone] EXT. [phone]. Pt needs to arrive before 12 via [facility]. Medical team notified and agreeable to DC tomorrow. [facility] requesting a call at EXT. [phone] when transport is arranged. CSW updated pt and spoke with his Dierdre Harness at bedside. They are agreeable to plan. TOC will continue to follow for DC needs.   Expected Discharge Plan: Skilled Nursing Facility Barriers to Discharge: Continued Medical Work up, Barriers Unresolved (comment) ([facility] pt, pending approval for SNF)  Expected Discharge Plan and Services In-house Referral: Clinical Social Work   Post Acute Care Choice: Skilled Nursing Facility Living arrangements for the past 2 months: Single Family Home                                       Social Determinants of Health (SDOH) Interventions SDOH Screenings   Food Insecurity: No Food Insecurity (04/10/2023)  Housing: Low Risk  (04/10/2023)  Transportation Needs: No Transportation Needs (04/10/2023)  Utilities: Not At Risk (04/10/2023)  Alcohol Screen: Low Risk  (11/09/2022)  Financial Resource Strain: Low Risk  (11/09/2022)  Tobacco Use: Medium Risk (04/11/2023)    Readmission Risk Interventions    11/10/2022    2:56 PM  Readmission Risk Prevention Plan  Transportation  Screening Complete  HRI or Home Care Consult Complete  Social Work Consult for Recovery Care Planning/Counseling Complete  Palliative Care Screening Not Applicable  Medication Review Oceanographer) Referral to Pharmacy

## 2023-04-13 NOTE — Progress Notes (Signed)
PROGRESS NOTE    Alexander Austin  ZOX:[id number] DOB: 08/28/1946 DOA: 04/09/2023 PCP: [facility]    Brief Narrative:   Alexander Austin is a 77 y.o. male with past medical history significant for proximal small atrial fibrillation on Eliquis, HTN, CVA, chronic diastolic congestive heart failure, CAD s/p CABG, depression/anxiety, peripheral neuropathy who presented to [facility] ED on 4/20 from home via EMS with complaints of back pain, right elbow pain following fall at home.  Patient reports recently started on gabapentin by outpatient neurology roughly 3 weeks ago.  Does report his sleep and leg pain has improved but reports has had multiple falls since.    Assessment & Plan:   Acute on chronic pain Recurrent falls with gait disturbance Ankylosing spondylitis Patient presenting to the ED with recurrent falls after recent initiation of gabapentin outpatient by his neurologist.  Gabapentin now discontinued. -- Tylenol 600 mg p.o. every 6 hours -- Oxycodone  PO q6h PRN moderate pain -- Continue PT/OT efforts while inpatient -- TOC consulted for SNF placement  Bradycardia Mild, ongoing since hospitalization.  Patient's heart rate has ranged from the high 50s to low 60s.  I have decreased his home Coreg from 12.5 to 6.25 twice daily.  Hypokalemia Replacing as needed.  Follow labs.  CKD stage IIIb Baseline creatinine 1.8-2.0. -- Cr 2.18>>2.30>2.02>1.97>2.05>2.13; stable -- Avoid nephrotoxins, renal dose all medications; avoid NSAIDs  Essential hypertension Chronic diastolic congestive heart failure, compensated -- Amlodipine 10 mg p.o. daily -- Carvedilol 12.5 g p.o. twice daily -- Furosemide 40 g p.o. twice daily -- Continue aspirin and statin Recheck BMP in the morning  Paroxysmal fibrillation -- Carvedilol decreased as above -- Eliquis 5 g p.o. twice daily  CAD s/p CABG -- Plavix 75 mg. -- Atorvastatin 80 mg.  Depression/anxiety: -- Lexapro 20 mg  p.o. daily  GERD -- Protonix 40 mg p.o. daily  DVT prophylaxis:  apixaban (ELIQUIS) tablet 5 mg    Code Status: Full Code Family Communication: No family present at bedside this morning  Disposition Plan:  Level of care: Med-Surg Status is: Inpatient Remains inpatient appropriate because: Pending SNF placement/approval from [place]    Consultants:  None  Procedures:  None  Antimicrobials:  None   Subjective: Patient with no complaints  Objective: Vitals:   04/12/23 1621 04/12/23 2125 04/13/23 0357 04/13/23 0807  BP: (!) 133/49 (!) 144/51 (!) 128/57 (!) 140/70  Pulse: (!) 59 (!) 59 (!) 50 (!) 58  Resp: Temp: 98.6 F (37 C) 97.8 F (36.6 C) 97.7 F (36.5 C) 98 F (36.7 C)  TempSrc: Oral Oral Oral Oral  SpO2: 100% 99% 91% 99%  Weight:      Height:        Intake/Output Summary (Last 24 hours) at 04/13/2023 1451 Last data filed at 04/13/2023 1300 Gross per 24 hour  Intake 1180 ml  Output 1400 ml  Net -220 ml    Filed Weights   04/09/23 1520  Weight: 108.4 kg    Examination:  Physical Exam: GEN: No acute distress, alert and oriented x 2 HEENT: Normocephalic, atraumatic, mucous membranes slightly dry PULM: Clear to auscultation bilaterally CV: Regular rate and rhythm, S1-S2 GI: Soft, nontender, nondistended, positive bowel sounds MSK: No clubbing or cyanosis, trace pitting edema NEURO: No focal deficits PSYCH: normal mood/affect, appropriate Integumentary: No skin breaks, tears or lesions    Data Reviewed: I have personally reviewed following labs and imaging studies  CBC: Recent Labs  Lab 04/09/23 1541 04/09/23 1551 04/10/23 0226 04/10/23 1054 04/12/23 0100  WBC 3.9*  --  3.8* 3.8* 3.3*  NEUTROABS 2.7  --   --   --   --   HGB 10.8* 11.2* 9.4* 9.4* 8.7*  HCT 34.5* 33.0* 29.7* 30.4* 28.2*  MCV 81.6  --  80.5 81.5 81.3  PLT 205  --  163 174 160    Basic Metabolic Panel: Recent Labs  Lab 04/10/23 0226 04/10/23 1054  04/10/23 1758 04/11/23 0224 04/12/23 0100 04/13/23 0132  NA 143 142  --  141 141 139  K 2.8* 3.0* 3.5 3.1* 3.7 3.3*  CL 106 106  --  107 108 104  CO2 26 25  --  25 25 26   GLUCOSE 86 95  --  88 102* 137*  BUN 19 19  --  24* 27* 31*  CREATININE 2.02* 1.97*  --  2.05* 2.13* 2.18*  CALCIUM 9.0 9.0  --  8.8* 9.1 8.7*  MG  --   --  1.9  --   --  2.0    GFR: Estimated Creatinine Clearance: 37.2 mL/min (A) (by C-G formula based on SCr of 2.18 mg/dL (H)). Liver Function Tests: Recent Labs  Lab 04/09/23 1541  AST 13*  ALT 10  ALKPHOS 79  BILITOT 1.0  PROT 6.7  ALBUMIN 3.7    No results for input(s): "LIPASE", "AMYLASE" in the last 168 hours. No results for input(s): "AMMONIA" in the last 168 hours. Coagulation Profile: No results for input(s): "INR", "PROTIME" in the last 168 hours. Cardiac Enzymes: Recent Labs  Lab 04/09/23 1541  CKTOTAL 23*    BNP (last 3 results) No results for input(s): "PROBNP" in the last 8760 hours. HbA1C: No results for input(s): "HGBA1C" in the last 72 hours. CBG: No results for input(s): "GLUCAP" in the last 168 hours. Lipid Profile: No results for input(s): "CHOL", "HDL", "LDLCALC", "TRIG", "CHOLHDL", "LDLDIRECT" in the last 72 hours. Thyroid Function Tests: No results for input(s): "TSH", "T4TOTAL", "FREET4", "T3FREE", "THYROIDAB" in the last 72 hours.  Anemia Panel: No results for input(s): "VITAMINB12", "FOLATE", "FERRITIN", "TIBC", "IRON", "RETICCTPCT" in the last 72 hours.  Sepsis Labs: No results for input(s): "PROCALCITON", "LATICACIDVEN" in the last 168 hours.  No results found for this or any previous visit (from the past 240 hour(s)).       Radiology Studies: No results found.      Scheduled Meds:  acetaminophen  650 mg Oral Q6H   allopurinol  100 mg Oral Daily   amLODipine  10 mg Oral Daily   apixaban  5 mg Oral BID   atorvastatin  80 mg Oral QHS   calcitRIOL  0.25 mcg Oral Daily   carvedilol  6.25 mg Oral BID  WC   clopidogrel  75 mg Oral Daily   docusate sodium  200 mg Oral Daily   escitalopram  20 mg Oral QHS   febuxostat  80 mg Oral Daily   furosemide  40 mg Oral BID   pantoprazole  40 mg Oral QAC breakfast   potassium chloride SA  20 mEq Oral Daily   primidone  100 mg Oral QHS   sodium chloride flush  3 mL Intravenous Q12H   Vitamin D (Ergocalciferol)  50,000 Units Oral Q7 days   Continuous Infusions:  sodium chloride       LOS: 2 days     Hollice Espy, MD [facility] Available via Epic secure chat 7am-7pm After these hours, please refer  to coverage provider listed on [email or web address] 04/13/2023, 2:51 PM

## 2023-04-14 DIAGNOSIS — I480 Paroxysmal atrial fibrillation: Secondary | ICD-10-CM | POA: Diagnosis not present

## 2023-04-14 DIAGNOSIS — R404 Transient alteration of awareness: Secondary | ICD-10-CM | POA: Diagnosis not present

## 2023-04-14 DIAGNOSIS — N1832 Chronic kidney disease, stage 3b: Secondary | ICD-10-CM | POA: Diagnosis not present

## 2023-04-14 DIAGNOSIS — I10 Essential (primary) hypertension: Secondary | ICD-10-CM | POA: Diagnosis not present

## 2023-04-14 LAB — BASIC METABOLIC PANEL
Anion gap: 12 (ref 5–15)
BUN: 33 mg/dL — ABNORMAL HIGH (ref 8–23)
CO2: 27 mmol/L (ref 22–32)
Calcium: 9.3 mg/dL (ref 8.9–10.3)
Chloride: 103 mmol/L (ref 98–111)
Creatinine, Ser: 1.92 mg/dL — ABNORMAL HIGH (ref 0.61–1.24)
GFR, Estimated: 35 mL/min — ABNORMAL LOW (ref >60)
Glucose, Bld: 80 mg/dL (ref 70–99)
Potassium: 3.7 mmol/L (ref 3.5–5.1)
Sodium: 142 mmol/L (ref 135–145)

## 2023-04-14 LAB — MAGNESIUM: Magnesium: 2.2 mg/dL (ref 1.7–2.4)

## 2023-04-14 LAB — BRAIN NATRIURETIC PEPTIDE: B Natriuretic Peptide: 127.1 pg/mL — ABNORMAL HIGH (ref 0.0–100.0)

## 2023-04-14 MED ORDER — HYDRALAZINE HCL 10 MG PO TABS: 50.0000 mg | ORAL_TABLET | Freq: Three times a day (TID) | ORAL | 1 refills | Status: AC

## 2023-04-14 MED ORDER — CARVEDILOL 6.25 MG PO TABS: 6.2500 mg | ORAL_TABLET | Freq: Two times a day (BID) | ORAL | 1 refills | Status: AC

## 2023-04-14 MED ORDER — ACETAMINOPHEN 325 MG PO TABS: 650.0000 mg | ORAL_TABLET | Freq: Four times a day (QID) | ORAL | Status: AC | PRN

## 2023-04-14 NOTE — Progress Notes (Signed)
AVS reviewed with Delice Bison, RN at [facility] facility. PT will be going there via [facility]. IV removed.

## 2023-04-14 NOTE — TOC Transition Note (Signed)
Transition of Care [facility]) - CM/SW Discharge Note   Patient Details  Name: Alexander Austin MRN: [id number] Date of Birth: [birth year]  Transition of Care [facility]) CM/SW Contact:  Carley Hammed, LCSW Phone Number: 04/14/2023, 8:41 AM   Clinical Narrative:    Pt to be transported to [facility].  Address is [address] [place] [address]. [address] 1st floor. Accepting MD is Dr. Shirlyn Goltz, her fax is [phone]. Call to report is Nurses station is [phone] EXT. [phone]. Pt needs to arrive before 12 via [facility].   Final next level of care: Skilled Nursing Facility Barriers to Discharge: Barriers Resolved   Patient Goals and CMS Choice CMS Medicare.gov Compare Post Acute Care list provided to:: Patient Choice offered to / list presented to : Patient  Discharge Placement                Patient chooses bed at:  [facility]) Patient to be transferred to facility by: [facility] Name of family member notified: Patient Patient and family notified of of transfer: 04/14/23  Discharge Plan and Services Additional resources added to the After Visit Summary for   In-house Referral: Clinical Social Work   Post Acute Care Choice: Skilled Nursing Facility                               Social Determinants of Health (SDOH) Interventions SDOH Screenings   Food Insecurity: No Food Insecurity (04/10/2023)  Housing: Low Risk  (04/10/2023)  Transportation Needs: No Transportation Needs (04/10/2023)  Utilities: Not At Risk (04/10/2023)  Alcohol Screen: Low Risk  (11/09/2022)  Financial Resource Strain: Low Risk  (11/09/2022)  Tobacco Use: Medium Risk (04/11/2023)     Readmission Risk Interventions    11/10/2022    2:56 PM  Readmission Risk Prevention Plan  Transportation Screening Complete  HRI or Home Care Consult Complete  Social Work Consult for Recovery Care Planning/Counseling Complete  Palliative Care Screening Not Applicable  Medication Review Oceanographer) Referral  to Pharmacy

## 2023-04-14 NOTE — Discharge Summary (Signed)
Physician Discharge Summary   Patient: Alexander Austin MRN: [id number] DOB: [birth year]  Admit date:     04/09/2023  Discharge date: 04/14/23  Discharge Physician: Hollice Espy   PCP: [facility]   Recommendations at discharge:   Medication change: Neurontin discontinued Medication change: Coreg decreased from 12.5 to 6.25 mg p.o. twice daily New medication: Tylenol 650 p.o. every 6 hours as needed for mild pain, headache or fever Medication clarification: Edoxaban formally discontinued.  Patient has had this listed as a historical medication, but has not been on this for some time. Medication change: Hydralazine decreased from 50 mg p.o. 3 times daily to 10 mg p.o. 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 [facility] Course: Patient is 77 year old male with history of paroxysmal A-fib on Eliquis, HTN, CVA on Plavix, CHF, CAD status post CABG, depression, with some ongoing neuropathy.  He was placed on gabapentin by neurologist outside of the [place] system approximately 3 weeks ago.  He does report his sleep is better and possibly has leg pain is better however since he started that medication he is fallen multiple times including today where he was found on the floor.  He was brought in as a level 2  trauma for fall on blood thinners and has been unable to walk.  Trauma scans were all negative except for swelling at the right elbow.  We are asked to admit overnight to see if he returns to his baseline by the morning.  Assessment and Plan: Acute on chronic pain Recurrent falls with gait disturbance Ankylosing spondylitis Patient presenting to the ED with recurrent falls after recent initiation of gabapentin outpatient by his neurologist.  Gabapentin now discontinued. -- Tylenol 600 mg p.o. every 6 hours -- Initially, he had been placed on oxycodone as a substitute, but has not needed this for several days.  Should pain persist, consideration for ultrasound -- Continue PT/OT efforts while inpatient -- [facility] consulted for SNF placement   Bradycardia Mild, ongoing since hospitalization.  Patient's heart rate has ranged from the high 50s to low 60s.  I have decreased his home Coreg from 12.5 to 6.25 twice daily.  Blood pressures have been slightly higher.  Patient hydralazine 50 mg 3 times daily has been discontinued since admission, restarted at a much lower dose of 10 mg 3 times daily.   Hypokalemia Replacing as needed.  Follow labs.   CKD stage IIIb Baseline creatinine 1.8-2.0. -- Cr 2.18>>2.30>2.02>1.97>2.05>2.13; stable -- Avoid nephrotoxins, renal dose all medications; avoid NSAIDs   Essential hypertension Chronic diastolic congestive heart failure, compensated -- Amlodipine 10 mg p.o. daily -- Carvedilol 12.5 g p.o. twice daily -- Furosemide 40 g p.o. twice daily -- Continue aspirin and statin BNP on morning of discharge normal   Paroxysmal fibrillation -- Carvedilol decreased as above -- Eliquis 5 g p.o. twice daily  Please note the patient had edoxaban listed in his home medication regimen although not given during this hospitalization.  Pulmonary discontinued off of his medication list   CAD s/p CABG -- Plavix 75 mg. -- Atorvastatin 80 mg.   Depression/anxiety: --  Lexapro 20 mg p.o. daily   GERD -- Protonix 40 mg p.o. daily        Consultants: None Procedures performed: None Disposition: Skilled nursing Diet recommendation:  Heart healthy DISCHARGE MEDICATION: Allergies as of 04/14/2023       Reactions   Isosorbide Nitrate Anaphylaxis   Other reaction(s): Cardiovascular Arrest (ALLERGY/intolerance) Can take Sublingual Nitro.   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   albuterol 108 (90 Base) MCG/ACT inhaler Commonly known as: VENTOLIN HFA Inhale 2 puffs into the lungs 3 (three) times daily as needed for wheezing or shortness of breath.   allopurinol 100 MG tablet Commonly known as: ZYLOPRIM Take 100 mg by mouth daily.   amLODipine 10 MG tablet Commonly known as: NORVASC Take 10 mg by mouth daily.   apixaban 5 MG Tabs tablet Commonly known as: ELIQUIS Take 5 mg by mouth 2 (two) times daily.   atorvastatin 80 MG tablet Commonly known as: LIPITOR Take 80 mg by mouth at bedtime.   calcitRIOL 0.25 MCG capsule Commonly known as: ROCALTROL Take 0.25 mcg by mouth daily.   carvedilol 6.25 MG tablet Commonly known as: COREG Take 1 tablet (6.25 mg total) by mouth 2 (two) times daily with a meal. What changed:  medication strength how much to take   clopidogrel 75 MG tablet Commonly known as: PLAVIX Take 75 mg by mouth daily.   colchicine 0.6 MG tablet Take 0.5 tablets (0.3 mg total) by mouth See admin instructions. Take 0.3mg  on Monday, Wednesday, Friday, and Sunday.   DSS 100 MG Caps Take 200 mg by mouth daily.    escitalopram 20 MG tablet Commonly known as: LEXAPRO Take 20 mg by mouth at bedtime.   Febuxostat 80 MG Tabs Take 80 mg by mouth daily.   furosemide 40 MG tablet Commonly known as: LASIX Take 1 tablet (40 mg total) by mouth 2 (two) times daily.   hydrALAZINE 10 MG tablet Commonly known as: APRESOLINE Take 5 tablets (50 mg total) by mouth 3 (three) times daily. What changed: medication strength   pantoprazole 40 MG tablet Commonly known as: PROTONIX Take 40 mg by mouth daily before breakfast.   potassium chloride SA 20 MEQ tablet Commonly known as: KLOR-CON M Take 1 tablet (20 mEq total) by mouth daily.   primidone 50 MG tablet Commonly known as: MYSOLINE Take 100 mg by mouth in the morning and at bedtime.   traZODone 50 MG tablet Commonly known as: DESYREL Take 25 mg by mouth at bedtime as needed for sleep.   Vitamin D (Ergocalciferol) 1.25 MG (50000 UNIT) Caps capsule Commonly known as: DRISDOL Take 50,000 Units by mouth every 7 (seven) days. Sunday        Discharge Exam: Ceasar Mons Weights   04/09/23  1520  Weight: 108.4 kg   General: Alert and oriented x 2-3, no acute distress Cardiovascular: Regular rate and rhythm, S1-S2  Condition at discharge: good  The results of significant diagnostics from this hospitalization (including imaging, microbiology, ancillary and laboratory) are listed below for reference.   Imaging Studies: CT CHEST ABDOMEN PELVIS WO CONTRAST  Result Date: 04/09/2023 CLINICAL DATA:  Larey Seat.  Polytrauma. EXAM: CT CHEST, ABDOMEN AND PELVIS WITHOUT CONTRAST TECHNIQUE: Multidetector CT imaging of the chest, abdomen and pelvis was performed following the standard protocol without IV contrast. RADIATION DOSE REDUCTION: This exam was performed according to the departmental dose-optimization program which includes automated exposure control, adjustment of the mA and/or kV according to patient size and/or use of iterative reconstruction technique.  COMPARISON:  01/30/2020 FINDINGS: CT CHEST FINDINGS Cardiovascular: The heart is normal in size. No pericardial effusion. There is tortuosity and calcification of the thoracic aorta but no aneurysm. Three-vessel coronary artery calcifications are noted along with prior surgical changes from coronary artery bypass surgery. Mediastinum/Nodes: No mediastinal or hilar mass or lymphadenopathy. Scattered calcified lymph nodes are noted. The esophagus is unremarkable. Lungs/Pleura: Stable large calcified granuloma in the left upper lobe along with scattered small noncalcified granulomas. These are unchanged since 2019 and considered benign. No new pulmonary lesions or pulmonary nodules. Chronic pulmonary scarring changes. No pleural effusions or pleural lesions. Musculoskeletal: No chest wall mass, supraclavicular or axillary adenopathy. CT ABDOMEN PELVIS FINDINGS Hepatobiliary: No evidence of acute hepatic injury and no perihepatic hematoma. No hepatic lesions or intrahepatic biliary dilatation. No common bile duct dilatation. Pancreas: No acute pancreatic injury or peripancreatic fluid collection. No mass or inflammation. Spleen: Intact. No acute splenic injury or perisplenic hematoma. Small scattered calcified granulomas are noted. Adrenals/Urinary Tract: Stable 18 mm left adrenal gland nodule measuring 9 Hounsfield units and consistent with a benign adenoma. No further imaging evaluation or follow-up is necessary. The right adrenal gland is normal. Surgical changes from a prior partial right nephrectomy. No findings for recurrent tumor. No renal or obstructing ureteral calculi. The bladder is unremarkable. Stomach/Bowel: The stomach, duodenum, small bowel and colon are unremarkable. No acute inflammatory process, mass lesions or obstructive findings. The terminal ileum and appendix are normal. Vascular/Lymphatic: Stable atherosclerotic calcifications involving the aorta and branch vessels but no aneurysm. No mesenteric  or retroperitoneal mass, adenopathy or hematoma. Reproductive: The prostate gland is mildly enlarged. The seminal vesicles are unremarkable. Other: No free air or free fluid. Musculoskeletal: The bony structures are intact. No hip or pelvic fractures. The pubic symphysis and SI joints are maintained. Degenerative changes involving the spine and remote postsurgical changes but no acute spine fracture. IMPRESSION: 1. No acute injury is identified involving the chest, abdomen or pelvis. 2. Stable surgical changes from a prior partial right nephrectomy. No findings for recurrent tumor. 3. Stable left adrenal gland adenoma. 4. Stable pulmonary scarring changes and benign pulmonary nodules. 5. Stable atherosclerotic calcifications involving the aorta and branch vessels including the coronary arteries. Aortic Atherosclerosis (ICD10-I70.0). Electronically Signed   By: Rudie Meyer M.D.   On: 04/09/2023 16:59   CT Head Wo Contrast  Result Date: 04/09/2023 CLINICAL DATA:  Larey Seat. EXAM: CT HEAD WITHOUT CONTRAST CT CERVICAL SPINE WITHOUT CONTRAST TECHNIQUE: Multidetector CT imaging of the head and cervical spine was performed following the standard protocol without intravenous contrast. Multiplanar CT image reconstructions of the cervical spine were also generated. RADIATION DOSE REDUCTION: This exam was performed according to the departmental dose-optimization program which includes automated exposure control, adjustment  of the mA and/or kV according to patient size and/or use of iterative reconstruction technique. COMPARISON:  CT scan 01/13/2023 FINDINGS: CT HEAD FINDINGS Brain: Stable age related cerebral atrophy, ventriculomegaly and periventricular white matter disease. No extra-axial fluid collections are identified. No CT findings for acute hemispheric infarction or intracranial hemorrhage. No mass lesions. The brainstem and cerebellum are normal. Vascular: Stable advanced atherosclerotic calcifications involving the  major vascular structures but no aneurysm hyperdense vessels. Skull: No acute skull fracture bone lesions. Sinuses/Orbits: The paranasal sinuses and mastoid air cells are clear. The globes are intact. Other: No scalp lesions or scalp hematoma. CT CERVICAL SPINE FINDINGS Alignment: Normal Skull base and vertebrae: No acute fracture. No primary bone lesion or focal pathologic process. Soft tissues and spinal canal: No prevertebral fluid or swelling. No visible canal hematoma. Disc levels: The spinal canal is fairly generous. No significant canal stenosis. 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 No significant bony foraminal stenosis. 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 Upper chest: The lung apices are grossly clear. Other: No neck mass, adenopathy or hematoma. IMPRESSION: 1. Stable age related cerebral atrophy, ventriculomegaly and periventricular white matter disease. 2. No acute intracranial findings or skull fracture. 3. Normal alignment of the cervical spine without acute fracture. 4. 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 5. 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 Electronically Signed   By: Rudie Meyer M.D.   On: 04/09/2023 16:47   CT Cervical Spine Wo Contrast  Result Date: 04/09/2023 CLINICAL DATA:  Larey Seat. EXAM: CT HEAD WITHOUT CONTRAST CT CERVICAL SPINE WITHOUT CONTRAST TECHNIQUE: Multidetector CT imaging of the head and cervical spine was performed following the standard protocol without intravenous contrast. Multiplanar CT image reconstructions of the cervical spine were also generated. RADIATION DOSE REDUCTION: This exam was performed according to the departmental dose-optimization program which includes automated exposure control, adjustment of the mA and/or kV according to  patient size and/or use of iterative reconstruction technique. COMPARISON:  CT scan 01/13/2023 FINDINGS: CT HEAD FINDINGS Brain: Stable age related cerebral atrophy, ventriculomegaly and periventricular white matter disease. No extra-axial fluid collections are identified. No CT findings for acute hemispheric infarction or intracranial hemorrhage. No mass lesions. The brainstem and cerebellum are normal. Vascular: Stable advanced atherosclerotic calcifications involving the major vascular structures but no aneurysm hyperdense vessels. Skull: No acute skull fracture bone lesions. Sinuses/Orbits: The paranasal sinuses and mastoid air cells are clear. The globes are intact. Other: No scalp lesions or scalp hematoma. CT CERVICAL SPINE FINDINGS Alignment: Normal Skull base and vertebrae: No acute fracture. No primary bone lesion or focal pathologic process. Soft tissues and spinal canal: No prevertebral fluid or swelling. No visible canal hematoma. Disc levels: The spinal canal is fairly generous. No significant canal stenosis. 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 No significant bony foraminal stenosis. 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 Upper chest: The lung apices are grossly clear. Other: No neck mass, adenopathy or hematoma. IMPRESSION: 1. Stable age related cerebral atrophy, ventriculomegaly and periventricular white matter disease. 2. No acute intracranial findings or skull fracture. 3. Normal alignment of the cervical spine without acute fracture. 4. 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 5.  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 Electronically Signed   By: Rudie Meyer M.D.   On: 04/09/2023 16:47   DG Hip Unilat W or Wo Pelvis 2-3 Views Left  Result Date: 04/09/2023 CLINICAL  DATA:  Pain after fall EXAM: DG HIP (WITH OR WITHOUT PELVIS) 3V LEFT COMPARISON:  Abdomen pelvis CT 01/30/2020 FINDINGS: No fracture or dislocation. Mild degenerative changes seen of the hip joints. There is a well corticated density overlying the posterior acetabulum of the left hip. This is stable going back to a CT scan of 01/30/2020 and could be sequela of old trauma. Degenerative changes of the lumbar spine. Overlapping cardiac leads. IMPRESSION: Degenerative changes. Electronically Signed   By: Karen Kays M.D.   On: 04/09/2023 16:40   DG Elbow Complete Right  Result Date: 04/09/2023 CLINICAL DATA:  Fall EXAM: RIGHT ELBOW - COMPLETE 3+ VIEW COMPARISON:  None Available. FINDINGS: There is no evidence of acute fracture or dislocation. Slight elevation of the anterior fat pad is equivocal for joint effusion. Joint spaces are maintained. Small posterior olecranon enthesophyte. Mild soft tissue swelling. IMPRESSION: 1. No evidence of acute fracture or dislocation. 2. Slight elevation of the anterior fat pad is equivocal for joint effusion. Electronically Signed   By: Duanne Guess D.O.   On: 04/09/2023 16:37   DG Chest Portable 1 View  Result Date: 04/09/2023 CLINICAL DATA:  Pain after fall EXAM: PORTABLE CHEST 1 VIEW COMPARISON:  X-ray 01/13/2023 and older FINDINGS: Status post median sternotomy. Enlarged cardiopericardial silhouette. Left chest pacemaker/defibrillator with leads along the right side of the heart. No consolidation, pneumothorax or effusion. No edema. Dense left midlung nodule. Stable from older exams. IMPRESSION: Postop chest.  Defibrillator. Enlarged heart. Stable dense left midlung nodule Electronically Signed   By: Karen Kays M.D.   On: 04/09/2023 16:31    Microbiology: Results for orders placed or performed during the hospital encounter of 09/13/22  Resp Panel by RT-PCR (Flu A&B, Covid) Anterior Nasal Swab     Status: None   Collection Time: 09/13/22  6:31 PM   Specimen:  Anterior Nasal Swab  Result Value Ref Range Status   SARS Coronavirus 2 by RT PCR NEGATIVE NEGATIVE Final    Comment: (NOTE) SARS-CoV-2 target nucleic acids are NOT DETECTED.  The SARS-CoV-2 RNA is generally detectable in upper respiratory specimens during the acute phase of infection. The lowest concentration of SARS-CoV-2 viral copies this assay can detect is 138 copies/mL. A negative result does not preclude SARS-Cov-2 infection and should not be used as the sole basis for treatment or other patient management decisions. A negative result may occur with  improper specimen collection/handling, submission of specimen other than nasopharyngeal swab, presence of viral mutation(s) within the areas targeted by this assay, and inadequate number of viral copies(<138 copies/mL). A negative result must be combined with clinical observations, patient history, and epidemiological information. The expected result is Negative.  Fact Sheet for Patients:  BloggerCourse.com  Fact Sheet for Healthcare Providers:  SeriousBroker.it  This test is no t yet approved or cleared by the [place] FDA and  has been authorized for detection and/or diagnosis of SARS-CoV-2 by FDA under an Emergency Use Authorization (EUA). This EUA will remain  in effect (meaning this test can be used) for the duration of the COVID-19 declaration under Section 564(b)(1) of the Act, 21 U.S.C.section 360bbb-3(b)(1), unless the authorization is terminated  or revoked sooner.       Influenza A by PCR  NEGATIVE NEGATIVE Final   Influenza B by PCR NEGATIVE NEGATIVE Final    Comment: (NOTE) The Xpert Xpress SARS-CoV-2/FLU/RSV plus assay is intended as an aid in the diagnosis of influenza from Nasopharyngeal swab specimens and should not be used as a sole basis for treatment. Nasal washings and aspirates are unacceptable for Xpert Xpress SARS-CoV-2/FLU/RSV testing.  Fact  Sheet for Patients: BloggerCourse.com  Fact Sheet for Healthcare Providers: SeriousBroker.it  This test is not yet approved or cleared by the [place] FDA and has been authorized for detection and/or diagnosis of SARS-CoV-2 by FDA under an Emergency Use Authorization (EUA). This EUA will remain in effect (meaning this test can be used) for the duration of the COVID-19 declaration under Section 564(b)(1) of the Act, 21 U.S.C. section 360bbb-3(b)(1), unless the authorization is terminated or revoked.  Performed at [facility], [address] [place], [place] [address]   Surgical pcr screen     Status: None   Collection Time: 09/20/22  4:42 AM   Specimen: Nasal Mucosa; Nasal Swab  Result Value Ref Range Status   MRSA, PCR NEGATIVE NEGATIVE Final   Staphylococcus aureus NEGATIVE NEGATIVE Final    Comment: (NOTE) The Xpert SA Assay (FDA approved for NASAL specimens in patients 73 years of age and older), is one component of a comprehensive surveillance program. It is not intended to diagnose infection nor to guide or monitor treatment. Performed at [facility], [address] [place], [place] [address]     Labs: CBC: Recent Labs  Lab 04/09/23 1541 04/09/23 1551 04/10/23 0226 04/10/23 1054 04/12/23 0100  WBC 3.9*  --  3.8* 3.8* 3.3*  NEUTROABS 2.7  --   --   --   --   HGB 10.8* 11.2* 9.4* 9.4* 8.7*  HCT 34.5* 33.0* 29.7* 30.4* 28.2*  MCV 81.6  --  80.5 81.5 81.3  PLT 205  --  163 174 160   Basic Metabolic Panel: Recent Labs  Lab 04/10/23 1054 04/10/23 1758 04/11/23 0224 04/12/23 0100 04/13/23 0132 04/14/23 0051  NA 142  --  141 141 139 142  K 3.0* 3.5 3.1* 3.7 3.3* 3.7  CL 106  --  107 108 104 103  CO2 25  --  GLUCOSE 95  --  88 102* 137* 80  BUN 19  --  24* 27* 31* 33*  CREATININE 1.97*  --  2.05* 2.13* 2.18* 1.92*  CALCIUM 9.0  --  8.8* 9.1 8.7* 9.3  MG  --  1.9  --   --   2.0 2.2   Liver Function Tests: Recent Labs  Lab 04/09/23 1541  AST 13*  ALT 10  ALKPHOS 79  BILITOT 1.0  PROT 6.7  ALBUMIN 3.7   CBG: No results for input(s): "GLUCAP" in the last 168 hours.  Discharge time spent: less than 30 minutes.  Signed: Hollice Espy, MD [facility] 04/14/2023

## 2023-10-03 ENCOUNTER — Other Ambulatory Visit: Payer: Self-pay

## 2023-10-03 DIAGNOSIS — I6523 Occlusion and stenosis of bilateral carotid arteries: Secondary | ICD-10-CM

## 2023-10-13 ENCOUNTER — Ambulatory Visit (HOSPITAL_COMMUNITY): Payer: Medicare Other

## 2023-10-13 ENCOUNTER — Ambulatory Visit: Payer: Medicare Other

## 2023-11-08 ENCOUNTER — Telehealth: Payer: Self-pay

## 2023-11-08 NOTE — Telephone Encounter (Signed)
Pt called to see if he can come off Plavix, as he states his PCP wanted him to check with vascular. Pt is on Plavix and Edoxaban and states this is what he has taken for the last year. He was due for 9 month f/u but did not schedule this. I have scheduled him off of his recall for carotid and APP appt and he will discuss the anticoagulants with APP at that appt. No further questions/concerns at this time.

## 2023-12-05 ENCOUNTER — Ambulatory Visit (HOSPITAL_COMMUNITY): Payer: No Typology Code available for payment source | Attending: Surgery

## 2023-12-05 ENCOUNTER — Ambulatory Visit: Payer: No Typology Code available for payment source

## 2024-01-27 ENCOUNTER — Ambulatory Visit (HOSPITAL_COMMUNITY): Payer: No Typology Code available for payment source

## 2024-01-27 ENCOUNTER — Ambulatory Visit: Payer: No Typology Code available for payment source

## 2024-03-05 ENCOUNTER — Ambulatory Visit (HOSPITAL_COMMUNITY): Payer: No Typology Code available for payment source | Attending: Physician Assistant

## 2024-03-05 ENCOUNTER — Ambulatory Visit: Payer: No Typology Code available for payment source

## 2024-09-19 DEATH — deceased
